# Patient Record
Sex: Male | Born: 1950 | Race: Black or African American | Hispanic: No | Smoking: Never smoker
Health system: Southern US, Community
[De-identification: ages and names within clinical notes are randomized; demographics above are authoritative.]

## PROBLEM LIST (undated history)

## (undated) DIAGNOSIS — K7689 Other specified diseases of liver: Secondary | ICD-10-CM

## (undated) DIAGNOSIS — D638 Anemia in other chronic diseases classified elsewhere: Secondary | ICD-10-CM

## (undated) DIAGNOSIS — I340 Nonrheumatic mitral (valve) insufficiency: Secondary | ICD-10-CM

## (undated) DIAGNOSIS — F1990 Other psychoactive substance use, unspecified, uncomplicated: Secondary | ICD-10-CM

## (undated) DIAGNOSIS — I5022 Chronic systolic (congestive) heart failure: Secondary | ICD-10-CM

## (undated) DIAGNOSIS — I513 Intracardiac thrombosis, not elsewhere classified: Secondary | ICD-10-CM

## (undated) DIAGNOSIS — Z91199 Patient's noncompliance with other medical treatment and regimen due to unspecified reason: Secondary | ICD-10-CM

## (undated) DIAGNOSIS — I480 Paroxysmal atrial fibrillation: Secondary | ICD-10-CM

## (undated) DIAGNOSIS — N186 End stage renal disease: Secondary | ICD-10-CM

## (undated) DIAGNOSIS — I10 Essential (primary) hypertension: Secondary | ICD-10-CM

## (undated) DIAGNOSIS — Z9119 Patient's noncompliance with other medical treatment and regimen: Secondary | ICD-10-CM

## (undated) DIAGNOSIS — E118 Type 2 diabetes mellitus with unspecified complications: Secondary | ICD-10-CM

## (undated) DIAGNOSIS — J189 Pneumonia, unspecified organism: Secondary | ICD-10-CM

## (undated) DIAGNOSIS — M1990 Unspecified osteoarthritis, unspecified site: Secondary | ICD-10-CM

## (undated) DIAGNOSIS — E785 Hyperlipidemia, unspecified: Secondary | ICD-10-CM

## (undated) DIAGNOSIS — Z992 Dependence on renal dialysis: Secondary | ICD-10-CM

## (undated) HISTORY — PX: COLONOSCOPY: SHX174

---

## 2014-06-08 ENCOUNTER — Emergency Department (HOSPITAL_COMMUNITY): Payer: Medicare HMO

## 2014-06-08 ENCOUNTER — Inpatient Hospital Stay (HOSPITAL_COMMUNITY): Payer: Medicare HMO

## 2014-06-08 ENCOUNTER — Inpatient Hospital Stay (HOSPITAL_COMMUNITY)
Admission: EM | Admit: 2014-06-08 | Discharge: 2014-06-28 | DRG: 264 | Disposition: A | Discharge status: Home / Self Care | Payer: Medicare HMO | Attending: Internal Medicine | Admitting: Internal Medicine

## 2014-06-08 ENCOUNTER — Encounter (HOSPITAL_COMMUNITY): Payer: Self-pay | Admitting: Emergency Medicine

## 2014-06-08 DIAGNOSIS — I482 Chronic atrial fibrillation: Secondary | ICD-10-CM | POA: Diagnosis present

## 2014-06-08 DIAGNOSIS — Z79899 Other long term (current) drug therapy: Secondary | ICD-10-CM | POA: Diagnosis not present

## 2014-06-08 DIAGNOSIS — I132 Hypertensive heart and chronic kidney disease with heart failure and with stage 5 chronic kidney disease, or end stage renal disease: Secondary | ICD-10-CM | POA: Diagnosis present

## 2014-06-08 DIAGNOSIS — I340 Nonrheumatic mitral (valve) insufficiency: Secondary | ICD-10-CM | POA: Diagnosis present

## 2014-06-08 DIAGNOSIS — I480 Paroxysmal atrial fibrillation: Secondary | ICD-10-CM | POA: Diagnosis present

## 2014-06-08 DIAGNOSIS — R7881 Bacteremia: Secondary | ICD-10-CM | POA: Diagnosis present

## 2014-06-08 DIAGNOSIS — N2581 Secondary hyperparathyroidism of renal origin: Secondary | ICD-10-CM | POA: Diagnosis present

## 2014-06-08 DIAGNOSIS — R570 Cardiogenic shock: Secondary | ICD-10-CM | POA: Diagnosis present

## 2014-06-08 DIAGNOSIS — N186 End stage renal disease: Secondary | ICD-10-CM | POA: Insufficient documentation

## 2014-06-08 DIAGNOSIS — N170 Acute kidney failure with tubular necrosis: Secondary | ICD-10-CM | POA: Diagnosis present

## 2014-06-08 DIAGNOSIS — E119 Type 2 diabetes mellitus without complications: Secondary | ICD-10-CM | POA: Diagnosis present

## 2014-06-08 DIAGNOSIS — J9601 Acute respiratory failure with hypoxia: Secondary | ICD-10-CM | POA: Diagnosis present

## 2014-06-08 DIAGNOSIS — F1410 Cocaine abuse, uncomplicated: Secondary | ICD-10-CM | POA: Diagnosis present

## 2014-06-08 DIAGNOSIS — R55 Syncope and collapse: Secondary | ICD-10-CM | POA: Diagnosis not present

## 2014-06-08 DIAGNOSIS — Z01818 Encounter for other preprocedural examination: Secondary | ICD-10-CM

## 2014-06-08 DIAGNOSIS — Y848 Other medical procedures as the cause of abnormal reaction of the patient, or of later complication, without mention of misadventure at the time of the procedure: Secondary | ICD-10-CM | POA: Diagnosis present

## 2014-06-08 DIAGNOSIS — D631 Anemia in chronic kidney disease: Secondary | ICD-10-CM | POA: Diagnosis present

## 2014-06-08 DIAGNOSIS — E875 Hyperkalemia: Secondary | ICD-10-CM

## 2014-06-08 DIAGNOSIS — E876 Hypokalemia: Secondary | ICD-10-CM | POA: Diagnosis present

## 2014-06-08 DIAGNOSIS — T80211A Bloodstream infection due to central venous catheter, initial encounter: Secondary | ICD-10-CM | POA: Diagnosis present

## 2014-06-08 DIAGNOSIS — M109 Gout, unspecified: Secondary | ICD-10-CM | POA: Diagnosis present

## 2014-06-08 DIAGNOSIS — I420 Dilated cardiomyopathy: Secondary | ICD-10-CM | POA: Diagnosis present

## 2014-06-08 DIAGNOSIS — I5023 Acute on chronic systolic (congestive) heart failure: Secondary | ICD-10-CM | POA: Diagnosis present

## 2014-06-08 DIAGNOSIS — I240 Acute coronary thrombosis not resulting in myocardial infarction: Secondary | ICD-10-CM | POA: Diagnosis present

## 2014-06-08 DIAGNOSIS — Z9289 Personal history of other medical treatment: Secondary | ICD-10-CM

## 2014-06-08 DIAGNOSIS — E785 Hyperlipidemia, unspecified: Secondary | ICD-10-CM | POA: Diagnosis present

## 2014-06-08 DIAGNOSIS — Z7901 Long term (current) use of anticoagulants: Secondary | ICD-10-CM | POA: Diagnosis not present

## 2014-06-08 DIAGNOSIS — Z992 Dependence on renal dialysis: Secondary | ICD-10-CM

## 2014-06-08 DIAGNOSIS — B9689 Other specified bacterial agents as the cause of diseases classified elsewhere: Secondary | ICD-10-CM | POA: Diagnosis present

## 2014-06-08 DIAGNOSIS — R001 Bradycardia, unspecified: Secondary | ICD-10-CM | POA: Diagnosis present

## 2014-06-08 DIAGNOSIS — E871 Hypo-osmolality and hyponatremia: Secondary | ICD-10-CM | POA: Diagnosis not present

## 2014-06-08 DIAGNOSIS — J811 Chronic pulmonary edema: Secondary | ICD-10-CM

## 2014-06-08 DIAGNOSIS — J9600 Acute respiratory failure, unspecified whether with hypoxia or hypercapnia: Secondary | ICD-10-CM

## 2014-06-08 DIAGNOSIS — T827XXA Infection and inflammatory reaction due to other cardiac and vascular devices, implants and grafts, initial encounter: Secondary | ICD-10-CM

## 2014-06-08 DIAGNOSIS — J9690 Respiratory failure, unspecified, unspecified whether with hypoxia or hypercapnia: Secondary | ICD-10-CM | POA: Diagnosis present

## 2014-06-08 DIAGNOSIS — R0602 Shortness of breath: Secondary | ICD-10-CM | POA: Diagnosis present

## 2014-06-08 DIAGNOSIS — N179 Acute kidney failure, unspecified: Secondary | ICD-10-CM | POA: Insufficient documentation

## 2014-06-08 DIAGNOSIS — Z113 Encounter for screening for infections with a predominantly sexual mode of transmission: Secondary | ICD-10-CM | POA: Insufficient documentation

## 2014-06-08 DIAGNOSIS — N189 Chronic kidney disease, unspecified: Secondary | ICD-10-CM

## 2014-06-08 DIAGNOSIS — IMO0001 Reserved for inherently not codable concepts without codable children: Secondary | ICD-10-CM | POA: Insufficient documentation

## 2014-06-08 HISTORY — DX: Chronic systolic (congestive) heart failure: I50.22

## 2014-06-08 HISTORY — DX: Nonrheumatic mitral (valve) insufficiency: I34.0

## 2014-06-08 HISTORY — DX: Other psychoactive substance use, unspecified, uncomplicated: F19.90

## 2014-06-08 HISTORY — DX: Hyperlipidemia, unspecified: E78.5

## 2014-06-08 HISTORY — DX: Intracardiac thrombosis, not elsewhere classified: I51.3

## 2014-06-08 HISTORY — DX: Essential (primary) hypertension: I10

## 2014-06-08 LAB — PROTIME-INR
INR: 4.58 — ABNORMAL HIGH (ref 0.00–1.49)
INR: 4.75 — ABNORMAL HIGH (ref 0.00–1.49)
INR: 2.79 — ABNORMAL HIGH (ref 0.00–1.49)
PROTHROMBIN TIME: 29.6 s — AB (ref 11.6–15.2)
Prothrombin Time: 43.7 seconds — ABNORMAL HIGH (ref 11.6–15.2)
Prothrombin Time: 44.9 seconds — ABNORMAL HIGH (ref 11.6–15.2)

## 2014-06-08 LAB — I-STAT CHEM 8, ED
BUN: 84 mg/dL — AB (ref 6–23)
CREATININE: 4.6 mg/dL — AB (ref 0.50–1.35)
Calcium, Ion: 1 mmol/L — ABNORMAL LOW (ref 1.13–1.30)
Chloride: 99 mEq/L (ref 96–112)
GLUCOSE: 143 mg/dL — AB (ref 70–99)
HCT: 46 % (ref 39.0–52.0)
Hemoglobin: 15.6 g/dL (ref 13.0–17.0)
Potassium: 6.8 mEq/L (ref 3.7–5.3)
SODIUM: 129 meq/L — AB (ref 137–147)
TCO2: 16 mmol/L (ref 0–100)

## 2014-06-08 LAB — CBC WITH DIFFERENTIAL/PLATELET
Basophils Absolute: 0 10*3/uL (ref 0.0–0.1)
Basophils Relative: 0 % (ref 0–1)
Eosinophils Absolute: 0 10*3/uL (ref 0.0–0.7)
Eosinophils Relative: 0 % (ref 0–5)
HEMATOCRIT: 38.6 % — AB (ref 39.0–52.0)
HEMOGLOBIN: 12 g/dL — AB (ref 13.0–17.0)
LYMPHS ABS: 0.5 10*3/uL — AB (ref 0.7–4.0)
LYMPHS PCT: 7 % — AB (ref 12–46)
MCH: 24.5 pg — ABNORMAL LOW (ref 26.0–34.0)
MCHC: 31.1 g/dL (ref 30.0–36.0)
MCV: 78.8 fL (ref 78.0–100.0)
MONO ABS: 0.6 10*3/uL (ref 0.1–1.0)
MONOS PCT: 8 % (ref 3–12)
NEUTROS ABS: 6.2 10*3/uL (ref 1.7–7.7)
Neutrophils Relative %: 85 % — ABNORMAL HIGH (ref 43–77)
Platelets: 178 10*3/uL (ref 150–400)
RBC: 4.9 MIL/uL (ref 4.22–5.81)
RDW: 20.8 % — AB (ref 11.5–15.5)
WBC: 7.3 10*3/uL (ref 4.0–10.5)

## 2014-06-08 LAB — RENAL FUNCTION PANEL
ALBUMIN: 3.1 g/dL — AB (ref 3.5–5.2)
Albumin: 3.2 g/dL — ABNORMAL LOW (ref 3.5–5.2)
Anion gap: 20 — ABNORMAL HIGH (ref 5–15)
Anion gap: 15 (ref 5–15)
BUN: 85 mg/dL — AB (ref 6–23)
BUN: 65 mg/dL — ABNORMAL HIGH (ref 6–23)
CHLORIDE: 92 meq/L — AB (ref 96–112)
CHLORIDE: 94 meq/L — AB (ref 96–112)
CO2: 19 mEq/L (ref 19–32)
CO2: 22 mEq/L (ref 19–32)
CREATININE: 3.51 mg/dL — AB (ref 0.50–1.35)
Calcium: 10.1 mg/dL (ref 8.4–10.5)
Calcium: 9.5 mg/dL (ref 8.4–10.5)
Creatinine, Ser: 4.44 mg/dL — ABNORMAL HIGH (ref 0.50–1.35)
GFR calc Af Amer: 20 mL/min — ABNORMAL LOW (ref >90)
GFR calc non Af Amer: 13 mL/min — ABNORMAL LOW (ref >90)
GFR, EST AFRICAN AMERICAN: 15 mL/min — AB (ref >90)
GFR, EST NON AFRICAN AMERICAN: 17 mL/min — AB (ref >90)
GLUCOSE: 182 mg/dL — AB (ref 70–99)
Glucose, Bld: 175 mg/dL — ABNORMAL HIGH (ref 70–99)
PHOSPHORUS: 3.8 mg/dL (ref 2.3–4.6)
POTASSIUM: 6.3 meq/L — AB (ref 3.7–5.3)
Phosphorus: 5.2 mg/dL — ABNORMAL HIGH (ref 2.3–4.6)
Potassium: 4.9 mEq/L (ref 3.7–5.3)
SODIUM: 131 meq/L — AB (ref 137–147)
Sodium: 131 mEq/L — ABNORMAL LOW (ref 137–147)

## 2014-06-08 LAB — URINALYSIS, ROUTINE W REFLEX MICROSCOPIC
Glucose, UA: NEGATIVE mg/dL
Ketones, ur: 15 mg/dL — AB
NITRITE: NEGATIVE
PH: 5 (ref 5.0–8.0)
Protein, ur: 30 mg/dL — AB
Specific Gravity, Urine: 1.016 (ref 1.005–1.030)
UROBILINOGEN UA: 2 mg/dL — AB (ref 0.0–1.0)

## 2014-06-08 LAB — MAGNESIUM
MAGNESIUM: 1.8 mg/dL (ref 1.5–2.5)
Magnesium: 1.8 mg/dL (ref 1.5–2.5)

## 2014-06-08 LAB — COMPREHENSIVE METABOLIC PANEL
ALK PHOS: 95 U/L (ref 39–117)
ALT: 66 U/L — ABNORMAL HIGH (ref 0–53)
ALT: 76 U/L — ABNORMAL HIGH (ref 0–53)
ANION GAP: 29 — AB (ref 5–15)
AST: 85 U/L — ABNORMAL HIGH (ref 0–37)
AST: 98 U/L — AB (ref 0–37)
Albumin: 3.4 g/dL — ABNORMAL LOW (ref 3.5–5.2)
Albumin: 3.3 g/dL — ABNORMAL LOW (ref 3.5–5.2)
Alkaline Phosphatase: 94 U/L (ref 39–117)
Anion gap: 30 — ABNORMAL HIGH (ref 5–15)
BILIRUBIN TOTAL: 3.9 mg/dL — AB (ref 0.3–1.2)
BUN: 88 mg/dL — AB (ref 6–23)
BUN: 87 mg/dL — ABNORMAL HIGH (ref 6–23)
CHLORIDE: 88 meq/L — AB (ref 96–112)
CHLORIDE: 89 meq/L — AB (ref 96–112)
CO2: 13 meq/L — AB (ref 19–32)
CO2: 11 meq/L — AB (ref 19–32)
CREATININE: 4.4 mg/dL — AB (ref 0.50–1.35)
Calcium: 9.7 mg/dL (ref 8.4–10.5)
Calcium: 10.8 mg/dL — ABNORMAL HIGH (ref 8.4–10.5)
Creatinine, Ser: 4.36 mg/dL — ABNORMAL HIGH (ref 0.50–1.35)
GFR, EST AFRICAN AMERICAN: 15 mL/min — AB (ref >90)
GFR, EST AFRICAN AMERICAN: 15 mL/min — AB (ref >90)
GFR, EST NON AFRICAN AMERICAN: 13 mL/min — AB (ref >90)
GFR, EST NON AFRICAN AMERICAN: 13 mL/min — AB (ref >90)
GLUCOSE: 147 mg/dL — AB (ref 70–99)
GLUCOSE: 199 mg/dL — AB (ref 70–99)
POTASSIUM: 7.1 meq/L — AB (ref 3.7–5.3)
Potassium: 7 mEq/L (ref 3.7–5.3)
Sodium: 130 mEq/L — ABNORMAL LOW (ref 137–147)
Sodium: 130 mEq/L — ABNORMAL LOW (ref 137–147)
Total Bilirubin: 3.8 mg/dL — ABNORMAL HIGH (ref 0.3–1.2)
Total Protein: 8.6 g/dL — ABNORMAL HIGH (ref 6.0–8.3)
Total Protein: 8.4 g/dL — ABNORMAL HIGH (ref 6.0–8.3)

## 2014-06-08 LAB — BLOOD GAS, ARTERIAL
Acid-base deficit: 9.5 mmol/L — ABNORMAL HIGH (ref 0.0–2.0)
BICARBONATE: 15.8 meq/L — AB (ref 20.0–24.0)
Drawn by: 406621
FIO2: 0.6 %
MECHVT: 620 mL
O2 Saturation: 99.1 %
PEEP/CPAP: 5 cmH2O
PH ART: 7.298 — AB (ref 7.350–7.450)
PO2 ART: 197 mmHg — AB (ref 80.0–100.0)
Patient temperature: 97.5
RATE: 16 resp/min
TCO2: 16.8 mmol/L (ref 0–100)
pCO2 arterial: 32.9 mmHg — ABNORMAL LOW (ref 35.0–45.0)

## 2014-06-08 LAB — TYPE AND SCREEN
ABO/RH(D): A POS
Antibody Screen: NEGATIVE
UNIT DIVISION: 0
Unit division: 0
Unit division: 0
Unit division: 0

## 2014-06-08 LAB — CBC
HEMATOCRIT: 36.9 % — AB (ref 39.0–52.0)
Hemoglobin: 11.6 g/dL — ABNORMAL LOW (ref 13.0–17.0)
MCH: 24.1 pg — AB (ref 26.0–34.0)
MCHC: 31.4 g/dL (ref 30.0–36.0)
MCV: 76.6 fL — ABNORMAL LOW (ref 78.0–100.0)
Platelets: 202 10*3/uL (ref 150–400)
RBC: 4.82 MIL/uL (ref 4.22–5.81)
RDW: 20.2 % — AB (ref 11.5–15.5)
WBC: 7.2 10*3/uL (ref 4.0–10.5)

## 2014-06-08 LAB — PRO B NATRIURETIC PEPTIDE: Pro B Natriuretic peptide (BNP): 11335 pg/mL — ABNORMAL HIGH (ref 0–125)

## 2014-06-08 LAB — MRSA PCR SCREENING: MRSA by PCR: NEGATIVE

## 2014-06-08 LAB — I-STAT TROPONIN, ED: Troponin i, poc: 0.03 ng/mL (ref 0.00–0.08)

## 2014-06-08 LAB — TROPONIN I: Troponin I: <0.3 ng/mL (ref <0.30)

## 2014-06-08 LAB — POCT ACTIVATED CLOTTING TIME
ACTIVATED CLOTTING TIME: 135 s
Activated Clotting Time: 146 seconds
Activated Clotting Time: 163 seconds
Activated Clotting Time: 157 seconds
Activated Clotting Time: 152 seconds
Activated Clotting Time: 163 seconds
Activated Clotting Time: 157 seconds
Activated Clotting Time: 168 seconds

## 2014-06-08 LAB — CBG MONITORING, ED
Glucose-Capillary: 154 mg/dL — ABNORMAL HIGH (ref 70–99)
Glucose-Capillary: 185 mg/dL — ABNORMAL HIGH (ref 70–99)

## 2014-06-08 LAB — LACTIC ACID, PLASMA: LACTIC ACID, VENOUS: 10.7 mmol/L — AB (ref 0.5–2.2)

## 2014-06-08 LAB — CARBOXYHEMOGLOBIN
Carboxyhemoglobin: 1.1 % (ref 0.5–1.5)
METHEMOGLOBIN: 1 % (ref 0.0–1.5)
O2 Saturation: 49.1 %
TOTAL HEMOGLOBIN: 12 g/dL — AB (ref 13.5–18.0)

## 2014-06-08 LAB — URINE MICROSCOPIC-ADD ON

## 2014-06-08 LAB — APTT: APTT: 43 s — AB (ref 24–37)

## 2014-06-08 LAB — ABO/RH: ABO/RH(D): A POS

## 2014-06-08 LAB — GLUCOSE, CAPILLARY
Glucose-Capillary: 159 mg/dL — ABNORMAL HIGH (ref 70–99)
Glucose-Capillary: 172 mg/dL — ABNORMAL HIGH (ref 70–99)

## 2014-06-08 LAB — PHOSPHORUS: Phosphorus: 5.5 mg/dL — ABNORMAL HIGH (ref 2.3–4.6)

## 2014-06-08 MED ORDER — SUCCINYLCHOLINE CHLORIDE 20 MG/ML IJ SOLN
INTRAMUSCULAR | Status: AC
  Filled 2014-06-08: qty 1

## 2014-06-08 MED ORDER — MIDAZOLAM HCL 2 MG/2ML IJ SOLN
1.0000 mg | INTRAMUSCULAR | Status: DC | PRN
  Administered 2014-06-08: 1 mg via INTRAVENOUS

## 2014-06-08 MED ORDER — CALCIUM CHLORIDE 10 % IV SOLN
INTRAVENOUS | Status: AC
  Administered 2014-06-08: 1000 mg
  Filled 2014-06-08: qty 10

## 2014-06-08 MED ORDER — ROCURONIUM BROMIDE 50 MG/5ML IV SOLN
INTRAVENOUS | Status: AC
  Filled 2014-06-08: qty 2

## 2014-06-08 MED ORDER — INSULIN ASPART 100 UNIT/ML ~~LOC~~ SOLN
10.0000 [IU] | Freq: Once | SUBCUTANEOUS | Status: AC
  Administered 2014-06-08: 10 [IU] via INTRAVENOUS
  Filled 2014-06-08: qty 1

## 2014-06-08 MED ORDER — PRISMASOL BGK 0/2.5 32-2.5 MEQ/L IV SOLN
INTRAVENOUS | Status: DC
  Administered 2014-06-08 – 2014-06-09 (×6): via INTRAVENOUS_CENTRAL
  Filled 2014-06-08 (×11): qty 5000

## 2014-06-08 MED ORDER — FUROSEMIDE 10 MG/ML IJ SOLN
160.0000 mg | Freq: Once | INTRAVENOUS | Status: AC
  Administered 2014-06-08: 160 mg via INTRAVENOUS
  Filled 2014-06-08: qty 16

## 2014-06-08 MED ORDER — LIDOCAINE HCL (CARDIAC) 20 MG/ML IV SOLN
INTRAVENOUS | Status: AC
  Filled 2014-06-08: qty 5

## 2014-06-08 MED ORDER — ETOMIDATE 2 MG/ML IV SOLN
INTRAVENOUS | Status: AC | PRN
  Administered 2014-06-08: 20 mg via INTRAVENOUS

## 2014-06-08 MED ORDER — VECURONIUM BROMIDE 10 MG IV SOLR
INTRAVENOUS | Status: AC
  Administered 2014-06-08: 5 mg
  Filled 2014-06-08: qty 10

## 2014-06-08 MED ORDER — SODIUM CHLORIDE 0.9 % IV SOLN
25.0000 ug/h | INTRAVENOUS | Status: DC
  Administered 2014-06-09: 200 ug/h via INTRAVENOUS
  Administered 2014-06-10: 100 ug/h via INTRAVENOUS
  Filled 2014-06-08 (×3): qty 50

## 2014-06-08 MED ORDER — PRISMASOL BGK 0/2.5 32-2.5 MEQ/L IV SOLN
INTRAVENOUS | Status: DC
  Administered 2014-06-08 (×2): via INTRAVENOUS_CENTRAL
  Filled 2014-06-08 (×6): qty 5000

## 2014-06-08 MED ORDER — CHLORHEXIDINE GLUCONATE 0.12 % MT SOLN
15.0000 mL | Freq: Two times a day (BID) | OROMUCOSAL | Status: DC
  Administered 2014-06-08 – 2014-06-09 (×3): 15 mL via OROMUCOSAL
  Filled 2014-06-08 (×3): qty 15

## 2014-06-08 MED ORDER — SODIUM POLYSTYRENE SULFONATE 15 GM/60ML PO SUSP
30.0000 g | Freq: Once | ORAL | Status: AC
  Administered 2014-06-08: 30 g via ORAL
  Filled 2014-06-08: qty 120

## 2014-06-08 MED ORDER — SODIUM CHLORIDE 0.9 % IV SOLN
1.0000 mg/h | INTRAVENOUS | Status: DC
  Administered 2014-06-08 (×2): 2 mg/h via INTRAVENOUS
  Filled 2014-06-08 (×2): qty 10

## 2014-06-08 MED ORDER — CALCIUM CHLORIDE 10 % IV SOLN
INTRAVENOUS | Status: AC | PRN
  Administered 2014-06-08: 1 g via INTRAVENOUS

## 2014-06-08 MED ORDER — DOBUTAMINE IN D5W 4-5 MG/ML-% IV SOLN: 2.5000 ug/kg/min | INTRAVENOUS | Status: DC

## 2014-06-08 MED ORDER — DOBUTAMINE IN D5W 4-5 MG/ML-% IV SOLN
5.0000 ug/kg/min | INTRAVENOUS | Status: DC
  Administered 2014-06-08 – 2014-06-09 (×2): 5 ug/kg/min via INTRAVENOUS
  Filled 2014-06-08 (×2): qty 250

## 2014-06-08 MED ORDER — NEPRO/CARBSTEADY PO LIQD
1000.0000 mL | ORAL | Status: DC
  Administered 2014-06-08: 1000 mL via ORAL
  Filled 2014-06-08 (×3): qty 1000

## 2014-06-08 MED ORDER — PRO-STAT SUGAR FREE PO LIQD
60.0000 mL | Freq: Four times a day (QID) | ORAL | Status: DC
  Administered 2014-06-08 – 2014-06-09 (×6): 60 mL
  Filled 2014-06-08 (×11): qty 60

## 2014-06-08 MED ORDER — SODIUM CHLORIDE 0.9 % IV SOLN
10.0000 mL/h | Freq: Once | INTRAVENOUS | Status: AC
  Administered 2014-06-08: 10 mL/h via INTRAVENOUS

## 2014-06-08 MED ORDER — INSULIN ASPART 100 UNIT/ML ~~LOC~~ SOLN
2.0000 [IU] | SUBCUTANEOUS | Status: DC
  Administered 2014-06-08 – 2014-06-09 (×3): 4 [IU] via SUBCUTANEOUS
  Administered 2014-06-09: 2 [IU] via SUBCUTANEOUS
  Administered 2014-06-09 (×2): 4 [IU] via SUBCUTANEOUS
  Administered 2014-06-09: 2 [IU] via SUBCUTANEOUS
  Administered 2014-06-09 – 2014-06-10 (×2): 4 [IU] via SUBCUTANEOUS
  Administered 2014-06-10 (×2): 2 [IU] via SUBCUTANEOUS
  Administered 2014-06-10 – 2014-06-11 (×3): 4 [IU] via SUBCUTANEOUS
  Administered 2014-06-11 (×2): 2 [IU] via SUBCUTANEOUS
  Administered 2014-06-12: 4 [IU] via SUBCUTANEOUS
  Administered 2014-06-12: 6 [IU] via SUBCUTANEOUS
  Administered 2014-06-12 – 2014-06-14 (×6): 2 [IU] via SUBCUTANEOUS
  Administered 2014-06-14 (×2): 4 [IU] via SUBCUTANEOUS
  Administered 2014-06-14: 2 [IU] via SUBCUTANEOUS
  Administered 2014-06-14: 4 [IU] via SUBCUTANEOUS
  Administered 2014-06-14: 2 [IU] via SUBCUTANEOUS
  Administered 2014-06-15 – 2014-06-16 (×5): 4 [IU] via SUBCUTANEOUS
  Administered 2014-06-17 (×2): 2 [IU] via SUBCUTANEOUS
  Administered 2014-06-17: 6 [IU] via SUBCUTANEOUS

## 2014-06-08 MED ORDER — MIDAZOLAM HCL 2 MG/2ML IJ SOLN
INTRAMUSCULAR | Status: AC
  Filled 2014-06-08: qty 4

## 2014-06-08 MED ORDER — FUROSEMIDE 10 MG/ML IJ SOLN
160.0000 mg | Freq: Four times a day (QID) | INTRAVENOUS | Status: AC
  Administered 2014-06-08 – 2014-06-10 (×6): 160 mg via INTRAVENOUS
  Filled 2014-06-08 (×7): qty 16

## 2014-06-08 MED ORDER — FENTANYL CITRATE 0.05 MG/ML IJ SOLN
50.0000 ug | Freq: Once | INTRAMUSCULAR | Status: AC
  Administered 2014-06-08: 50 ug via INTRAVENOUS
  Filled 2014-06-08: qty 2

## 2014-06-08 MED ORDER — SODIUM CHLORIDE 0.9 % IJ SOLN
250.0000 [IU]/h | INTRAMUSCULAR | Status: DC
  Administered 2014-06-08: 450 [IU]/h via INTRAVENOUS_CENTRAL
  Administered 2014-06-09 (×2): 1700 [IU]/h via INTRAVENOUS_CENTRAL
  Administered 2014-06-09: 1650 [IU]/h via INTRAVENOUS_CENTRAL
  Administered 2014-06-09: 1350 [IU]/h via INTRAVENOUS_CENTRAL
  Administered 2014-06-10: 1650 [IU]/h via INTRAVENOUS_CENTRAL
  Administered 2014-06-10: 1700 [IU]/h via INTRAVENOUS_CENTRAL
  Administered 2014-06-10: 1450 [IU]/h via INTRAVENOUS_CENTRAL
  Administered 2014-06-10: 1700 [IU]/h via INTRAVENOUS_CENTRAL
  Administered 2014-06-11 (×4): 1350 [IU]/h via INTRAVENOUS_CENTRAL
  Administered 2014-06-12 (×2): 1250 [IU]/h via INTRAVENOUS_CENTRAL
  Administered 2014-06-12: 1300 [IU]/h via INTRAVENOUS_CENTRAL
  Administered 2014-06-13: 1350 [IU]/h via INTRAVENOUS_CENTRAL
  Administered 2014-06-13: 950 [IU]/h via INTRAVENOUS_CENTRAL
  Administered 2014-06-13: 1350 [IU]/h via INTRAVENOUS_CENTRAL
  Administered 2014-06-14 – 2014-06-15 (×2): 550 [IU]/h via INTRAVENOUS_CENTRAL
  Administered 2014-06-16: 600 [IU]/h via INTRAVENOUS_CENTRAL
  Administered 2014-06-17: 650 [IU]/h via INTRAVENOUS_CENTRAL
  Administered 2014-06-17: 600 [IU]/h via INTRAVENOUS_CENTRAL
  Administered 2014-06-18: 650 [IU]/h via INTRAVENOUS_CENTRAL
  Administered 2014-06-18: 1450 [IU]/h via INTRAVENOUS_CENTRAL
  Administered 2014-06-19: 1600 [IU]/h via INTRAVENOUS_CENTRAL
  Administered 2014-06-19: 850 [IU]/h via INTRAVENOUS_CENTRAL
  Administered 2014-06-19: 1250 [IU]/h via INTRAVENOUS_CENTRAL
  Filled 2014-06-08 (×31): qty 2

## 2014-06-08 MED ORDER — ROCURONIUM BROMIDE 50 MG/5ML IV SOLN
INTRAVENOUS | Status: AC | PRN
  Administered 2014-06-08: 50 mg via INTRAVENOUS

## 2014-06-08 MED ORDER — PRISMASOL BGK 0/2.5 32-2.5 MEQ/L IV SOLN
INTRAVENOUS | Status: DC
  Administered 2014-06-08 – 2014-06-09 (×3): via INTRAVENOUS_CENTRAL
  Filled 2014-06-08 (×6): qty 5000

## 2014-06-08 MED ORDER — CETYLPYRIDINIUM CHLORIDE 0.05 % MT LIQD
7.0000 mL | Freq: Four times a day (QID) | OROMUCOSAL | Status: DC
  Administered 2014-06-09 – 2014-06-10 (×8): 7 mL via OROMUCOSAL

## 2014-06-08 MED ORDER — HEPARIN SODIUM (PORCINE) 1000 UNIT/ML DIALYSIS
1000.0000 [IU] | INTRAMUSCULAR | Status: DC | PRN
  Administered 2014-06-08: 3200 [IU] via INTRAVENOUS_CENTRAL
  Filled 2014-06-08: qty 4
  Filled 2014-06-08: qty 6

## 2014-06-08 MED ORDER — FENTANYL CITRATE 0.05 MG/ML IJ SOLN
INTRAMUSCULAR | Status: AC | PRN
  Administered 2014-06-08: 100 ug via INTRAVENOUS

## 2014-06-08 MED ORDER — MIDAZOLAM HCL 2 MG/2ML IJ SOLN: 1.0000 mg | INTRAMUSCULAR | Status: DC | PRN

## 2014-06-08 MED ORDER — FENTANYL CITRATE 0.05 MG/ML IJ SOLN
0.0000 ug/h | INTRAMUSCULAR | Status: DC
  Administered 2014-06-08: 50 ug/h via INTRAVENOUS
  Filled 2014-06-08: qty 50

## 2014-06-08 MED ORDER — PANTOPRAZOLE SODIUM 40 MG IV SOLR
40.0000 mg | INTRAVENOUS | Status: DC
  Administered 2014-06-08: 40 mg via INTRAVENOUS
  Filled 2014-06-08: qty 40

## 2014-06-08 MED ORDER — DEXTROSE 50 % IV SOLN
1.0000 | Freq: Once | INTRAVENOUS | Status: AC
  Administered 2014-06-08: 50 mL via INTRAVENOUS
  Filled 2014-06-08: qty 50

## 2014-06-08 MED ORDER — FUROSEMIDE 10 MG/ML IJ SOLN
INTRAMUSCULAR | Status: AC
  Administered 2014-06-08: 80 mg
  Filled 2014-06-08: qty 8

## 2014-06-08 MED ORDER — FENTANYL BOLUS VIA INFUSION
25.0000 ug | INTRAVENOUS | Status: DC | PRN
  Administered 2014-06-08 – 2014-06-09 (×4): 50 ug via INTRAVENOUS
  Filled 2014-06-08: qty 50

## 2014-06-08 MED ORDER — FENTANYL CITRATE 0.05 MG/ML IJ SOLN
INTRAMUSCULAR | Status: AC
  Administered 2014-06-08: 50 ug via INTRAVENOUS
  Filled 2014-06-08: qty 2

## 2014-06-08 MED ORDER — ETOMIDATE 2 MG/ML IV SOLN
INTRAVENOUS | Status: AC
  Filled 2014-06-08: qty 20

## 2014-06-08 MED ORDER — SODIUM CHLORIDE 0.9 % IV SOLN
1.0000 g | Freq: Once | INTRAVENOUS | Status: DC
  Filled 2014-06-08: qty 10

## 2014-06-08 MED ORDER — HEPARIN BOLUS VIA INFUSION (CRRT)
1000.0000 [IU] | INTRAVENOUS | Status: DC | PRN
  Filled 2014-06-08 (×2): qty 1000

## 2014-06-08 MED ORDER — SODIUM CHLORIDE 0.9 % FOR CRRT
INTRAVENOUS_CENTRAL | Status: DC | PRN
  Filled 2014-06-08: qty 1000

## 2014-06-08 MED ORDER — MIDAZOLAM HCL 5 MG/5ML IJ SOLN
INTRAMUSCULAR | Status: AC | PRN
  Administered 2014-06-08: 2 mg via INTRAVENOUS

## 2014-06-08 NOTE — ED Notes (Signed)
MD at bedside. 

## 2014-06-08 NOTE — Progress Notes (Signed)
INITIAL NUTRITION ASSESSMENT  DOCUMENTATION CODES Per approved criteria  -Obesity Unspecified   INTERVENTION: Initiate TF via OGT with Nepro at 25 ml/h and Prostat 60 ml 4 times daily on day 1; on day 2, increase to goal rate of 25 ml/h (600 ml per day) to provide 1880 kcals, 169 gm protein, 436 ml free water daily.  NUTRITION DIAGNOSIS: Inadequate oral intake related to inability to eat as evidenced by NPO.   Goal: Enteral nutrition to provide 60-70% of estimated calorie needs (22-25 kcals/kg ideal body weight) and 100% of estimated protein needs, based on ASPEN guidelines for hypocaloric, high protein feeding in critically ill obese individuals  Monitor:  TF tolerance/initiation/ advancement, respiratory status, labs, weight changes, I/O's  Reason for Assessment: VDRF, Consult for TF initiation and mangement  63 y.o. male  Admitting Dx: <principal problem not specified>  75 year old with end stage CHF with EF of 15% presenting with hyperkalemia, pulmonary edema, respiratory failure and renal failure. Unable to speak in full sentences. Minimal history. Presents with SOB, respiratory failure and severe anasarca.  ASSESSMENT: Pt admitted with VDRF due to pulmonary edema. HD catheter to be placed to start HD.  Unable to complete nutrition focused physical exam at this time. MD and RN currently at bedside. Family does not desire long term life support. Pt with hx of cocaine abuse.  Patient is currently intubated on ventilator support. OGT in place.  MV: 9.2 L/min Temp (24hrs), Avg:97.5 F (36.4 C), Min:97.4 F (36.3 C), Max:97.7 F (36.5 C)  Labs reviewed. Na: 130, K: 7.0, Cl: 89, CO2: 11, BUN/Creat: 87/4.40, Ca: 10.8, Phos: 5.5, Glucose:199, CBGs: 154-185. Mg WDL.   Height: Ht Readings from Last 1 Encounters:  06/08/14 6' (1.829 m)    Weight: Wt Readings from Last 1 Encounters:  06/08/14 280 lb (127.007 kg)    Ideal Body Weight: 178# (80.9 kg)  % Ideal Body Weight:  157%  Wt Readings from Last 10 Encounters:  06/08/14 280 lb (127.007 kg)    Usual Body Weight: unknown  % Usual Body Weight: n/a  BMI:  Body mass index is 37.97 kg/(m^2). Obesity, class II.   Estimated Nutritional Needs: Kcal: 2095.3 Kcals per MASPEN Underfeeding Protocol: 1517-6160 Protein: >162 grams Fluid: >1.8 L  Skin: Intact  Diet Order: NPO  EDUCATION NEEDS: -Education not appropriate at this time   Intake/Output Summary (Last 24 hours) at 06/08/14 1358 Last data filed at 06/08/14 1355  Gross per 24 hour  Intake    617 ml  Output      0 ml  Net    617 ml    Last BM: PTA  Labs:   Recent Labs Lab 06/08/14 1040 06/08/14 1121 06/08/14 1216  NA 130* 129* 130*  K 7.1* 6.8* 7.0*  CL 88* 99 89*  CO2 13*  --  11*  BUN 88* 84* 87*  CREATININE 4.36* 4.60* 4.40*  CALCIUM 9.7  --  10.8*  MG  --   --  1.8  PHOS  --   --  5.5*  GLUCOSE 147* 143* 199*    CBG (last 3)   Recent Labs  06/08/14 1013 06/08/14 1250  GLUCAP 154* 185*   Scheduled Meds: . sodium chloride  10 mL/hr Intravenous Once  . calcium gluconate 1 GM IV  1 g Intravenous Once  . furosemide  160 mg Intravenous Once  . furosemide  160 mg Intravenous Q6H  . insulin aspart  2-6 Units Subcutaneous 6 times per day  .  lidocaine (cardiac) 100 mg/415ml      . pantoprazole (PROTONIX) IV  40 mg Intravenous Q24H  . succinylcholine        Continuous Infusions: . DOBUTamine    . fentaNYL infusion INTRAVENOUS    . heparin 10,000 units/ 20 mL infusion syringe    . midazolam (VERSED) infusion 2 mg/hr (06/08/14 1341)  . dialysis replacement fluid (prismasate)    . dialysis replacement fluid (prismasate)    . dialysate [facility](PRISMASATE)      Past Medical History  Diagnosis Date  . Diabetes mellitus without complication   . Chronic systolic heart failure     a) ECHO [facility] 02/2014): EF <15%, multiple apical thrombi, RV mod enlarged, mod MR b. [facility]: RA 16, RV 64/9 (15), PA 64/32 (42),  PCWP: 24, CI: 1.4  . HTN (hypertension)   . HLD (hyperlipidemia)   . CKD (chronic kidney disease), stage III   . Apical mural thrombus   . Mitral regurgitation   . Drug use     cocaine  . Atrial fibrillation, chronic     History reviewed. No pertinent past surgical history.  Icie Kuznicki A. Mayford KnifeWilliams, RD, LDN Pager: [phone] After hours Pager: [phone]

## 2014-06-08 NOTE — ED Notes (Signed)
Dr. Clarise Cruz at bedside for eval

## 2014-06-08 NOTE — ED Notes (Signed)
Pt became dizzy and pulling leads and oxygen off, md wong called to bedside.

## 2014-06-08 NOTE — Consult Note (Signed)
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 Maintained on dobutamine 675mcg/kg/min.   Past medical h/o is notable for CKD with baseline Cr 3.0, cocaine abuse, DM2, AF on amio, VT. He has not been candidate for advanced therapies due to cocaine abuse (quit 4 months ago) and renal failure. Started on dobutamine. [place] to stay off cocaine.  On Oct 14 [facility]. CR was 3.3 (on 04/16/2014 Cr [facility]) and potassium was low. Kcl was increased. Over past week increased dyspnea and edema. NYHA IIIB sym;toms at baseline.  On arrival to ER was markedly dyspneic and with wide complex bradycardia. K 6.8 and Cr 4.6. Given IV calcium and IV lasix. CCM called and he ws intubated.      Review of Systems: [y] = yes, [ ]  = no   General: Weight gain [ y]; Weight loss [ ] ; Anorexia [ ] ; Fatigue[ y]; Fever [ ] ; Chills [ ] ; Weakness [ ]   Cardiac: Chest pain/pressure [ ] ; Resting SOB Cove.Etienne[y ]; Exertional SOB Cove.Etienne[y ]; Orthopnea Cove.Etienne[y ]; Pedal Edema [ y]; Palpitations [ ] ; Syncope [ ] ; Presyncope [ ] ; Paroxysmal nocturnal dyspnea[ ]   Pulmonary: Cough Cove.Etienne[y ]; Wheezing[ ] ; Hemoptysis[ ] ; Sputum [ ] ; Snoring [ ]   GI: Vomiting[ ] ; Dysphagia[ ] ; Melena[ ] ; Hematochezia [ ] ; Heartburn[ ] ; Abdominal pain Cove.Etienne[y ]; Constipation [ ] ; Diarrhea [ ] ; BRBPR [ ]   GU: Hematuria[ ] ; Dysuria [ ] ; Nocturia[ ]   Vascular: Pain in legs with walking [ ] ; Pain in feet with lying flat [ ] ; Non-healing sores [ ] ; Stroke [ ] ; TIA [ ] ; Slurred speech [ ] ;  Neuro: Headaches[ ] ; Vertigo[ ] ; Seizures[ ] ; Paresthesias[ ] ;Blurred vision [ ] ; Diplopia [ ] ; Vision changes [ ]   Ortho/Skin: Arthritis Cove.Etienne[y ]; Joint pain Cove.Etienne[y ]; Muscle pain [ ] ; Joint swelling [ ] ; Back Pain [ ] ; Rash [ ]   Psych: Depression[ ] ; Anxiety[ ]   Heme: Bleeding  problems [ ] ; Clotting disorders [ ] ; Anemia [ ]   Endocrine: Diabetes Cove.Etienne[y ]; Thyroid dysfunction[ ]   Home Medications Prior to Admission medications   Not on File    Past Medical History: Past Medical History  Diagnosis Date  . Diabetes mellitus without complication   . Chronic systolic heart failure     a) [facility]CHO [facility] 02/2014): EF <15%, multiple apical thrombi, RV mod enlarged, mod MR b. [facility]: RA 16, RV 64/9 (15), PA 64/32 (42), PCWP: 24, CI: 1.4  . HTN (hypertension)   . HLD (hyperlipidemia)   . CKD (chronic kidney disease), stage III   . Apical mural thrombus   . Mitral regurgitation   . Drug use     cocaine  . Atrial fibrillation, chronic   History reviewed. No pertinent family history. No FHx of premature CAD or HF.   Social History: History   Social History  . Marital Status: Divorced    Spouse Name: N/A    Number of Children: N/A  . Years of Education: N/A   Social History Main Topics  . Smoking status: Unknown If Ever Smoked  . Smokeless tobacco: None  . Alcohol Use: None  . Drug Use: Yes     Comment: hx cocaine use  . Sexual Activity: None  Other Topics Concern  . None   Social History Narrative   Respiratory therapist    Allergies:  No Known Allergies  Objective:    Vital Signs:   Temp:  [97.4 F (36.3 C)] 97.4 F (36.3 C) (10/30 0957) Pulse Rate:  [88-106] 95 (10/30 1130) Resp:  [17-32] 22 (10/30 1130) BP: (70-134)/(55-98) 112/98 mmHg (10/30 1130) SpO2:  [98 %-100 %] 99 % (10/30 1130) Weight:  [127.007 kg (280 lb)] 127.007 kg (280 lb) (10/30 1002)    Weight change: Filed Weights   06/08/14 1002  Weight: 127.007 kg (280 lb)    Intake/Output:  No intake or output data in the 24 hours ending 06/08/14 1156   Physical Exam: General:  Chronically ill appearing. Severe dyspnea using accessory muscles to breathe HEENT: normal Neck: supple. JVP 10 . Carotids 2+ bilat; no bruits. No lymphadenopathy or thryomegaly  appreciated. Cor: PMI laterally displaced. Regular rate & rhythm. 2/6 MR +s3 Lungs: + craclkes Abdomen: soft, nontender, + distended. No hepatosplenomegaly. No bruits or masses. Good bowel sounds. Extremities: no cyanosis, clubbing, rash, 2+ edema Neuro: alert & orientedx3, cranial nerves grossly intact. moves all 4 extremities w/o difficulty. Affect pleasant  Telemetry: ?SR with wide complex brady  Labs: Basic Metabolic Panel:  Recent Labs Lab 06/08/14 1040 06/08/14 1121  NA 130* 129*  K 7.1* 6.8*  CL 88* 99  CO2 13*  --   GLUCOSE 147* 143*  BUN 88* 84*  CREATININE 4.36* 4.60*  CALCIUM 9.7  --     Liver Function Tests:  Recent Labs Lab 06/08/14 1040  AST 85*  ALT 66*  ALKPHOS 95  BILITOT 3.8*  PROT 8.6*  ALBUMIN 3.4*   No results found for this basename: LIPASE, AMYLASE,  in the last 168 hours No results found for this basename: AMMONIA,  in the last 168 hours  CBC:  Recent Labs Lab 06/08/14 1040 06/08/14 1121  WBC 7.2  --   HGB 11.6* 15.6  HCT 36.9* 46.0  MCV 76.6*  --   PLT 202  --     Cardiac Enzymes:  Recent Labs Lab 06/08/14 1040  TROPONINI <0.30    BNP: BNP (last 3 results)  Recent Labs  06/08/14 1040  PROBNP 11335.0*    CBG:  Recent Labs Lab 06/08/14 1013  GLUCAP 154*    Coagulation Studies:  Recent Labs  06/08/14 1040  LABPROT 43.7*  INR 4.58*    Other results: EKG: ?SR with wide complex bradycardia c/w hyperkalemia  Imaging: Dg Chest Port 1 View  06/08/2014   CLINICAL DATA:  Shortness of Breath beginning last night with nausea starting this morning. Initial encounter.  EXAM: PORTABLE CHEST - 1 VIEW  COMPARISON:  None.  FINDINGS: 1023 hrs. Right PICC line tip projects at innominate vein confluence. Lungs are clear without edema or focal airspace consolidation. No pleural effusion. The cardio pericardial silhouette is enlarged. Imaged bony structures of the thorax are intact. Telemetry leads overlie the chest.   IMPRESSION: Enlargement of the cardiopericardial silhouette without edema or focal airspace consolidation.   Electronically Signed   By: Kennith Center M.D.   On: 06/08/2014 10:59     Medications:     Current Medications: . etomidate      . fentaNYL      . lidocaine (cardiac) 100 mg/47ml      . midazolam      . rocuronium      . sodium polystyrene  30 g Oral Once  . succinylcholine  Infusions: . calcium gluconate 1 GM IV    . DOBUTamine       Assessment:   1. Acute respiratory failure 2. A/c systolic HF due to NICM EF  < 74% 3. Acute on chronic renal failure stage IV 4. Hyperkalemia 5. Cardiogenic shock 6. DM2 7. Cocaine abuse in remission 3-4 months 8. H/o gout 9. Supratherapeutic INR  10. LV thrombus 11. PAF on amio 12. H/o VT   Plan/Discussion:    He is critically ill with acute renal and respiratory failure in the setting of advanced HF/cardiogenic shock. This is complicated by severe hyperkalemia. He has not been a candidate for advanced cardiac therapies due to renal failure and cocaine abuse.   I discussed his situation with him with Dr. Molli Knock at bedside as well. Options are very limited as he cannot get advanced therapies and wouldn't be candidate for outpatient HD. For now he wants to be aggressive and is ok with intubation and CVVHD short-term. Would not want long-term intubation, PEG, trach or frequent shocks.  Will treat hyperkalemia with IV calciumt, kayexalate, insulin D50. CCM and Renal have been notified.   I discussed case with his primary cardiologist, Dr. Lupita Shutter at [facility] who agrees.  The patient is critically ill with multiple organ systems failure and requires high complexity decision making for assessment and support, frequent evaluation and titration of therapies, application of advanced monitoring technologies and extensive interpretation of multiple databases.   Critical Care Time devoted to patient care services described in this note  is 60 Minutes.   Length of Stay: 0  Arvilla Meres MD 06/08/2014, 11:56 AM  Advanced Heart Failure Team Pager [phone] (M-F; 7a - 4p)  Please contact [facility] for night-coverage after hours (4p -7a ) and weekends on [email or web address]

## 2014-06-08 NOTE — Procedures (Signed)
Hemodialysis Insertion Procedure Note Soul Companion [id number] 26-Nov-1950  Procedure: Insertion of Hemodialysis Catheter Type: 3 port  Indications: Hemodialysis   Procedure Details Consent: Risks of procedure as well as the alternatives and risks of each were explained to the (patient/caregiver).  Consent for procedure obtained. Time Out: Verified patient identification, verified procedure, site/side was marked, verified correct patient position, special equipment/implants available, medications/allergies/relevent history reviewed, required imaging and test results available.  Performed  Maximum sterile technique was used including antiseptics, cap, gloves, gown, hand hygiene, mask and sheet. Skin prep: Chlorhexidine; local anesthetic administered A antimicrobial bonded/coated triple lumen catheter was placed in the right femoral vein due to patient being a dialysis patient using the Seldinger technique. Ultrasound guidance used.Yes.   Catheter placed to 20 cm. Blood aspirated via all 3 ports and then flushed x 3. Line sutured x 2 and dressing applied.  Evaluation Blood flow good Complications: No apparent complications Patient did tolerate procedure well. Chest X-ray ordered to verify placement.  CXR: pending.  Brett Canales Minor ACNP Adolph Pollack PCCM Pager [phone] till 3 pm If no answer page [phone] 06/08/2014, 2:49 PM  I was present for and supervised the entire procedure  Billy Fischer, MD ; [facility] service Mobile [phone].  After 5:30 PM or weekends, call [phone]

## 2014-06-08 NOTE — Progress Notes (Signed)
ANTICOAGULATION CONSULT NOTE - Initial Consult  Pharmacy Consult for heparin Indication: atrial fibrillation and apical thrombus  No Known Allergies  Patient Measurements: Height: 6' (182.9 cm) Weight: 280 lb (127.007 kg) IBW/kg (Calculated) : 77.6 Heparin Dosing Weight: 92kg  Vital Signs: Temp: 97.2 F (36.2 C) (10/30 1420) Temp Source: Axillary (10/30 1420) BP: 114/68 mmHg (10/30 1420) Pulse Rate: 98 (10/30 1420)  Labs:  Recent Labs  06/08/14 1040 06/08/14 1121 06/08/14 1216  HGB 11.6* 15.6 12.0*  HCT 36.9* 46.0 38.6*  PLT 202  --  178  APTT  --   --  43*  LABPROT 43.7*  --  44.9*  INR 4.58*  --  4.75*  CREATININE 4.36* 4.60* 4.40*  TROPONINI <0.30  --   --     Estimated Creatinine Clearance: 23.1 ml/min (by C-G formula based on Cr of 4.4).   Medical History: Past Medical History  Diagnosis Date  . Diabetes mellitus without complication   . Chronic systolic heart failure     a) ECHO [facility] 02/2014): EF <15%, multiple apical thrombi, RV mod enlarged, mod MR b. RHC (02/21/14) at [facility]: RA 16, RV 64/9 (15), PA 64/32 (42), PCWP: 24, CI: 1.4  . HTN (hypertension)   . HLD (hyperlipidemia)   . CKD (chronic kidney disease), stage III   . Apical mural thrombus   . Mitral regurgitation   . Drug use     cocaine  . Atrial fibrillation, chronic    Assessment: 63 year old male with chronic heart failure on home dobutamine presents to [facility] with increasing shortness of breath. ECG shows very fine VT due to hyperkalemia (7.1).   Patient has history of afib and apical thombus on coumadin pta. INR 4.58 on admit up to 4.7 a few hours later. CCM consulted pharmacy to transition to heparin gtt once INR down and HD cath placed. Will plan on rechecking INR later tonight and restart heparin when appropriate.  Goal of Therapy:  INR 2-3 Heparin level 0.3-0.7 units/ml Monitor platelets by anticoagulation protocol: Yes   Plan:  Recheck INR this evening Start heparin when  INR<2  Sheppard Coil PharmD., BCPS Clinical Pharmacist Pager [phone] 06/08/2014 2:52 PM

## 2014-06-08 NOTE — ED Notes (Signed)
Pt here with SOB and N,V fort a few days. Pt on a drip for his heart. Pt pale and hypotensive at triage. Actively dry heaving. Pt speaking in short sentences.

## 2014-06-08 NOTE — Procedures (Signed)
Intubation Procedure Note [name] [id number] 07-22-1951  Procedure: Intubation Indications: Respiratory insufficiency  Procedure Details Consent: Risks of procedure as well as the alternatives and risks of each were explained to the (patient/caregiver).  Consent for procedure obtained. Time Out: Verified patient identification, verified procedure, site/side was marked, verified correct patient position, special equipment/implants available, medications/allergies/relevent history reviewed, required imaging and test results available.  Performed  Maximum sterile technique was used including gloves, hand hygiene and mask.  MAC    Evaluation Hemodynamic Status: BP stable throughout; O2 sats: stable throughout Patient's Current Condition: stable Complications: No apparent complications Patient did tolerate procedure well. Chest X-ray ordered to verify placement.  CXR: pending.   [name] 06/08/2014

## 2014-06-08 NOTE — ED Notes (Addendum)
Pt here from triage, complains of "convulsions" (while being alert and oriented, denies seizures) and sob. Pt is on dobutamine

## 2014-06-08 NOTE — ED Notes (Signed)
1118 dr bensimohn at bedside,

## 2014-06-08 NOTE — H&P (Signed)
PULMONARY / CRITICAL CARE MEDICINE   Name: [name] MRN: [id number] DOB: 07-21-1951    ADMISSION DATE:  06/08/2014 CONSULTATION DATE:  06/08/2014  REFERRING MD :  EDP  CHIEF COMPLAINT:  Resp failure and CHF  INITIAL PRESENTATION: 63 year old with end stage CHF with EF of 15% presenting with hyperkalemia, pulmonary edema, respiratory failure and renal failure.  Unable to speak in full sentences.  Minimal history.  Presents with SOB, respiratory failure and severe anasarca.  STUDIES:    SIGNIFICANT EVENTS: 10/30 VDRF due to pulmonary edema  PAST MEDICAL HISTORY :   has a past medical history of Diabetes mellitus without complication; Chronic systolic heart failure; HTN (hypertension); HLD (hyperlipidemia); CKD (chronic kidney disease), stage III; Apical mural thrombus; Mitral regurgitation; Drug use; and Atrial fibrillation, chronic.  has no past surgical history on file. Prior to Admission medications   Not on File   No Known Allergies  FAMILY HISTORY:  has no family status information on file.  SOCIAL HISTORY:  reports that he uses illicit drugs.  REVIEW OF SYSTEMS:  Unable to obtain due to respiratory distress.  SUBJECTIVE:   VITAL SIGNS: Temp:  [97.4 F (36.3 C)] 97.4 F (36.3 C) (10/30 0957) Pulse Rate:  [88-106] 95 (10/30 1130) Resp:  [17-32] 22 (10/30 1130) BP: (70-134)/(55-98) 112/98 mmHg (10/30 1130) SpO2:  [98 %-100 %] 99 % (10/30 1130) Weight:  [127.007 kg (280 lb)] 127.007 kg (280 lb) (10/30 1002) HEMODYNAMICS:   VENTILATOR SETTINGS:   INTAKE / OUTPUT: No intake or output data in the 24 hours ending 06/08/14 1156  PHYSICAL EXAMINATION: General:  Chronically ill appearing male in severe respiratory distress. Neuro:  Awake and interactive, moves all ext to command. HEENT:  [place]/AT, PERRL, EOM-I and MMM. Cardiovascular:  Regular, tachy, Nl S1/S2, -M/R/G. Lungs:  Diffuse rales. Abdomen:  Soft, NT, ND and +BS. Musculoskeletal:  2+ edema  bilaterally. Skin:  Intact.  LABS:  CBC  Recent Labs Lab 06/08/14 1040 06/08/14 1121  WBC 7.2  --   HGB 11.6* 15.6  HCT 36.9* 46.0  PLT 202  --    Coag's  Recent Labs Lab 06/08/14 1040  INR 4.58*   BMET  Recent Labs Lab 06/08/14 1040 06/08/14 1121  NA 130* 129*  K 7.1* 6.8*  CL 88* 99  CO2 13*  --   BUN 88* 84*  CREATININE 4.36* 4.60*  GLUCOSE 147* 143*   Electrolytes  Recent Labs Lab 06/08/14 1040  CALCIUM 9.7   Sepsis Markers No results found for this basename: LATICACIDVEN, PROCALCITON, O2SATVEN,  in the last 168 hours ABG No results found for this basename: PHART, PCO2ART, PO2ART,  in the last 168 hours Liver Enzymes  Recent Labs Lab 06/08/14 1040  AST 85*  ALT 66*  ALKPHOS 95  BILITOT 3.8*  ALBUMIN 3.4*   Cardiac Enzymes  Recent Labs Lab 06/08/14 1040  TROPONINI <0.30  PROBNP 11335.0*   Glucose  Recent Labs Lab 06/08/14 1013  GLUCAP 154*    Imaging No results found.   ASSESSMENT / PLAN:  PULMONARY OETT 10/30>>> A: VDRD due to pulmonary edema, acute. P:   - Intubate - Full vent support - F/U ABG - F/U CXR - Hold weaning til volume negative.  CARDIOVASCULAR CVL R PICC line (old)>>> A: Heart failure, chronically on dobutamine, end stage. P:  - Dobutamine at 5. - HD for hyperkalemia and volume - Further recommendations per cards. - Restart heparin after HD catheter is in  RENAL A:  Acute on chronic renal failure. P:   - Place HD catheter  - Renal service called. - CVVH.  GASTROINTESTINAL A:  No active issues P:   - TF - Protonix  HEMATOLOGIC A:  INR elevated due to coumadin P:  - Emergent release FFP transfusion - Once HD catheter is placed will start heparin per pharmacy  INFECTIOUS A:  No active issues. P:   - Monitor  ENDOCRINE A:  DM   P:   - ISS - Cortisol level  NEUROLOGIC A:  Sedation post tube P:   RASS goal: 0 Fentanyl drip Versed drip   FAMILY  - Updates: Spoke with  patient, full code for now, no trach/peg.  TODAY'S SUMMARY: Sedate, intubate, full support, CRRT per renal.  I have personally obtained a history, examined the patient, evaluated laboratory and imaging results, formulated the assessment and plan and placed orders.  CRITICAL CARE: The patient is critically ill with multiple organ systems failure and requires high complexity decision making for assessment and support, frequent evaluation and titration of therapies, application of advanced monitoring technologies and extensive interpretation of multiple databases. Critical Care Time devoted to patient care services described in this note is 45 minutes.   [name] G. [name], M.D. [facility]/Critical Care Medicine. Pager: [phone]. After hours pager: [phone].  06/08/2014, 11:56 AM

## 2014-06-08 NOTE — ED Notes (Signed)
Family at bedside. 

## 2014-06-08 NOTE — ED Notes (Signed)
MD at bedside. Dr Deterding

## 2014-06-08 NOTE — ED Notes (Addendum)
Called report to [employer]

## 2014-06-08 NOTE — Consult Note (Signed)
Reason for Consult:CKD4/AKI/^ K Referring Physician: Dr. Nathanial Millman [name] is an 63 y.o. male.  HPI: 63 yr male with hx DM, HTN,^ lipids,HTN, hx Afib, MR, apical thrombus, EF 10-15% followed at [facility].  Hx CKD with most recent Cr on 10/14 of 3.4 . Most of Sept in mid 2s.  K was low wk ago so po K ^.  Hx cocaine abuse.  Retired Scientist, clinical (histocompatibility and immunogenetics).  On chronic Dobutamine.         Came to ER with severe dyspnea, SOB which has been progressive.  Cr 4.36, K 7.1 and wide complex tachcardia.  Entub here.          Not clear has ever seen Nephrology.  Review of systems not obtained due to patient factors.  Past Medical History  Diagnosis Date  . Diabetes mellitus without complication   . Chronic systolic heart failure     a) ECHO [facility] 02/2014): EF <15%, multiple apical thrombi, RV mod enlarged, mod MR b. RHC (02/21/14) at [facility]: RA 16, RV 64/9 (15), PA 64/32 (42), PCWP: 24, CI: 1.4  . HTN (hypertension)   . HLD (hyperlipidemia)   . CKD (chronic kidney disease), stage III   . Apical mural thrombus   . Mitral regurgitation   . Drug use     cocaine  . Atrial fibrillation, chronic     History reviewed. No pertinent past surgical history.  History reviewed. No pertinent family history.  Social History:  reports that he uses illicit drugs. His tobacco and alcohol histories are not on file.  Allergies: No Known Allergies  Medications: I have reviewed the patient's current medications. Prior to Admission:  (Not in a hospital admission)   Results for orders placed during the hospital encounter of 06/08/14 (from the past 48 hour(s))  CBG MONITORING, ED     Status: Abnormal   Collection Time    06/08/14 10:13 AM      Result Value Ref Range   Glucose-Capillary 154 (*) 70 - 99 mg/dL   Comment 1 Documented in Chart     Comment 2 Notify RN    TROPONIN I     Status: None   Collection Time    06/08/14 10:40 AM      Result Value Ref Range   Troponin I <0.30  <0.30 ng/mL   Comment:             Due to the release kinetics of cTnI,     a negative result within the first hours     of the onset of symptoms does not rule out     myocardial infarction with certainty.     If myocardial infarction is still suspected,     repeat the test at appropriate intervals.  CBC     Status: Abnormal   Collection Time    06/08/14 10:40 AM      Result Value Ref Range   WBC 7.2  4.0 - 10.5 K/uL   RBC 4.82  4.22 - 5.81 MIL/uL   Hemoglobin 11.6 (*) 13.0 - 17.0 g/dL   HCT 36.9 (*) 39.0 - 52.0 %   MCV 76.6 (*) 78.0 - 100.0 fL   MCH 24.1 (*) 26.0 - 34.0 pg   MCHC 31.4  30.0 - 36.0 g/dL   RDW 20.2 (*) 11.5 - 15.5 %   Platelets 202  150 - 400 K/uL  PRO B NATRIURETIC PEPTIDE     Status: Abnormal   Collection Time    06/08/14  10:40 AM      Result Value Ref Range   Pro B Natriuretic peptide (BNP) 11335.0 (*) 0 - 125 pg/mL  PROTIME-INR     Status: Abnormal   Collection Time    06/08/14 10:40 AM      Result Value Ref Range   Prothrombin Time 43.7 (*) 11.6 - 15.2 seconds   INR 4.58 (*) 0.00 - 1.49  COMPREHENSIVE METABOLIC PANEL     Status: Abnormal   Collection Time    06/08/14 10:40 AM      Result Value Ref Range   Sodium 130 (*) 137 - 147 mEq/L   Potassium 7.1 (*) 3.7 - 5.3 mEq/L   Comment: NO VISIBLE HEMOLYSIS     CRITICAL RESULT CALLED TO, READ BACK BY AND VERIFIED WITH:     M.BROWN,RN 06/08/14 1132 BY BSLADE   Chloride 88 (*) 96 - 112 mEq/L   CO2 13 (*) 19 - 32 mEq/L   Glucose, Bld 147 (*) 70 - 99 mg/dL   BUN 88 (*) 6 - 23 mg/dL   Creatinine, Ser 4.36 (*) 0.50 - 1.35 mg/dL   Calcium 9.7  8.4 - 10.5 mg/dL   Total Protein 8.6 (*) 6.0 - 8.3 g/dL   Albumin 3.4 (*) 3.5 - 5.2 g/dL   AST 85 (*) 0 - 37 U/L   ALT 66 (*) 0 - 53 U/L   Alkaline Phosphatase 95  39 - 117 U/L   Total Bilirubin 3.8 (*) 0.3 - 1.2 mg/dL   GFR calc non Af Amer 13 (*) >90 mL/min   GFR calc Af Amer 15 (*) >90 mL/min   Comment: (NOTE)     The eGFR has been calculated using the CKD EPI equation.     This calculation has  not been validated in all clinical situations.     eGFR's persistently <90 mL/min signify possible Chronic Kidney     Disease.   Anion gap 29 (*) 5 - 15  I-STAT TROPOININ, ED     Status: None   Collection Time    06/08/14 10:51 AM      Result Value Ref Range   Troponin i, poc 0.03  0.00 - 0.08 ng/mL   Comment 3            Comment: Due to the release kinetics of cTnI,     a negative result within the first hours     of the onset of symptoms does not rule out     myocardial infarction with certainty.     If myocardial infarction is still suspected,     repeat the test at appropriate intervals.  I-STAT CHEM 8, ED     Status: Abnormal   Collection Time    06/08/14 11:21 AM      Result Value Ref Range   Sodium 129 (*) 137 - 147 mEq/L   Potassium 6.8 (*) 3.7 - 5.3 mEq/L   Chloride 99  96 - 112 mEq/L   BUN 84 (*) 6 - 23 mg/dL   Creatinine, Ser 4.60 (*) 0.50 - 1.35 mg/dL   Glucose, Bld 143 (*) 70 - 99 mg/dL   Calcium, Ion 1.00 (*) 1.13 - 1.30 mmol/L   TCO2 16  0 - 100 mmol/L   Hemoglobin 15.6  13.0 - 17.0 g/dL   HCT 46.0  39.0 - 52.0 %   Comment NOTIFIED PHYSICIAN    TYPE AND SCREEN     Status: None   Collection Time  06/08/14 11:42 AM      Result Value Ref Range   ABO/RH(D) PENDING     Antibody Screen PENDING     Sample Expiration 06/11/2014     Unit Number [id number]     Blood Component Type RBC LR PHER2     Unit division 00     Status of Unit ISSUED     Unit tag comment VERBAL ORDERS PER DR GENTRY     Transfusion Status OK TO TRANSFUSE     Crossmatch Result PENDING     Unit Number [id number]     Blood Component Type RBC LR PHER1     Unit division 00     Status of Unit ISSUED     Unit tag comment VERBAL ORDERS PER DR GENTRY     Transfusion Status OK TO TRANSFUSE     Crossmatch Result PENDING     Unit Number [id number]     Blood Component Type RBC LR PHER1     Unit division 00     Status of Unit ISSUED     Unit tag comment VERBAL ORDERS PER DR GENTRY      Transfusion Status OK TO TRANSFUSE     Crossmatch Result PENDING     Unit Number [id number]     Blood Component Type RED CELLS,LR     Unit division 00     Status of Unit ISSUED     Unit tag comment VERBAL ORDERS PER DR GENTRY     Transfusion Status OK TO TRANSFUSE     Crossmatch Result PENDING    PREPARE FRESH FROZEN PLASMA     Status: None   Collection Time    06/08/14 11:42 AM      Result Value Ref Range   Unit Number [id number]     Blood Component Type LIQ PLASMA     Unit division 00     Status of Unit ISSUED     Unit tag comment VERBAL ORDERS PER DR GENTRY     Transfusion Status OK TO TRANSFUSE     Unit Number [id number]     Blood Component Type LIQ PLASMA     Unit division 00     Status of Unit ISSUED     Unit tag comment VERBAL ORDERS PER DR GENTRY     Transfusion Status OK TO TRANSFUSE    CARBOXYHEMOGLOBIN     Status: Abnormal   Collection Time    06/08/14 11:45 AM      Result Value Ref Range   Total hemoglobin 12.0 (*) 13.5 - 18.0 g/dL   O2 Saturation 49.1     Carboxyhemoglobin 1.1  0.5 - 1.5 %   Methemoglobin 1.0  0.0 - 1.5 %  PREPARE FRESH FROZEN PLASMA     Status: None   Collection Time    06/08/14 12:24 PM      Result Value Ref Range   Unit Number [id number]     Blood Component Type THW PLS APHR     Unit division C0     Status of Unit REL FROM [facility]     Unit tag comment VERBAL ORDERS PER DR GENTRY     Transfusion Status OK TO TRANSFUSE     Unit Number [id number]     Blood Component Type THAWED PLASMA     Unit division 00     Status of Unit REL FROM [facility]     Unit tag comment VERBAL ORDERS PER DR Colin Rhein  Transfusion Status OK TO TRANSFUSE     Unit Number [id number]     Blood Component Type LIQ PLASMA     Unit division 00     Status of Unit ISSUED     Unit tag comment VERBAL ORDERS PER DR GENTRY     Transfusion Status OK TO TRANSFUSE     Unit Number [id number]     Blood Component Type THW PLS APHR     Unit division B0      Status of Unit ISSUED     Unit tag comment VERBAL ORDERS PER DR GENTRY     Transfusion Status OK TO TRANSFUSE      Dg Chest Port 1 View  06/08/2014   CLINICAL DATA:  Shortness of Breath beginning last night with nausea starting this morning. Initial encounter.  EXAM: PORTABLE CHEST - 1 VIEW  COMPARISON:  None.  FINDINGS: 1023 hrs. Right PICC [name] tip projects at innominate vein confluence. Lungs are clear without edema or focal airspace consolidation. No pleural effusion. The cardio pericardial silhouette is enlarged. Imaged bony structures of the thorax are intact. Telemetry leads overlie the chest.  IMPRESSION: Enlargement of the cardiopericardial silhouette without edema or focal airspace consolidation.   Electronically Signed   By: Misty Stanley M.D.   On: 06/08/2014 10:59    ROS Blood pressure 118/105, pulse 112, temperature 97.4 F (36.3 C), resp. rate 16, height 6' (1.829 m), weight 127.007 kg (280 lb), SpO2 100.00%. Physical Exam Physical Examination: General appearance - entub not responsive, paralyzed, obese  Mental status - as above Eyes - pupils not reactive, cannot see fundi Mouth - mucous membranes moist, pharynx normal without lesions Neck - adenopathy noted PCL Lymphatics - posterior cervical nodes Chest - rales noted bilat, rhonchi noted bilat Heart - tachy at 120s, wide complex Gr2/6 M, 2+ edema Abdomen - soft, liver down 7 cm, bs pos but decreased Neurological - as above, paralyzed, pupils not reactive no gag now Skin - dry, scaling mild  Assessment/Plan: 1 CKD 4 presumably DM vs cardiorenal vs other 2 AKI  Cardiac, ? Other drugs, need hx.  Now ^ K,vol xs, will need CRRT to tx K then vol.  Tx low bicarb with iv and CRRT.  Needs some reversal of anticoag for access. 3 Hypertension: not an issue 4. Anemia mild 5. Metabolic Bone Disease: not assessed 6 CM poor outlook, cont DB 7 DM monitor P CRRT, kayex, Ca, bicarb, insulin, glu, U/S , UA  [name]  L 06/08/2014, 12:36 PM

## 2014-06-08 NOTE — ED Notes (Addendum)
Pt is on home dobutamine drip at 9 ml, hr

## 2014-06-08 NOTE — ED Provider Notes (Signed)
CSN: [id number]     Arrival date & time 06/08/14  6962 History   First MD Initiated Contact with Patient 06/08/14 1001     Chief Complaint  Patient presents with  . Shortness of Breath  . Nausea     (Consider location/radiation/quality/duration/timing/severity/associated sxs/prior Treatment) Patient is a 63 y.o. male presenting with shortness of breath. The history is provided by the patient. No language interpreter was used.  Shortness of Breath Severity:  Severe Onset quality:  Gradual Duration:  3 days Timing:  Constant Progression:  Worsening Chronicity:  Recurrent Context: not URI   Relieved by:  Rest Worsened by:  Exertion Ineffective treatments:  None tried Associated symptoms: PND   Associated symptoms: no abdominal pain, no chest pain, no cough, no fever, no headaches, no rash, no sore throat, no syncope and no vomiting   Risk factors: no hx of PE/DVT   Risk factors comment:  Severe dilated cardiomyopathy, HTN, AF RVR, polysubstance abuse   Past Medical History  Diagnosis Date  . Diabetes mellitus without complication   . CHF (congestive heart failure)   . Gout    History reviewed. No pertinent past surgical history. History reviewed. No pertinent family history. History  Substance Use Topics  . Smoking status: Never Smoker   . Smokeless tobacco: Not on file  . Alcohol Use: No    Review of Systems  Constitutional: Negative for fever.  HENT: Negative for congestion, rhinorrhea and sore throat.   Respiratory: Negative for cough and shortness of breath.   Cardiovascular: Positive for PND. Negative for chest pain and syncope.  Gastrointestinal: Negative for nausea, vomiting, abdominal pain and diarrhea.  Genitourinary: Negative for dysuria and hematuria.  Skin: Negative for rash.  Neurological: Negative for syncope, light-headedness and headaches.  All other systems reviewed and are negative.     Allergies  Review of patient's allergies indicates no  known allergies.  Home Medications   Prior to Admission medications   Not on File   BP 134/78  Pulse 106  Temp(Src) 97.4 F (36.3 C)  Resp 21  Ht 6' (1.829 m)  Wt 280 lb (127.007 kg)  BMI 37.97 kg/m2  SpO2 100% Physical Exam  Nursing note and vitals reviewed. Constitutional: He is oriented to person, place, and time. He appears well-developed and well-nourished.  HENT:  Head: Normocephalic and atraumatic.  Right Ear: External ear normal.  Left Ear: External ear normal.  Eyes: EOM are normal.  Neck: Normal range of motion. Neck supple.  Cardiovascular: Normal rate, regular rhythm and intact distal pulses.  Exam reveals no gallop and no friction rub.   No murmur heard. Pulmonary/Chest: Effort normal and breath sounds normal. No respiratory distress. He has no wheezes. He has no rales. He exhibits no tenderness.  Abdominal: Soft. Bowel sounds are normal. He exhibits no distension. There is no tenderness. There is no rebound.  Musculoskeletal: Normal range of motion. He exhibits no edema and no tenderness.  Lymphadenopathy:    He has no cervical adenopathy.  Neurological: He is alert and oriented to person, place, and time.  Skin: Skin is warm. No rash noted.  Psychiatric: He has a normal mood and affect. His behavior is normal.    ED Course  Procedures (including critical care time) Labs Review Labs Reviewed  CBC - Abnormal; Notable for the following:    Hemoglobin 11.6 (*)    HCT 36.9 (*)    MCV 76.6 (*)    MCH 24.1 (*)    RDW  20.2 (*)    All other components within normal limits  PRO B NATRIURETIC PEPTIDE - Abnormal; Notable for the following:    Pro B Natriuretic peptide (BNP) 11335.0 (*)    All other components within normal limits  PROTIME-INR - Abnormal; Notable for the following:    Prothrombin Time 43.7 (*)    INR 4.58 (*)    All other components within normal limits  COMPREHENSIVE METABOLIC PANEL - Abnormal; Notable for the following:    Sodium 130 (*)     Potassium 7.1 (*)    Chloride 88 (*)    CO2 13 (*)    Glucose, Bld 147 (*)    BUN 88 (*)    Creatinine, Ser 4.36 (*)    Total Protein 8.6 (*)    Albumin 3.4 (*)    AST 85 (*)    ALT 66 (*)    Total Bilirubin 3.8 (*)    GFR calc non Af Amer 13 (*)    GFR calc Af Amer 15 (*)    Anion gap 29 (*)    All other components within normal limits  CARBOXYHEMOGLOBIN - Abnormal; Notable for the following:    Total hemoglobin 12.0 (*)    All other components within normal limits  CBC WITH DIFFERENTIAL - Abnormal; Notable for the following:    Hemoglobin 12.0 (*)    HCT 38.6 (*)    MCH 24.5 (*)    RDW 20.8 (*)    Neutrophils Relative % 85 (*)    Lymphocytes Relative 7 (*)    Lymphs Abs 0.5 (*)    All other components within normal limits  COMPREHENSIVE METABOLIC PANEL - Abnormal; Notable for the following:    Sodium 130 (*)    Potassium 7.0 (*)    Chloride 89 (*)    CO2 11 (*)    Glucose, Bld 199 (*)    BUN 87 (*)    Creatinine, Ser 4.40 (*)    Calcium 10.8 (*)    Total Protein 8.4 (*)    Albumin 3.3 (*)    AST 98 (*)    ALT 76 (*)    Total Bilirubin 3.9 (*)    GFR calc non Af Amer 13 (*)    GFR calc Af Amer 15 (*)    Anion gap 30 (*)    All other components within normal limits  APTT - Abnormal; Notable for the following:    aPTT 43 (*)    All other components within normal limits  PROTIME-INR - Abnormal; Notable for the following:    Prothrombin Time 44.9 (*)    INR 4.75 (*)    All other components within normal limits  LACTIC ACID, PLASMA - Abnormal; Notable for the following:    Lactic Acid, Venous 10.7 (*)    All other components within normal limits  PHOSPHORUS - Abnormal; Notable for the following:    Phosphorus 5.5 (*)    All other components within normal limits  RENAL FUNCTION PANEL - Abnormal; Notable for the following:    Sodium 131 (*)    Potassium 6.3 (*)    Chloride 92 (*)    Glucose, Bld 182 (*)    BUN 85 (*)    Creatinine, Ser 4.44 (*)     Phosphorus 5.2 (*)    Albumin 3.2 (*)    GFR calc non Af Amer 13 (*)    GFR calc Af Amer 15 (*)    Anion gap 20 (*)  All other components within normal limits  PROTIME-INR - Abnormal; Notable for the following:    Prothrombin Time 29.6 (*)    INR 2.79 (*)    All other components within normal limits  BLOOD GAS, ARTERIAL - Abnormal; Notable for the following:    pH, Arterial 7.298 (*)    pCO2 arterial 32.9 (*)    pO2, Arterial 197.0 (*)    Bicarbonate 15.8 (*)    Acid-base deficit 9.5 (*)    All other components within normal limits  GLUCOSE, CAPILLARY - Abnormal; Notable for the following:    Glucose-Capillary 159 (*)    All other components within normal limits  CBG MONITORING, ED - Abnormal; Notable for the following:    Glucose-Capillary 154 (*)    All other components within normal limits  I-STAT CHEM 8, ED - Abnormal; Notable for the following:    Sodium 129 (*)    Potassium 6.8 (*)    BUN 84 (*)    Creatinine, Ser 4.60 (*)    Glucose, Bld 143 (*)    Calcium, Ion 1.00 (*)    All other components within normal limits  CBG MONITORING, ED - Abnormal; Notable for the following:    Glucose-Capillary 185 (*)    All other components within normal limits  MRSA PCR SCREENING  TROPONIN I  MAGNESIUM  MAGNESIUM  BLOOD GAS, ARTERIAL  CORTISOL  RENAL FUNCTION PANEL  URINALYSIS, ROUTINE W REFLEX MICROSCOPIC  MAGNESIUM  PHOSPHORUS  CBC  PHOSPHORUS  MAGNESIUM  APTT  COMPREHENSIVE METABOLIC PANEL  PROTIME-INR  I-STAT TROPOININ, ED  I-STAT ARTERIAL BLOOD GAS, ED  TYPE AND SCREEN  PREPARE FRESH FROZEN PLASMA  PREPARE FRESH FROZEN PLASMA  ABO/RH  PREPARE FRESH FROZEN PLASMA    Imaging Review US Renal  06/08/2014   CLINICAL DATA:  Chronic kidney disease.  EXAM: RENAL/URINARY TRACT ULTRASOUND COMPLETE  COMPARISON:  None.  FINDINGS: Right Kidney:  Length: 11.7 cm in length. Echogenicity within normal limits. No mass or hydronephrosis visualized.  Left Kidney:  Length:  11.1 cm in length. Echogenicity within normal limits. No mass or hydronephrosis visualized.  Bladder:  The bladder cannot be visualized. This likely represents that it is completely decompressed.  IMPRESSION: Within normal limits.  The bladder is most likely completely decompressed by the Foley catheter since it was not visualized.   Electronically Signed   By: Maryclare Bean M.D.   On: 06/08/2014 16:35   Dg Chest Portable 1 View  06/08/2014   CLINICAL DATA:  Intubation. Diabetes. Heart failure. Chronic kidney disease.  EXAM: PORTABLE CHEST - 1 VIEW  COMPARISON:  06/08/2014  FINDINGS: A nasogastric tube extends down to the stomach. A right-sided PICC line terminates over the SVC.  There is some tubing projecting over the lower neck. This is about 2.5 cm above the thoracic inlet. If this is an endotracheal tube, it probably needs to be advanced about 7 cm.  Moderate enlargement of the cardiopericardial silhouette. Low lung volumes.  IMPRESSION: 1. Tubing projects over the lower neck, well above the thoracic inlet. If this is an endotracheal tube, it probably needs to be advanced about 7 cm. 2. Moderate enlargement of the cardiopericardial silhouette. These results will be called to the ordering clinician or representative by the Radiologist Assistant, and communication documented in the PACS or zVision Dashboard.   Electronically Signed   By: Herbie Baltimore M.D.   On: 06/08/2014 12:45   Dg Chest Port 1 View  06/08/2014   CLINICAL DATA:  Shortness of Breath beginning last night with nausea starting this morning. Initial encounter.  EXAM: PORTABLE CHEST - 1 VIEW  COMPARISON:  None.  FINDINGS: 1023 hrs. Right PICC line tip projects at innominate vein confluence. Lungs are clear without edema or focal airspace consolidation. No pleural effusion. The cardio pericardial silhouette is enlarged. Imaged bony structures of the thorax are intact. Telemetry leads overlie the chest.  IMPRESSION: Enlargement of the  cardiopericardial silhouette without edema or focal airspace consolidation.   Electronically Signed   By: Kennith CenterEric  Mansell M.D.   On: 06/08/2014 10:59     EKG Interpretation   Date/Time:  Friday June 08 2014 10:59:31 EDT Ventricular Rate:  82 PR Interval:  61 QRS Duration: 157 QT Interval:  627 QTC Calculation: 732 R Axis:   -107 Text Interpretation:  Ectopic atrial rhythm Short PR interval since last  tracing qrs has widened Confirmed by Mirian MoGentry, Matthew [phone]([id number]) on 06/08/2014  11:02:43 AM      MDM   Final diagnoses:  Visit for intubation  History of ETT  Acute on chronic renal failure  Acute on chronic systolic congestive heart failure  Acute respiratory failure with hypoxia  Cardiogenic shock  Hyperkalemia  AKI (acute kidney injury)    10:17 AM Pt is a 63 y.o. male with pertinent PMHX of Severe, Dilated Cardiomyopathy (presumed non-ischemic, EF <15% in 01/2013), HTN, DM2 (HgbA1c 8.3%, 10/2013), AF w/ RVR on warfarin, CKD (stage III, bSCr 1.6), Spinal Stenosis, Gout, Polysubstance Abuse (cocaine actively),   who presents to the ED with worsening shorntess of breath for 2-3 days. Worsening orthopnea and PND. [facility] for severe dilated cardiomyopathy. Compliant with medications.Patient with home BP's int he 80s on IV dobutamine at home. No infectious symptoms. Post tussive emesis. Unsure if weight gain. No chest pain. No syncope.  Echocardiogram 02/15/2014: EF <15%. Multiple LV apical thrombi. Akinesis of inferior, septal, and posterior walls. RV moderately enlarged. LA moderately enlarged. Moderate MR. Moderate TR 2.1 m/s peak TR velocity 48 mmhg RV pressure.   cocnern for CHF exacerbation. deneis illicit drug abuse, history of cocaine abuse. [facility]. Plan for EKg, CXR, CBc, CMP, Troponin and BNP  EKG personally reviewed by myself showed RBBB Rate of 92, PR NAms, QRS 186ms QT/QTC 548/64077ms, left axis, without evidence of new ischemia. Comparison showed NSR,  indication: shortness of breath  US guided iv placed left arm for access. No images archived  Review of labs: istat chem 8: hyperkalemia, elevated Cr istat trop: 0.03 Troponin: <0.30 BNP:11335.0 INR 4.58  Cardiology at bedside. EKG concerning for hyperkalemia: plan for calcium IV consult. Plan for albuterol, insulin and kayexalate   Intubated at bedside with critical care. Nephrology consulted for possible emergent dialysis for fluid overload and hyperkalemia  Plan for admission to ICU for emergent dialysis for fluid overload and hyperkalemia   Labs, EKG and imaging reviewed by myself and considered in medical decision making if ordered.  Imaging interpreted by radiology. Pt was discussed with my attending, Dr. Dolores LoryGentry     Mayumi Summerson Peter Brysten Reister, MD 06/08/14 [id number]

## 2014-06-08 NOTE — Progress Notes (Signed)
06/08/14 1200  Clinical Encounter Type  Visited With Patient and family together;Health care provider  Visit Type Initial;ED  Referral From Nurse  Stress Factors  Patient Stress Factors Health changes   Chaplain was paged to the ED at 11:06  AM. Chaplain was informed that there was a patient in Trauma C that the medical team thought might code soon. When Chaplain arrived patient was alert and coherent and speaking to a physician. There was a family member in the trauma bay that I was not yet acquainted with. Chaplain was paged back to Trauma C at 11:51 AM and was notified that the patient's family was present. Patient was intubated when chaplain arrived and the only family member present was the same person who was there before. Chaplain spoke with family member who identified themselves as the patient's nephew. Chaplain's nephew said that he was ok and that he did not know when more family members would arrive. Patient is in the process of being transferred to ICU. Chaplain will continue to provide emotional and spiritual support for patient and patient's family as needed. Cranston Neighbor, Chaplain 12:53 PM

## 2014-06-08 NOTE — ED Notes (Signed)
Pt reports his blood pressure is always in the 80's, pt is a pt of [facility]

## 2014-06-08 NOTE — ED Notes (Signed)
Attempted temperature foley twice. No urine return and blood clot when taken out

## 2014-06-09 DIAGNOSIS — R570 Cardiogenic shock: Secondary | ICD-10-CM

## 2014-06-09 DIAGNOSIS — J9601 Acute respiratory failure with hypoxia: Secondary | ICD-10-CM

## 2014-06-09 DIAGNOSIS — I5023 Acute on chronic systolic (congestive) heart failure: Principal | ICD-10-CM

## 2014-06-09 LAB — CARBOXYHEMOGLOBIN
Carboxyhemoglobin: 1.1 % (ref 0.5–1.5)
Methemoglobin: 1 % (ref 0.0–1.5)
O2 SAT: 66.7 %
Total hemoglobin: 10.5 g/dL — ABNORMAL LOW (ref 13.5–18.0)

## 2014-06-09 LAB — PREPARE FRESH FROZEN PLASMA
Unit division: 0
Unit division: 0
Unit division: 0
Unit division: 0

## 2014-06-09 LAB — POCT ACTIVATED CLOTTING TIME
ACTIVATED CLOTTING TIME: 185 s
ACTIVATED CLOTTING TIME: 191 s
ACTIVATED CLOTTING TIME: 208 s
Activated Clotting Time: 168 seconds
Activated Clotting Time: 168 seconds
Activated Clotting Time: 174 seconds
Activated Clotting Time: 185 seconds
Activated Clotting Time: 197 seconds
Activated Clotting Time: 197 seconds
Activated Clotting Time: 208 seconds
Activated Clotting Time: 214 seconds

## 2014-06-09 LAB — COMPREHENSIVE METABOLIC PANEL
ALBUMIN: 3 g/dL — AB (ref 3.5–5.2)
ALT: 282 U/L — ABNORMAL HIGH (ref 0–53)
ANION GAP: 13 (ref 5–15)
AST: 492 U/L — AB (ref 0–37)
Alkaline Phosphatase: 77 U/L (ref 39–117)
BUN: 50 mg/dL — ABNORMAL HIGH (ref 6–23)
CO2: 26 meq/L (ref 19–32)
CREATININE: 2.56 mg/dL — AB (ref 0.50–1.35)
Calcium: 8.6 mg/dL (ref 8.4–10.5)
Chloride: 92 mEq/L — ABNORMAL LOW (ref 96–112)
GFR calc Af Amer: 29 mL/min — ABNORMAL LOW (ref >90)
GFR calc non Af Amer: 25 mL/min — ABNORMAL LOW (ref >90)
Glucose, Bld: 150 mg/dL — ABNORMAL HIGH (ref 70–99)
Potassium: 3.5 mEq/L — ABNORMAL LOW (ref 3.7–5.3)
Sodium: 131 mEq/L — ABNORMAL LOW (ref 137–147)
Total Bilirubin: 2.8 mg/dL — ABNORMAL HIGH (ref 0.3–1.2)
Total Protein: 7.1 g/dL (ref 6.0–8.3)

## 2014-06-09 LAB — GLUCOSE, CAPILLARY
GLUCOSE-CAPILLARY: 153 mg/dL — AB (ref 70–99)
Glucose-Capillary: 185 mg/dL — ABNORMAL HIGH (ref 70–99)
Glucose-Capillary: 129 mg/dL — ABNORMAL HIGH (ref 70–99)
Glucose-Capillary: 129 mg/dL — ABNORMAL HIGH (ref 70–99)
Glucose-Capillary: 170 mg/dL — ABNORMAL HIGH (ref 70–99)
Glucose-Capillary: 200 mg/dL — ABNORMAL HIGH (ref 70–99)

## 2014-06-09 LAB — IRON AND TIBC
Iron: 21 ug/dL — ABNORMAL LOW (ref 42–135)
SATURATION RATIOS: 6 % — AB (ref 20–55)
TIBC: 341 ug/dL (ref 215–435)
UIBC: 320 ug/dL (ref 125–400)

## 2014-06-09 LAB — CBC
HCT: 29.1 % — ABNORMAL LOW (ref 39.0–52.0)
Hemoglobin: 9.3 g/dL — ABNORMAL LOW (ref 13.0–17.0)
MCH: 24.1 pg — ABNORMAL LOW (ref 26.0–34.0)
MCHC: 32 g/dL (ref 30.0–36.0)
MCV: 75.4 fL — ABNORMAL LOW (ref 78.0–100.0)
Platelets: 161 10*3/uL (ref 150–400)
RBC: 3.86 MIL/uL — ABNORMAL LOW (ref 4.22–5.81)
RDW: 20.1 % — AB (ref 11.5–15.5)
WBC: 5.9 10*3/uL (ref 4.0–10.5)

## 2014-06-09 LAB — BASIC METABOLIC PANEL
ANION GAP: 14 (ref 5–15)
BUN: 35 mg/dL — ABNORMAL HIGH (ref 6–23)
CHLORIDE: 95 meq/L — AB (ref 96–112)
CO2: 24 mEq/L (ref 19–32)
CREATININE: 2.16 mg/dL — AB (ref 0.50–1.35)
Calcium: 8.6 mg/dL (ref 8.4–10.5)
GFR calc non Af Amer: 30 mL/min — ABNORMAL LOW (ref >90)
GFR, EST AFRICAN AMERICAN: 35 mL/min — AB (ref >90)
Glucose, Bld: 201 mg/dL — ABNORMAL HIGH (ref 70–99)
POTASSIUM: 4.5 meq/L (ref 3.7–5.3)
Sodium: 133 mEq/L — ABNORMAL LOW (ref 137–147)

## 2014-06-09 LAB — PROTIME-INR
INR: 3.25 — AB (ref 0.00–1.49)
Prothrombin Time: 33.4 seconds — ABNORMAL HIGH (ref 11.6–15.2)

## 2014-06-09 LAB — PHOSPHORUS
Phosphorus: 3.2 mg/dL (ref 2.3–4.6)
Phosphorus: 3 mg/dL (ref 2.3–4.6)
Phosphorus: 2.6 mg/dL (ref 2.3–4.6)

## 2014-06-09 LAB — MAGNESIUM
MAGNESIUM: 2 mg/dL (ref 1.5–2.5)
Magnesium: 2.1 mg/dL (ref 1.5–2.5)
Magnesium: 2.3 mg/dL (ref 1.5–2.5)

## 2014-06-09 LAB — CORTISOL: Cortisol, Plasma: 50.2 ug/dL

## 2014-06-09 LAB — APTT: APTT: 192 s — AB (ref 24–37)

## 2014-06-09 MED ORDER — PRISMASOL BGK 4/2.5 32-4-2.5 MEQ/L IV SOLN
INTRAVENOUS | Status: DC
  Administered 2014-06-09 – 2014-06-20 (×97): via INTRAVENOUS_CENTRAL
  Filled 2014-06-09 (×141): qty 5000

## 2014-06-09 MED ORDER — PANTOPRAZOLE SODIUM 40 MG PO PACK
40.0000 mg | PACK | Freq: Every day | ORAL | Status: DC
  Administered 2014-06-09 – 2014-06-10 (×2): 40 mg
  Filled 2014-06-09 (×3): qty 20

## 2014-06-09 MED ORDER — PRISMASOL BGK 4/2.5 32-4-2.5 MEQ/L IV SOLN
INTRAVENOUS | Status: DC
  Administered 2014-06-09 – 2014-06-20 (×38): via INTRAVENOUS_CENTRAL
  Filled 2014-06-09 (×47): qty 5000

## 2014-06-09 MED ORDER — PRISMASOL BGK 4/2.5 32-4-2.5 MEQ/L IV SOLN
INTRAVENOUS | Status: DC
  Administered 2014-06-09 – 2014-06-20 (×29): via INTRAVENOUS_CENTRAL
  Filled 2014-06-09 (×41): qty 5000

## 2014-06-09 MED ORDER — POTASSIUM CHLORIDE 20 MEQ/15ML (10%) PO LIQD: 20.0000 meq | Freq: Once | ORAL | Status: DC

## 2014-06-09 MED ORDER — AMIODARONE HCL 200 MG PO TABS
200.0000 mg | ORAL_TABLET | Freq: Every day | ORAL | Status: DC
  Administered 2014-06-09 – 2014-06-28 (×20): 200 mg via ORAL
  Filled 2014-06-09 (×20): qty 1

## 2014-06-09 NOTE — Progress Notes (Signed)
Subjective: Interval History: has no complaint , entub.  Objective: Vital signs in last 24 hours: Temp:  [93.8 F (34.3 C)-97.7 F (36.5 C)] 96.9 F (36.1 C) (10/31 0715) Pulse Rate:  [37-113] 86 (10/31 0715) Resp:  [15-32] 16 (10/31 0715) BP: (70-134)/(50-105) 111/73 mmHg (10/31 0715) SpO2:  [97 %-100 %] 100 % (10/31 0715) FiO2 (%):  [40 %-100 %] 40 % (10/31 0400) Weight:  [112.4 kg (247 lb 12.8 oz)-127.007 kg (280 lb)] 114.1 kg (251 lb 8.7 oz) (10/31 0402) Weight change:   Intake/Output from previous day: 10/30 0701 - 10/31 0700 In: 2219 [I.V.:702; Blood:810.9; NG/GT:334.2; IV Piggyback:132] Out: 940 [Urine:305] Intake/Output this shift:    General appearance: cooperative and awake, acknowleges me,   Neck: IJ cath Resp: diminished breath sounds bilaterally and rhonchi bilaterally Cardio: S1, S2 normal and systolic murmur: holosystolic 2/6, blowing at apex GI: has bs, soft,liver down 7 cm Extremities: edema 2+  Lab Results:  Recent Labs  06/08/14 1216 06/09/14 0358  WBC 7.3 5.9  HGB 12.0* 9.3*  HCT 38.6* 29.1*  PLT 178 161   BMET:  Recent Labs  06/08/14 2000 06/09/14 0358  NA 131* 131*  K 4.9 3.5*  CL 94* 92*  CO2 22 26  GLUCOSE 175* 150*  BUN 65* 50*  CREATININE 3.51* 2.56*  CALCIUM 9.5 8.6   No results found for this basename: PTH,  in the last 72 hours Iron Studies: No results found for this basename: IRON, TIBC, TRANSFERRIN, FERRITIN,  in the last 72 hours  Studies/Results: US Renal  06/08/2014   CLINICAL DATA:  Chronic kidney disease.  EXAM: RENAL/URINARY TRACT ULTRASOUND COMPLETE  COMPARISON:  None.  FINDINGS: Right Kidney:  Length: 11.7 cm in length. Echogenicity within normal limits. No mass or hydronephrosis visualized.  Left Kidney:  Length: 11.1 cm in length. Echogenicity within normal limits. No mass or hydronephrosis visualized.  Bladder:  The bladder cannot be visualized. This likely represents that it is completely decompressed.   IMPRESSION: Within normal limits.  The bladder is most likely completely decompressed by the Foley catheter since it was not visualized.   Electronically Signed   By: Maryclare Bean M.D.   On: 06/08/2014 16:35   Dg Chest Portable 1 View  06/08/2014   CLINICAL DATA:  Intubation. Diabetes. Heart failure. Chronic kidney disease.  EXAM: PORTABLE CHEST - 1 VIEW  COMPARISON:  06/08/2014  FINDINGS: A nasogastric tube extends down to the stomach. A right-sided PICC line terminates over the SVC.  There is some tubing projecting over the lower neck. This is about 2.5 cm above the thoracic inlet. If this is an endotracheal tube, it probably needs to be advanced about 7 cm.  Moderate enlargement of the cardiopericardial silhouette. Low lung volumes.  IMPRESSION: 1. Tubing projects over the lower neck, well above the thoracic inlet. If this is an endotracheal tube, it probably needs to be advanced about 7 cm. 2. Moderate enlargement of the cardiopericardial silhouette. These results will be called to the ordering clinician or representative by the Radiologist Assistant, and communication documented in the PACS or zVision Dashboard.   Electronically Signed   By: Herbie Baltimore M.D.   On: 06/08/2014 12:45   Dg Chest Port 1 View  06/08/2014   CLINICAL DATA:  Shortness of Breath beginning last night with nausea starting this morning. Initial encounter.  EXAM: PORTABLE CHEST - 1 VIEW  COMPARISON:  None.  FINDINGS: 1023 hrs. Right PICC line tip projects at innominate vein confluence. Lungs  are clear without edema or focal airspace consolidation. No pleural effusion. The cardio pericardial silhouette is enlarged. Imaged bony structures of the thorax are intact. Telemetry leads overlie the chest.  IMPRESSION: Enlargement of the cardiopericardial silhouette without edema or focal airspace consolidation.   Electronically Signed   By: Kennith CenterEric  Mansell M.D.   On: 06/08/2014 10:59    I have reviewed the patient's current  medications.  Assessment/Plan: 1  CKD4/AKI  Making a little urine.  Vol xs, Acid/base/k ok.  Mechanics of CRRT ok.  K ok now, put on 4 K 2 Cardiogenic schock  bp stable 3 Anemia drop Hb, check fe follow 4 HPTH check PTH 5 Cardiomyopathy.  6 VDRF per CCM . Try to lower vol P CRRT, follow lytes, check Fe, PTH    LOS: 1 day   Aalivia Mcgraw L 06/09/2014,7:26 AM

## 2014-06-09 NOTE — Progress Notes (Signed)
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 Maintained on dobutamine 38mcg/kg/min.   Past medical h/o is notable for CKD with baseline Cr 3.0, cocaine abuse, DM2, AF on amio, VT. He has not been candidate for advanced therapies due to cocaine abuse (quit 4 months ago) and renal failure. Started on dobutamine. Moved to [place] to try to stay off cocaine.   On Oct 14 seen at [facility]. CR was 3.3 (on 04/16/2014 Cr was 1.6 at [facility] on discharge) and potassium was low. Kcl was increased. Over past week increased dyspnea and edema. NYHA IIIB symptoms at baseline.   10/30 Admitted with rep failure with wide complex bradycardia. K 6.8 and Cr 4.6. Intubated and CVVHD started.  Remains intubated. Weaning from vent. Awake and alert. Wants ETT out. Not making much urine. Co-ox yesterday 49% on dobutamine 5.    Objective:   Weight Range:  Vital Signs:   Temp:  [93.8 F (34.3 C)-97.7 F (36.5 C)] 96.9 F (36.1 C) (10/31 0715) Pulse Rate:  [37-113] 86 (10/31 0810) Resp:  [15-32] 18 (10/31 0810) BP: (70-134)/(50-105) 111/73 mmHg (10/31 0715) SpO2:  [97 %-100 %] 99 % (10/31 0810) FiO2 (%):  [40 %-100 %] 40 % (10/31 0810) Weight:  [112.4 kg (247 lb 12.8 oz)-127.007 kg (280 lb)] 114.1 kg (251 lb 8.7 oz) (10/31 0402) Last BM Date:  (UTA)  Weight change: Filed Weights   06/08/14 1002 06/08/14 1900 06/09/14 0402  Weight: 127.007 kg (280 lb) 112.4 kg (247 lb 12.8 oz) 114.1 kg (251 lb 8.7 oz)    Intake/Output:   Intake/Output Summary (Last 24 hours) at 06/09/14 0936 Last data filed at 06/09/14 0800  Gross per 24 hour  Intake 2219.04 ml  Output   1003 ml  Net 1216.04 ml     Physical Exam: General: Intubated. Awke. Bair hugger in place HEENT: normal  Neck: supple. RIJ trialysis catheter Carotids 2+ bilat; no bruits. No lymphadenopathy or thryomegaly appreciated.  Cor: PMI laterally displaced.  Regular rate & rhythm. 2/6 MR +s3  Lungs: + crackles  Abdomen: soft, nontender, + distended. No hepatosplenomegaly. No bruits or masses. Good bowel sounds.  Extremities: no cyanosis, clubbing, rash, 2-3+ edema  Neuro: alert & orientedx3, cranial nerves grossly intact. moves all 4 extremities w/o difficulty. Affect pleasant   Telemetry: SR 80s  Labs: Basic Metabolic Panel:  Recent Labs Lab 06/08/14 1040 06/08/14 1121 06/08/14 1216 06/08/14 1600 06/08/14 2000 06/09/14 0236 06/09/14 0358  NA 130* 129* 130* 131* 131*  --  131*  K 7.1* 6.8* 7.0* 6.3* 4.9  --  3.5*  CL 88* 99 89* 92* 94*  --  92*  CO2 13*  --  11* 19 22  --  26  GLUCOSE 147* 143* 199* 182* 175*  --  150*  BUN 88* 84* 87* 85* 65*  --  50*  CREATININE 4.36* 4.60* 4.40* 4.44* 3.51*  --  2.56*  CALCIUM 9.7  --  10.8* 10.1 9.5  --  8.6  MG  --   --  1.8 1.8  --  2.1 2.0  PHOS  --   --  5.5* 5.2* 3.8 3.2 3.0    Liver Function Tests:  Recent Labs Lab 06/08/14 1040 06/08/14 1216 06/08/14 1600 06/08/14 2000 06/09/14 0358  AST 85* 98*  --   --  492*  ALT 66* 76*  --   --  282*  ALKPHOS 95 94  --   --  77  BILITOT 3.8* 3.9*  --   --  2.8*  PROT 8.6* 8.4*  --   --  7.1  ALBUMIN 3.4* 3.3* 3.2* 3.1* 3.0*   No results found for this basename: LIPASE, AMYLASE,  in the last 168 hours No results found for this basename: AMMONIA,  in the last 168 hours  CBC:  Recent Labs Lab 06/08/14 1040 06/08/14 1121 06/08/14 1216 06/09/14 0358  WBC 7.2  --  7.3 5.9  NEUTROABS  --   --  6.2  --   HGB 11.6* 15.6 12.0* 9.3*  HCT 36.9* 46.0 38.6* 29.1*  MCV 76.6*  --  78.8 75.4*  PLT 202  --  178 161    Cardiac Enzymes:  Recent Labs Lab 06/08/14 1040  TROPONINI <0.30    BNP: BNP (last 3 results)  Recent Labs  06/08/14 1040  PROBNP 11335.0*     Other results:    Imaging: US Renal  06/08/2014   CLINICAL DATA:  Chronic kidney disease.  EXAM: RENAL/URINARY TRACT ULTRASOUND COMPLETE  COMPARISON:  None.   FINDINGS: Right Kidney:  Length: 11.7 cm in length. Echogenicity within normal limits. No mass or hydronephrosis visualized.  Left Kidney:  Length: 11.1 cm in length. Echogenicity within normal limits. No mass or hydronephrosis visualized.  Bladder:  The bladder cannot be visualized. This likely represents that it is completely decompressed.  IMPRESSION: Within normal limits.  The bladder is most likely completely decompressed by the Foley catheter since it was not visualized.   Electronically Signed   By: Maryclare Bean M.D.   On: 06/08/2014 16:35   Dg Chest Portable 1 View  06/08/2014   CLINICAL DATA:  Intubation. Diabetes. Heart failure. Chronic kidney disease.  EXAM: PORTABLE CHEST - 1 VIEW  COMPARISON:  06/08/2014  FINDINGS: A nasogastric tube extends down to the stomach. A right-sided PICC line terminates over the SVC.  There is some tubing projecting over the lower neck. This is about 2.5 cm above the thoracic inlet. If this is an endotracheal tube, it probably needs to be advanced about 7 cm.  Moderate enlargement of the cardiopericardial silhouette. Low lung volumes.  IMPRESSION: 1. Tubing projects over the lower neck, well above the thoracic inlet. If this is an endotracheal tube, it probably needs to be advanced about 7 cm. 2. Moderate enlargement of the cardiopericardial silhouette. These results will be called to the ordering clinician or representative by the Radiologist Assistant, and communication documented in the PACS or zVision Dashboard.   Electronically Signed   By: Herbie Baltimore M.D.   On: 06/08/2014 12:45   Dg Chest Port 1 View  06/08/2014   CLINICAL DATA:  Shortness of Breath beginning last night with nausea starting this morning. Initial encounter.  EXAM: PORTABLE CHEST - 1 VIEW  COMPARISON:  None.  FINDINGS: 1023 hrs. Right PICC line tip projects at innominate vein confluence. Lungs are clear without edema or focal airspace consolidation. No pleural effusion. The cardio pericardial  silhouette is enlarged. Imaged bony structures of the thorax are intact. Telemetry leads overlie the chest.  IMPRESSION: Enlargement of the cardiopericardial silhouette without edema or focal airspace consolidation.   Electronically Signed   By: Kennith Center M.D.   On: 06/08/2014 10:59      Medications:     Scheduled Medications: . antiseptic oral rinse  7 mL Mouth Rinse QID  . calcium gluconate 1 GM IV  1  g Intravenous Once  . chlorhexidine  15 mL Mouth Rinse BID  . feeding supplement (PRO-STAT SUGAR FREE 64)  60 mL Per Tube QID  . furosemide  160 mg Intravenous Q6H  . insulin aspart  2-6 Units Subcutaneous 6 times per day  . pantoprazole (PROTONIX) IV  40 mg Intravenous Q24H     Infusions: . DOBUTamine 5 mcg/kg/min (06/08/14 1415)  . feeding supplement (NEPRO CARB STEADY) 1,000 mL (06/08/14 1738)  . fentaNYL infusion INTRAVENOUS 200 mcg/hr (06/09/14 0157)  . heparin 10,000 units/ 20 mL infusion syringe 1,700 Units/hr (06/09/14 0655)  . midazolam (VERSED) infusion Stopped (06/08/14 2045)  . dialysis replacement fluid (prismasate) 800 mL/hr at 06/09/14 0606  . dialysis replacement fluid (prismasate) 700 mL/hr at 06/09/14 0606  . dialysate (PRISMASATE) 2,000 mL/hr at 06/09/14 0848     PRN Medications:  fentaNYL, heparin, heparin, midazolam, midazolam, sodium chloride   Assessment:   1. Acute respiratory failure  2. A/c systolic HF due to NICM EF < 15%  3. Acute on chronic renal failure stage IV  4. Hyperkalemia  5. Cardiogenic shock  6. DM2  7. Cocaine abuse in remission 3-4 months  8. H/o gout  9. Supratherapeutic INR  10. LV thrombus  11. PAF on amio  12. H/o VT   Plan/Discussion:    Volume status improved on CVVHD but still with significant volume overload. CVVHD rate increased this am. Hopefully can extubate soon as volume status improves. Co-ox yesterday suggestive of persistent shock. Will repeat today. He is on fairly high dose of dobutamine as is so  little room to move but could go to 7.5 if no other options.    Options are very limited as he cannot get advanced therapies and wouldn't be candidate for outpatient HD. Thus IABP is not an option. He has clearly stated he would not want long-term intubation, PEG, trach or frequent shocks.   I discussed case with his primary cardiologist, [facility]up[facility] who agrees.  The patient is critically ill with multiple organ systems failure and requires high complexity decision making for assessment and support, frequent evaluation and titration of therapies, application of advanced monitoring technologies and extensive interpretation of multiple databases.   Critical Care Time devoted to patient care services described in this note is 35 Minutes.   Length of Stay: 1   Arvilla Meresaniel Kienna Moncada MD 06/09/2014, 9:36 AM  Advanced Heart Failure Team Pager [phone] (M-F; 7a - 4p)  Please contact [facility] for night-coverage after hours (4p -7a ) and weekends on [email or web address]

## 2014-06-09 NOTE — Progress Notes (Signed)
Chaplain responded to page from pt nurse. Nurse informed chaplain that family just experienced loss of another family member earlier today and the family came in from [place]. At bedside were daughter, son-in-law, ex-wife, and ex-mother-in-law. Ex-wife's current husband suggested the family come down to see pt and he passed away this morning. Nurse asked about potential housing overnight for family. Chaplain brought list of hotels, offered hospitality, offered prayer, and made family aware of her presence should they need it. Chaplain recommends follow up. Page chaplain if needed.   06/09/14 1900  Clinical Encounter Type  Visited With Patient and family together;Health care provider  Visit Type Follow-up;Social support;Spiritual support  Referral From Nurse  Consult/Referral To Chaplain  Spiritual Encounters  Spiritual Needs Prayer;Emotional  Stress Factors  Family Stress Factors Major life changes;Loss;Family relationships  Jiles Harold 06/09/2014 7:36 PM

## 2014-06-09 NOTE — Progress Notes (Addendum)
Pt AM lab work back. Potassium has dropped from admission from 7.0 to 3.5 this am. New orders received to change Prismasol solution bags from 0/2.5 to 4/2.5 Will continue to monitor.

## 2014-06-09 NOTE — Progress Notes (Signed)
50cc of IV Versed wasted in the sink with Allene Dillon, RN as second witness.

## 2014-06-09 NOTE — Progress Notes (Signed)
Pharmacist Heart Failure Core Measure Documentation  Assessment: [name] has an EF documented as 15%.  Rationale: Heart failure patients with left ventricular systolic dysfunction (LVSD) and an EF < 40% should be prescribed an angiotensin converting enzyme inhibitor (ACEI) or angiotensin receptor blocker (ARB) at discharge unless a contraindication is documented in the medical record.  This patient is not currently on an ACEI or ARB for HF.  This note is being placed in the record in order to provide documentation that a contraindication to the use of these agents is present for this encounter.  ACE Inhibitor or Angiotensin Receptor Blocker is contraindicated (specify all that apply)  []   ACEI allergy AND ARB allergy []   Angioedema []   Moderate or severe aortic stenosis []   Hyperkalemia []   Hypotension []   Renal artery stenosis [x]   Worsening renal function, preexisting renal disease or dysfunction   Fredrik Rigger 06/09/2014 12:58 PM

## 2014-06-09 NOTE — Progress Notes (Addendum)
PULMONARY / CRITICAL CARE MEDICINE   Name: [name] MRN: [id number] DOB: Dec 24, 1950    ADMISSION DATE:  06/08/2014 CONSULTATION DATE:  06/08/2014  REFERRING MD :  EDP  CHIEF COMPLAINT:  Resp failure and CHF  INITIAL PRESENTATION: 63 y/o M with PMH of DM, AF, Cocaine abuse & end stage CHF, EF of 15% who presented with renal failure, associated hyperkalemia, pulmonary edema, and acute respiratory failure.     STUDIES:    SIGNIFICANT EVENTS: 10/30  Admitted with VDRF due to pulmonary edema, renal fx, anasarca 10/31  Weaning on PSV, goal for volume removal with CVVHD    SUBJECTIVE: Pt denies pain.  RN reports no acute events, weaning on PSV 8/5 able to pull 1.7 L volume on command, rest volumes 350-450  VITAL SIGNS: Temp:  [93.8 F (34.3 C)-97.7 F (36.5 C)] 96.9 F (36.1 C) (10/31 0715) Pulse Rate:  [37-113] 86 (10/31 0810) Resp:  [15-32] 18 (10/31 0810) BP: (70-122)/(50-105) 111/73 mmHg (10/31 0715) SpO2:  [97 %-100 %] 99 % (10/31 0810) FiO2 (%):  [40 %-100 %] 40 % (10/31 0810) Weight:  [247 lb 12.8 oz (112.4 kg)-251 lb 8.7 oz (114.1 kg)] 251 lb 8.7 oz (114.1 kg) (10/31 0402)  HEMODYNAMICS: CVP:  [14 mmHg-32 mmHg] 19 mmHg  VENTILATOR SETTINGS: Vent Mode:  [-] CPAP;PSV FiO2 (%):  [40 %-100 %] 40 % Set Rate:  [16 bmp] 16 bmp Vt Set:  [620 mL] 620 mL PEEP:  [5 cmH20] 5 cmH20 Pressure Support:  [8 cmH20] 8 cmH20 Plateau Pressure:  [16 cmH20-21 cmH20] 17 cmH20  INTAKE / OUTPUT:  Intake/Output Summary (Last 24 hours) at 06/09/14 1019 Last data filed at 06/09/14 1000  Gross per 24 hour  Intake 2219.04 ml  Output   1064 ml  Net 1155.04 ml    PHYSICAL EXAMINATION: General:  Chronically ill appearing male in NAD Neuro:  Awake and interactive, moves all ext to command. HEENT:  [place]/AT, PERRL, EOM-I and MMM. Cardiovascular:  s1s2 rrr, no M/R/G. Lungs:  resp's even/non-labored on vent, lungs bilaterally distant, crackles lower Abdomen:  Soft, NT, ND and +BS,  pitting abd edema  Musculoskeletal:  2+ edema bilaterally. Skin:  Intact.  LABS:  CBC  Recent Labs Lab 06/08/14 1040 06/08/14 1121 06/08/14 1216 06/09/14 0358  WBC 7.2  --  7.3 5.9  HGB 11.6* 15.6 12.0* 9.3*  HCT 36.9* 46.0 38.6* 29.1*  PLT 202  --  178 161   Coag's  Recent Labs Lab 06/08/14 1216 06/08/14 1638 06/09/14 0358  APTT 43*  --  192*  INR 4.75* 2.79* 3.25*   BMET  Recent Labs Lab 06/08/14 1600 06/08/14 2000 06/09/14 0358  NA 131* 131* 131*  K 6.3* 4.9 3.5*  CL 92* 94* 92*  CO2 19 22 26   BUN 85* 65* 50*  CREATININE 4.44* 3.51* 2.56*  GLUCOSE 182* 175* 150*   Electrolytes  Recent Labs Lab 06/08/14 1600 06/08/14 2000 06/09/14 0236 06/09/14 0358  CALCIUM 10.1 9.5  --  8.6  MG 1.8  --  2.1 2.0  PHOS 5.2* 3.8 3.2 3.0   Sepsis Markers  Recent Labs Lab 06/08/14 1219  LATICACIDVEN 10.7*   ABG  Recent Labs Lab 06/08/14 1501  PHART 7.298*  PCO2ART 32.9*  PO2ART 197.0*   Liver Enzymes  Recent Labs Lab 06/08/14 1040 06/08/14 1216 06/08/14 1600 06/08/14 2000 06/09/14 0358  AST 85* 98*  --   --  492*  ALT 66* 76*  --   --  282*  ALKPHOS 95 94  --   --  77  BILITOT 3.8* 3.9*  --   --  2.8*  ALBUMIN 3.4* 3.3* 3.2* 3.1* 3.0*   Cardiac Enzymes  Recent Labs Lab 06/08/14 1040  TROPONINI <0.30  PROBNP 11335.0*   Glucose  Recent Labs Lab 06/08/14 1250 06/08/14 1508 06/08/14 1939 06/09/14 0001 06/09/14 0354 06/09/14 0711  GLUCAP 185* 159* 172* 185* 153* 129*    Imaging US Renal  06/08/2014   CLINICAL DATA:  Chronic kidney disease.  EXAM: RENAL/URINARY TRACT ULTRASOUND COMPLETE  COMPARISON:  None.  FINDINGS: Right Kidney:  Length: 11.7 cm in length. Echogenicity within normal limits. No mass or hydronephrosis visualized.  Left Kidney:  Length: 11.1 cm in length. Echogenicity within normal limits. No mass or hydronephrosis visualized.  Bladder:  The bladder cannot be visualized. This likely represents that it is completely  decompressed.  IMPRESSION: Within normal limits.  The bladder is most likely completely decompressed by the Foley catheter since it was not visualized.   Electronically Signed   By: Maryclare Bean M.D.   On: 06/08/2014 16:35   Dg Chest Portable 1 View  06/08/2014   CLINICAL DATA:  Intubation. Diabetes. Heart failure. Chronic kidney disease.  EXAM: PORTABLE CHEST - 1 VIEW  COMPARISON:  06/08/2014  FINDINGS: A nasogastric tube extends down to the stomach. A right-sided PICC line terminates over the SVC.  There is some tubing projecting over the lower neck. This is about 2.5 cm above the thoracic inlet. If this is an endotracheal tube, it probably needs to be advanced about 7 cm.  Moderate enlargement of the cardiopericardial silhouette. Low lung volumes.  IMPRESSION: 1. Tubing projects over the lower neck, well above the thoracic inlet. If this is an endotracheal tube, it probably needs to be advanced about 7 cm. 2. Moderate enlargement of the cardiopericardial silhouette. These results will be called to the ordering clinician or representative by the Radiologist Assistant, and communication documented in the PACS or zVision Dashboard.   Electronically Signed   By: Herbie Baltimore M.D.   On: 06/08/2014 12:45   Dg Chest Port 1 View  06/08/2014   CLINICAL DATA:  Shortness of Breath beginning last night with nausea starting this morning. Initial encounter.  EXAM: PORTABLE CHEST - 1 VIEW  COMPARISON:  None.  FINDINGS: 1023 hrs. Right PICC line tip projects at innominate vein confluence. Lungs are clear without edema or focal airspace consolidation. No pleural effusion. The cardio pericardial silhouette is enlarged. Imaged bony structures of the thorax are intact. Telemetry leads overlie the chest.  IMPRESSION: Enlargement of the cardiopericardial silhouette without edema or focal airspace consolidation.   Electronically Signed   By: Kennith Center M.D.   On: 06/08/2014 10:59     ASSESSMENT /  PLAN:  PULMONARY OETT 10/30>>> A:  Acute Respiratory Failure -  In setting of CHF, acute pulmonary edema Pulmonary Edema  P:   Full vent support Daily SBT / WUA Trend CXR Wean PEEP/FiO2 for sats > 92% Hold weaning til volume negative.  CARDIOVASCULAR CVL R PICC line (old)>>> A:  Acute on Chronic CHF -  Due to NICM, EF 15%, on dobutamine at home, end stage. Hx Cocaine Abuse - in remission x 3-4 months Bradycardia - wide complex with elevated K AF - on amio & coumadin Hx VT P:  Continue Dobutamine at 5. HD for hyperkalemia and volume CHF Team following, appreciate input Continue heparin gtt, hold home coumadin Repeat Co-Ox (per Cards)  RENAL  A:   Acute on chronic renal failure - cr at [facility] on 04/16/14 was 1.6. Hypokalemia P:   Nephrology following, appreciate input Continue CVVHD for volume removal, goal -50 ml/hr 10/31 4K bath started 10/31  GASTROINTESTINAL A:   No active issues P:   Continue TF SUP:  protonix NPO  HEMATOLOGIC A:   Coagulopathy - on coumadin for AFIB, supratherapeutic on admission s/p FFP P:  Continue heparin per pharmacy   INFECTIOUS A:   No active issues. P:   Monitor fever curve / leukocytosis   ENDOCRINE A:   DM   P:   SSI  NEUROLOGIC A:   ICU associated discomfort  P:   RASS goal: 0 Fentanyl drip Versed drip   FAMILY  Updates:  Code Status:  Patient indicated prior to intubation that he would want full code for now but no trach/peg.  TODAY'S SUMMARY: 63 y/o M with hx of cocaine abuse with resultant NICM admitted with acute on chronic CHF / renal failure.  Weaning on PSV, hopeful to extubate once volume removed.    Canary Brim, NP-C [facility] Pgr: [phone] or [phone] 06/09/2014, 10:19 AM  Attending Note:  I have examined the patient, reviewed the notes, labs, studies. I have discussed the patient with B Ollis.  I agree with the data, assessment and plans as amended by me in the note  above.  The patient is critically ill with multiple organ systems failure and requires high complexity decision making for assessment and support, frequent evaluation and titration of therapies, application of advanced monitoring technologies and extensive interpretation of multiple databases. My critical care time is 30 minutes independent of procedures or care given by other providers.  Levy Pupa, MD, PhD 06/09/2014, 6:12 PM [facility] [phone] or if no answer [phone]

## 2014-06-10 ENCOUNTER — Inpatient Hospital Stay (HOSPITAL_COMMUNITY): Payer: Medicare HMO

## 2014-06-10 DIAGNOSIS — N189 Chronic kidney disease, unspecified: Secondary | ICD-10-CM

## 2014-06-10 LAB — COMPREHENSIVE METABOLIC PANEL
ALT: 363 U/L — ABNORMAL HIGH (ref 0–53)
AST: 417 U/L — ABNORMAL HIGH (ref 0–37)
Albumin: 3.4 g/dL — ABNORMAL LOW (ref 3.5–5.2)
Alkaline Phosphatase: 95 U/L (ref 39–117)
Anion gap: 12 (ref 5–15)
BILIRUBIN TOTAL: 3.2 mg/dL — AB (ref 0.3–1.2)
BUN: 25 mg/dL — ABNORMAL HIGH (ref 6–23)
CHLORIDE: 98 meq/L (ref 96–112)
CO2: 25 meq/L (ref 19–32)
Calcium: 8.7 mg/dL (ref 8.4–10.5)
Creatinine, Ser: 1.71 mg/dL — ABNORMAL HIGH (ref 0.50–1.35)
GFR calc Af Amer: 47 mL/min — ABNORMAL LOW (ref >90)
GFR calc non Af Amer: 40 mL/min — ABNORMAL LOW (ref >90)
Glucose, Bld: 93 mg/dL (ref 70–99)
Potassium: 5.1 mEq/L (ref 3.7–5.3)
SODIUM: 135 meq/L — AB (ref 137–147)
Total Protein: 8.8 g/dL — ABNORMAL HIGH (ref 6.0–8.3)

## 2014-06-10 LAB — URINE CULTURE
Colony Count: NO GROWTH
Culture: NO GROWTH

## 2014-06-10 LAB — CARBOXYHEMOGLOBIN
Carboxyhemoglobin: 1 % (ref 0.5–1.5)
Methemoglobin: 1.1 % (ref 0.0–1.5)
O2 Saturation: 51.8 %
Total hemoglobin: 12 g/dL — ABNORMAL LOW (ref 13.5–18.0)

## 2014-06-10 LAB — RENAL FUNCTION PANEL
ALBUMIN: 3.1 g/dL — AB (ref 3.5–5.2)
Anion gap: 13 (ref 5–15)
BUN: 25 mg/dL — ABNORMAL HIGH (ref 6–23)
CO2: 25 mEq/L (ref 19–32)
Calcium: 8.4 mg/dL (ref 8.4–10.5)
Chloride: 96 mEq/L (ref 96–112)
Creatinine, Ser: 1.76 mg/dL — ABNORMAL HIGH (ref 0.50–1.35)
GFR calc non Af Amer: 39 mL/min — ABNORMAL LOW (ref >90)
GFR, EST AFRICAN AMERICAN: 45 mL/min — AB (ref >90)
GLUCOSE: 177 mg/dL — AB (ref 70–99)
PHOSPHORUS: 3.3 mg/dL (ref 2.3–4.6)
Potassium: 4.7 mEq/L (ref 3.7–5.3)
SODIUM: 134 meq/L — AB (ref 137–147)

## 2014-06-10 LAB — APTT: aPTT: >200 seconds (ref 24–37)

## 2014-06-10 LAB — POCT ACTIVATED CLOTTING TIME
ACTIVATED CLOTTING TIME: 185 s
ACTIVATED CLOTTING TIME: 242 s
ACTIVATED CLOTTING TIME: 248 s
ACTIVATED CLOTTING TIME: 225 s
ACTIVATED CLOTTING TIME: 225 s
ACTIVATED CLOTTING TIME: 208 s
Activated Clotting Time: 214 seconds

## 2014-06-10 LAB — CBC
HCT: 37 % — ABNORMAL LOW (ref 39.0–52.0)
HEMOGLOBIN: 11.3 g/dL — AB (ref 13.0–17.0)
MCH: 24.4 pg — AB (ref 26.0–34.0)
MCHC: 30.5 g/dL (ref 30.0–36.0)
MCV: 79.7 fL (ref 78.0–100.0)
Platelets: 195 10*3/uL (ref 150–400)
RBC: 4.64 MIL/uL (ref 4.22–5.81)
RDW: 21 % — ABNORMAL HIGH (ref 11.5–15.5)
WBC: 11.5 10*3/uL — ABNORMAL HIGH (ref 4.0–10.5)

## 2014-06-10 LAB — GLUCOSE, CAPILLARY
GLUCOSE-CAPILLARY: 120 mg/dL — AB (ref 70–99)
GLUCOSE-CAPILLARY: 144 mg/dL — AB (ref 70–99)
GLUCOSE-CAPILLARY: 155 mg/dL — AB (ref 70–99)
GLUCOSE-CAPILLARY: 171 mg/dL — AB (ref 70–99)
GLUCOSE-CAPILLARY: 98 mg/dL (ref 70–99)

## 2014-06-10 LAB — PHOSPHORUS
PHOSPHORUS: 3 mg/dL (ref 2.3–4.6)
PHOSPHORUS: 2.8 mg/dL (ref 2.3–4.6)

## 2014-06-10 LAB — MAGNESIUM
Magnesium: 2.5 mg/dL (ref 1.5–2.5)
Magnesium: 2.5 mg/dL (ref 1.5–2.5)
Magnesium: 2.5 mg/dL (ref 1.5–2.5)

## 2014-06-10 LAB — PROTIME-INR
INR: 3.71 — AB (ref 0.00–1.49)
PROTHROMBIN TIME: 37.1 s — AB (ref 11.6–15.2)

## 2014-06-10 MED ORDER — PIPERACILLIN-TAZOBACTAM 3.375 G IVPB 30 MIN
3.3750 g | Freq: Four times a day (QID) | INTRAVENOUS | Status: DC
  Administered 2014-06-10 – 2014-06-12 (×9): 3.375 g via INTRAVENOUS
  Filled 2014-06-10 (×12): qty 50

## 2014-06-10 MED ORDER — VANCOMYCIN HCL 10 G IV SOLR
1250.0000 mg | INTRAVENOUS | Status: DC
  Administered 2014-06-10 – 2014-06-13 (×4): 1250 mg via INTRAVENOUS
  Filled 2014-06-10 (×4): qty 1250

## 2014-06-10 MED ORDER — CETYLPYRIDINIUM CHLORIDE 0.05 % MT LIQD
7.0000 mL | Freq: Two times a day (BID) | OROMUCOSAL | Status: DC
  Administered 2014-06-10 – 2014-06-20 (×15): 7 mL via OROMUCOSAL

## 2014-06-10 MED ORDER — DOBUTAMINE IN D5W 4-5 MG/ML-% IV SOLN
5.0000 ug/kg/min | INTRAVENOUS | Status: DC
  Administered 2014-06-10 – 2014-06-12 (×2): 5 ug/kg/min via INTRAVENOUS
  Administered 2014-06-14: 7.5 ug/kg/min via INTRAVENOUS
  Filled 2014-06-10 (×5): qty 250

## 2014-06-10 MED ORDER — WHITE PETROLATUM GEL
Status: AC
Start: 2014-06-10 — End: 2014-06-10
  Administered 2014-06-10: 14:00:00
  Filled 2014-06-10: qty 5

## 2014-06-10 MED ORDER — FENTANYL CITRATE 0.05 MG/ML IJ SOLN: 25.0000 ug | INTRAMUSCULAR | Status: DC | PRN

## 2014-06-10 MED ORDER — FENTANYL BOLUS VIA INFUSION: 25.0000 ug | INTRAVENOUS | Status: DC | PRN

## 2014-06-10 NOTE — Progress Notes (Signed)
While during down time with EPIC pt has spike a temp. [facility] MD notified and orders for stat blood cultures done. Ice packs placed on pt. Will continue to monitor.

## 2014-06-10 NOTE — Progress Notes (Signed)
PULMONARY / CRITICAL CARE MEDICINE   Name: [name] MRN: [id number] DOB: [birth year]    ADMISSION DATE:  06/08/2014 CONSULTATION DATE:  06/08/2014  REFERRING MD :  EDP  CHIEF COMPLAINT:  Resp failure and CHF  INITIAL PRESENTATION: 63 y/o M with PMH of DM, AF, Cocaine abuse & end stage CHF, EF of 15% who presented with renal failure, associated hyperkalemia, pulmonary edema, and acute respiratory failure.    STUDIES:    SIGNIFICANT EVENTS: 10/30  Admitted with VDRF due to pulmonary edema, renal fx, anasarca 10/31  Weaning on PSV, goal for volume removal with CVVHD 11/01  Neg 1 L in last 24 hours  SUBJECTIVE: Net neg 1L in last 24 hours, tmax 102, cultured overnight  VITAL SIGNS: Temp:  [96.8 F (36 C)-102 F (38.9 C)] 100.6 F (38.1 C) (11/01 0700) Pulse Rate:  [82-120] 97 (11/01 0803) Resp:  [10-27] 11 (11/01 0803) BP: (82-158)/(56-121) 109/72 mmHg (11/01 0803) SpO2:  [98 %-100 %] 100 % (11/01 0803) FiO2 (%):  [40 %] 40 % (11/01 0803) Weight:  [250 lb 3.6 oz (113.5 kg)] 250 lb 3.6 oz (113.5 kg) (11/01 0428)  HEMODYNAMICS: CVP:  [18 mmHg-19 mmHg] 19 mmHg  VENTILATOR SETTINGS: Vent Mode:  [-] CPAP;PSV FiO2 (%):  [40 %] 40 % Set Rate:  [16 bmp] 16 bmp Vt Set:  [620 mL] 620 mL PEEP:  [5 cmH20] 5 cmH20 Pressure Support:  [5 cmH20] 5 cmH20 Plateau Pressure:  [14 cmH20-21 cmH20] 18 cmH20  INTAKE / OUTPUT:  Intake/Output Summary (Last 24 hours) at 06/10/14 0838 Last data filed at 06/10/14 0700  Gross per 24 hour  Intake 1437.5 ml  Output   2433 ml  Net -995.5 ml    PHYSICAL EXAMINATION: General:  Chronically ill appearing male in NAD Neuro:  Awake and interactive, moves all ext to command. HEENT:  [place]/AT, PERRL, EOM-I and MMM. Cardiovascular:  s1s2 rrr, no M/R/G. Lungs:  resp's even/non-labored on vent, lungs bilaterally distant, faint crackles lower (improved) Abdomen:  Soft, NT, ND and +BS, pitting abd edema  Musculoskeletal:  2+ edema  bilaterally. Skin:  Intact.  LABS:  CBC  Recent Labs Lab 06/08/14 1216 06/09/14 0358 06/10/14 0430  WBC 7.3 5.9 11.5*  HGB 12.0* 9.3* 11.3*  HCT 38.6* 29.1* 37.0*  PLT 178 161 195   Coag's  Recent Labs Lab 06/08/14 1216 06/08/14 1638 06/09/14 0358 06/10/14 0430  APTT 43*  --  192* >200*  INR 4.75* 2.79* 3.25* 3.71*   BMET  Recent Labs Lab 06/09/14 0358 06/09/14 1436 06/10/14 0430  NA 131* 133* 135*  K 3.5* 4.5 5.1  CL 92* 95* 98  CO2 26 24 25   BUN 50* 35* 25*  CREATININE 2.56* 2.16* 1.71*  GLUCOSE 150* 201* 93   Electrolytes  Recent Labs Lab 06/09/14 0358 06/09/14 1436 06/10/14 0305 06/10/14 0430  CALCIUM 8.6 8.6  --  8.7  MG 2.0 2.3 2.5 2.5  PHOS 3.0 2.6 3.0 2.8   Sepsis Markers  Recent Labs Lab 06/08/14 1219  LATICACIDVEN 10.7*   ABG  Recent Labs Lab 06/08/14 1501  PHART 7.298*  PCO2ART 32.9*  PO2ART 197.0*   Liver Enzymes  Recent Labs Lab 06/08/14 1216  06/08/14 2000 06/09/14 0358 06/10/14 0430  AST 98*  --   --  492* 417*  ALT 76*  --   --  282* 363*  ALKPHOS 94  --   --  77 95  BILITOT 3.9*  --   --  2.8*  3.2*  ALBUMIN 3.3*  < > 3.1* 3.0* 3.4*  < > = values in this interval not displayed. Cardiac Enzymes  Recent Labs Lab 06/08/14 1040  TROPONINI <0.30  PROBNP 11335.0*   Glucose  Recent Labs Lab 06/09/14 0711 06/09/14 1106 06/09/14 1517 06/09/14 1925 06/10/14 0018 06/10/14 0351  GLUCAP 129* 129* 170* 200* 144* 98    Imaging No results found.   ASSESSMENT / PLAN:  PULMONARY OETT 10/30>>>11/01 A:  Acute Respiratory Failure -  In setting of CHF, acute pulmonary edema Pulmonary Edema  P:   Extubate to [place], may need bipap support PRN if increased WOB post extubation until volume status improved Trend CXR Wean O2 for sats > 92%  CARDIOVASCULAR CVL R PICC line (old)>>> A:  Acute on Chronic CHF -  Due to NICM, EF 15%, on dobutamine at home, end stage. Hx Cocaine Abuse - in remission x 3-4  months Bradycardia - wide complex with elevated K AF - on amio & coumadin Hx VT P:  Continue Dobutamine at 5. HD for hyperkalemia and volume CHF Team following, appreciate input Continue heparin gtt, hold home coumadin  RENAL A:   Acute on chronic renal failure - cr [facility] on 04/16[facility]14 was 1.6. Hypokalemia P:   Nephrology following, appreciate input Continue CVVHD for volume removal, goal -100 ml/hr  4K bath started 10/31, monitor K closely  GASTROINTESTINAL A:   No active issues P:   SUP:  protonix NPO x 4 hours post extubation, then clears and advance as tolerated  HEMATOLOGIC A:   Coagulopathy - on coumadin for AFIB, supratherapeutic on admission s/p FFP P:  Continue heparin per pharmacy   INFECTIOUS A:   Fever - overnight 11/1 P:   Monitor fever curve / leukocytosis   BCx2 11/1 >>   Abx: Vanco, start date 11/1, day 1/x (empiric) Abx: Zosyn, start date 11/1, day 1/x (empiric)  ENDOCRINE A:   DM   P:   SSI  NEUROLOGIC A:   ICU associated discomfort  P:   RASS goal: 0   FAMILY  Updates: Patient updated, no family at bedside.  Code Status:  Patient indicated prior to intubation that he would want full code for now but no trach/peg.  TODAY'S SUMMARY: 63 y/o M with hx of cocaine abuse with resultant NICM admitted with acute on chronic CHF / renal failure.  Weaning on PSV, extubate to [place] & monitor closely.  Fever overnight, initiated empiric abx with fever of 102.  Unclear age of PICC line, may need line holiday??   Canary BrimBrandi Ollis, NP-C [facility] Pgr: [phone] or [phone] 06/10/2014, 8:38 AM   Attending Note:  I have examined the patient, reviewed the notes, labs, studies. I have discussed the patient with B Ollis. I agree with the data, assessment and plans as amended by me in the note above. The patient is critically ill with multiple organ systems failure and requires high complexity decision making for assessment and support,  frequent evaluation and titration of therapies, application of advanced monitoring technologies and extensive interpretation of multiple databases. My critical care time is 35 minutes independent of procedures or care given by other providers  Levy Pupaobert Trashawn Oquendo, MD, PhD 06/10/2014, 11:00 AM  Pulmonary and Critical Care [phone] or if no answer [phone]

## 2014-06-10 NOTE — Progress Notes (Signed)
ANTIBIOTIC CONSULT NOTE - INITIAL  Pharmacy Consult for zosyn + vancomycin Indication: Empiric, ?bacteremia  No Known Allergies  Patient Measurements: Height: 6' (182.9 cm) Weight: 250 lb 3.6 oz (113.5 kg) IBW/kg (Calculated) : 77.6 Adjusted Body Weight:   Vital Signs: Temp: 100.6 F (38.1 C) (11/01 0700) Temp Source: Core (Comment) (11/01 0000) BP: 109/72 mmHg (11/01 0803) Pulse Rate: 97 (11/01 0803) Intake/Output from previous day: 10/31 0701 - 11/01 0700 In: 1502 [I.V.:358; NG/GT:600; IV Piggyback:264] Out: 2496 [Urine:65] Intake/Output from this shift:    Labs:  Recent Labs  06/08/14 1216  06/09/14 0358 06/09/14 1436 06/10/14 0430  WBC 7.3  --  5.9  --  11.5*  HGB 12.0*  --  9.3*  --  11.3*  PLT 178  --  161  --  195  CREATININE 4.40*  < > 2.56* 2.16* 1.71*  < > = values in this interval not displayed. Estimated Creatinine Clearance: 56 mL/min (by C-G formula based on Cr of 1.71). No results for input(s): VANCOTROUGH, VANCOPEAK, VANCORANDOM, GENTTROUGH, GENTPEAK, GENTRANDOM, TOBRATROUGH, TOBRAPEAK, TOBRARND, AMIKACINPEAK, AMIKACINTROU, AMIKACIN in the last 72 hours.   Microbiology: Recent Results (from the past 720 hour(s))  MRSA PCR Screening     Status: None   Collection Time: 06/08/14  1:16 PM  Result Value Ref Range Status   MRSA by PCR NEGATIVE NEGATIVE Final    Comment:        The GeneXpert MRSA Assay (FDA approved for NASAL specimens only), is one component of a comprehensive MRSA colonization surveillance program. It is not intended to diagnose MRSA infection nor to guide or monitor treatment for MRSA infections.    Medical History: Past Medical History  Diagnosis Date  . Diabetes mellitus without complication   . Chronic systolic heart failure     a) ECHO [facility] 02/2014): EF <15%, multiple apical thrombi, RV mod enlarged, mod MR b. RHC (02/21/14) at [facility]: RA 16, RV 64/9 (15), PA 64/32 (42), PCWP: 24, CI: 1.4  . HTN (hypertension)   . HLD  (hyperlipidemia)   . CKD (chronic kidney disease), stage III   . Apical mural thrombus   . Mitral regurgitation   . Drug use     cocaine  . Atrial fibrillation, chronic     Medications:  Anti-infectives    Start     Dose/Rate Route Frequency Ordered Stop   06/10/14 1000  piperacillin-tazobactam (ZOSYN) IVPB 3.375 g     3.375 g100 mL/hr over 30 Minutes Intravenous Every 6 hours 06/10/14 0918     06/10/14 1000  vancomycin (VANCOCIN) 1,250 mg in sodium chloride 0.9 % 250 mL IVPB     1,250 mg166.7 mL/hr over 90 Minutes Intravenous Every 24 hours 06/10/14 0918       Assessment: 63 yom admitted with SOB. Now with fever and mild leukocytosis overnight to start empiric broad-spectrum antibiotics with vancomycin and zosyn. Pt continues on CVVHDF.   Vanc 11/1>> Zosyn 11/1>>  Goal of Therapy:  Vancomycin trough level 15-20 mcg/ml  Plan:  1. Vancomycin 1250mg  IV Q24H 2. Zosyn 3.375gm IV Q6H 3. F/u renal plans, C&S, clinical status and trough at [facility]  Pakou Rainbow, Drake Leach 06/10/2014,9:18 AM

## 2014-06-10 NOTE — Progress Notes (Signed)
Subjective: Interval History: has no complaint.  Objective: Vital signs in last 24 hours: Temp:  [96.8 F (36 C)-102 F (38.9 C)] 100.6 F (38.1 C) (11/01 0700) Pulse Rate:  [82-120] 111 (11/01 0700) Resp:  [10-27] 16 (11/01 0700) BP: (82-158)/(56-121) 118/78 mmHg (11/01 0700) SpO2:  [98 %-100 %] 100 % (11/01 0700) FiO2 (%):  [40 %] 40 % (11/01 0316) Weight:  [113.5 kg (250 lb 3.6 oz)] 113.5 kg (250 lb 3.6 oz) (11/01 0428) Weight change: -13.507 kg (-29 lb 12.5 oz)  Intake/Output from previous day: 10/31 0701 - 11/01 0700 In: 1502 [I.V.:358; NG/GT:600; IV Piggyback:264] Out: 2496 [Urine:65] Intake/Output this shift:    General appearance: alert, cooperative and moderately obese Resp: diminished breath sounds bilaterally and rales bibasilar Cardio: S1, S2 normal and systolic murmur: holosystolic 2/6, blowing at apex GI: soft, pos bs,liver down 7 cm, obese Extremities: edema 1+  Lab Results:  Recent Labs  06/09/14 0358 06/10/14 0430  WBC 5.9 11.5*  HGB 9.3* 11.3*  HCT 29.1* 37.0*  PLT 161 195   BMET:  Recent Labs  06/09/14 1436 06/10/14 0430  NA 133* 135*  K 4.5 5.1  CL 95* 98  CO2 24 25  GLUCOSE 201* 93  BUN 35* 25*  CREATININE 2.16* 1.71*  CALCIUM 8.6 8.7   No results for input(s): PTH in the last 72 hours. Iron Studies:  Recent Labs  06/09/14 0730  IRON 21*  TIBC 341    Studies/Results: [place] Renal  06/08/2014   CLINICAL DATA:  Chronic kidney disease.  EXAM: RENAL/URINARY TRACT ULTRASOUND COMPLETE  COMPARISON:  None.  FINDINGS: Right Kidney:  Length: 11.7 cm in length. Echogenicity within normal limits. No mass or hydronephrosis visualized.  Left Kidney:  Length: 11.1 cm in length. Echogenicity within normal limits. No mass or hydronephrosis visualized.  Bladder:  The bladder cannot be visualized. This likely represents that it is completely decompressed.  IMPRESSION: Within normal limits.  The bladder is most likely completely decompressed by the  Foley catheter since it was not visualized.   Electronically Signed   By: Maryclare BeanArt  Hoss M.D.   On: 06/08/2014 16:35   Dg Chest Portable 1 View  06/08/2014   CLINICAL DATA:  Intubation. Diabetes. Heart failure. Chronic kidney disease.  EXAM: PORTABLE CHEST - 1 VIEW  COMPARISON:  06/08/2014  FINDINGS: A nasogastric tube extends down to the stomach. A right-sided PICC line terminates over the SVC.  There is some tubing projecting over the lower neck. This is about 2.5 cm above the thoracic inlet. If this is an endotracheal tube, it probably needs to be advanced about 7 cm.  Moderate enlargement of the cardiopericardial silhouette. Low lung volumes.  IMPRESSION: 1. Tubing projects over the lower neck, well above the thoracic inlet. If this is an endotracheal tube, it probably needs to be advanced about 7 cm. 2. Moderate enlargement of the cardiopericardial silhouette. These results will be called to the ordering clinician or representative by the Radiologist Assistant, and communication documented in the PACS or zVision Dashboard.   Electronically Signed   By: Herbie BaltimoreWalt  Liebkemann M.D.   On: 06/08/2014 12:45   Dg Chest Port 1 View  06/08/2014   CLINICAL DATA:  Shortness of Breath beginning last night with nausea starting this morning. Initial encounter.  EXAM: PORTABLE CHEST - 1 VIEW  COMPARISON:  None.  FINDINGS: 1023 hrs. Right PICC line tip projects at innominate vein confluence. Lungs are clear without edema or focal airspace consolidation. No pleural effusion.  The cardio pericardial silhouette is enlarged. Imaged bony structures of the thorax are intact. Telemetry leads overlie the chest.  IMPRESSION: Enlargement of the cardiopericardial silhouette without edema or focal airspace consolidation.   Electronically Signed   By: Kennith Center M.D.   On: 06/08/2014 10:59    I have reviewed the patient's current medications.  Assessment/Plan: 1 AKI/CKD4  Oliguric, vol xs.  tol removal of vol and will ^ as still vol  xs.  Acid/base/K ok. 2 CM on DB stable 3 VDRF  Stable , per CCM 4 Arrhrythmias stable 5 Hx afib 6 Anemia better with vol contraction 7 HPTH pending 8 New fevers cultures done, ?AB P CRRT, lower vol, discussed AB    LOS: 2 days   [name] 06/10/2014,7:33 AM

## 2014-06-10 NOTE — Progress Notes (Signed)
Patient ID: [name], male   DOB: July 16, 1951, 63 y.o.   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 Maintained on dobutamine 61mcg/kg/min.   Past medical h/o is notable for CKD with baseline Cr 3.0, cocaine abuse, DM2, AF on amio, VT. He has not been candidate for advanced therapies due to cocaine abuse (quit 4 months ago) and renal failure. Started on dobutamine. Moved to [place] to try to stay off cocaine.   On Oct 14 seen at [facility]. CR was 3.3 (on 04/16/2014 Cr was 1.6 at [facility] on discharge) and potassium was low. Kcl was increased. Over past week increased dyspnea and edema. NYHA IIIB symptoms at baseline.   10/30 Admitted with rep failure with wide complex bradycardia. K 6.8 and Cr 4.6. Intubated and CVVHD started.  Remains intubated. Awake and alert. Not making much urine. Co-ox 51% on dobutamine 5.  Net -994 fluid balace with CVVH.  CVP 19.  Febrile last night, to start empiric antibiotics.    Objective:   Weight Range:  Vital Signs:   Temp:  [96.8 F (36 C)-102 F (38.9 C)] 100.6 F (38.1 C) (11/01 0700) Pulse Rate:  [82-120] 97 (11/01 0803) Resp:  [10-27] 11 (11/01 0803) BP: (82-158)/(56-121) 109/72 mmHg (11/01 0803) SpO2:  [98 %-100 %] 100 % (11/01 0803) FiO2 (%):  [40 %] 40 % (11/01 0803) Weight:  [250 lb 3.6 oz (113.5 kg)] 250 lb 3.6 oz (113.5 kg) (11/01 0428) Last BM Date:  (UTA)  Weight change: Filed Weights   06/08/14 1900 06/09/14 0402 06/10/14 0428  Weight: 247 lb 12.8 oz (112.4 kg) 251 lb 8.7 oz (114.1 kg) 250 lb 3.6 oz (113.5 kg)    Intake/Output:   Intake/Output Summary (Last 24 hours) at 06/10/14 0844 Last data filed at 06/10/14 0700  Gross per 24 hour  Intake 1437.5 ml  Output   2433 ml  Net -995.5 ml     Physical Exam: General: Intubated. Awke. Bair hugger in place HEENT: normal  Neck: supple. RIJ trialysis catheter.  JVP  14-16. Carotids 2+ bilat; no bruits. No lymphadenopathy or thryomegaly appreciated.  Cor: PMI laterally displaced. Regular rate & rhythm. 2/6 MR +s3  Lungs: + crackles  Abdomen: soft, nontender, + distended. No hepatosplenomegaly. No bruits or masses. Good bowel sounds.  Extremities: no cyanosis, clubbing, rash, 2-3+ edema  Neuro: alert & orientedx3, cranial nerves grossly intact. moves all 4 extremities w/o difficulty. Affect pleasant   Telemetry: SR 90s  Labs: Basic Metabolic Panel:  Recent Labs Lab 06/08/14 1600 06/08/14 2000 06/09/14 0236 06/09/14 0358 06/09/14 1436 06/10/14 0305 06/10/14 0430  NA 131* 131*  --  131* 133*  --  135*  K 6.3* 4.9  --  3.5* 4.5  --  5.1  CL 92* 94*  --  92* 95*  --  98  CO2 19 22  --  26 24  --  25  GLUCOSE 182* 175*  --  150* 201*  --  93  BUN 85* 65*  --  50* 35*  --  25*  CREATININE 4.44* 3.51*  --  2.56* 2.16*  --  1.71*  CALCIUM 10.1 9.5  --  8.6 8.6  --  8.7  MG 1.8  --  2.1 2.0 2.3 2.5 2.5  PHOS 5.2* 3.8 3.2 3.0 2.6 3.0 2.8    Liver Function Tests:  Recent Labs  Lab 06/08/14 1040 06/08/14 1216 06/08/14 1600 06/08/14 2000 06/09/14 0358 06/10/14 0430  AST 85* 98*  --   --  492* 417*  ALT 66* 76*  --   --  282* 363*  ALKPHOS 95 94  --   --  77 95  BILITOT 3.8* 3.9*  --   --  2.8* 3.2*  PROT 8.6* 8.4*  --   --  7.1 8.8*  ALBUMIN 3.4* 3.3* 3.2* 3.1* 3.0* 3.4*   No results for input(s): LIPASE, AMYLASE in the last 168 hours. No results for input(s): AMMONIA in the last 168 hours.  CBC:  Recent Labs Lab 06/08/14 1040 06/08/14 1121 06/08/14 1216 06/09/14 0358 06/10/14 0430  WBC 7.2  --  7.3 5.9 11.5*  NEUTROABS  --   --  6.2  --   --   HGB 11.6* 15.6 12.0* 9.3* 11.3*  HCT 36.9* 46.0 38.6* 29.1* 37.0*  MCV 76.6*  --  78.8 75.4* 79.7  PLT 202  --  178 161 195    Cardiac Enzymes:  Recent Labs Lab 06/08/14 1040  TROPONINI <0.30    BNP: BNP (last 3 results)  Recent Labs  06/08/14 1040  PROBNP 11335.0*      Other results:    Imaging: US Renal  06/08/2014   CLINICAL DATA:  Chronic kidney disease.  EXAM: RENAL/URINARY TRACT ULTRASOUND COMPLETE  COMPARISON:  None.  FINDINGS: Right Kidney:  Length: 11.7 cm in length. Echogenicity within normal limits. No mass or hydronephrosis visualized.  Left Kidney:  Length: 11.1 cm in length. Echogenicity within normal limits. No mass or hydronephrosis visualized.  Bladder:  The bladder cannot be visualized. This likely represents that it is completely decompressed.  IMPRESSION: Within normal limits.  The bladder is most likely completely decompressed by the Foley catheter since it was not visualized.   Electronically Signed   By: Maryclare Bean M.D.   On: 06/08/2014 16:35   Dg Chest Portable 1 View  06/08/2014   CLINICAL DATA:  Intubation. Diabetes. Heart failure. Chronic kidney disease.  EXAM: PORTABLE CHEST - 1 VIEW  COMPARISON:  06/08/2014  FINDINGS: A nasogastric tube extends down to the stomach. A right-sided PICC line terminates over the SVC.  There is some tubing projecting over the lower neck. This is about 2.5 cm above the thoracic inlet. If this is an endotracheal tube, it probably needs to be advanced about 7 cm.  Moderate enlargement of the cardiopericardial silhouette. Low lung volumes.  IMPRESSION: 1. Tubing projects over the lower neck, well above the thoracic inlet. If this is an endotracheal tube, it probably needs to be advanced about 7 cm. 2. Moderate enlargement of the cardiopericardial silhouette. These results will be called to the ordering clinician or representative by the Radiologist Assistant, and communication documented in the PACS or zVision Dashboard.   Electronically Signed   By: Herbie Baltimore M.D.   On: 06/08/2014 12:45   Dg Chest Port 1 View  06/08/2014   CLINICAL DATA:  Shortness of Breath beginning last night with nausea starting this morning. Initial encounter.  EXAM: PORTABLE CHEST - 1 VIEW  COMPARISON:  None.  FINDINGS: 1023  hrs. Right PICC line tip projects at innominate vein confluence. Lungs are clear without edema or focal airspace consolidation. No pleural effusion. The cardio pericardial silhouette is enlarged. Imaged bony structures of the thorax are intact. Telemetry leads overlie the chest.  IMPRESSION: Enlargement of the cardiopericardial silhouette without edema or focal airspace consolidation.   Electronically  Signed   By: Kennith CenterEric  Mansell M.D.   On: 06/08/2014 10:59     Medications:     Scheduled Medications: . amiodarone  200 mg Oral Daily  . antiseptic oral rinse  7 mL Mouth Rinse QID  . calcium gluconate 1 GM IV  1 g Intravenous Once  . chlorhexidine  15 mL Mouth Rinse BID  . feeding supplement (PRO-STAT SUGAR FREE 64)  60 mL Per Tube QID  . insulin aspart  2-6 Units Subcutaneous 6 times per day  . pantoprazole sodium  40 mg Per Tube Daily    Infusions: . DOBUTamine    . feeding supplement (NEPRO CARB STEADY) 1,000 mL (06/08/14 1738)  . fentaNYL infusion INTRAVENOUS 100 mcg/hr (06/10/14 0631)  . heparin 10,000 units/ 20 mL infusion syringe 1,650 Units/hr (06/10/14 16100633)  . midazolam (VERSED) infusion Stopped (06/08/14 2045)  . dialysis replacement fluid (prismasate) 800 mL/hr at 06/10/14 [id number]  . dialysis replacement fluid (prismasate) 700 mL/hr at 06/10/14 0335  . dialysate (PRISMASATE) 2,000 mL/hr at 06/10/14 0715    PRN Medications: fentaNYL, heparin, heparin, midazolam, midazolam, sodium chloride   Assessment:   1. Acute respiratory failure  2. A/c systolic HF due to NICM EF < 15%  3. Acute on chronic renal failure stage IV  4. Hyperkalemia  5. Cardiogenic shock  6. DM2  7. Cocaine abuse in remission 3-4 months  8. H/o gout  9. Supratherapeutic INR  10. LV thrombus  11. PAF on amio  12. H/o VT 13. Fever 11/1   Plan/Discussion:    Still quite volume overloaded by exam and CVP.  Mildly negative yesterday via CVVH.  Has BP room, would increase fluid removal today.   Co-ox  today suggestive of persistent shock. He is on fairly high dose of dobutamine as is so little room to move but will increase to 7.5 today and follow heart rate closely. Would like higher output to try to facilitate renal recovery. Options are very limited as he cannot get advanced therapies and wouldn't be candidate for outpatient HD. Thus IABP is not an option. He has clearly stated he would not want long-term intubation, PEG, trach or frequent shocks.    Febrile this morning, started on empiric antibiotics by CCM.   The patient is critically ill with multiple organ systems failure and requires high complexity decision making for assessment and support, frequent evaluation and titration of therapies, application of advanced monitoring technologies and extensive interpretation of multiple databases.    Length of Stay: 2   Marca Anconaalton Sariyah Corcino MD 06/10/2014, 8:44 AM  Advanced Heart Failure Team Pager [phone] (M-F; 7a - 4p)  Please contact [facility] for night-coverage after hours (4p -7a ) and weekends on [email or web address]

## 2014-06-11 ENCOUNTER — Encounter (HOSPITAL_COMMUNITY): Payer: Self-pay | Admitting: *Deleted

## 2014-06-11 LAB — GLUCOSE, CAPILLARY
GLUCOSE-CAPILLARY: 121 mg/dL — AB (ref 70–99)
GLUCOSE-CAPILLARY: 156 mg/dL — AB (ref 70–99)
Glucose-Capillary: 118 mg/dL — ABNORMAL HIGH (ref 70–99)
Glucose-Capillary: 114 mg/dL — ABNORMAL HIGH (ref 70–99)
Glucose-Capillary: 112 mg/dL — ABNORMAL HIGH (ref 70–99)
Glucose-Capillary: 129 mg/dL — ABNORMAL HIGH (ref 70–99)
Glucose-Capillary: 141 mg/dL — ABNORMAL HIGH (ref 70–99)

## 2014-06-11 LAB — RENAL FUNCTION PANEL
Albumin: 2.8 g/dL — ABNORMAL LOW (ref 3.5–5.2)
Anion gap: 10 (ref 5–15)
BUN: 16 mg/dL (ref 6–23)
CALCIUM: 8.3 mg/dL — AB (ref 8.4–10.5)
CO2: 26 mEq/L (ref 19–32)
CREATININE: 1.3 mg/dL (ref 0.50–1.35)
Chloride: 98 mEq/L (ref 96–112)
GFR calc non Af Amer: 56 mL/min — ABNORMAL LOW (ref >90)
GFR, EST AFRICAN AMERICAN: 65 mL/min — AB (ref >90)
Glucose, Bld: 144 mg/dL — ABNORMAL HIGH (ref 70–99)
PHOSPHORUS: 1.3 mg/dL — AB (ref 2.3–4.6)
Potassium: 4.3 mEq/L (ref 3.7–5.3)
Sodium: 134 mEq/L — ABNORMAL LOW (ref 137–147)

## 2014-06-11 LAB — CBC
HEMATOCRIT: 35.9 % — AB (ref 39.0–52.0)
HEMOGLOBIN: 10.9 g/dL — AB (ref 13.0–17.0)
MCH: 24.5 pg — AB (ref 26.0–34.0)
MCHC: 30.4 g/dL (ref 30.0–36.0)
MCV: 80.7 fL (ref 78.0–100.0)
Platelets: 153 10*3/uL (ref 150–400)
RBC: 4.45 MIL/uL (ref 4.22–5.81)
RDW: 21.3 % — ABNORMAL HIGH (ref 11.5–15.5)
WBC: 10.9 10*3/uL — ABNORMAL HIGH (ref 4.0–10.5)

## 2014-06-11 LAB — POCT ACTIVATED CLOTTING TIME
ACTIVATED CLOTTING TIME: 236 s
ACTIVATED CLOTTING TIME: 208 s
ACTIVATED CLOTTING TIME: 219 s
ACTIVATED CLOTTING TIME: 202 s
ACTIVATED CLOTTING TIME: 225 s
Activated Clotting Time: 191 seconds
Activated Clotting Time: 202 seconds
Activated Clotting Time: 202 seconds
Activated Clotting Time: 202 seconds
Activated Clotting Time: 208 seconds
Activated Clotting Time: 225 seconds
Activated Clotting Time: 231 seconds

## 2014-06-11 LAB — COMPREHENSIVE METABOLIC PANEL
ALT: 380 U/L — AB (ref 0–53)
AST: 360 U/L — AB (ref 0–37)
Albumin: 3 g/dL — ABNORMAL LOW (ref 3.5–5.2)
Alkaline Phosphatase: 87 U/L (ref 39–117)
Anion gap: 14 (ref 5–15)
BILIRUBIN TOTAL: 3 mg/dL — AB (ref 0.3–1.2)
BUN: 20 mg/dL (ref 6–23)
CALCIUM: 8.6 mg/dL (ref 8.4–10.5)
CHLORIDE: 96 meq/L (ref 96–112)
CO2: 25 meq/L (ref 19–32)
CREATININE: 1.49 mg/dL — AB (ref 0.50–1.35)
GFR, EST AFRICAN AMERICAN: 55 mL/min — AB (ref >90)
GFR, EST NON AFRICAN AMERICAN: 48 mL/min — AB (ref >90)
GLUCOSE: 129 mg/dL — AB (ref 70–99)
Potassium: 4.4 mEq/L (ref 3.7–5.3)
Sodium: 135 mEq/L — ABNORMAL LOW (ref 137–147)
Total Protein: 7.9 g/dL (ref 6.0–8.3)

## 2014-06-11 LAB — PROTIME-INR
INR: 3.43 — ABNORMAL HIGH (ref 0.00–1.49)
Prothrombin Time: 34.8 seconds — ABNORMAL HIGH (ref 11.6–15.2)

## 2014-06-11 LAB — PHOSPHORUS: PHOSPHORUS: 2.2 mg/dL — AB (ref 2.3–4.6)

## 2014-06-11 LAB — CARBOXYHEMOGLOBIN
Carboxyhemoglobin: 1.4 % (ref 0.5–1.5)
Methemoglobin: 1 % (ref 0.0–1.5)
O2 SAT: 67.2 %
TOTAL HEMOGLOBIN: 11.1 g/dL — AB (ref 13.5–18.0)

## 2014-06-11 LAB — PARATHYROID HORMONE, INTACT (NO CA): PTH: 168 pg/mL — ABNORMAL HIGH (ref 14–64)

## 2014-06-11 LAB — MAGNESIUM
MAGNESIUM: 2.4 mg/dL (ref 1.5–2.5)
Magnesium: 2.4 mg/dL (ref 1.5–2.5)

## 2014-06-11 LAB — APTT: aPTT: 130 seconds — ABNORMAL HIGH (ref 24–37)

## 2014-06-11 MED ORDER — PANTOPRAZOLE SODIUM 40 MG PO TBEC
40.0000 mg | DELAYED_RELEASE_TABLET | Freq: Every day | ORAL | Status: DC
  Administered 2014-06-11 – 2014-06-28 (×18): 40 mg via ORAL
  Filled 2014-06-11 (×18): qty 1

## 2014-06-11 NOTE — Progress Notes (Signed)
ANTICOAGULATION CONSULT NOTE - Follow Up Consult  Pharmacy Consult for Heparin Indication: atrial fibrillation and and apical thrombus  No Known Allergies  Patient Measurements: Height: 6' (182.9 cm) Weight: 245 lb 2.4 oz (111.2 kg) IBW/kg (Calculated) : 77.6 Heparin Dosing Weight: 92 kg  Vital Signs: Temp: 98.9 F (37.2 C) (11/02 0700) Temp Source: Core (Comment) (11/02 0000) BP: 97/61 mmHg (11/02 0700) Pulse Rate: 91 (11/02 0700)  Labs:  Recent Labs  06/09/14 0358  06/10/14 0430 06/10/14 1600 06/11/14 0415  HGB 9.3*  --  11.3*  --  10.9*  HCT 29.1*  --  37.0*  --  35.9*  PLT 161  --  195  --  153  APTT 192*  --  >200*  --  130*  LABPROT 33.4*  --  37.1*  --  34.8*  INR 3.25*  --  3.71*  --  3.43*  CREATININE 2.56*  < > 1.71* 1.76* 1.49*  < > = values in this interval not displayed.  Estimated Creatinine Clearance: 63.6 mL/min (by C-G formula based on Cr of 1.49).  Assessment: 63 year old male with chronic heart failure, atrial fibrillation, and apical thrombus on coumadin PTA to start IV heparin when INR <2.   INR today remains elevated at 3.43 likely due to prior coumadin and heart failure.   Goal of Therapy:  INR 2-3 Monitor platelets by anticoagulation protocol: Yes   Plan:  Continue to hold anticoagulation. Follow-up INR and restart when INR <2.   Link Snuffer, PharmD, BCPS Clinical Pharmacist [phone] 06/11/2014,10:45 AM

## 2014-06-11 NOTE — Progress Notes (Signed)
PULMONARY / CRITICAL CARE MEDICINE   Name: [name] MRN: [id number] DOB: [birth year]    ADMISSION DATE:  06/08/2014 CONSULTATION DATE:  06/08/2014  REFERRING MD :  EDP  CHIEF COMPLAINT:  Resp failure and CHF  INITIAL PRESENTATION: 63 y/o M with PMH of DM, AF, Cocaine abuse & end stage CHF, EF of 15% who presented with renal failure, associated hyperkalemia, pulmonary edema, and acute respiratory failure.       SIGNIFICANT EVENTS: 10/30  Admitted with VDRF due to pulmonary edema, renal fx, anasarca 10/31  Weaning on PSV, goal for volume removal with CVVHD 11/01  Neg 1 L in last 24 hours.  Net neg 1L in last 24 hours, tmax 102, cultured overnight. Started on abx 06/10/14: cards consult: Options are very limited as he cannot get advanced therapies and wouldn't be candidate for outpatient HD. Thus IABP is not an option. He has clearly stated he would not want long-term intubation, PEG, trach or frequent shocks.    SUBJECTIVE/OVERNIGHT/INTERVAL HX 06/11/14: 5Lnegative on CVVH since admission. CVP still 20s wih BP soft MAP 72 but sbp 97 and renal planning to continue CRRT.  Still on dobutamine.  SCVO 2 improved to 67 on dobutamine 5mcg.  Blood culture shows gram variable rods   VITAL SIGNS: Temp:  [95.2 F (35.1 C)-99 F (37.2 C)] 98.9 F (37.2 C) (11/02 0700) Pulse Rate:  [77-102] 91 (11/02 0700) Resp:  [8-32] 17 (11/02 0700) BP: (72-110)/(52-74) 97/61 mmHg (11/02 0700) SpO2:  [82 %-100 %] 98 % (11/02 0700) Weight:  [111.2 kg (245 lb 2.4 oz)] 111.2 kg (245 lb 2.4 oz) (11/02 0430)  HEMODYNAMICS: CVP:  [19 mmHg-30 mmHg] 22 mmHg  VENTILATOR SETTINGS:    INTAKE / OUTPUT:  Intake/Output Summary (Last 24 hours) at 06/11/14 [id number] Last data filed at 06/11/14 0800  Gross per 24 hour  Intake  787.8 ml  Output   2842 ml  Net -2054.2 ml    PHYSICAL EXAMINATION: General:  Chronically ill appearing male in NAD Neuro:  Awake and interactive, moves all ext to command. HEENT:   [place]/AT, PERRL, EOM-I and MMM. Cardiovascular:  s1s2 rrr, no M/R/G. Lungs:  resp's even/non-labored on vent, lungs bilaterally distant, faint crackles lower (improved) Abdomen:  Soft, NT, ND and +BS, pitting abd edema  Musculoskeletal:  2+ edema bilaterally. Skin:  Intact.  LABS:  PULMONARY  Recent Labs Lab 06/08/14 1121 06/08/14 1145 06/08/14 1501 06/09/14 1000 06/10/14 0438 06/11/14 0445  PHART  --   --  7.298*  --   --   --   PCO2ART  --   --  32.9*  --   --   --   PO2ART  --   --  197.0*  --   --   --   HCO3  --   --  15.8*  --   --   --   TCO2 16  --  16.8  --   --   --   O2SAT  --  49.1 99.1 66.7 51.8 67.2    CBC  Recent Labs Lab 06/09/14 0358 06/10/14 0430 06/11/14 0415  HGB 9.3* 11.3* 10.9*  HCT 29.1* 37.0* 35.9*  WBC 5.9 11.5* 10.9*  PLT 161 195 153    COAGULATION  Recent Labs Lab 06/08/14 1216 06/08/14 1638 06/09/14 0358 06/10/14 0430 06/11/14 0415  INR 4.75* 2.79* 3.25* 3.71* 3.43*    CARDIAC   Recent Labs Lab 06/08/14 1040  TROPONINI <0.30    Recent Labs Lab 06/08/14 1040  PROBNP 11335.0*     CHEMISTRY  Recent Labs Lab 06/09/14 0358 06/09/14 1436 06/10/14 0305 06/10/14 0430 06/10/14 1436 06/10/14 1600 06/11/14 0204 06/11/14 0415  NA 131* 133*  --  135*  --  134*  --  135*  K 3.5* 4.5  --  5.1  --  4.7  --  4.4  CL 92* 95*  --  98  --  96  --  96  CO2 26 24  --  25  --  25  --  25  GLUCOSE 150* 201*  --  93  --  177*  --  129*  BUN 50* 35*  --  25*  --  25*  --  20  CREATININE 2.56* 2.16*  --  1.71*  --  1.76*  --  1.49*  CALCIUM 8.6 8.6  --  8.7  --  8.4  --  8.6  MG 2.0 2.3 2.5 2.5 2.5  --  2.4 2.4  PHOS 3.0 2.6 3.0 2.8  --  3.3  --  2.2*   Estimated Creatinine Clearance: 63.6 mL/min (by C-G formula based on Cr of 1.49).   LIVER  Recent Labs Lab 06/08/14 1040 06/08/14 1216  06/08/14 1638 06/08/14 2000 06/09/14 0358 06/10/14 0430 06/10/14 1600 06/11/14 0415  AST 85* 98*  --   --   --  492* 417*  --   360*  ALT 66* 76*  --   --   --  282* 363*  --  380*  ALKPHOS 95 94  --   --   --  77 95  --  87  BILITOT 3.8* 3.9*  --   --   --  2.8* 3.2*  --  3.0*  PROT 8.6* 8.4*  --   --   --  7.1 8.8*  --  7.9  ALBUMIN 3.4* 3.3*  < >  --  3.1* 3.0* 3.4* 3.1* 3.0*  INR 4.58* 4.75*  --  2.79*  --  3.25* 3.71*  --  3.43*  < > = values in this interval not displayed.   INFECTIOUS  Recent Labs Lab 06/08/14 1219  LATICACIDVEN 10.7*     ENDOCRINE CBG (last 3)   Recent Labs  06/10/14 2029 06/10/14 2342 06/11/14 0336  GLUCAP 120* 121* 114*         IMAGING x48h Dg Chest Port 1 View  06/10/2014   CLINICAL DATA:  Pulmonary edema.  EXAM: PORTABLE CHEST - 1 VIEW  COMPARISON:  06/08/2014  FINDINGS: Endotracheal tube positioning remains high with the tip just at the thoracic inlet and approximately 10 cm proximal to the carina. Stable positioning of PICC line. Lungs show no edema or consolidation. Overall lung expansion is slightly improved bilaterally. Mild atelectasis present in the left lower lobe. There is stable cardiomegaly. No pleural effusions identified. No pneumothorax.  IMPRESSION: Mild left lower lobe atelectasis. The tip of the endotracheal tube remains high and just at the thoracic inlet.   Electronically Signed   By: Irish Lack M.D.   On: 06/10/2014 10:51       ASSESSMENT / PLAN:  PULMONARY OETT 10/30>>>11/01 A:  Acute Respiratory Failure -  In setting of CHF, acute pulmonary edema, Extubated 06/10/14   - doing well resp wise 06/11/14  P:   Pulmonary toilet Trend CXR prn Wean O2 for sats > 92%  CARDIOVASCULAR CVL R PICC line (old)>>> A:  Acute on Chronic CHF -  Due to NICM, EF 15%, on dobutamine at  home, end stage. Hx Cocaine Abuse - in remission x 3-4 months Bradycardia - wide complex with elevated K AF - on amio & coumadin Hx VT   - 06/11/14: SCVO2 improved to 67 on of dobutamine  P:  Continue Dobutamine at 5. HD for hyperkalemia and volume CHF  Team following, appreciate input Continue heparin gtt, hold home coumadin  RENAL A:   Acute on chronic renal failure - cr at [facility] on 04/16/14 was 1.6.    - renal planning continued CVVH due to soft bP P:   Nephrology following, appreciate input Continue CVVHD for volume removal, goal   GASTROINTESTINAL A:   No active issues P:   SUP:  protonix Start carb modified renal diet  HEMATOLOGIC A:   Coagulopathy - on coumadin for AFIB, supratherapeutic on admission s/p FFP   - still with residual coumadin related high INR 06/11/14  P:  Start  heparin per pharmacy when INR < 2  INFECTIOUS Results for orders placed or performed during the hospital encounter of 06/08/14  MRSA PCR Screening     Status: None   Collection Time: 06/08/14  1:16 PM  Result Value Ref Range Status   MRSA by PCR NEGATIVE NEGATIVE Final    Comment:        The GeneXpert MRSA Assay (FDA approved for NASAL specimens only), is one component of a comprehensive MRSA colonization surveillance program. It is not intended to diagnose MRSA infection nor to guide or monitor treatment for MRSA infections.  Urine culture     Status: None   Collection Time: 06/09/14 10:32 AM  Result Value Ref Range Status   Specimen Description URINE, CATHETERIZED  Final   Special Requests Immunocompromised  Final   Culture  Setup Time   Final    06/09/2014 20:50 Performed at [facility]    Colony Count NO GROWTH Performed at [facility]   Final   Culture NO GROWTH Performed at [facility]   Final   Report Status 06/10/2014 FINAL  Final  Culture, blood (routine x 2)     Status: None (Preliminary result)   Collection Time: 06/10/14  3:39 AM  Result Value Ref Range Status   Specimen Description BLOOD LEFT HAND  Final   Special Requests BOTTLES DRAWN AEROBIC ONLY 4CC  Final   Culture  Setup Time   Final    06/10/2014 17:58 Performed at [facility]    Culture   Final    GRAM VARIABLE  ROD Note: Gram Stain Report Called to,Read Back By and Verified With: BRYCE SHEPARD @ 3:58AM 11.2.15 BY DESIS Performed at [facility]    Report Status PENDING  Incomplete  Culture, blood (routine x 2)     Status: None (Preliminary result)   Collection Time: 06/10/14  3:52 AM  Result Value Ref Range Status   Specimen Description BLOOD LEFT HAND  Final   Special Requests BOTTLES DRAWN AEROBIC ONLY 8CC  Final   Culture  Setup Time   Final    06/10/2014 17:58 Performed at [facility]    Culture   Final    GRAM VARIABLE ROD Note: Gram Stain Report Called to,Read Back By and Verified With: BRYCE SHEPARD @ 3:58AM 11.2.15 BY DESIS Performed at [facility]    Report Status PENDING  Incomplete      A:   Fever - overnight 11/1 and growing variable rods 06/11/14  P:   Monitor fever curve / leukocytosis  Abx: Vanco, start date 11/1, day 1/x (empiric) Abx: Zosyn, start date 11/1, day 1/x (empiric)  ENDOCRINE A:   DM   P:   SSI  NEUROLOGIC A:   ICU associated discomfort    - denies pain 06/11/14 P:   RASS goal: 0   FAMILY  Updates: Patient updated, no family at bedside.  Code Status:  Patient indicated prior to intubation that he would want full code for now but no trach/peg. Will need to readdress code status  TODAY'S SUMMARY: 63 y/o M with hx of cocaine abuse with resultant NICM admitted with acute on chronic CHF / renal failure.  Weaning on PSV, extubate to [place] & monitor closely.  Fever overnight, initiated empiric abx with fever of 102.  Unclear age of PICC line, may need line holiday??    Dr. Kalman Shan, M.D., [facility].C.P Pulmonary and Critical Care Medicine Staff Physician [facility] Pager: [phone], If no answer or between  15:00h - 7:00h: call [phone]  06/11/2014 9:35 AM

## 2014-06-11 NOTE — Progress Notes (Signed)
CRITICAL VALUE ALERT  Critical value received:  Positive blood cultures both bottles for gram variable rods  Date of notification:  06/11/2014  Time of notification:  0402  Critical value read back:Yes.    Nurse who received alert:  Tory Emerald, RN  MD notified (1st page):  Dr Tyson Alias  Time of first page:  0402  MD notified (2nd page):  Time of second page:  Responding MD:  Dr Tyson Alias  Time MD responded:  [phone]

## 2014-06-11 NOTE — Progress Notes (Signed)
Patient ID: [name], male   DOB: January 25, 1951, 63 y.o.   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 Maintained on dobutamine 48mcg/kg/min.   Past medical h/o is notable for CKD with baseline Cr 3.0, cocaine abuse, DM2, AF on amio, VT. He has not been candidate for advanced therapies due to cocaine abuse (quit 4 months ago) and renal failure. Started on dobutamine. Moved to [place] to try to stay off cocaine.   On Oct 14 seen at [facility]. CR was 3.3 (on 04/16/2014 Cr was 1.6 at [facility] on discharge) and potassium was low. Kcl was increased. Over past week increased dyspnea and edema. NYHA IIIB symptoms at baseline.   10/30 Admitted with rep failure with wide complex bradycardia. K 6.8 and Cr 4.6. Intubated and CVVHD started.  Extubated yesterday.  Co-ox was low yesterday and plan was to increase dobutamine to 7.5 but he has remained on 5. Co-ox 67% on dobutamine 5 mcg today.  Net -2.1 liters CVVH.  CVP 22.  + fevers. Gram variable rods on Bcx.  On antibiotics per CCM. Nearly anuric   Objective:   Weight Range:  Vital Signs:   Temp:  [95.2 F (35.1 C)-99 F (37.2 C)] 98.4 F (36.9 C) (11/02 1100) Pulse Rate:  [77-96] 96 (11/02 1100) Resp:  [8-32] 25 (11/02 1100) BP: (72-110)/(52-71) 90/62 mmHg (11/02 1100) SpO2:  [82 %-100 %] 100 % (11/02 1100) Weight:  [245 lb 2.4 oz (111.2 kg)] 245 lb 2.4 oz (111.2 kg) (11/02 0430) Last BM Date:  (UTA)  Weight change: Filed Weights   06/09/14 0402 06/10/14 0428 06/11/14 0430  Weight: 251 lb 8.7 oz (114.1 kg) 250 lb 3.6 oz (113.5 kg) 245 lb 2.4 oz (111.2 kg)    Intake/Output:   Intake/Output Summary (Last 24 hours) at 06/11/14 1405 Last data filed at 06/11/14 1400  Gross per 24 hour  Intake  787.3 ml  Output   3327 ml  Net -2539.7 ml     Physical Exam: CVP 22 General: NAD  Awake. Bair hugger in place HEENT: normal  Neck:  supple. RIJ trialysis catheter.  JVP to ear.  Carotids 2+ bilat; no bruits. No lymphadenopathy or thryomegaly appreciated.  Cor: PMI laterally displaced. Regular rate & rhythm. 2/6 MR +s3  Lungs: + crackles  Abdomen: soft, nontender, + distended. No hepatosplenomegaly. No bruits or masses. Good bowel sounds.  Extremities: no cyanosis, clubbing, rash, 2-3+ edema R and LLE SCDs Neuro: alert & orientedx3, cranial nerves grossly intact. moves all 4 extremities w/o difficulty. Affect pleasant   Telemetry: SR 90s  Labs: Basic Metabolic Panel:  Recent Labs Lab 06/09/14 0358 06/09/14 1436 06/10/14 0305 06/10/14 0430 06/10/14 1436 06/10/14 1600 06/11/14 0204 06/11/14 0415  NA 131* 133*  --  135*  --  134*  --  135*  K 3.5* 4.5  --  5.1  --  4.7  --  4.4  CL 92* 95*  --  98  --  96  --  96  CO2 26 24  --  25  --  25  --  25  GLUCOSE 150* 201*  --  93  --  177*  --  129*  BUN 50* 35*  --  25*  --  25*  --  20  CREATININE 2.56* 2.16*  --  1.71*  --  1.76*  --  1.49*  CALCIUM 8.6 8.6  --  8.7  --  8.4  --  8.6  MG 2.0 2.3 2.5 2.5 2.5  --  2.4 2.4  PHOS 3.0 2.6 3.0 2.8  --  3.3  --  2.2*    Liver Function Tests:  Recent Labs Lab 06/08/14 1040 06/08/14 1216  06/08/14 2000 06/09/14 0358 06/10/14 0430 06/10/14 1600 06/11/14 0415  AST 85* 98*  --   --  492* 417*  --  360*  ALT 66* 76*  --   --  282* 363*  --  380*  ALKPHOS 95 94  --   --  77 95  --  87  BILITOT 3.8* 3.9*  --   --  2.8* 3.2*  --  3.0*  PROT 8.6* 8.4*  --   --  7.1 8.8*  --  7.9  ALBUMIN 3.4* 3.3*  < > 3.1* 3.0* 3.4* 3.1* 3.0*  < > = values in this interval not displayed. No results for input(s): LIPASE, AMYLASE in the last 168 hours. No results for input(s): AMMONIA in the last 168 hours.  CBC:  Recent Labs Lab 06/08/14 1040 06/08/14 1121 06/08/14 1216 06/09/14 0358 06/10/14 0430 06/11/14 0415  WBC 7.2  --  7.3 5.9 11.5* 10.9*  NEUTROABS  --   --  6.2  --   --   --   HGB 11.6* 15.6 12.0* 9.3* 11.3*  10.9*  HCT 36.9* 46.0 38.6* 29.1* 37.0* 35.9*  MCV 76.6*  --  78.8 75.4* 79.7 80.7  PLT 202  --  178 161 195 153    Cardiac Enzymes:  Recent Labs Lab 06/08/14 1040  TROPONINI <0.30    BNP: BNP (last 3 results)  Recent Labs  06/08/14 1040  PROBNP 11335.0*     Other results:    Imaging: Dg Chest Port 1 View  06/10/2014   CLINICAL DATA:  Pulmonary edema.  EXAM: PORTABLE CHEST - 1 VIEW  COMPARISON:  06/08/2014  FINDINGS: Endotracheal tube positioning remains high with the tip just at the thoracic inlet and approximately 10 cm proximal to the carina. Stable positioning of PICC line. Lungs show no edema or consolidation. Overall lung expansion is slightly improved bilaterally. Mild atelectasis present in the left lower lobe. There is stable cardiomegaly. No pleural effusions identified. No pneumothorax.  IMPRESSION: Mild left lower lobe atelectasis. The tip of the endotracheal tube remains high and just at the thoracic inlet.   Electronically Signed   By: Irish LackGlenn  Yamagata M.D.   On: 06/10/2014 10:51     Medications:     Scheduled Medications: . amiodarone  200 mg Oral Daily  . antiseptic oral rinse  7 mL Mouth Rinse BID  . calcium gluconate 1 GM IV  1 g Intravenous Once  . insulin aspart  2-6 Units Subcutaneous 6 times per day  . pantoprazole  40 mg Oral Daily  . piperacillin-tazobactam  3.375 g Intravenous Q6H  . vancomycin  1,250 mg Intravenous Q24H    Infusions: . DOBUTamine 5 mcg/kg/min (06/10/14 0921)  . heparin 10,000 units/ 20 mL infusion syringe 1,350 Units/hr (06/11/14 1400)  . dialysis replacement fluid (prismasate) 800 mL/hr at 06/11/14 1011  . dialysis replacement fluid (prismasate) 700 mL/hr at 06/11/14 1021  . dialysate (PRISMASATE) 2,000 mL/hr at 06/11/14 1300    PRN Medications: fentaNYL, heparin, heparin, sodium chloride   Assessment:   1. Acute respiratory failure  2. A/c systolic HF due to NICM EF < 15%  3. Acute on chronic  renal failure  stage IV  4. Hyperkalemia  5. Cardiogenic shock  6. DM2  7. Cocaine abuse in remission 3-4 months  8. H/o gout  9. Supratherapeutic INR  10. LV thrombus  11. PAF on amio  12. H/o VT 13. Fever 11/1   Plan/Discussion:    Still quite volume overloaded by exam and CVP.  Mildly negative yesterday via CVVH.  Has BP room, would increase fluid removal today.   Co-ox 67%. Remains on dobutamine 5 mcg.  Options are very limited as he cannot get advanced therapies and wouldn't be candidate for outpatient HD. Creatinine trending down. Thus IABP is not an option. He has clearly stated he would not want long-term intubation, PEG, trach or frequent shocks.    Remains on empiric antibiotics by CCM.    Amy Filbert Schilder, NP-C  Length of Stay: 3 Advanced Heart Failure Team Pager [phone] (M-F; 7a - 4p)  Please contact [facility] for night-coverage after hours (4p -7a ) and weekends on [email or web address]  Patient seen and examined with Tonye Becket, NP. We discussed all aspects of the encounter. I agree with the assessment and plan as stated above.   He remains markedly volume overload. CVVHD rate increased. Urine output is poor. Co-ox ok. Will continue dobutamine.   Once volume is removed will need to wait and see if he will be able to maintain his volume status off CVVHD. With severe LV dysfunction will not be candidate for outpatient HD. Prognosis very guarded.   Truman Hayward 3:37 PM

## 2014-06-11 NOTE — Progress Notes (Signed)
[facility] Rounding Note  Subjective: Says he is feeling better Breathing all right Says "I didn't know my kidneys were this bad" - was [place] a [place]ist in [place] whose name he can't remember  Objective: Vital signs in last 24 hours: Temp:  [95.2 F (35.1 C)-99.7 F (37.6 C)] 98.9 F (37.2 C) (11/02 0700) Pulse Rate:  [77-102] 91 (11/02 0700) Resp:  [8-32] 17 (11/02 0700) BP: (72-110)/(52-74) 97/61 mmHg (11/02 0700) SpO2:  [82 %-100 %] 98 % (11/02 0700) FiO2 (%):  [40 %] 40 % (11/01 0900) Weight:  [111.2 kg (245 lb 2.4 oz)] 111.2 kg (245 lb 2.4 oz) (11/02 0430) Weight change: -2.3 kg (-5 lb 1.1 oz)  Intake/Output from previous day: 11/01 0701 - 11/02 0700 In: 861.8 [P.O.:60; I.V.:180.8; NG/GT:25; IV Piggyback:366] Out: 3046 [Urine:55]  Weight Trending 10/30  112.4 kg 10/31  114.1 kg 11/1    113.5 kg 11/2    111.2 kg           Physical Examination: BP 97/61 mmHg  Pulse 91  Temp(Src) 98.9 F (37.2 C) (Core (Comment))  Resp 17  Ht 6' (1.829 m)  Wt 111.2 kg (245 lb 2.4 oz)  BMI 33.24 kg/m2  SpO2 98%  CVP 20 Awake, alert, nasal cannula VS as above Tachy 100 regular S1S2 + s3 2/6 systolic murmur Ant clear/basilar crackles 2+ edema bilat LE's PICC in right arm, trialysis catheter in right groin (10/30) Foley with muddy brown urine  Recent Labs  06/10/14 0430 06/11/14 0415  WBC 11.5* 10.9*  HGB 11.3* 10.9*  HCT 37.0* 35.9*  PLT 195 153    Recent Labs  06/10/14 1600 06/11/14 0415  NA 134* 135*  K 4.7 4.4  CL 96 96  CO2 25 25  GLUCOSE 177* 129*  BUN 25* 20  CREATININE 1.76* 1.49*  CALCIUM 8.4 8.6   Results for [name], [name] (MRN [id number]) as of 06/11/2014 07:18  Ref. Range 06/09/2014 07:30  Iron Latest Range: 42-135 ug/dL 21 (L)  UIBC Latest Range: 125-400 ug/dL 409320  TIBC Latest Range: 215-435 ug/dL 811341  Saturation Ratios Latest Range: 20-55 % 6 (L)    Studies/Results: Dg Chest Port 1 View  06/10/2014   CLINICAL  DATA:  Pulmonary edema.  EXAM: PORTABLE CHEST - 1 VIEW  COMPARISON:  06/08/2014  FINDINGS: Endotracheal tube positioning remains high with the tip just at the thoracic inlet and approximately 10 cm proximal to the carina. Stable positioning of PICC line. Lungs show no edema or consolidation. Overall lung expansion is slightly improved bilaterally. Mild atelectasis present in the left lower lobe. There is stable cardiomegaly. No pleural effusions identified. No pneumothorax.  IMPRESSION: Mild left lower lobe atelectasis. The tip of the endotracheal tube remains high and just at the thoracic inlet.   Electronically Signed   By: Irish LackGlenn  Yamagata M.D.   On: 06/10/2014 10:51   Scheduled Medications . amiodarone  200 mg Oral Daily  . antiseptic oral rinse  7 mL Mouth Rinse BID  . calcium gluconate 1 GM IV  1 g Intravenous Once  . insulin aspart  2-6 Units Subcutaneous 6 times per day  . pantoprazole sodium  40 mg Per Tube Daily  . piperacillin-tazobactam  3.375 g Intravenous Q6H  . vancomycin  1,250 mg Intravenous Q24H   Infusion/other Medications . DOBUTamine 5 mcg/kg/min (06/10/14 0921)  . heparin 10,000 units/ 20 mL infusion syringe 1,350 Units/hr (06/11/14 0700)  . dialysis replacement fluid (prismasate) 800 mL/hr at 06/11/14 0358  .  dialysis replacement fluid (prismasate) 700 mL/hr at 06/11/14 0300  . 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   He was admitted 10/30 with respiratory failure, worsening heart failure, creatinine of 4.6, hyperkalemia (6.8) and wide complex bradycardia. CRRT initiated 10/30 for volume control, renal failure. Has BC+ 11/1 for gram variable rod, on Vanc and zosyn  Assessment/Plan:  AKI on CKD4  Remains  oliguric. Continue CRRT 4K bath, neg 100/hour. Heparin. Unclear how much recovery he will get. CVP still around 20 and volume still up. Continue current rate of fluid removal.  Unclear that he would tolerate IHD.  Would be very poor candidate for outpt HD.    CM with acute on chronic systolic heart failure/NICM  EF < 15% - on dobutamine. Per cardiology.   H/O VT  PAF  Anemia stable. Fe deficient but withhold IV iron in setting of bacteremia  PTH still pending  Gram variable bacteremia (11/1). ID pending. On vanco and zosyn.    Cocaine abuse - none for 4 months     LOS: 3 days   Shaindy Reader B 06/11/2014,7:20 AM

## 2014-06-12 DIAGNOSIS — J9601 Acute respiratory failure with hypoxia: Secondary | ICD-10-CM | POA: Insufficient documentation

## 2014-06-12 DIAGNOSIS — IMO0001 Reserved for inherently not codable concepts without codable children: Secondary | ICD-10-CM | POA: Insufficient documentation

## 2014-06-12 DIAGNOSIS — J9600 Acute respiratory failure, unspecified whether with hypoxia or hypercapnia: Secondary | ICD-10-CM

## 2014-06-12 DIAGNOSIS — Z113 Encounter for screening for infections with a predominantly sexual mode of transmission: Secondary | ICD-10-CM | POA: Insufficient documentation

## 2014-06-12 DIAGNOSIS — T827XXA Infection and inflammatory reaction due to other cardiac and vascular devices, implants and grafts, initial encounter: Secondary | ICD-10-CM | POA: Insufficient documentation

## 2014-06-12 DIAGNOSIS — R509 Fever, unspecified: Secondary | ICD-10-CM

## 2014-06-12 DIAGNOSIS — N179 Acute kidney failure, unspecified: Secondary | ICD-10-CM | POA: Insufficient documentation

## 2014-06-12 DIAGNOSIS — A499 Bacterial infection, unspecified: Secondary | ICD-10-CM

## 2014-06-12 DIAGNOSIS — T80219A Unspecified infection due to central venous catheter, initial encounter: Secondary | ICD-10-CM

## 2014-06-12 DIAGNOSIS — R001 Bradycardia, unspecified: Secondary | ICD-10-CM

## 2014-06-12 DIAGNOSIS — R7881 Bacteremia: Secondary | ICD-10-CM

## 2014-06-12 LAB — PRO B NATRIURETIC PEPTIDE: Pro B Natriuretic peptide (BNP): 6716 pg/mL — ABNORMAL HIGH (ref 0–125)

## 2014-06-12 LAB — POCT ACTIVATED CLOTTING TIME
ACTIVATED CLOTTING TIME: 214 s
ACTIVATED CLOTTING TIME: 185 s
ACTIVATED CLOTTING TIME: 185 s
Activated Clotting Time: 225 seconds
Activated Clotting Time: 202 seconds
Activated Clotting Time: 208 seconds
Activated Clotting Time: 202 seconds
Activated Clotting Time: 202 seconds
Activated Clotting Time: 191 seconds

## 2014-06-12 LAB — APTT: APTT: 72 s — AB (ref 24–37)

## 2014-06-12 LAB — CULTURE, BLOOD (ROUTINE X 2)

## 2014-06-12 LAB — RENAL FUNCTION PANEL
ANION GAP: 10 (ref 5–15)
Albumin: 2.6 g/dL — ABNORMAL LOW (ref 3.5–5.2)
BUN: 10 mg/dL (ref 6–23)
CHLORIDE: 98 meq/L (ref 96–112)
CO2: 26 meq/L (ref 19–32)
Calcium: 8.1 mg/dL — ABNORMAL LOW (ref 8.4–10.5)
Creatinine, Ser: 1.08 mg/dL (ref 0.50–1.35)
GFR, EST AFRICAN AMERICAN: 81 mL/min — AB (ref >90)
GFR, EST NON AFRICAN AMERICAN: 70 mL/min — AB (ref >90)
Glucose, Bld: 121 mg/dL — ABNORMAL HIGH (ref 70–99)
POTASSIUM: 3.7 meq/L (ref 3.7–5.3)
Phosphorus: 1 mg/dL — CL (ref 2.3–4.6)
Sodium: 134 mEq/L — ABNORMAL LOW (ref 137–147)

## 2014-06-12 LAB — GLUCOSE, CAPILLARY
GLUCOSE-CAPILLARY: 153 mg/dL — AB (ref 70–99)
GLUCOSE-CAPILLARY: 162 mg/dL — AB (ref 70–99)
Glucose-Capillary: 102 mg/dL — ABNORMAL HIGH (ref 70–99)
Glucose-Capillary: 203 mg/dL — ABNORMAL HIGH (ref 70–99)
Glucose-Capillary: 111 mg/dL — ABNORMAL HIGH (ref 70–99)
Glucose-Capillary: 150 mg/dL — ABNORMAL HIGH (ref 70–99)
Glucose-Capillary: 146 mg/dL — ABNORMAL HIGH (ref 70–99)

## 2014-06-12 LAB — BASIC METABOLIC PANEL
Anion gap: 14 (ref 5–15)
BUN: 15 mg/dL (ref 6–23)
CHLORIDE: 99 meq/L (ref 96–112)
CO2: 21 mEq/L (ref 19–32)
Calcium: 8.3 mg/dL — ABNORMAL LOW (ref 8.4–10.5)
Creatinine, Ser: 1.47 mg/dL — ABNORMAL HIGH (ref 0.50–1.35)
GFR calc Af Amer: 56 mL/min — ABNORMAL LOW (ref >90)
GFR, EST NON AFRICAN AMERICAN: 48 mL/min — AB (ref >90)
Glucose, Bld: 143 mg/dL — ABNORMAL HIGH (ref 70–99)
POTASSIUM: 4.4 meq/L (ref 3.7–5.3)
Sodium: 134 mEq/L — ABNORMAL LOW (ref 137–147)

## 2014-06-12 LAB — CBC WITH DIFFERENTIAL/PLATELET
BASOS ABS: 0 10*3/uL (ref 0.0–0.1)
Basophils Relative: 0 % (ref 0–1)
Eosinophils Absolute: 0.1 10*3/uL (ref 0.0–0.7)
Eosinophils Relative: 1 % (ref 0–5)
HCT: 35 % — ABNORMAL LOW (ref 39.0–52.0)
Hemoglobin: 11 g/dL — ABNORMAL LOW (ref 13.0–17.0)
LYMPHS PCT: 13 % (ref 12–46)
Lymphs Abs: 1 10*3/uL (ref 0.7–4.0)
MCH: 24.6 pg — ABNORMAL LOW (ref 26.0–34.0)
MCHC: 31.4 g/dL (ref 30.0–36.0)
MCV: 78.1 fL (ref 78.0–100.0)
MONOS PCT: 13 % — AB (ref 3–12)
Monocytes Absolute: 1 10*3/uL (ref 0.1–1.0)
Neutro Abs: 5.3 10*3/uL (ref 1.7–7.7)
Neutrophils Relative %: 73 % (ref 43–77)
PLATELETS: 134 10*3/uL — AB (ref 150–400)
RBC: 4.48 MIL/uL (ref 4.22–5.81)
RDW: 21.2 % — AB (ref 11.5–15.5)
WBC: 7.4 10*3/uL (ref 4.0–10.5)

## 2014-06-12 LAB — PROTIME-INR
INR: 2.52 — AB (ref 0.00–1.49)
INR: 2.2 — ABNORMAL HIGH (ref 0.00–1.49)
PROTHROMBIN TIME: 24.6 s — AB (ref 11.6–15.2)
Prothrombin Time: 27.4 seconds — ABNORMAL HIGH (ref 11.6–15.2)

## 2014-06-12 LAB — LACTIC ACID, PLASMA: Lactic Acid, Venous: 1.7 mmol/L (ref 0.5–2.2)

## 2014-06-12 LAB — MAGNESIUM: MAGNESIUM: 2.6 mg/dL — AB (ref 1.5–2.5)

## 2014-06-12 LAB — PHOSPHORUS: PHOSPHORUS: 1.5 mg/dL — AB (ref 2.3–4.6)

## 2014-06-12 LAB — ALBUMIN: Albumin: 2.7 g/dL — ABNORMAL LOW (ref 3.5–5.2)

## 2014-06-12 MED ORDER — HEPARIN SODIUM (PORCINE) 5000 UNIT/ML IJ SOLN: 5000.0000 [IU] | Freq: Three times a day (TID) | INTRAMUSCULAR | Status: DC

## 2014-06-12 MED ORDER — CALCITRIOL 0.25 MCG PO CAPS
0.2500 ug | ORAL_CAPSULE | Freq: Every day | ORAL | Status: DC
  Administered 2014-06-12 – 2014-06-20 (×9): 0.25 ug via ORAL
  Administered 2014-06-21: 0.5 ug via ORAL
  Administered 2014-06-22 – 2014-06-23 (×2): 0.25 ug via ORAL
  Filled 2014-06-12 (×13): qty 1

## 2014-06-12 MED ORDER — SODIUM PHOSPHATE 3 MMOLE/ML IV SOLN
10.0000 mmol | Freq: Once | INTRAVENOUS | Status: AC
  Administered 2014-06-12: 10 mmol via INTRAVENOUS
  Filled 2014-06-12: qty 3.33

## 2014-06-12 MED ORDER — SODIUM PHOSPHATE 3 MMOLE/ML IV SOLN
30.0000 mmol | Freq: Once | INTRAVENOUS | Status: AC
  Administered 2014-06-12: 30 mmol via INTRAVENOUS
  Filled 2014-06-12: qty 10

## 2014-06-12 MED ORDER — K PHOS MONO-SOD PHOS DI & MONO 155-852-130 MG PO TABS
500.0000 mg | ORAL_TABLET | Freq: Three times a day (TID) | ORAL | Status: AC
  Administered 2014-06-12 – 2014-06-13 (×3): 500 mg via ORAL
  Filled 2014-06-12 (×3): qty 2

## 2014-06-12 NOTE — Care Management Note (Signed)
    Page 1 of 2   06/22/2014     9:56:32 AM CARE MANAGEMENT NOTE 06/22/2014  Patient:  [name], [name]   Account Number:  [id number]  Date Initiated:  06/12/2014  Documentation initiated by:  AMERSON,JULIE  Subjective/Objective Assessment:   Pt adm on 06/08/14 with respiratory failure, wide complex tachycardia.     Action/Plan:   Pt extubated on 11/1.  Will need PT/OT consults when able to tolerate to determine home needs.   Anticipated DC Date:  06/17/2014   Anticipated DC Plan:        DC Planning Services  CM consult      Choice offered to / List presented to:             Status of service:  In process, will continue to follow Medicare Important Message given?  YES (If response is "NO", the following Medicare IM given date fields will be blank) Date Medicare IM given:  06/18/2014 Medicare IM given by:  [facility] Date Additional Medicare IM given:  06/22/2014 Additional Medicare IM given by:  [facility] Henretta Quist  Discharge Disposition:    Per UR Regulation:  Reviewed for med. necessity/level of care/duration of stay  If discussed at Long Length of Stay Meetings, dates discussed:   06/21/2014    Comments:  ContactsToni Amend [phone]      [place],Tameca Niece [phone]     Jerffries,[name] Daughter [phone]  06-22-14 10am Avie Arenas, RNBSN - [phone] [facility] states has 6000 dollar deductible that right now only about 3000 has been paid of this.  Once this 6000 dollars has been paid out of pocket his services at [facility] would be covered with prior insurance approval.  Off CRRT but creat increasing - placing HD cath today.  off dobutamine now - not sure if will tolerate being off. Continuing to follow.  [facility] has been mixing dobutamine and providing at home - contact - Deven Audi at [phone].  HH was provided  by [facility] - RN once a week. Has been at [facility] in [place] prior but was only on dobutamine at that time.  CM will continue to  follow.     06-20-14 9:45am Avie Arenas, RNBSN [phone] [facility] says no.  No SNF will take on either HD or PD due to OP dialysis center not able to take on dobutamine. Waiting to her from [facility].  Talked with patient about options.  There is no one to assist with him at home except daughter who is blind.  Talked about peritoneal dialysis at home and him being able to do primarily on own with support from [facility].  States not sure yet if could do, he needs to see what that entails first.  06-18-14  8:30am  Avie Arenas, RNBSN [phone] Continues on CRRT and dobutamine.  Will check [facility] benefits.

## 2014-06-12 NOTE — Progress Notes (Signed)
[facility] Rounding Note  Subjective: Denies any complaints this morning; wants to know "whats the next step for his kidneys" - was seeing a nephrologist in [place] whose name he can't remember  Objective: Vital signs in last 24 hours: Temp:  [97.3 F (36.3 C)-98.9 F (37.2 C)] 98.7 F (37.1 C) (11/03 0715) Pulse Rate:  [53-96] 90 (11/03 0715) Resp:  [9-28] 14 (11/03 0715) BP: (75-105)/(48-88) 87/64 mmHg (11/03 0715) SpO2:  [82 %-100 %] 96 % (11/03 0715) Weight:  [239 lb 3.2 oz (108.5 kg)] 239 lb 3.2 oz (108.5 kg) (11/03 0500) Weight change: -5 lb 15.2 oz (-2.7 kg)  Intake/Output from previous day: 11/02 0701 - 11/03 0700 In: 878.7 [P.O.:30; I.V.:158.7; IV Piggyback:450] Out: 3484 [Urine:120]  Down -2.6L in last 24 hrs; Net - 4.5L since admit  Weight Trending 10/30  112.4 kg 10/31  114.1 kg 11/1    113.5 kg 11/2    111.2 kg     11/3     108.5 kg       Physical Examination: BP 87/64 mmHg  Pulse 90  Temp(Src) 98.7 F (37.1 C) (Core (Comment))  Resp 14  Ht 6' (1.829 m)  Wt 239 lb 3.2 oz (108.5 kg)  BMI 32.43 kg/m2  SpO2 96%  CVP 20 Awake; A&Ox3  VS as above RRR; S1S2 + s3 w/ 2/6 systolic murmur Lungs CTAB  2+ edema bilat notable at hips and posterior thigh PICC in right arm, trialysis catheter in right groin (10/30) Foley with minimal muddy brown urine  Recent Labs  06/11/14 0415 06/12/14 0450  WBC 10.9* 7.4  HGB 10.9* 11.0*  HCT 35.9* 35.0*  PLT 153 134*    Recent Labs  06/11/14 1629 06/12/14 0450  NA 134* 134*  K 4.3 4.4  CL 98 99  CO2 26 21  GLUCOSE 144* 143*  BUN 16 15  CREATININE 1.30 1.47*  CALCIUM 8.3* 8.3*   Results for Petersen, [name] (MRN [id number]) as of 06/11/2014 07:18  Ref. Range 06/09/2014 07:30  Iron Latest Range: 42-135 ug/dL 21 (L)  UIBC Latest Range: 125-400 ug/dL 409320  TIBC Latest Range: 215-435 ug/dL 811341  Saturation Ratios Latest Range: 20-55 % 6 (L)    Studies/Results: No results found. Scheduled  Medications . amiodarone  200 mg Oral Daily  . antiseptic oral rinse  7 mL Mouth Rinse BID  . calcium gluconate 1 GM IV  1 g Intravenous Once  . insulin aspart  2-6 Units Subcutaneous 6 times per day  . pantoprazole  40 mg Oral Daily  . piperacillin-tazobactam  3.375 g Intravenous Q6H  . vancomycin  1,250 mg Intravenous Q24H   Infusion/other Medications . DOBUTamine 5 mcg/kg/min (06/12/14 0516)  . heparin 10,000 units/ 20 mL infusion syringe 1,250 Units/hr (06/12/14 0220)  . dialysis replacement fluid (prismasate) 800 mL/hr at 06/12/14 0547  . dialysis replacement fluid (prismasate) 700 mL/hr at 06/11/14 1800  . 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   He was admitted 10/30 with respiratory failure, worsening heart failure, creatinine of 4.6, hyperkalemia (6.8) and wide complex bradycardia. CRRT initiated 10/30 for volume control, renal failure. Has BC+ 11/1 for gram variable rod, on Vanc and zosyn  Assessment/Plan:  AKI on CKD4  Remains oliguric. Continue CRRT 4K bath; inc rate today to neg 100 to 150 hour  to facilitate fluid removal. Heparin. Unclear how much recovery he will get. CVP still around 20 and volume still up. Unclear that he would tolerate IHD, but will continue CRRT until dry weight is obtained and attempt IHD to see if he will recover any renal fcn. Discussed case w/ Dr Gala Romney: Pt is not candidate for heart transplant or pump therapy. Would be very poor candidate for outpt HD given need for dobutamine for past several months. If unable to recover renal fcn will need to discuss palliative options.     CM with acute on chronic systolic heart failure/NICM  EF < 15% - on dobutamine. Per cardiology.    Hypophosphatemia: 1.5 on 11/3. Will give Sodium phosphate x 1 today  H/O VT  PAF: INR 2.52 (11/3) Pharm to start IV heparin when INR <2  Anemia stable. Fe deficient but withhold IV iron in setting of bacteremia  PTH: Elevated at 168: Start Calcitriol (0.25 mcg) PO qd  Gram variable rods bacteremia (11/1). On vanco and zosyn (11/1>>)  Cocaine abuse - none for 4 months     LOS: 4 days   Wenda Low 06/12/2014,7:41 AM  I have seen and examined this patient and agree with plan and assessment of Dr.Joyner. Pt is essentially anuric, tolerating CRRT, and we are increasing fluid removal today to try and get him as dry as possible before transitioning to IHD (which he may or may not tolerate).  The real issue here is long term - I am not all that optimistic that he will recover kidney function back to his most recent creatinine of 3 (which is a very marked reduction of GFR at that level), he may or may not tolerate IHD in the short run, but he would not be able to dialyze in an outpt HD unit on IV inotropes, so if there is no evidence of renal function improvement over the coming days then a palliative approach may be necessary. I have talked with Dr. Teressa Lower (who has spoken with the CHF/Heart transplant group at [facility], and he is not considered a candidate for transplant even if he remains drug free for a year).   Gudrun Axe B,MD 06/12/2014 12:03 PM

## 2014-06-12 NOTE — Clinical Documentation Improvement (Signed)
Supporting Information: Gram variable rods bacteremia (11/1). On vanco and zosyn (11/1>>) per 11/03 progress notes.  Labs: 10/30: lactic acid: 10.7 10/30: abnormal urine micro & urinalysis.   Possible diagnosis? . Bacteremia (positive blood cultures only) . Urosepsis-MUST specify sepsis with UTI, versus UTI only . Sepsis-specify causative organism if known . Sepsis due to: --Device --Implant --Graft --Infusion . Severe sepsis-sepsis with organ dysfunction --Specify organ dysfunction Respiratory failure Encephalopathy Acute kidney failure Other (specify) . SIRS (Systemic Inflammatory Response Syndrome --With or without organ dysfunction . Document septic shock if present . Document any associated diagnoses/conditions   Thank Gabriel Cirri Documentation Specialist [phone] [email or web address] [email or web address]

## 2014-06-12 NOTE — Progress Notes (Signed)
Patient ID: [name], male   DOB: [birth year], 63 y.o.   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 Maintained on dobutamine 103mcg/kg/min.   Past medical h/o is notable for CKD with baseline Cr 3.0, cocaine abuse, DM2, AF on amio, VT. He has not been candidate for advanced therapies due to cocaine abuse (quit 4 months ago) and renal failure. Started on dobutamine. Moved to [place] to try to stay off cocaine.   On Oct 14 seen at [facility]. CR was 3.3 (on 04/16/2014 Cr was 1.6 at [facility] on discharge) and potassium was low. Kcl was increased. Over past week increased dyspnea and edema. NYHA IIIB symptoms at baseline.   10/30 Admitted with rep failure with wide complex bradycardia. K 6.8 and Cr 4.6. Intubated and CVVHD started.  Extubated 11/1.  Continues on CVVHD. Weight down 6 pounds.  Breathing better. CVP now 18. Remains essentially anuric. On dobutamine 5.    Objective:   Weight Range:  Vital Signs:   Temp:  [97.3 F (36.3 C)-98.9 F (37.2 C)] 98.9 F (37.2 C) (11/03 0800) Pulse Rate:  [53-96] 95 (11/03 0800) Resp:  [9-28] 18 (11/03 0800) BP: (75-105)/(48-88) 95/64 mmHg (11/03 0800) SpO2:  [82 %-100 %] 100 % (11/03 0800) Weight:  [108.5 kg (239 lb 3.2 oz)] 108.5 kg (239 lb 3.2 oz) (11/03 0500) Last BM Date: 06/11/14  Weight change: Filed Weights   06/10/14 0428 06/11/14 0430 06/12/14 0500  Weight: 113.5 kg (250 lb 3.6 oz) 111.2 kg (245 lb 2.4 oz) 108.5 kg (239 lb 3.2 oz)    Intake/Output:   Intake/Output Summary (Last 24 hours) at 06/12/14 0955 Last data filed at 06/12/14 0900  Gross per 24 hour  Intake  811.8 ml  Output   3454 ml  Net -2642.2 ml     Physical Exam: CVP 22 General: NAD  Awake. Bair hugger in place HEENT: normal  Neck: supple. RIJ catheter.  JVP to ear.  Carotids 2+ bilat; no bruits. No lymphadenopathy or thryomegaly appreciated.  Cor:  PMI laterally displaced. Regular rate & rhythm. 2/6 MR +s3  Lungs: + crackles  Abdomen: soft, nontender, + distended. No hepatosplenomegaly. No bruits or masses. Good bowel sounds.  Extremities: no cyanosis, clubbing, rash, 2-3+ edema R and LLE to thighs  SCDs Neuro: alert & orientedx3, cranial nerves grossly intact. moves all 4 extremities w/o difficulty. Affect pleasant   Telemetry: SR 90s  Labs: Basic Metabolic Panel:  Recent Labs Lab 06/10/14 0430 06/10/14 1436 06/10/14 1600 06/11/14 0204 06/11/14 0415 06/11/14 1629 06/12/14 0450  NA 135*  --  134*  --  135* 134* 134*  K 5.1  --  4.7  --  4.4 4.3 4.4  CL 98  --  96  --  96 98 99  CO2 25  --  25  --  25 26 21   GLUCOSE 93  --  177*  --  129* 144* 143*  BUN 25*  --  25*  --  20 16 15   CREATININE 1.71*  --  1.76*  --  1.49* 1.30 1.47*  CALCIUM 8.7  --  8.4  --  8.6 8.3* 8.3*  MG 2.5 2.5  --  2.4 2.4  --  2.6*  PHOS 2.8  --  3.3  --  2.2* 1.3* 1.5*    Liver Function Tests:  Recent Labs Lab 06/08/14 1040  06/08/14 1216  06/09/14 0358 06/10/14 0430 06/10/14 1600 06/11/14 0415 06/11/14 1629 06/12/14 0450  AST 85* 98*  --  492* 417*  --  360*  --   --   ALT 66* 76*  --  282* 363*  --  380*  --   --   ALKPHOS 95 94  --  77 95  --  87  --   --   BILITOT 3.8* 3.9*  --  2.8* 3.2*  --  3.0*  --   --   PROT 8.6* 8.4*  --  7.1 8.8*  --  7.9  --   --   ALBUMIN 3.4* 3.3*  < > 3.0* 3.4* 3.1* 3.0* 2.8* 2.7*  < > = values in this interval not displayed. No results for input(s): LIPASE, AMYLASE in the last 168 hours. No results for input(s): AMMONIA in the last 168 hours.  CBC:  Recent Labs Lab 06/08/14 1216 06/09/14 0358 06/10/14 0430 06/11/14 0415 06/12/14 0450  WBC 7.3 5.9 11.5* 10.9* 7.4  NEUTROABS 6.2  --   --   --  5.3  HGB 12.0* 9.3* 11.3* 10.9* 11.0*  HCT 38.6* 29.1* 37.0* 35.9* 35.0*  MCV 78.8 75.4* 79.7 80.7 78.1  PLT 178 161 195 153 134*    Cardiac Enzymes:  Recent Labs Lab 06/08/14 1040  TROPONINI  <0.30    BNP: BNP (last 3 results)  Recent Labs  06/08/14 1040 06/12/14 0450  PROBNP 11335.0* 6716.0*     Other results:    Imaging: No results found.   Medications:     Scheduled Medications: . amiodarone  200 mg Oral Daily  . antiseptic oral rinse  7 mL Mouth Rinse BID  . calcitRIOL  0.25 mcg Oral Daily  . calcium gluconate 1 GM IV  1 g Intravenous Once  . insulin aspart  2-6 Units Subcutaneous 6 times per day  . pantoprazole  40 mg Oral Daily  . piperacillin-tazobactam  3.375 g Intravenous Q6H  . sodium phosphate  Dextrose 5% IVPB  10 mmol Intravenous Once  . vancomycin  1,250 mg Intravenous Q24H    Infusions: . DOBUTamine 5 mcg/kg/min (06/12/14 0516)  . heparin 10,000 units/ 20 mL infusion syringe 1,350 Units/hr (06/12/14 0800)  . dialysis replacement fluid (prismasate) 800 mL/hr at 06/12/14 0547  . dialysis replacement fluid (prismasate) 700 mL/hr at 06/12/14 0840  . dialysate (PRISMASATE) 2,000 mL/hr at 06/12/14 0953    PRN Medications: fentaNYL, heparin, heparin, sodium chloride   Assessment:   1. Acute respiratory failure  2. A/c systolic HF due to NICM EF < 15%  3. Acute on chronic renal failure stage IV  4. Hyperkalemia  5. Cardiogenic shock  6. DM2  7. Cocaine abuse in remission 3-4 months  8. H/o gout  9. Supratherapeutic INR  10. LV thrombus  11. PAF on amio  12. H/o VT 13. Fever 11/1   Plan/Discussion:    He remains markedly volume overload. CVVHD rate increased. Essentially anuric. Will continue dobutamine.   Would continue CVVHD until volume is removed and then we will have to support until we see if his ATN gets better. If kidneys do not recover we are then in a palliative situation as he is not candidate for outpatient  HD on dobutamine. And renal failure precludes VAD or transplant.  I have d/w [facility] team as well as Drs. Eliott Nine and Coy.  I am happy to take him on the HF service.   The patient is critically  ill with  multiple organ systems failure and requires high complexity decision making for assessment and support, frequent evaluation and titration of therapies, application of advanced monitoring technologies and extensive interpretation of multiple databases.   Critical Care Time devoted to patient care services described in this note is 35 Minutes.   Daniel Bensimhon,MD 9:55 AM

## 2014-06-12 NOTE — Consult Note (Signed)
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 Haroldine Laws   HPI: [name] is an 63 y.o. male with end stage HF due to NICM , EF <14%, followed at [facility]. Maintained on dobutamine 74mg/kg/min, also with CKD, Cr 3, cocaine abuse, DM, AFibrillation on amiodarone admitted to ICU with respiratory failure, heart failure, kidney failure. supra therapeutic INR. He had blood cultures taken on 06/10/14 which have grown a bacillus species in 2 out of 2 cultures. In talking to me he denies any history of IV drug use remote or currently other he has had recent cocaine use and apparently [facility]. He does state that he lives alone and that oftentimes he has to change the connections to the IV dobutamine and his central line. At times he has difficulty as it has not used sterile technique and even has been nippling some of the lines with his teeth to get certain parts open.   Past Medical History  Diagnosis Date  . Diabetes mellitus without complication   . Chronic systolic heart failure     a) ECHO ([facility]2015): EF <15%, multiple apical thrombi, RV mod enlarged, mod MR b[facility], RV 64/9 (15), PA 64/32 (42), PCWP: 24, CI: 1.4  . HTN (hypertension)   . HLD (hyperlipidemia)   . CKD (chronic kidney disease), stage III   . Apical mural thrombus   . Mitral regurgitation   . Drug use     cocaine  . Atrial fibrillation, chronic     History reviewed. No pertinent past surgical history.ergies:   No Known Allergies   Medications: I have reviewed patients current medications as documented in Epic Anti-infectives    Start     Dose/Rate Route Frequency Ordered Stop   06/10/14 1000  piperacillin-tazobactam (ZOSYN) IVPB 3.375 g  Status:   Discontinued     3.375 g100 mL/hr over 30 Minutes Intravenous Every 6 hours 06/10/14 0918 06/12/14 1318   06/10/14 1000  vancomycin (VANCOCIN) 1,250 mg in sodium chloride 0.9 % 250 mL IVPB     1,250 mg166.7 mL/hr over 90 Minutes Intravenous Every 24 hours 06/10/14 06834       Social History:  reports that he has never smoked. He has never used smokeless tobacco. He reports that he uses illicit drugs. His alcohol history is not on file.  History reviewed. No pertinent family history.  As in HPI and primary teams notes otherwise 12 point review of systems is negative  Blood pressure 95/58, pulse 95, temperature 100.2 F (37.9 C), temperature source Core (Comment), resp. rate 17, height 6' (1.829 m), weight 239 lb 3.2 oz (108.5 kg), SpO2 97 %. General: Alert and awake, oriented x3, under a cooling blanket HEENT: anicteric sclera, pupils reactive to light and accommodation, EOMI, oropharynx clear and without exudate CVS tachycardic raterate,  2/6 systolic murmur at PMI Chest: clear to auscultation bilaterally anteriorly Abdomen: soft nontender,distended, normal bowel sounds, Extremities: 2+ edema noted bilaterally Neuro: nonfocal, strength and sensation intact Skin: PICC line intact dialysis line in place   Results for orders placed or performed during the hospital encounter of 06/08/14 (from the past 48 hour(s))  Glucose, capillary     Status: Abnormal   Collection Time: 06/10/14  3:23 PM  Result Value Ref Range   Glucose-Capillary 155 (H) 70 - 99 mg/dL  POCT Activated clotting time     Status: None   Collection Time: 06/10/14  3:51 PM  Result Value Ref Range   Activated Clotting Time 242 seconds  Renal function panel (daily at 1600)     Status: Abnormal   Collection Time: 06/10/14  4:00 PM  Result Value Ref Range   Sodium 134 (L) 137 - 147 mEq/L   Potassium 4.7 3.7 - 5.3 mEq/L   Chloride 96 96 - 112 mEq/L   CO2 25 19 - 32 mEq/L   Glucose, Bld 177 (H) 70 - 99 mg/dL   BUN 25 (H)  6 - 23 mg/dL   Creatinine, Ser 1.76 (H) 0.50 - 1.35 mg/dL   Calcium 8.4 8.4 - 10.5 mg/dL   Phosphorus 3.3 2.3 - 4.6 mg/dL   Albumin 3.1 (L) 3.5 - 5.2 g/dL   GFR calc non Af Amer 39 (L) >90 mL/min   GFR calc Af Amer 45 (L) >90 mL/min    Comment: (NOTE) The eGFR has been calculated using the CKD EPI equation. This calculation has not been validated in all clinical situations. eGFR's persistently <90 mL/min signify possible Chronic Kidney Disease.    Anion gap 13 5 - 15  POCT Activated clotting time     Status: None   Collection Time: 06/10/14  5:44 PM  Result Value Ref Range   Activated Clotting Time 248 seconds  POCT Activated clotting time     Status: None   Collection Time: 06/10/14  7:22 PM  Result Value Ref Range   Activated Clotting Time 225 seconds  POCT Activated clotting time     Status: None   Collection Time: 06/10/14  8:12 PM  Result Value Ref Range   Activated Clotting Time 225 seconds  Glucose, capillary     Status: Abnormal   Collection Time: 06/10/14  8:29 PM  Result Value Ref Range   Glucose-Capillary 120 (H) 70 - 99 mg/dL   Comment 1 Notify RN   POCT Activated clotting time     Status: None   Collection Time: 06/10/14 11:22 PM  Result Value Ref Range   Activated Clotting Time 208 seconds  Glucose, capillary     Status: Abnormal   Collection Time: 06/10/14 11:42 PM  Result Value Ref Range   Glucose-Capillary 121 (H) 70 - 99 mg/dL  POCT Activated clotting time     Status: None   Collection Time: 06/11/14 12:12 AM  Result Value Ref Range   Activated Clotting Time 225 seconds  POCT Activated clotting time     Status: None   Collection Time: 06/11/14  1:23 AM  Result Value Ref Range   Activated Clotting Time 208 seconds  POCT Activated clotting time     Status: None   Collection Time: 06/11/14  2:03 AM  Result Value Ref Range   Activated Clotting Time 219 seconds  Magnesium     Status: None   Collection Time: 06/11/14  2:04 AM  Result Value Ref Range    Magnesium 2.4 1.5 - 2.5 mg/dL  Glucose, capillary     Status: Abnormal   Collection Time: 06/11/14  3:36 AM  Result Value Ref Range   Glucose-Capillary 114 (H) 70 - 99 mg/dL   Comment 1 Notify RN   Magnesium     Status: None   Collection Time: 06/11/14  4:15 AM  Result Value Ref Range   Magnesium  2.4 1.5 - 2.5 mg/dL  APTT     Status: Abnormal   Collection Time: 06/11/14  4:15 AM  Result Value Ref Range   aPTT 130 (H) 24 - 37 seconds    Comment:        IF BASELINE aPTT IS ELEVATED, SUGGEST PATIENT RISK ASSESSMENT BE USED TO DETERMINE APPROPRIATE ANTICOAGULANT THERAPY.   Protime-INR     Status: Abnormal   Collection Time: 06/11/14  4:15 AM  Result Value Ref Range   Prothrombin Time 34.8 (H) 11.6 - 15.2 seconds   INR 3.43 (H) 0.00 - 1.49  CBC     Status: Abnormal   Collection Time: 06/11/14  4:15 AM  Result Value Ref Range   WBC 10.9 (H) 4.0 - 10.5 K/uL   RBC 4.45 4.22 - 5.81 MIL/uL   Hemoglobin 10.9 (L) 13.0 - 17.0 g/dL   HCT 35.9 (L) 39.0 - 52.0 %   MCV 80.7 78.0 - 100.0 fL   MCH 24.5 (L) 26.0 - 34.0 pg   MCHC 30.4 30.0 - 36.0 g/dL   RDW 21.3 (H) 11.5 - 15.5 %   Platelets 153 150 - 400 K/uL  Phosphorus     Status: Abnormal   Collection Time: 06/11/14  4:15 AM  Result Value Ref Range   Phosphorus 2.2 (L) 2.3 - 4.6 mg/dL  Comprehensive metabolic panel     Status: Abnormal   Collection Time: 06/11/14  4:15 AM  Result Value Ref Range   Sodium 135 (L) 137 - 147 mEq/L   Potassium 4.4 3.7 - 5.3 mEq/L   Chloride 96 96 - 112 mEq/L   CO2 25 19 - 32 mEq/L   Glucose, Bld 129 (H) 70 - 99 mg/dL   BUN 20 6 - 23 mg/dL   Creatinine, Ser 1.49 (H) 0.50 - 1.35 mg/dL   Calcium 8.6 8.4 - 10.5 mg/dL   Total Protein 7.9 6.0 - 8.3 g/dL   Albumin 3.0 (L) 3.5 - 5.2 g/dL   AST 360 (H) 0 - 37 U/L   ALT 380 (H) 0 - 53 U/L   Alkaline Phosphatase 87 39 - 117 U/L   Total Bilirubin 3.0 (H) 0.3 - 1.2 mg/dL   GFR calc non Af Amer 48 (L) >90 mL/min   GFR calc Af Amer 55 (L) >90 mL/min     Comment: (NOTE) The eGFR has been calculated using the CKD EPI equation. This calculation has not been validated in all clinical situations. eGFR's persistently <90 mL/min signify possible Chronic Kidney Disease.    Anion gap 14 5 - 15  POCT Activated clotting time     Status: None   Collection Time: 06/11/14  4:24 AM  Result Value Ref Range   Activated Clotting Time 202 seconds  Carboxyhemoglobin     Status: Abnormal   Collection Time: 06/11/14  4:45 AM  Result Value Ref Range   Total hemoglobin 11.1 (L) 13.5 - 18.0 g/dL   O2 Saturation 67.2 %   Carboxyhemoglobin 1.4 0.5 - 1.5 %   Methemoglobin 1.0 0.0 - 1.5 %  Glucose, capillary     Status: Abnormal   Collection Time: 06/11/14  7:51 AM  Result Value Ref Range   Glucose-Capillary 112 (H) 70 - 99 mg/dL  POCT Activated clotting time     Status: None   Collection Time: 06/11/14  7:54 AM  Result Value Ref Range   Activated Clotting Time 191 seconds  POCT Activated clotting time     Status: None  Collection Time: 06/11/14  8:55 AM  Result Value Ref Range   Activated Clotting Time 202 seconds  Glucose, capillary     Status: Abnormal   Collection Time: 06/11/14 12:33 PM  Result Value Ref Range   Glucose-Capillary 156 (H) 70 - 99 mg/dL  POCT Activated clotting time     Status: None   Collection Time: 06/11/14  1:59 PM  Result Value Ref Range   Activated Clotting Time 202 seconds  Glucose, capillary     Status: Abnormal   Collection Time: 06/11/14  3:28 PM  Result Value Ref Range   Glucose-Capillary 129 (H) 70 - 99 mg/dL  Renal function panel (daily at 1600)     Status: Abnormal   Collection Time: 06/11/14  4:29 PM  Result Value Ref Range   Sodium 134 (L) 137 - 147 mEq/L   Potassium 4.3 3.7 - 5.3 mEq/L   Chloride 98 96 - 112 mEq/L   CO2 26 19 - 32 mEq/L   Glucose, Bld 144 (H) 70 - 99 mg/dL   BUN 16 6 - 23 mg/dL   Creatinine, Ser 1.30 0.50 - 1.35 mg/dL   Calcium 8.3 (L) 8.4 - 10.5 mg/dL   Phosphorus 1.3 (L) 2.3 - 4.6  mg/dL   Albumin 2.8 (L) 3.5 - 5.2 g/dL   GFR calc non Af Amer 56 (L) >90 mL/min   GFR calc Af Amer 65 (L) >90 mL/min    Comment: (NOTE) The eGFR has been calculated using the CKD EPI equation. This calculation has not been validated in all clinical situations. eGFR's persistently <90 mL/min signify possible Chronic Kidney Disease.    Anion gap 10 5 - 15  POCT Activated clotting time     Status: None   Collection Time: 06/11/14  6:31 PM  Result Value Ref Range   Activated Clotting Time 202 seconds  Glucose, capillary     Status: Abnormal   Collection Time: 06/11/14  7:38 PM  Result Value Ref Range   Glucose-Capillary 141 (H) 70 - 99 mg/dL  POCT Activated clotting time     Status: None   Collection Time: 06/11/14 10:11 PM  Result Value Ref Range   Activated Clotting Time 225 seconds  POCT Activated clotting time     Status: None   Collection Time: 06/11/14 11:22 PM  Result Value Ref Range   Activated Clotting Time 208 seconds  Lactic acid, plasma     Status: None   Collection Time: 06/11/14 11:33 PM  Result Value Ref Range   Lactic Acid, Venous 1.7 0.5 - 2.2 mmol/L  Glucose, capillary     Status: Abnormal   Collection Time: 06/11/14 11:36 PM  Result Value Ref Range   Glucose-Capillary 162 (H) 70 - 99 mg/dL   Comment 1 Documented in Chart    Comment 2 Notify RN   POCT Activated clotting time     Status: None   Collection Time: 06/12/14  1:11 AM  Result Value Ref Range   Activated Clotting Time 225 seconds  POCT Activated clotting time     Status: None   Collection Time: 06/12/14  2:13 AM  Result Value Ref Range   Activated Clotting Time 214 seconds  POCT Activated clotting time     Status: None   Collection Time: 06/12/14  3:07 AM  Result Value Ref Range   Activated Clotting Time 202 seconds  POCT Activated clotting time     Status: None   Collection Time: 06/12/14  3:59 AM  Result Value Ref Range   Activated Clotting Time 208 seconds  Glucose, capillary     Status:  Abnormal   Collection Time: 06/12/14  4:12 AM  Result Value Ref Range   Glucose-Capillary 153 (H) 70 - 99 mg/dL  Magnesium     Status: Abnormal   Collection Time: 06/12/14  4:50 AM  Result Value Ref Range   Magnesium 2.6 (H) 1.5 - 2.5 mg/dL  APTT     Status: Abnormal   Collection Time: 06/12/14  4:50 AM  Result Value Ref Range   aPTT 72 (H) 24 - 37 seconds    Comment:        IF BASELINE aPTT IS ELEVATED, SUGGEST PATIENT RISK ASSESSMENT BE USED TO DETERMINE APPROPRIATE ANTICOAGULANT THERAPY.   Protime-INR     Status: Abnormal   Collection Time: 06/12/14  4:50 AM  Result Value Ref Range   Prothrombin Time 27.4 (H) 11.6 - 15.2 seconds   INR 2.52 (H) 0.00 - 1.49  CBC with Differential     Status: Abnormal   Collection Time: 06/12/14  4:50 AM  Result Value Ref Range   WBC 7.4 4.0 - 10.5 K/uL   RBC 4.48 4.22 - 5.81 MIL/uL   Hemoglobin 11.0 (L) 13.0 - 17.0 g/dL   HCT 35.0 (L) 39.0 - 52.0 %   MCV 78.1 78.0 - 100.0 fL   MCH 24.6 (L) 26.0 - 34.0 pg   MCHC 31.4 30.0 - 36.0 g/dL   RDW 21.2 (H) 11.5 - 15.5 %   Platelets 134 (L) 150 - 400 K/uL   Neutrophils Relative % 73 43 - 77 %   Lymphocytes Relative 13 12 - 46 %   Monocytes Relative 13 (H) 3 - 12 %   Eosinophils Relative 1 0 - 5 %   Basophils Relative 0 0 - 1 %   Neutro Abs 5.3 1.7 - 7.7 K/uL   Lymphs Abs 1.0 0.7 - 4.0 K/uL   Monocytes Absolute 1.0 0.1 - 1.0 K/uL   Eosinophils Absolute 0.1 0.0 - 0.7 K/uL   Basophils Absolute 0.0 0.0 - 0.1 K/uL   RBC Morphology TARGET CELLS   Basic metabolic panel     Status: Abnormal   Collection Time: 06/12/14  4:50 AM  Result Value Ref Range   Sodium 134 (L) 137 - 147 mEq/L   Potassium 4.4 3.7 - 5.3 mEq/L   Chloride 99 96 - 112 mEq/L   CO2 21 19 - 32 mEq/L   Glucose, Bld 143 (H) 70 - 99 mg/dL   BUN 15 6 - 23 mg/dL   Creatinine, Ser 1.47 (H) 0.50 - 1.35 mg/dL   Calcium 8.3 (L) 8.4 - 10.5 mg/dL   GFR calc non Af Amer 48 (L) >90 mL/min   GFR calc Af Amer 56 (L) >90 mL/min    Comment:  (NOTE) The eGFR has been calculated using the CKD EPI equation. This calculation has not been validated in all clinical situations. eGFR's persistently <90 mL/min signify possible Chronic Kidney Disease.    Anion gap 14 5 - 15  Albumin     Status: Abnormal   Collection Time: 06/12/14  4:50 AM  Result Value Ref Range   Albumin 2.7 (L) 3.5 - 5.2 g/dL  Pro b natriuretic peptide (BNP)     Status: Abnormal   Collection Time: 06/12/14  4:50 AM  Result Value Ref Range   Pro B Natriuretic peptide (BNP) 6716.0 (H) 0 - 125 pg/mL  Phosphorus  Status: Abnormal   Collection Time: 06/12/14  4:50 AM  Result Value Ref Range   Phosphorus 1.5 (L) 2.3 - 4.6 mg/dL  Glucose, capillary     Status: Abnormal   Collection Time: 06/12/14  8:05 AM  Result Value Ref Range   Glucose-Capillary 102 (H) 70 - 99 mg/dL  Glucose, capillary     Status: Abnormal   Collection Time: 06/12/14 12:24 PM  Result Value Ref Range   Glucose-Capillary 203 (H) 70 - 99 mg/dL   _0 (sdes,specrequest,cult,reptstatus)   ) Recent Results (from the past 720 hour(s))  MRSA PCR Screening     Status: None   Collection Time: 06/08/14  1:16 PM  Result Value Ref Range Status   MRSA by PCR NEGATIVE NEGATIVE Final    Comment:        The GeneXpert MRSA Assay (FDA approved for NASAL specimens only), is one component of a comprehensive MRSA colonization surveillance program. It is not intended to diagnose MRSA infection nor to guide or monitor treatment for MRSA infections.  Urine culture     Status: None   Collection Time: 06/09/14 10:32 AM  Result Value Ref Range Status   Specimen Description URINE, CATHETERIZED  Final   Special Requests Immunocompromised  Final   Culture  Setup Time   Final    06/09/2014 20:50 Performed at [place] Performed at [facility]   Final   Culture NO GROWTH Performed at [facility]   Final   Report Status 06/10/2014 FINAL   Final  Culture, blood (routine x 2)     Status: None   Collection Time: 06/10/14  3:39 AM  Result Value Ref Range Status   Specimen Description BLOOD LEFT HAND  Final   Special Requests BOTTLES DRAWN AEROBIC ONLY 4CC  Final   Culture  Setup Time   Final    06/10/2014 17:58 Performed at [facility]    Culture   Final    BACILLUS SPECIES Note: Standardized susceptibility testing for this organism is not available. Note: Gram Stain Report Called to,Read Back By and Verified With: BRYCE SHEPARD @ 3:58AM 11.2.15 BY DESIS Performed at [facility]    Report Status 06/12/2014 FINAL  Final  Culture, blood (routine x 2)     Status: None   Collection Time: 06/10/14  3:52 AM  Result Value Ref Range Status   Specimen Description BLOOD LEFT HAND  Final   Special Requests BOTTLES DRAWN AEROBIC ONLY 8CC  Final   Culture  Setup Time   Final    06/10/2014 17:58 Performed at [facility]    Culture   Final    BACILLUS SPECIES Note: Standardized susceptibility testing for this organism is not available. Note: Gram Stain Report Called to,Read Back By and Verified With: BRYCE SHEPARD @ 3:58AM 11.2.15 BY DESIS Culture results may be compromised due to an excessive volume of blood received in culture bottles. Performed at [facility]    Report Status 06/12/2014 FINAL  Final     Impression/Recommendation  Active Problems:   Acute respiratory failure   Cardiogenic shock   Acute on chronic systolic congestive heart failure   Acute on chronic renal failure   Hyperkalemia   Respiratory failure   [name] is a 63 y.o. male with  PICC line at home with dobutamine infusion now with admission for CHF, renal failure, hyperkalemia, bradycardia, fevers and found to be bacteremic with Bacillus (  likely cereus)  #1 Bacillus Cereus bacteremia: likely due to his improper non sterile manipulation of his line when he was changing infusion site. 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  --Will narrowed to vancomycin, given resistance to penicillin found with this organism. Carbopenems have been used as well --await further speciation and hopefully S  --KEY ISSUE IS HOW DO WE GIVE THIS GENTLEMAN A CATHETER HOLIDAY?,  --CAN WE FIND A POINT WHERE HE CAN BE OFF HD AND OFF DOBUTAMINE AND HAVE BOTH HIS PICC AND HD LINES REMOVED SO THAT WE CAN CLEAR HIS BACTEREMIA WITH LINE OUT, OR WILL WE HAVE TO KEEP A CENTRAL LINE IN PLACE AT ALL TIMES FOR HIS DOBUTAMINE?  WE COULD CONSIDER TEE TO LOOK FOR ENDOCARDITIS  #2 Screening: check HIV and hep panel   06/12/2014, 2:41 PM   Thank you so much for this interesting consult  [place] for [place] [phone] (pager) [phone] (office) 06/12/2014, 2:41 PM  Rhina [place] 06/12/2014, 2:41 PM

## 2014-06-12 NOTE — Progress Notes (Signed)
eLink Physician-Brief Progress Note Patient Name: [name] DOB: [birth year] MRN: [id number]   Date of Service  06/12/2014  HPI/Events of Note    eICU Interventions  Low phos -replted     Intervention Category Intermediate Interventions: Electrolyte abnormality - evaluation and management  Sabri Teal V. 06/12/2014, 4:22 PM

## 2014-06-12 NOTE — Progress Notes (Signed)
ANTICOAGULATION CONSULT NOTE - Follow Up Consult  Pharmacy Consult for Heparin Indication: atrial fibrillation and and apical thrombus  No Known Allergies  Patient Measurements: Height: 6' (182.9 cm) Weight: 239 lb 3.2 oz (108.5 kg) IBW/kg (Calculated) : 77.6 Heparin Dosing Weight: 92 kg  Vital Signs: Temp: 100.2 F (37.9 C) (11/03 1100) Temp Source: Core (Comment) (11/03 1700) BP: 95/58 mmHg (11/03 1100) Pulse Rate: 95 (11/03 1100)  Labs:  Recent Labs  06/10/14 0430  06/11/14 0415 06/11/14 1629 06/12/14 0450 06/12/14 1536 06/12/14 1646  HGB 11.3*  --  10.9*  --  11.0*  --   --   HCT 37.0*  --  35.9*  --  35.0*  --   --   PLT 195  --  153  --  134*  --   --   APTT >200*  --  130*  --  72*  --   --   LABPROT 37.1*  --  34.8*  --  27.4*  --  24.6*  INR 3.71*  --  3.43*  --  2.52*  --  2.20*  CREATININE 1.71*  < > 1.49* 1.30 1.47* 1.08  --   < > = values in this interval not displayed.  Estimated Creatinine Clearance: 86.8 mL/min (by C-G formula based on Cr of 1.08).  Assessment: 63 year old male with chronic heart failure, atrial fibrillation, and apical thrombus on coumadin PTA to start IV heparin when INR <2.   INR this morning still elevated at 2.52, a repeat drawn at 1646 still up at 2.2  Goal of Therapy:  INR 2-3 Monitor platelets by anticoagulation protocol: Yes   Plan:  1. Continue to hold anticoagulation. 2. Follow-up INR with AM labs and restart when INR <2.   Blong Busk D. Tremain Rucinski, PharmD, BCPS Clinical Pharmacist Pager: [phone] 06/12/2014 6:37 PM

## 2014-06-12 NOTE — Progress Notes (Signed)
PULMONARY / CRITICAL CARE MEDICINE   Name: [name] MaceKenneth [name] MRN: [id number] DOB: [birth year]    ADMISSION DATE:  06/08/2014 CONSULTATION DATE:  06/08/2014  REFERRING MD :  EDP  CHIEF COMPLAINT:  Resp failure and CHF  INITIAL PRESENTATION: 63 y/o M with PMH of DM, AF, Cocaine abuse & end stage CHF, EF of 15% who presented with renal failure, associated hyperkalemia, pulmonary edema, and acute respiratory failure.       SIGNIFICANT EVENTS: 10/30  Admitted with VDRF due to pulmonary edema, renal fx, anasarca 10/31  Weaning on PSV, goal for volume removal with CVVHD 11/01  Neg 1 L in last 24 hours.  Net neg 1L in last 24 hours, tmax 102, cultured overnight. Started on abx 06/10/14: cards consult: Options are very limited as he cannot get advanced therapies and wouldn't be candidate for outpatient HD. Thus IABP is not an option. He has clearly stated he would not want long-term intubation, PEG, trach or frequent shocks.   06/11/14: 5Lnegative on CVVH since admission. CVP still 20s wih BP soft MAP 72 but sbp 97 and renal planning to continue CRRT.  Still on dobutamine.  SCVO 2 improved to 67 on dobutamine 5mcg.  Blood culture shows gram variable rods    SUBJECTIVE/OVERNIGHT/INTERVAL HX 06/12/14: On dobutamine and CVVH. Cards recs: not an LVAD candidate, not a transplant candiate no and opd HD candidate due to ionotrpe dependent.  Current plan is for CVVH to help reduce volume overload and if this fails palliation is plan. Patient denies complaints  VITAL SIGNS: Temp:  [97.3 F (36.3 C)-98.9 F (37.2 C)] 98.9 F (37.2 C) (11/03 0800) Pulse Rate:  [53-96] 95 (11/03 0800) Resp:  [9-28] 18 (11/03 0800) BP: (75-105)/(48-88) 95/64 mmHg (11/03 0800) SpO2:  [82 %-100 %] 100 % (11/03 0800) Weight:  [108.5 kg (239 lb 3.2 oz)] 108.5 kg (239 lb 3.2 oz) (11/03 0500)  HEMODYNAMICS: CVP:  [20 mmHg] 20 mmHg  VENTILATOR SETTINGS:    INTAKE / OUTPUT:  Intake/Output Summary (Last 24 hours) at  06/12/14 1004 Last data filed at 06/12/14 0900  Gross per 24 hour  Intake  801.8 ml  Output   3268 ml  Net -2466.2 ml    PHYSICAL EXAMINATION: General:  Chronically ill appearing male in NAD Neuro:  Awake and interactive, moves all ext to command. HEENT:  [place]/AT, PERRL, EOM-I and MMM. Cardiovascular:  s1s2 rrr, no M/R/G. Lungs:  resp's even/non-labored on vent, lungs bilaterally distant, faint crackles lower (improved) Abdomen:  Soft, NT, ND and +BS, pitting abd edema  Musculoskeletal:  2+ edema bilaterally. Skin:  Intact.  LABS:  PULMONARY  Recent Labs Lab 06/08/14 1121 06/08/14 1145 06/08/14 1501 06/09/14 1000 06/10/14 0438 06/11/14 0445  PHART  --   --  7.298*  --   --   --   PCO2ART  --   --  32.9*  --   --   --   PO2ART  --   --  197.0*  --   --   --   HCO3  --   --  15.8*  --   --   --   TCO2 16  --  16.8  --   --   --   O2SAT  --  49.1 99.1 66.7 51.8 67.2    CBC  Recent Labs Lab 06/10/14 0430 06/11/14 0415 06/12/14 0450  HGB 11.3* 10.9* 11.0*  HCT 37.0* 35.9* 35.0*  WBC 11.5* 10.9* 7.4  PLT 195 153 134*  COAGULATION  Recent Labs Lab 06/08/14 1638 06/09/14 0358 06/10/14 0430 06/11/14 0415 06/12/14 0450  INR 2.79* 3.25* 3.71* 3.43* 2.52*    CARDIAC    Recent Labs Lab 06/08/14 1040  TROPONINI <0.30    Recent Labs Lab 06/08/14 1040 06/12/14 0450  PROBNP 11335.0* 6716.0*     CHEMISTRY  Recent Labs Lab 06/10/14 0430 06/10/14 1436 06/10/14 1600 06/11/14 0204 06/11/14 0415 06/11/14 1629 06/12/14 0450  NA 135*  --  134*  --  135* 134* 134*  K 5.1  --  4.7  --  4.4 4.3 4.4  CL 98  --  96  --  96 98 99  CO2 25  --  25  --  25 26 21   GLUCOSE 93  --  177*  --  129* 144* 143*  BUN 25*  --  25*  --  20 16 15   CREATININE 1.71*  --  1.76*  --  1.49* 1.30 1.47*  CALCIUM 8.7  --  8.4  --  8.6 8.3* 8.3*  MG 2.5 2.5  --  2.4 2.4  --  2.6*  PHOS 2.8  --  3.3  --  2.2* 1.3* 1.5*   Estimated Creatinine Clearance: 63.8 mL/min (by  C-G formula based on Cr of 1.47).   LIVER  Recent Labs Lab 06/08/14 1040 06/08/14 1216  06/08/14 1638  06/09/14 0358 06/10/14 0430 06/10/14 1600 06/11/14 0415 06/11/14 1629 06/12/14 0450  AST 85* 98*  --   --   --  492* 417*  --  360*  --   --   ALT 66* 76*  --   --   --  282* 363*  --  380*  --   --   ALKPHOS 95 94  --   --   --  77 95  --  87  --   --   BILITOT 3.8* 3.9*  --   --   --  2.8* 3.2*  --  3.0*  --   --   PROT 8.6* 8.4*  --   --   --  7.1 8.8*  --  7.9  --   --   ALBUMIN 3.4* 3.3*  < >  --   < > 3.0* 3.4* 3.1* 3.0* 2.8* 2.7*  INR 4.58* 4.75*  --  2.79*  --  3.25* 3.71*  --  3.43*  --  2.52*  < > = values in this interval not displayed.   INFECTIOUS  Recent Labs Lab 06/08/14 1219 06/11/14 2333  LATICACIDVEN 10.7* 1.7     ENDOCRINE CBG (last 3)   Recent Labs  06/11/14 2336 06/12/14 0412 06/12/14 0805  GLUCAP 162* 153* 102*         IMAGING x48h No results found.     ASSESSMENT / PLAN:  PULMONARY OETT 10/30>>>11/01 A:  Acute Respiratory Failure -  In setting of CHF, acute pulmonary edema, Extubated 06/10/14   - doing well resp wise 06/11/14 and 06/12/14  P:   Pulmonary toilet Trend CXR prn Wean O2 for sats > 92%  CARDIOVASCULAR CVL R PICC line (old)>>> A:  Acute on Chronic CHF -  Due to NICM, EF 15%, on dobutamine at home, end stage. Hx Cocaine Abuse - in remission x 3-4 months Bradycardia - wide complex with elevated K AF - on amio & coumadin Hx VT   - 06/11/14: SCVO2 improved to 67 on of dobutamine - 06/12/14 - maintained on dobuteamine  P:  Continue Dobutamine  at 5. CHF Team following, appreciate input Awaiting INR drp before starting heparin for A Fib and LV thrombus  RENAL A:   Acute on chronic renal failure - cr at [facility] on 04/16/14 was 1.6.    - on cVVH  P:   Continue CVVHD for volume removal, goal If this fails, palliate per cards input  GASTROINTESTINAL A:   No active issues P:   SUP:   protonix Start carb modified renal diet  HEMATOLOGIC A:   Coagulopathy - on coumadin for AFIB, supratherapeutic on admission s/p FFP   - still with residual coumadin related high INR 06/12/14  P:  Start  heparin per pharmacy when INR < 2  INFECTIOUS Results for orders placed or performed during the hospital encounter of 06/08/14  MRSA PCR Screening     Status: None   Collection Time: 06/08/14  1:16 PM  Result Value Ref Range Status   MRSA by PCR NEGATIVE NEGATIVE Final    Comment:        The GeneXpert MRSA Assay (FDA approved for NASAL specimens only), is one component of a comprehensive MRSA colonization surveillance program. It is not intended to diagnose MRSA infection nor to guide or monitor treatment for MRSA infections.  Urine culture     Status: None   Collection Time: 06/09/14 10:32 AM  Result Value Ref Range Status   Specimen Description URINE, CATHETERIZED  Final   Special Requests Immunocompromised  Final   Culture  Setup Time   Final    06/09/2014 20:50 Performed at [facility]    Colony Count NO GROWTH Performed at [facility]   Final   Culture NO GROWTH Performed at [facility]   Final   Report Status 06/10/2014 FINAL  Final  Culture, blood (routine x 2)     Status: None (Preliminary result)   Collection Time: 06/10/14  3:39 AM  Result Value Ref Range Status   Specimen Description BLOOD LEFT HAND  Final   Special Requests BOTTLES DRAWN AEROBIC ONLY 4CC  Final   Culture  Setup Time   Final    06/10/2014 17:58 Performed at [facility]    Culture   Final    GRAM VARIABLE ROD Note: Gram Stain Report Called to,Read Back By and Verified With: BRYCE SHEPARD @ 3:58AM 11.2.15 BY DESIS Performed at [facility]    Report Status PENDING  Incomplete  Culture, blood (routine x 2)     Status: None (Preliminary result)   Collection Time: 06/10/14  3:52 AM  Result Value Ref Range Status   Specimen Description BLOOD  LEFT HAND  Final   Special Requests BOTTLES DRAWN AEROBIC ONLY 8CC  Final   Culture  Setup Time   Final    06/10/2014 17:58 Performed at [facility]    Culture   Final    GRAM VARIABLE ROD Note: Gram Stain Report Called to,Read Back By and Verified With: BRYCE SHEPARD @ 3:58AM 11.2.15 BY DESIS Performed at [facility]    Report Status PENDING  Incomplete      A:   Fever - overnight 11/1 and growing variable rods 06/11/14. No report update on blood culture 06/12/14  P:   Monitor fever curve / leukocytosis   Abx: Vanco, start date 11/1, day 2/x (empiric) Abx: Zosyn, start date 11/1, day 2/x (empiric)  ENDOCRINE A:   DM   P:   SSI  NEUROLOGIC A:   ICU associated discomfort    -  denies pain 06/11/14 and 06/12/14 P:   RASS goal: 0   FAMILY  Updates: Patient updated, no family at bedside.  Code Status:  Patient indicated prior to intubation that he would want full code for now but no trach/peg. He is not a candiate for HD, LVAD, transplant   TODAY'S SUMMARY: CARDIO-RENAL SYNDROME: On dobutamine and CVVH. Cards recs: not an LVAD candidate, not a transplant candiate no and opd HD candidate due to ionotrpe dependent.  Current plan is for CVVH to help reduce volume overload and if this fails palliation is plan. Tx patient to cards service. PCCM off. Will ask for lateral transfer to 2H       Dr. Kalman Shan, M.D., [facility].C.P Pulmonary and Critical Care Medicine Staff Physician [facility] [place] [facility] Pager: [phone], If no answer or between  15:00h - 7:00h: call [phone]  06/12/2014 10:04 AM

## 2014-06-12 NOTE — Progress Notes (Deleted)
   Call from [employer] [facility]. She d/w Dr Luisa Hart. Ok to restart IV heparin gtt but if re-bleeds will hae to restart  PLAN  - for now start dvt prph dose 5000 units tid heparin and monitior   Dr. Kalman Shan, M.D., [facility].C.P Pulmonary and Critical Care Medicine Staff Physician [place] [facility] [place] [facility] and Critical Care Pager: [phone], If no answer or between  15:00h - 7:00h: call [phone]  06/12/2014 12:51 PM

## 2014-06-12 NOTE — Progress Notes (Signed)
CRITICAL VALUE ALERT  Critical value received:  Phosphorus 1.0   Date of notification:  06/12/2014  Time of notification:  1608  Critical value read back:Yes.    Nurse who received alert:  Gerre Pebbles  MD notified (1st page):  Dr Dunham/Dr Vassie Loll  Time of first page:  1608  MD notified (2nd page):  Time of second page:  Responding MD:  Dr Calvert Cantor  Time MD responded:  [phone]

## 2014-06-13 DIAGNOSIS — J811 Chronic pulmonary edema: Secondary | ICD-10-CM | POA: Insufficient documentation

## 2014-06-13 LAB — RENAL FUNCTION PANEL
ANION GAP: 13 (ref 5–15)
Albumin: 2.6 g/dL — ABNORMAL LOW (ref 3.5–5.2)
Albumin: 2.7 g/dL — ABNORMAL LOW (ref 3.5–5.2)
Anion gap: 16 — ABNORMAL HIGH (ref 5–15)
BUN: 10 mg/dL (ref 6–23)
BUN: 10 mg/dL (ref 6–23)
CALCIUM: 8.1 mg/dL — AB (ref 8.4–10.5)
CO2: 26 mEq/L (ref 19–32)
CO2: 21 mEq/L (ref 19–32)
Calcium: 8.4 mg/dL (ref 8.4–10.5)
Chloride: 100 mEq/L (ref 96–112)
Chloride: 97 mEq/L (ref 96–112)
Creatinine, Ser: 1.53 mg/dL — ABNORMAL HIGH (ref 0.50–1.35)
Creatinine, Ser: 1.48 mg/dL — ABNORMAL HIGH (ref 0.50–1.35)
GFR calc non Af Amer: 46 mL/min — ABNORMAL LOW (ref >90)
GFR, EST AFRICAN AMERICAN: 53 mL/min — AB (ref >90)
GFR, EST AFRICAN AMERICAN: 56 mL/min — AB (ref >90)
GFR, EST NON AFRICAN AMERICAN: 48 mL/min — AB (ref >90)
Glucose, Bld: 105 mg/dL — ABNORMAL HIGH (ref 70–99)
Glucose, Bld: 135 mg/dL — ABNORMAL HIGH (ref 70–99)
PHOSPHORUS: 2.6 mg/dL (ref 2.3–4.6)
PHOSPHORUS: 2.7 mg/dL (ref 2.3–4.6)
POTASSIUM: 3.6 meq/L — AB (ref 3.7–5.3)
Potassium: 4.2 mEq/L (ref 3.7–5.3)
SODIUM: 139 meq/L (ref 137–147)
SODIUM: 134 meq/L — AB (ref 137–147)

## 2014-06-13 LAB — POCT ACTIVATED CLOTTING TIME
ACTIVATED CLOTTING TIME: 197 s
ACTIVATED CLOTTING TIME: 208 s
Activated Clotting Time: 197 seconds
Activated Clotting Time: 197 seconds
Activated Clotting Time: 191 seconds

## 2014-06-13 LAB — CBC WITH DIFFERENTIAL/PLATELET
BASOS ABS: 0 10*3/uL (ref 0.0–0.1)
Basophils Relative: 0 % (ref 0–1)
Eosinophils Absolute: 0.1 10*3/uL (ref 0.0–0.7)
Eosinophils Relative: 1 % (ref 0–5)
HCT: 32.5 % — ABNORMAL LOW (ref 39.0–52.0)
HEMOGLOBIN: 10 g/dL — AB (ref 13.0–17.0)
Lymphocytes Relative: 17 % (ref 12–46)
Lymphs Abs: 1.1 10*3/uL (ref 0.7–4.0)
MCH: 23.8 pg — ABNORMAL LOW (ref 26.0–34.0)
MCHC: 30.8 g/dL (ref 30.0–36.0)
MCV: 77.4 fL — ABNORMAL LOW (ref 78.0–100.0)
MONOS PCT: 16 % — AB (ref 3–12)
Monocytes Absolute: 1 10*3/uL (ref 0.1–1.0)
NEUTROS ABS: 4.4 10*3/uL (ref 1.7–7.7)
NEUTROS PCT: 66 % (ref 43–77)
Platelets: 136 10*3/uL — ABNORMAL LOW (ref 150–400)
RBC: 4.2 MIL/uL — ABNORMAL LOW (ref 4.22–5.81)
RDW: 21 % — AB (ref 11.5–15.5)
WBC: 6.7 10*3/uL (ref 4.0–10.5)

## 2014-06-13 LAB — HEPATITIS PANEL, ACUTE
HCV AB: NEGATIVE
HEP B S AG: NEGATIVE
Hep A IgM: NONREACTIVE
Hep B C IgM: NONREACTIVE

## 2014-06-13 LAB — PROTIME-INR
INR: 1.93 — ABNORMAL HIGH (ref 0.00–1.49)
Prothrombin Time: 22.2 seconds — ABNORMAL HIGH (ref 11.6–15.2)

## 2014-06-13 LAB — GLUCOSE, CAPILLARY
GLUCOSE-CAPILLARY: 115 mg/dL — AB (ref 70–99)
GLUCOSE-CAPILLARY: 105 mg/dL — AB (ref 70–99)
GLUCOSE-CAPILLARY: 133 mg/dL — AB (ref 70–99)
GLUCOSE-CAPILLARY: 147 mg/dL — AB (ref 70–99)
Glucose-Capillary: 137 mg/dL — ABNORMAL HIGH (ref 70–99)

## 2014-06-13 LAB — HEPARIN LEVEL (UNFRACTIONATED): Heparin Unfractionated: 0.57 IU/mL (ref 0.30–0.70)

## 2014-06-13 LAB — VANCOMYCIN, TROUGH: Vancomycin Tr: 12.7 ug/mL (ref 10.0–20.0)

## 2014-06-13 LAB — MAGNESIUM: Magnesium: 2.5 mg/dL (ref 1.5–2.5)

## 2014-06-13 LAB — APTT: APTT: 71 s — AB (ref 24–37)

## 2014-06-13 LAB — HIV ANTIBODY (ROUTINE TESTING W REFLEX): HIV 1&2 Ab, 4th Generation: NONREACTIVE

## 2014-06-13 MED ORDER — HEPARIN (PORCINE) IN NACL 100-0.45 UNIT/ML-% IJ SOLN
2000.0000 [IU]/h | INTRAMUSCULAR | Status: DC
  Administered 2014-06-13: 1250 [IU]/h via INTRAVENOUS
  Administered 2014-06-14 – 2014-06-20 (×6): 1050 [IU]/h via INTRAVENOUS
  Administered 2014-06-21: 1750 [IU]/h via INTRAVENOUS
  Administered 2014-06-21: 1050 [IU]/h via INTRAVENOUS
  Administered 2014-06-22: 1750 [IU]/h via INTRAVENOUS
  Administered 2014-06-23: 1950 [IU]/h via INTRAVENOUS
  Administered 2014-06-24 (×2): 2100 [IU]/h via INTRAVENOUS
  Administered 2014-06-25: 2000 [IU]/h via INTRAVENOUS
  Administered 2014-06-25: 2100 [IU]/h via INTRAVENOUS
  Administered 2014-06-26 – 2014-06-27 (×3): 2000 [IU]/h via INTRAVENOUS
  Filled 2014-06-13 (×33): qty 250

## 2014-06-13 MED ORDER — VANCOMYCIN HCL 10 G IV SOLR
1500.0000 mg | INTRAVENOUS | Status: DC
  Administered 2014-06-14 – 2014-06-17 (×4): 1500 mg via INTRAVENOUS
  Filled 2014-06-13 (×4): qty 1500

## 2014-06-13 MED ORDER — NEPRO/CARBSTEADY PO LIQD
237.0000 mL | Freq: Two times a day (BID) | ORAL | Status: DC
  Administered 2014-06-14: 237 mL via ORAL
  Administered 2014-06-18: 10:00:00 via ORAL
  Administered 2014-06-18 – 2014-06-28 (×7): 237 mL via ORAL
  Filled 2014-06-13 (×24): qty 237

## 2014-06-13 NOTE — Progress Notes (Signed)
NUTRITION FOLLOW UP  Intervention:    Nepro Shake po BID, each supplement provides 425 kcal and 19 grams protein  Nutrition Dx:   Inadequate oral intake related to poor appetite as evidenced by 50% meal completion.  Goal:   Intake to meet >90% of estimated nutrition needs.  Monitor:   PO intake, labs, weight trend.  Assessment:   63 year old with end stage CHF with EF of 15% who presented with hyperkalemia, pulmonary edema, respiratory failure and renal failure. Presented with SOB, respiratory failure and severe anasarca.  Patient self extubated on Sunday per RN. Diet has been advanced to renal/CHO modified with 1200 ml fluid restriction. Per discussion with RN, patient is consuming half of meals. Patient has been receiving CRRT since 10/30 for volume control. Weight trending down with volume removal. Patient with increased protein and calorie needs for CRRT. Would benefit from a PO supplement to maximize intake.  Height: Ht Readings from Last 1 Encounters:  06/08/14 6' (1.829 m)    Weight Status:   Wt Readings from Last 1 Encounters:  06/13/14 235 lb 7.2 oz (106.8 kg)   06/08/14 280 lb (127.007 kg)    Re-estimated needs:  Kcal: 2300-2500 Protein: 150 gm Fluid: 2.3 L  Skin: intact  Diet Order: Diet renal/carb modified with 1200 ml fluid restriction   Intake/Output Summary (Last 24 hours) at 06/13/14 1445 Last data filed at 06/13/14 1400  Gross per 24 hour  Intake 1243.35 ml  Output   3544 ml  Net -2300.65 ml    Last BM: 11/2   Labs:   Recent Labs Lab 06/11/14 0415  06/12/14 0450 06/12/14 1536 06/13/14 0449  NA 135*  < > 134* 134* 139  K 4.4  < > 4.4 3.7 3.6*  CL 96  < > 99 98 100  CO2 25  < > 21 26 26   BUN 20  < > 15 10 10   CREATININE 1.49*  < > 1.47* 1.08 1.53*  CALCIUM 8.6  < > 8.3* 8.1* 8.4  MG 2.4  --  2.6*  --  2.5  PHOS 2.2*  < > 1.5* 1.0* 2.6  GLUCOSE 129*  < > 143* 121* 105*  < > = values in this interval not displayed.  CBG (last 3)    Recent Labs  06/13/14 0341 06/13/14 0806 06/13/14 1234  GLUCAP 115* 105* 137*    Scheduled Meds: . amiodarone  200 mg Oral Daily  . antiseptic oral rinse  7 mL Mouth Rinse BID  . calcitRIOL  0.25 mcg Oral Daily  . insulin aspart  2-6 Units Subcutaneous 6 times per day  . pantoprazole  40 mg Oral Daily  . vancomycin  1,250 mg Intravenous Q24H    Continuous Infusions: . DOBUTamine 6 mcg/kg/min (06/13/14 1226)  . heparin 10,000 units/ 20 mL infusion syringe 1,350 Units/hr (06/13/14 0405)  . heparin 1,250 Units/hr (06/13/14 0814)  . dialysis replacement fluid (prismasate) 800 mL/hr at 06/13/14 0901  . dialysis replacement fluid (prismasate) 700 mL/hr at 06/13/14 0647  . dialysate (PRISMASATE) 2,000 mL/hr at 06/13/14 1226    Joaquin Courts, [place], LDN, CNSC Pager [phone] After Hours Pager [phone]

## 2014-06-13 NOTE — Progress Notes (Addendum)
ANTICOAGULATION CONSULT NOTE - Follow Up Consult  Pharmacy Consult for Heparin Indication: atrial fibrillation and and apical thrombus  No Known Allergies  Patient Measurements: Height: 6' (182.9 cm) Weight: 235 lb 7.2 oz (106.8 kg) IBW/kg (Calculated) : 77.6 Heparin Dosing Weight: 92 kg  Vital Signs: Temp: 99.4 F (37.4 C) (11/04 0500) BP: 91/65 mmHg (11/04 0500) Pulse Rate: 96 (11/04 0500)  Labs:  Recent Labs  06/11/14 0415  06/12/14 0450 06/12/14 1536 06/12/14 1646 06/13/14 0449  HGB 10.9*  --  11.0*  --   --  10.0*  HCT 35.9*  --  35.0*  --   --  32.5*  PLT 153  --  134*  --   --  136*  APTT 130*  --  72*  --   --  71*  LABPROT 34.8*  --  27.4*  --  24.6* 22.2*  INR 3.43*  --  2.52*  --  2.20* 1.93*  CREATININE 1.49*  < > 1.47* 1.08  --  1.53*  < > = values in this interval not displayed.  Estimated Creatinine Clearance: 60.8 mL/min (by C-G formula based on Cr of 1.53).  Assessment: 63 year old male with h/o  atrial fibrillation and apical thrombus, Coumadin on hold, for heparin   Goal of Therapy:  Heparin level 0.3-0.7 Monitor platelets by anticoagulation protocol: Yes   Plan:  Start heparin 1250 units/hr Check heparin level in 8 hours.  Geannie Risen, PharmD, BCPS  06/13/2014 6:13 AM

## 2014-06-13 NOTE — Progress Notes (Signed)
[facility] Rounding Note  Subjective: Continues to be without complaint. Says he is preparing himself for his kidneys not getting better.   Objective: Vital signs in last 24 hours: Temp:  [98.9 F (37.2 C)-100.2 F (37.9 C)] 99.8 F (37.7 C) (11/04 0700) Pulse Rate:  [85-97] 92 (11/04 0700) Resp:  [8-30] 25 (11/04 0700) BP: (79-100)/(47-67) 100/62 mmHg (11/04 0700) SpO2:  [95 %-100 %] 98 % (11/04 0700) Weight:  [235 lb 7.2 oz (106.8 kg)] 235 lb 7.2 oz (106.8 kg) (11/04 0500) Weight change: -3 lb 12 oz (-1.7 kg)  Intake/Output from previous day: 11/03 0701 - 11/04 0700 In: 1765.4 [P.O.:510; I.V.:190.4; IV Piggyback:825] Out: 4142 [Urine:147]  Down -2.3L in last 24 hrs; Net - 6.8 L since admit  Weight Trending 10/30  112.4 kg 10/31  114.1 kg 11/1    113.5 kg 11/2    111.2 kg     11/3     108.5 kg   11/4     106.8 kg     Physical Examination: BP 100/62 mmHg  Pulse 92  Temp(Src) 99.8 F (37.7 C) (Core (Comment))  Resp 25  Ht 6' (1.829 m)  Wt 235 lb 7.2 oz (106.8 kg)  BMI 31.93 kg/m2  SpO2 98%  CVP 23 Awake; A&Ox3  VS as above RRR; S1S2 + s3 w/ 2/6 systolic murmur Lungs CTAB  2+ edema bilat notable at hips and posterior thigh PICC in right arm, trialysis catheter in right groin (10/30) Foley with minimal muddy brown urine  Recent Labs  06/12/14 0450 06/13/14 0449  WBC 7.4 6.7  HGB 11.0* 10.0*  HCT 35.0* 32.5*  PLT 134* 136*    Recent Labs  06/12/14 1536 06/13/14 0449  NA 134* 139  K 3.7 3.6*  CL 98 100  CO2 26 26  GLUCOSE 121* 105*  BUN 10 10  CREATININE 1.08 1.53*  CALCIUM 8.1* 8.4  Phosphorus                         2.6 Results for Cuen, [name] (MRN [id number]) as of 06/11/2014 07:18  Ref. Range 06/09/2014 07:30  Iron Latest Range: 42-135 ug/dL 21 (L)  UIBC Latest Range: 125-400 ug/dL 409320  TIBC Latest Range: 215-435 ug/dL 811341  Saturation Ratios Latest Range: 20-55 % 6 (L)    Studies/Results: No results  found. Scheduled Medications . amiodarone  200 mg Oral Daily  . antiseptic oral rinse  7 mL Mouth Rinse BID  . calcitRIOL  0.25 mcg Oral Daily  . insulin aspart  2-6 Units Subcutaneous 6 times per day  . pantoprazole  40 mg Oral Daily  . phosphorus  500 mg Oral TID  . vancomycin  1,250 mg Intravenous Q24H   Infusion/other Medications . DOBUTamine 7.5 mcg/kg/min (06/13/14 0100)  . heparin 10,000 units/ 20 mL infusion syringe 1,350 Units/hr (06/13/14 0405)  . heparin 1,250 Units/hr (06/13/14 [id number])  . dialysis replacement fluid (prismasate) 800 mL/hr at 06/13/14 0107  . dialysis replacement fluid (prismasate) 700 mL/hr at 06/13/14 0647  . 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   He was admitted 10/30 with respiratory failure, worsening heart failure, creatinine of 4.6, hyperkalemia (6.8) and wide complex  bradycardia. CRRT initiated 10/30 for volume control, renal failure. Has BC+ 11/1 for Bacillus on Vanc (ID following). He is not considered a candidate for outpt HD given need for chronic IV inotropes.    Assessment/Plan:  AKI on CKD4  Remains oliguric. Continue CRRT 4K bath; Rate neg 100 to 150 hour  to facilitate fluid removal (Discussed with Dr Gala Romney) as his vitals are currently stable and hold off on Catheter Holiday until fluid status has improved. Plan for Catheter holiday when we attempt IHD and see if renal fcn will improve. Heparin. Unclear how much recovery he will get. CVP at 23 and volume still up. Pt is not candidate for heart transplant or pump therapy. Would be very poor candidate for outpt HD given need for dobutamine for past several months. If unable to recover renal fcn will need to discuss palliative  options.     CM with acute on chronic systolic heart failure/NICM  EF < 15% - on dobutamine. Per cardiology.   Hypophosphatemia: 1.0 on 11/3.Replaced with sodium phosphate x 1. Recheck Phos at 4pm  H/O VT  PAF: INR 1.93 (11/4) IV heparin per Pharm  Anemia stable. Fe deficient but withhold IV iron in setting of bacteremia  PTH: Elevated at 168: Calcitriol (0.25 mcg) PO qd  Bacillus bacteremia (11/1). ID following. On vanco (11/1>>); zosyn stopped (11/1>>11/3)  Needs Catheter Holiday - Will continue with CRRT for fluid removal as he is currently stable and plan to remove line when we attempt to transition to IHD  Cocaine abuse - none for 4 months     LOS: 5 days   Wenda Low 06/13/2014,7:55 AM  I have seen and examined this patient and agree with plan and assessment in the above note. Remains oligoanuric, tolerating CRRT, weight down 6 kg or so, CVP still 23. I am pessimistic about aby renal recovery and pt aware he may ultimatley come to palliation.  Has bacillus cereus bacteremia and catheter holiday has been recommended.  OK with me whenever this needs to happen - Dr. Teressa Lower would like for [place] to continue CRRT and get him as dry as possible so will continue for now. Requiring IV phos repletion. Rec'd 30 mmoles last evening - 2.6 this AM for recheck at 4PM.     Kelia Gibbon B,MD 06/13/2014 11:14 AM

## 2014-06-13 NOTE — Progress Notes (Signed)
[facility]      Subjective: No new complaints   Antibiotics:  Anti-infectives    Start     Dose/Rate Route Frequency Ordered Stop   06/14/14 0800  vancomycin (VANCOCIN) 1,500 mg in sodium chloride 0.9 % 250 mL IVPB     1,500 mg125 mL/hr over 120 Minutes Intravenous Every 24 hours 06/13/14 1528     06/10/14 1000  piperacillin-tazobactam (ZOSYN) IVPB 3.375 g  Status:  Discontinued     3.375 g100 mL/hr over 30 Minutes Intravenous Every 6 hours 06/10/14 0918 06/12/14 1318   06/10/14 1000  vancomycin (VANCOCIN) 1,250 mg in sodium chloride 0.9 % 250 mL IVPB  Status:  Discontinued     1,250 mg166.7 mL/hr over 90 Minutes Intravenous Every 24 hours 06/10/14 0918 06/13/14 1528      Medications: Scheduled Meds: . amiodarone  200 mg Oral Daily  . antiseptic oral rinse  7 mL Mouth Rinse BID  . calcitRIOL  0.25 mcg Oral Daily  . feeding supplement (NEPRO CARB STEADY)  237 mL Oral BID BM  . insulin aspart  2-6 Units Subcutaneous 6 times per day  . pantoprazole  40 mg Oral Daily  . [START ON 06/14/2014] vancomycin  1,500 mg Intravenous Q24H   Continuous Infusions: . DOBUTamine 6 mcg/kg/min (06/13/14 1226)  . heparin 10,000 units/ 20 mL infusion syringe 1,350 Units/hr (06/13/14 1536)  . heparin 1,250 Units/hr (06/13/14 [id number])  . dialysis replacement fluid (prismasate) 800 mL/hr at 06/13/14 1450  . dialysis replacement fluid (prismasate) 700 mL/hr at 06/13/14 1504  . dialysate (PRISMASATE) 2,000 mL/hr at 06/13/14 1505   PRN Meds:.heparin, heparin, sodium chloride    Objective: Weight change: -3 lb 12 oz (-1.7 kg)  Intake/Output Summary (Last 24 hours) at 06/13/14 1719 Last data filed at 06/13/14 1700  Gross per 24 hour  Intake 1249.35 ml  Output   3298 ml  Net -2048.65 ml   Blood pressure 100/63, pulse 89, temperature 98.7 F (37.1 C), temperature source Core (Comment), resp. rate 16, height 6' (1.829 m), weight 235 lb 7.2 oz (106.8 kg), SpO2 96  %. Temp:  [98.7 F (37.1 C)-99.9 F (37.7 C)] 98.7 F (37.1 C) (11/04 1700) Pulse Rate:  [85-104] 89 (11/04 1700) Resp:  [9-25] 16 (11/04 1700) BP: (79-118)/(47-72) 100/63 mmHg (11/04 1500) SpO2:  [83 %-100 %] 96 % (11/04 1700) Weight:  [235 lb 7.2 oz (106.8 kg)] 235 lb 7.2 oz (106.8 kg) (11/04 0500)  Physical Exam: General: Alert and awake, oriented x3, under a cooling blanket HEENT: anicteric sclera, pupils reactive to light and accommodation, EOMI, oropharynx clear and without exudate CVS tachycardic raterate, 2/6 systolic murmur at PMI Chest: clear to auscultation bilaterally anteriorly Abdomen: soft nontender,distended, normal bowel sounds, Extremities: 2+ edema noted bilaterally Neuro: nonfocal, strength and sensation intact Skin: PICC line intact dialysis line in place  CBC:  CBC Latest Ref Rng 06/13/2014 06/12/2014 06/11/2014  WBC 4.0 - 10.5 K/uL 6.7 7.4 10.9(H)  Hemoglobin 13.0 - 17.0 g/dL 10.0(L) 11.0(L) 10.9(L)  Hematocrit 39.0 - 52.0 % 32.5(L) 35.0(L) 35.9(L)  Platelets 150 - 400 K/uL 136(L) 134(L) 153      BMET  Recent Labs  06/13/14 0449 06/13/14 1515  NA 139 134*  K 3.6* 4.2  CL 100 97  CO2 26 21  GLUCOSE 105* 135*  BUN 10 10  CREATININE 1.53* 1.48*  CALCIUM 8.4 8.1*     Liver Panel   Recent Labs  06/11/14 0415  06/13/14 0449  06/13/14 1515  PROT 7.9  --   --   --   ALBUMIN 3.0*  < > 2.6* 2.7*  AST 360*  --   --   --   ALT 380*  --   --   --   ALKPHOS 87  --   --   --   BILITOT 3.0*  --   --   --   < > = values in this interval not displayed.     Sedimentation Rate No results for input(s): ESRSEDRATE in the last 72 hours. C-Reactive Protein No results for input(s): CRP in the last 72 hours.  Micro Results: Recent Results (from the past 720 hour(s))  MRSA PCR Screening     Status: None   Collection Time: 06/08/14  1:16 PM  Result Value Ref Range Status   MRSA by PCR NEGATIVE NEGATIVE Final    Comment:        The GeneXpert MRSA  Assay (FDA approved for NASAL specimens only), is one component of a comprehensive MRSA colonization surveillance program. It is not intended to diagnose MRSA infection nor to guide or monitor treatment for MRSA infections.  Urine culture     Status: None   Collection Time: 06/09/14 10:32 AM  Result Value Ref Range Status   Specimen Description URINE, CATHETERIZED  Final   Special Requests Immunocompromised  Final   Culture  Setup Time   Final    06/09/2014 20:50 Performed at [facility]    Colony Count NO GROWTH Performed at [facility]   Final   Culture NO GROWTH Performed at [facility]   Final   Report Status 06/10/2014 FINAL  Final  Culture, blood (routine x 2)     Status: None   Collection Time: 06/10/14  3:39 AM  Result Value Ref Range Status   Specimen Description BLOOD LEFT HAND  Final   Special Requests BOTTLES DRAWN AEROBIC ONLY 4CC  Final   Culture  Setup Time   Final    06/10/2014 17:58 Performed at [facility]    Culture   Final    BACILLUS SPECIES Note: Standardized susceptibility testing for this organism is not available. Note: Gram Stain Report Called to,Read Back By and Verified With: BRYCE SHEPARD @ 3:58AM 11.2.15 BY DESIS Performed at [facility]    Report Status 06/12/2014 FINAL  Final  Culture, blood (routine x 2)     Status: None   Collection Time: 06/10/14  3:52 AM  Result Value Ref Range Status   Specimen Description BLOOD LEFT HAND  Final   Special Requests BOTTLES DRAWN AEROBIC ONLY 8CC  Final   Culture  Setup Time   Final    06/10/2014 17:58 Performed at [facility]    Culture   Final    BACILLUS SPECIES Note: Standardized susceptibility testing for this organism is not available. Note: Gram Stain Report Called to,Read Back By and Verified With: BRYCE SHEPARD @ 3:58AM 11.2.15 BY DESIS Culture results may be compromised due to an excessive volume of blood received in culture  bottles. Performed at [facility]    Report Status 06/12/2014 FINAL  Final  Culture, blood (routine x 2)     Status: None (Preliminary result)   Collection Time: 06/12/14  1:54 PM  Result Value Ref Range Status   Specimen Description BLOOD LEFT HAND  Final   Special Requests   Final    BOTTLES DRAWN AEROBIC AND ANAEROBIC BLUE 10CC  RED 5CC   Culture  Setup Time   Final    06/12/2014 20:35 Performed at [facility]    Culture   Final           BLOOD CULTURE RECEIVED NO GROWTH TO DATE CULTURE WILL BE HELD FOR 5 DAYS BEFORE ISSUING A FINAL NEGATIVE REPORT Performed at [facility]    Report Status PENDING  Incomplete  Culture, blood (routine x 2)     Status: None (Preliminary result)   Collection Time: 06/12/14  2:05 PM  Result Value Ref Range Status   Specimen Description BLOOD LEFT ARM  Final   Special Requests BOTTLES DRAWN AEROBIC AND ANAEROBIC 10CC  Final   Culture  Setup Time   Final    06/12/2014 20:35 Performed at [facility]    Culture   Final           BLOOD CULTURE RECEIVED NO GROWTH TO DATE CULTURE WILL BE HELD FOR 5 DAYS BEFORE ISSUING A FINAL NEGATIVE REPORT Performed at [facility]    Report Status PENDING  Incomplete    Studies/Results: No results found.    Assessment/Plan:  Active Problems:   Acute respiratory failure   Cardiogenic shock   Acute on chronic systolic congestive heart failure   Acute on chronic renal failure   Hyperkalemia   Respiratory failure   Bacteremia associated with intravascular line   Blood poisoning   Acute respiratory failure with hypoxia   AKI (acute kidney injury)   Screen for STD (sexually transmitted disease)    [name] is a 63 y.o. 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 Lab does not speciate beyond saying this non anthrax Bacillus and an aerobe, no sensis available  -- narrowed to vancomycin, given resistance to penicillin found with this organism' -- Cardiology and Nephrology to try to get all lines out once sufficient volume is removed via HD --COULD CONSIDER TEE TO LOOK FOR ENDOCARDITIS  #2 Screening:  HIV and hep panel negative     LOS: 5 days   Acey Lav 06/13/2014, 5:19 PM

## 2014-06-13 NOTE — Progress Notes (Signed)
Patient ID: [name], male   DOB: Mar 03, 1951, 63 y.o.   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. Maintained on dobutamine 11mcg/kg/min.   Past medical h/o is notable for CKD with baseline Cr 3.0, cocaine abuse, DM2, AF on amio, VT. He has not been candidate for advanced therapies due to cocaine abuse (quit 4 months ago) and renal failure. Started on dobutamine. Moved to [place] to try to stay off cocaine.   On Oct 14 seen at [facility]. CR was 3.3 (on 04/16/2014 Cr was 1.6 at [facility] on discharge) and potassium was low. Kcl was increased. Over past week increased dyspnea and edema. NYHA IIIB symptoms at baseline.   10/30 Admitted with rep failure with wide complex bradycardia. K 6.8 and Cr 4.6. Intubated and CVVHD started.  Extubated 11/1.  Continues on CVVHD.   Last night dobutamine increased to 10 for MAP in 50s. Now down to 7.5. Feels ok. CVVHD at 50/hr. CVP remains 17-18. Bcx + for bacillus. ID requesting line holiday. U/o < 10cc/hr  Objective:   Weight Range:  Vital Signs:   Temp:  [98.7 F (37.1 C)-99.9 F (37.7 C)] 98.7 F (37.1 C) (11/04 1700) Pulse Rate:  [85-104] 89 (11/04 1700) Resp:  [9-25] 16 (11/04 1700) BP: (79-118)/(47-72) 100/63 mmHg (11/04 1500) SpO2:  [83 %-100 %] 96 % (11/04 1700) Weight:  [106.8 kg (235 lb 7.2 oz)] 106.8 kg (235 lb 7.2 oz) (11/04 0500) Last BM Date: 06/11/14  Weight change: Filed Weights   06/11/14 0430 06/12/14 0500 06/13/14 0500  Weight: 111.2 kg (245 lb 2.4 oz) 108.5 kg (239 lb 3.2 oz) 106.8 kg (235 lb 7.2 oz)    Intake/Output:   Intake/Output Summary (Last 24 hours) at 06/13/14 1800 Last data filed at 06/13/14 1700  Gross per 24 hour  Intake 1189.45 ml  Output   3282 ml  Net -2092.55 ml     Physical Exam: CVP 17-18 General: NAD  Awake. Bair hugger in place HEENT: normal  Neck: supple. JVP to ear.  Carotids 2+  bilat; no bruits. No lymphadenopathy or thryomegaly appreciated.  Cor: PMI laterally displaced. Regular rate & rhythm. 2/6 MR +s3  Lungs: + crackles  Abdomen: soft, nontender, + distended. No hepatosplenomegaly. No bruits or masses. Good bowel sounds.  Extremities: no cyanosis, clubbing, rash, 2-3+ edema R and LLE to thighs  SCDs femoral trialysis catheter Neuro: alert & orientedx3, cranial nerves grossly intact. moves all 4 extremities w/o difficulty. Affect pleasant   Telemetry: SR 90s  Labs: Basic Metabolic Panel:  Recent Labs Lab 06/10/14 1436  06/11/14 0204 06/11/14 0415 06/11/14 1629 06/12/14 0450 06/12/14 1536 06/13/14 0449 06/13/14 1515  NA  --   < >  --  135* 134* 134* 134* 139 134*  K  --   < >  --  4.4 4.3 4.4 3.7 3.6* 4.2  CL  --   < >  --  96 98 99 98 100 97  CO2  --   < >  --  25 26 21 26 26 21   GLUCOSE  --   < >  --  129* 144* 143* 121* 105* 135*  BUN  --   < >  --  20 16 15 10 10 10   CREATININE  --   < >  --  1.49* 1.30 1.47* 1.08 1.53* 1.48*  CALCIUM  --   < >  --  8.6 8.3* 8.3* 8.1* 8.4 8.1*  MG 2.5  --  2.4 2.4  --  2.6*  --  2.5  --   PHOS  --   < >  --  2.2* 1.3* 1.5* 1.0* 2.6 2.7  < > = values in this interval not displayed.  Liver Function Tests:  Recent Labs Lab 06/08/14 1040 06/08/14 1216  06/09/14 0358 06/10/14 0430  06/11/14 0415 06/11/14 1629 06/12/14 0450 06/12/14 1536 06/13/14 0449 06/13/14 1515  AST 85* 98*  --  492* 417*  --  360*  --   --   --   --   --   ALT 66* 76*  --  282* 363*  --  380*  --   --   --   --   --   ALKPHOS 95 94  --  77 95  --  87  --   --   --   --   --   BILITOT 3.8* 3.9*  --  2.8* 3.2*  --  3.0*  --   --   --   --   --   PROT 8.6* 8.4*  --  7.1 8.8*  --  7.9  --   --   --   --   --   ALBUMIN 3.4* 3.3*  < > 3.0* 3.4*  < > 3.0* 2.8* 2.7* 2.6* 2.6* 2.7*  < > = values in this interval not displayed. No results for input(s): LIPASE, AMYLASE in the last 168 hours. No results for input(s): AMMONIA in the last 168  hours.  CBC:  Recent Labs Lab 06/08/14 1216 06/09/14 0358 06/10/14 0430 06/11/14 0415 06/12/14 0450 06/13/14 0449  WBC 7.3 5.9 11.5* 10.9* 7.4 6.7  NEUTROABS 6.2  --   --   --  5.3 4.4  HGB 12.0* 9.3* 11.3* 10.9* 11.0* 10.0*  HCT 38.6* 29.1* 37.0* 35.9* 35.0* 32.5*  MCV 78.8 75.4* 79.7 80.7 78.1 77.4*  PLT 178 161 195 153 134* 136*    Cardiac Enzymes:  Recent Labs Lab 06/08/14 1040  TROPONINI <0.30    BNP: BNP (last 3 results)  Recent Labs  06/08/14 1040 06/12/14 0450  PROBNP 11335.0* 6716.0*     Other results:    Imaging: No results found.   Medications:     Scheduled Medications: . amiodarone  200 mg Oral Daily  . antiseptic oral rinse  7 mL Mouth Rinse BID  . calcitRIOL  0.25 mcg Oral Daily  . feeding supplement (NEPRO CARB STEADY)  237 mL Oral BID BM  . insulin aspart  2-6 Units Subcutaneous 6 times per day  . pantoprazole  40 mg Oral Daily  . [START ON 06/14/2014] vancomycin  1,500 mg Intravenous Q24H    Infusions: . DOBUTamine 6 mcg/kg/min (06/13/14 1226)  . heparin 10,000 units/ 20 mL infusion syringe 1,350 Units/hr (06/13/14 1536)  . heparin 1,250 Units/hr (06/13/14 3662)  . dialysis replacement fluid (prismasate) 800 mL/hr at 06/13/14 1450  . dialysis replacement fluid (prismasate) 700 mL/hr at 06/13/14 1504  . dialysate (PRISMASATE) 2,000 mL/hr at 06/13/14 1754    PRN Medications: heparin, heparin, sodium chloride   Assessment:   1. Acute respiratory failure  2. A/c systolic HF due to NICM EF < 15%  3. Acute on chronic renal failure stage IV  4. Hyperkalemia  5. Cardiogenic shock  6. DM2  7. Cocaine abuse in remission 3-4 months  8. H/o gout  9. Supratherapeutic INR  10. LV thrombus  11. PAF on amio  12. H/o VT 13. Fever 11/1   Plan/Discussion:    He remains markedly volume overload. CVVHD rate limited by low BP.  Essentially anuric. Will cut dobutamine back to 6 mcg/kg/min. BCX + for bacillus - ID requesting line  holiday.   At this point his only hope for survival is for his kidneys to recover enough to maintain his volume status without HD support. Thus would continue CVVHD until nearly all fluid is removed. Once fluid is removed then I think we can remove central lines and give dobutamine peripherally in order to give him a line holiday and check surveillance cultures.   If kidneys don't recover, we would need to switch to comfort care mode.   The patient is critically ill with multiple organ systems failure and requires high complexity decision making for assessment and support, frequent evaluation and titration of therapies, application of advanced monitoring technologies and extensive interpretation of multiple databases.   Critical Care Time devoted to patient care services described in this note is 35 Minutes.   Coulton Schlink,MD 6:00 PM

## 2014-06-13 NOTE — Progress Notes (Signed)
ANTICOAGULATION/ANTIBIOTIC CONSULT NOTE - Follow Up Consult  Pharmacy Consult for Heparin and Vancomycin Indication: atrial fibrillation and and apical thrombus  No Known Allergies  Patient Measurements: Height: 6' (182.9 cm) Weight: 235 lb 7.2 oz (106.8 kg) IBW/kg (Calculated) : 77.6 Heparin Dosing Weight: 92 kg  Vital Signs: Temp: 98.8 F (37.1 C) (11/04 1500) BP: 100/63 mmHg (11/04 1500) Pulse Rate: 87 (11/04 1500)  Labs:  Recent Labs  06/11/14 0415  06/12/14 0450 06/12/14 1536 06/12/14 1646 06/13/14 0449 06/13/14 1400  HGB 10.9*  --  11.0*  --   --  10.0*  --   HCT 35.9*  --  35.0*  --   --  32.5*  --   PLT 153  --  134*  --   --  136*  --   APTT 130*  --  72*  --   --  71*  --   LABPROT 34.8*  --  27.4*  --  24.6* 22.2*  --   INR 3.43*  --  2.52*  --  2.20* 1.93*  --   HEPARINUNFRC  --   --   --   --   --   --  0.57  CREATININE 1.49*  < > 1.47* 1.08  --  1.53*  --   < > = values in this interval not displayed.  Estimated Creatinine Clearance: 60.8 mL/min (by C-G formula based on Cr of 1.53).  Assessment: AC: 63 year old male with h/o  atrial fibrillation and apical thrombus. Coumadin on hold, INR down to 1.93, so heparin bridge started this a.m. Heparin level 0.57 - therapeutic. No bleeding noted.  ID: Vanc D#4 for bacillus cereus bacteremia (likely due to nonsterile manipulation of dobutamine line at home). Pt denies h/o IVDA. ID following - would like to find a way to have line holiday if possible. Continues on CVVHD. WBC wnl. LA 10.7 >>1.7. Tm down to 100.2.   Vancomycin trough 12.7 mcg/ml (slightly subtherapeutic) on 1250mg  IV q24h  11/1 Vanc>> 11/1 Zosyn>>11/3 10/31 urine>>neg 11/1 bld cx>> 2/2 bacillus species 11/3 Bld cx>>  Goal of Therapy:  Heparin level 0.3-0.7 Monitor platelets by anticoagulation protocol: Yes  Goal Vancomycin trough 15-20 mcg/ml   Plan:  Continue heparin 1250 units/hr Repeat heparin level in 8 hours (to confirm  therapeutic) Daily heparin level and CBC Increase Vancomycin to 1500mg  IV q24h F/u CRRT tolerance, micro data, pt's clinical condition Vanc levels as appropriate  Christoper Fabian, PharmD, BCPS Clinical pharmacist, pager [phone] 06/13/2014 3:16 PM

## 2014-06-14 ENCOUNTER — Telehealth (HOSPITAL_COMMUNITY): Payer: Self-pay | Admitting: Vascular Surgery

## 2014-06-14 LAB — RENAL FUNCTION PANEL
Albumin: 2.9 g/dL — ABNORMAL LOW (ref 3.5–5.2)
Albumin: 2.5 g/dL — ABNORMAL LOW (ref 3.5–5.2)
Anion gap: 15 (ref 5–15)
Anion gap: 12 (ref 5–15)
BUN: 9 mg/dL (ref 6–23)
BUN: 10 mg/dL (ref 6–23)
CALCIUM: 8.9 mg/dL (ref 8.4–10.5)
CALCIUM: 8.1 mg/dL — AB (ref 8.4–10.5)
CO2: 25 meq/L (ref 19–32)
CO2: 24 meq/L (ref 19–32)
CREATININE: 1.48 mg/dL — AB (ref 0.50–1.35)
CREATININE: 1.63 mg/dL — AB (ref 0.50–1.35)
Chloride: 99 mEq/L (ref 96–112)
Chloride: 97 mEq/L (ref 96–112)
GFR calc Af Amer: 56 mL/min — ABNORMAL LOW (ref >90)
GFR calc non Af Amer: 48 mL/min — ABNORMAL LOW (ref >90)
GFR calc non Af Amer: 43 mL/min — ABNORMAL LOW (ref >90)
GFR, EST AFRICAN AMERICAN: 49 mL/min — AB (ref >90)
Glucose, Bld: 119 mg/dL — ABNORMAL HIGH (ref 70–99)
Glucose, Bld: 150 mg/dL — ABNORMAL HIGH (ref 70–99)
Phosphorus: 2.6 mg/dL (ref 2.3–4.6)
Phosphorus: 1.7 mg/dL — ABNORMAL LOW (ref 2.3–4.6)
Potassium: 4.2 mEq/L (ref 3.7–5.3)
Potassium: 3.9 mEq/L (ref 3.7–5.3)
SODIUM: 133 meq/L — AB (ref 137–147)
Sodium: 139 mEq/L (ref 137–147)

## 2014-06-14 LAB — CBC WITH DIFFERENTIAL/PLATELET
Basophils Absolute: 0 10*3/uL (ref 0.0–0.1)
Basophils Relative: 0 % (ref 0–1)
EOS PCT: 2 % (ref 0–5)
Eosinophils Absolute: 0.1 10*3/uL (ref 0.0–0.7)
HCT: 37.1 % — ABNORMAL LOW (ref 39.0–52.0)
Hemoglobin: 11.3 g/dL — ABNORMAL LOW (ref 13.0–17.0)
Lymphocytes Relative: 19 % (ref 12–46)
Lymphs Abs: 1.2 10*3/uL (ref 0.7–4.0)
MCH: 23.6 pg — ABNORMAL LOW (ref 26.0–34.0)
MCHC: 30.5 g/dL (ref 30.0–36.0)
MCV: 77.6 fL — AB (ref 78.0–100.0)
Monocytes Absolute: 0.9 10*3/uL (ref 0.1–1.0)
Monocytes Relative: 15 % — ABNORMAL HIGH (ref 3–12)
Neutro Abs: 4 10*3/uL (ref 1.7–7.7)
Neutrophils Relative %: 64 % (ref 43–77)
Platelets: 139 10*3/uL — ABNORMAL LOW (ref 150–400)
RBC: 4.78 MIL/uL (ref 4.22–5.81)
RDW: 21.3 % — ABNORMAL HIGH (ref 11.5–15.5)
WBC: 6.2 10*3/uL (ref 4.0–10.5)

## 2014-06-14 LAB — POCT ACTIVATED CLOTTING TIME
ACTIVATED CLOTTING TIME: 208 s
ACTIVATED CLOTTING TIME: 208 s
ACTIVATED CLOTTING TIME: 236 s
ACTIVATED CLOTTING TIME: 242 s
ACTIVATED CLOTTING TIME: 242 s
ACTIVATED CLOTTING TIME: 247 s
Activated Clotting Time: 219 seconds
Activated Clotting Time: 230 seconds
Activated Clotting Time: 231 seconds
Activated Clotting Time: 236 seconds
Activated Clotting Time: 242 seconds
Activated Clotting Time: 242 seconds
Activated Clotting Time: 247 seconds
Activated Clotting Time: 202 seconds
Activated Clotting Time: 202 seconds
Activated Clotting Time: 197 seconds

## 2014-06-14 LAB — HEPARIN LEVEL (UNFRACTIONATED)
Heparin Unfractionated: 0.78 IU/mL — ABNORMAL HIGH (ref 0.30–0.70)
Heparin Unfractionated: 0.67 IU/mL (ref 0.30–0.70)
Heparin Unfractionated: 0.38 IU/mL (ref 0.30–0.70)

## 2014-06-14 LAB — GLUCOSE, CAPILLARY
GLUCOSE-CAPILLARY: 124 mg/dL — AB (ref 70–99)
GLUCOSE-CAPILLARY: 98 mg/dL (ref 70–99)
GLUCOSE-CAPILLARY: 147 mg/dL — AB (ref 70–99)
GLUCOSE-CAPILLARY: 193 mg/dL — AB (ref 70–99)
Glucose-Capillary: 147 mg/dL — ABNORMAL HIGH (ref 70–99)
Glucose-Capillary: 158 mg/dL — ABNORMAL HIGH (ref 70–99)

## 2014-06-14 LAB — PROTIME-INR
INR: 1.67 — AB (ref 0.00–1.49)
INR: 1.57 — AB (ref 0.00–1.49)
PROTHROMBIN TIME: 18.9 s — AB (ref 11.6–15.2)
Prothrombin Time: 19.8 seconds — ABNORMAL HIGH (ref 11.6–15.2)

## 2014-06-14 LAB — BASIC METABOLIC PANEL
Anion gap: 13 (ref 5–15)
BUN: 9 mg/dL (ref 6–23)
CALCIUM: 8.8 mg/dL (ref 8.4–10.5)
CO2: 26 mEq/L (ref 19–32)
CREATININE: 1.51 mg/dL — AB (ref 0.50–1.35)
Chloride: 99 mEq/L (ref 96–112)
GFR calc Af Amer: 54 mL/min — ABNORMAL LOW (ref >90)
GFR calc non Af Amer: 47 mL/min — ABNORMAL LOW (ref >90)
Glucose, Bld: 121 mg/dL — ABNORMAL HIGH (ref 70–99)
Potassium: 4.1 mEq/L (ref 3.7–5.3)
Sodium: 138 mEq/L (ref 137–147)

## 2014-06-14 LAB — MAGNESIUM: MAGNESIUM: 2.7 mg/dL — AB (ref 1.5–2.5)

## 2014-06-14 LAB — APTT: aPTT: 186 seconds — ABNORMAL HIGH (ref 24–37)

## 2014-06-14 MED ORDER — ALUM & MAG HYDROXIDE-SIMETH 200-200-20 MG/5ML PO SUSP
30.0000 mL | Freq: Once | ORAL | Status: AC
  Administered 2014-06-14: 30 mL via ORAL
  Filled 2014-06-14: qty 30

## 2014-06-14 NOTE — Progress Notes (Signed)
Patient ID: [name], male   DOB: March 27, 1951, 63 y.o.   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 Maintained on dobutamine 66mcg/kg/min.   Past medical h/o is notable for CKD with baseline Cr 3.0, cocaine abuse, DM2, AF on amio, VT. He has not been candidate for advanced therapies due to cocaine abuse (quit 4 months ago) and renal failure. Started on dobutamine. Moved to [place] to try to stay off cocaine.   On Oct 14 seen at [facility]. CR was 3.3 (on 04/16/2014 Cr was 1.6 at [facility] on discharge) and potassium was low. Kcl was increased. Over past week increased dyspnea and edema. NYHA IIIB symptoms at baseline.   10/30 Admitted with rep failure with wide complex bradycardia. K 6.8 and Cr 4.6. Intubated and CVVHD started.  Extubated 11/1.  Continues on CVVHD.  Bcx + for bacillus. ID requesting line holiday.  Feels fine. Weight down another pound. (17 pounds total)  Dobutamine increased today due to SPB in 70s.  CVP 15  U/o < 10cc/hr.  Renal wondering if he can be maintained off dobutamine.   Objective:   Weight Range:  Vital Signs:   Temp:  [97.7 F (36.5 C)-99.3 F (37.4 C)] 99 F (37.2 C) (11/05 1250) Pulse Rate:  [85-96] 93 (11/05 1250) Resp:  [8-24] 11 (11/05 1250) BP: (77-118)/(48-76) 88/58 mmHg (11/05 1250) SpO2:  [83 %-100 %] 95 % (11/05 1250) Weight:  [106.4 kg (234 lb 9.1 oz)] 106.4 kg (234 lb 9.1 oz) (11/05 0500) Last BM Date: 06/13/14  Weight change: Filed Weights   06/12/14 0500 06/13/14 0500 06/14/14 0500  Weight: 108.5 kg (239 lb 3.2 oz) 106.8 kg (235 lb 7.2 oz) 106.4 kg (234 lb 9.1 oz)    Intake/Output:   Intake/Output Summary (Last 24 hours) at 06/14/14 1306 Last data filed at 06/14/14 1200  Gross per 24 hour  Intake 933.47 ml  Output   4567 ml  Net -3633.53 ml     Physical Exam: CVP 15 General: NAD  Awake in bed. Bair hugger in  place HEENT: normal  Neck: supple. JVP to ear.  Carotids 2+ bilat; no bruits. No lymphadenopathy or thryomegaly appreciated.  Cor: PMI laterally displaced. Regular rate & rhythm. 2/6 MR +s3  Lungs: + clear Abdomen: soft, nontender, + distended. No hepatosplenomegaly. No bruits or masses. Good bowel sounds.  Extremities: no cyanosis, clubbing, rash, 1+ edema R and LLE to thighs  SCDs femoral trialysis catheter Neuro: alert & orientedx3, cranial nerves grossly intact. moves all 4 extremities w/o difficulty. Affect pleasant   Telemetry: SR 90s  Labs: Basic Metabolic Panel:  Recent Labs Lab 06/11/14 0204 06/11/14 0415  06/12/14 0450 06/12/14 1536 06/13/14 0449 06/13/14 1515 06/14/14 0551  NA  --  135*  < > 134* 134* 139 134* 139  138  K  --  4.4  < > 4.4 3.7 3.6* 4.2 4.2  4.1  CL  --  96  < > 99 98 100 97 99  99  CO2  --  25  < > 21 26 26 21 25  26   GLUCOSE  --  129*  < > 143* 121* 105* 135* 119*  121*  BUN  --  20  < > 15 10 10 10 9  9   CREATININE  --  1.49*  < > 1.47* 1.08 1.53* 1.48* 1.48*  1.51*  CALCIUM  --  8.6  < > 8.3* 8.1* 8.4 8.1* 8.9  8.8  MG 2.4 2.4  --  2.6*  --  2.5  --  2.7*  PHOS  --  2.2*  < > 1.5* 1.0* 2.6 2.7 2.6  < > = values in this interval not displayed.  Liver Function Tests:  Recent Labs Lab 06/08/14 1040 06/08/14 1216  06/09/14 0358 06/10/14 0430  06/11/14 0415  06/12/14 0450 06/12/14 1536 06/13/14 0449 06/13/14 1515 06/14/14 0551  AST 85* 98*  --  492* 417*  --  360*  --   --   --   --   --   --   ALT 66* 76*  --  282* 363*  --  380*  --   --   --   --   --   --   ALKPHOS 95 94  --  77 95  --  87  --   --   --   --   --   --   BILITOT 3.8* 3.9*  --  2.8* 3.2*  --  3.0*  --   --   --   --   --   --   PROT 8.6* 8.4*  --  7.1 8.8*  --  7.9  --   --   --   --   --   --   ALBUMIN 3.4* 3.3*  < > 3.0* 3.4*  < > 3.0*  < > 2.7* 2.6* 2.6* 2.7* 2.9*  < > = values in this interval not displayed. No results for input(s): LIPASE, AMYLASE in  the last 168 hours. No results for input(s): AMMONIA in the last 168 hours.  CBC:  Recent Labs Lab 06/08/14 1216  06/10/14 0430 06/11/14 0415 06/12/14 0450 06/13/14 0449 06/14/14 0551  WBC 7.3  < > 11.5* 10.9* 7.4 6.7 6.2  NEUTROABS 6.2  --   --   --  5.3 4.4 4.0  HGB 12.0*  < > 11.3* 10.9* 11.0* 10.0* 11.3*  HCT 38.6*  < > 37.0* 35.9* 35.0* 32.5* 37.1*  MCV 78.8  < > 79.7 80.7 78.1 77.4* 77.6*  PLT 178  < > 195 153 134* 136* 139*  < > = values in this interval not displayed.  Cardiac Enzymes:  Recent Labs Lab 06/08/14 1040  TROPONINI <0.30    BNP: BNP (last 3 results)  Recent Labs  06/08/14 1040 06/12/14 0450  PROBNP 11335.0* 6716.0*     Other results:    Imaging: No results found.   Medications:     Scheduled Medications: . amiodarone  200 mg Oral Daily  . antiseptic oral rinse  7 mL Mouth Rinse BID  . calcitRIOL  0.25 mcg Oral Daily  . feeding supplement (NEPRO CARB STEADY)  237 mL Oral BID BM  . insulin aspart  2-6 Units Subcutaneous 6 times per day  . pantoprazole  40 mg Oral Daily  . vancomycin  1,500 mg Intravenous Q24H    Infusions: . DOBUTamine 6 mcg/kg/min (06/13/14 1226)  . heparin 10,000 units/ 20 mL infusion syringe 550 Units/hr (06/14/14 1246)  . heparin 1,050 Units/hr (06/14/14 0750)  . dialysis replacement fluid (prismasate) 800 mL/hr at 06/14/14 1027  . dialysis replacement fluid (prismasate) 700 mL/hr at 06/13/14 1504  . dialysate (PRISMASATE) 2,000 mL/hr at 06/14/14 1130    PRN Medications: heparin, heparin, sodium chloride   Assessment:   1. Acute respiratory failure  2. A/c systolic HF due to  NICM EF < 15%  3. Acute on chronic renal failure stage IV  4. Hyperkalemia  5. Cardiogenic shock  6. DM2  7. Cocaine abuse in remission 3-4 months  8. H/o gout  9. Supratherapeutic INR  10. LV thrombus  11. PAF on amio  12. H/o VT 13. Fever 11/1   Plan/Discussion:    Volume status improving. CVVHD rate limited by  low BP. CVP remains 15. Essentially anuric. Continue CVVHD.  Weaning dobutamine is certainly a consideration but unfortunately I don't think he will tolerate it for long. In June of this year he had shock as well and required IABP and required dobutamine subsequently. Currently requiring very high dose dobutamine (now at 7.5 mcg/kg/min) to support CVVHD,  At this point his only hope for survival is for his kidneys to recover enough to maintain his volume status without HD support. Thus would continue CVVHD until nearly all fluid is removed. Once fluid is removed then I think we can remove central lines and give dobutamine peripherally in order to give him a line holiday and check surveillance cultures.   If kidneys don't recover, we can consider trying to wean dobutamine and try iHD but I am a pessimistic that this will be successful and Comfort Care may be only option.    The patient is critically ill with multiple organ systems failure and requires high complexity decision making for assessment and support, frequent evaluation and titration of therapies, application of advanced monitoring technologies and extensive interpretation of multiple databases.   Critical Care Time devoted to patient care services described in this note is 35 Minutes.   Kortnee Bas,MD 1:06 PM

## 2014-06-14 NOTE — Progress Notes (Signed)
[facility]      Subjective: Feels better today   Antibiotics:  Anti-infectives    Start     Dose/Rate Route Frequency Ordered Stop   06/14/14 0800  vancomycin (VANCOCIN) 1,500 mg in sodium chloride 0.9 % 250 mL IVPB     1,500 mg125 mL/hr over 120 Minutes Intravenous Every 24 hours 06/13/14 1528     06/10/14 1000  piperacillin-tazobactam (ZOSYN) IVPB 3.375 g  Status:  Discontinued     3.375 g100 mL/hr over 30 Minutes Intravenous Every 6 hours 06/10/14 0918 06/12/14 1318   06/10/14 1000  vancomycin (VANCOCIN) 1,250 mg in sodium chloride 0.9 % 250 mL IVPB  Status:  Discontinued     1,250 mg166.7 mL/hr over 90 Minutes Intravenous Every 24 hours 06/10/14 0918 06/13/14 1528      Medications: Scheduled Meds: . amiodarone  200 mg Oral Daily  . antiseptic oral rinse  7 mL Mouth Rinse BID  . calcitRIOL  0.25 mcg Oral Daily  . feeding supplement (NEPRO CARB STEADY)  237 mL Oral BID BM  . insulin aspart  2-6 Units Subcutaneous 6 times per day  . pantoprazole  40 mg Oral Daily  . vancomycin  1,500 mg Intravenous Q24H   Continuous Infusions: . DOBUTamine 6 mcg/kg/min (06/13/14 1226)  . heparin 10,000 units/ 20 mL infusion syringe 550 Units/hr (06/14/14 1246)  . heparin 1,050 Units/hr (06/14/14 0750)  . dialysis replacement fluid (prismasate) 800 mL/hr at 06/14/14 1027  . dialysis replacement fluid (prismasate) 700 mL/hr at 06/13/14 1504  . dialysate (PRISMASATE) 2,000 mL/hr at 06/14/14 1410   PRN Meds:.heparin, heparin, sodium chloride    Objective: Weight change: -14.1 oz (-0.4 kg)  Intake/Output Summary (Last 24 hours) at 06/14/14 1428 Last data filed at 06/14/14 1400  Gross per 24 hour  Intake 958.17 ml  Output   4579 ml  Net -3620.83 ml   Blood pressure 102/71, pulse 92, temperature 99.4 F (37.4 C), temperature source Core (Comment), resp. rate 20, height 6' (1.829 m), weight 234 lb 9.1 oz (106.4 kg), SpO2 100 %. Temp:  [97.7 F (36.5  C)-99.4 F (37.4 C)] 99.4 F (37.4 C) (11/05 1400) Pulse Rate:  [85-96] 92 (11/05 1400) Resp:  [8-24] 20 (11/05 1400) BP: (77-102)/(47-76) 102/71 mmHg (11/05 1400) SpO2:  [83 %-100 %] 100 % (11/05 1400) Weight:  [234 lb 9.1 oz (106.4 kg)] 234 lb 9.1 oz (106.4 kg) (11/05 0500)  Physical Exam: General: Alert and awake, oriented x3, under a cooling blanket HEENT: anicteric sclera,  EOMI, oropharynx clear and without exudate CVS tachycardic raterate, 2/6 systolic murmur at PMI Chest: clear to auscultation bilaterally anteriorly Abdomen: soft nontender,distended, normal bowel sounds, Extremities: 2+ edema noted bilaterally Neuro: nonfocal, strength and sensation intact Skin: PICC line intact dialysis line in place  CBC:  CBC Latest Ref Rng 06/14/2014 06/13/2014 06/12/2014  WBC 4.0 - 10.5 K/uL 6.2 6.7 7.4  Hemoglobin 13.0 - 17.0 g/dL 11.3(L) 10.0(L) 11.0(L)  Hematocrit 39.0 - 52.0 % 37.1(L) 32.5(L) 35.0(L)  Platelets 150 - 400 K/uL 139(L) 136(L) 134(L)      BMET  Recent Labs  06/13/14 1515 06/14/14 0551  NA 134* 139  138  K 4.2 4.2  4.1  CL 97 99  99  CO2 21 25  26   GLUCOSE 135* 119*  121*  BUN 10 9  9   CREATININE 1.48* 1.48*  1.51*  CALCIUM 8.1* 8.9  8.8     Liver Panel   Recent Labs  06/13/14 1515 06/14/14 0551  ALBUMIN 2.7* 2.9*       Sedimentation Rate No results for input(s): ESRSEDRATE in the last 72 hours. C-Reactive Protein No results for input(s): CRP in the last 72 hours.  Micro Results: Recent Results (from the past 720 hour(s))  MRSA PCR Screening     Status: None   Collection Time: 06/08/14  1:16 PM  Result Value Ref Range Status   MRSA by PCR NEGATIVE NEGATIVE Final    Comment:        The GeneXpert MRSA Assay (FDA approved for NASAL specimens only), is one component of a comprehensive MRSA colonization surveillance program. It is not intended to diagnose MRSA infection nor to guide or monitor treatment for MRSA infections.   Urine culture     Status: None   Collection Time: 06/09/14 10:32 AM  Result Value Ref Range Status   Specimen Description URINE, CATHETERIZED  Final   Special Requests Immunocompromised  Final   Culture  Setup Time   Final    06/09/2014 20:50 Performed at [facility]    Colony Count NO GROWTH Performed at [facility]   Final   Culture NO GROWTH Performed at [facility]   Final   Report Status 06/10/2014 FINAL  Final  Culture, blood (routine x 2)     Status: None   Collection Time: 06/10/14  3:39 AM  Result Value Ref Range Status   Specimen Description BLOOD LEFT HAND  Final   Special Requests BOTTLES DRAWN AEROBIC ONLY 4CC  Final   Culture  Setup Time   Final    06/10/2014 17:58 Performed at [facility]    Culture   Final    BACILLUS SPECIES Note: Standardized susceptibility testing for this organism is not available. Note: Gram Stain Report Called to,Read Back By and Verified With: BRYCE SHEPARD @ 3:58AM 11.2.15 BY DESIS Performed at [facility]    Report Status 06/12/2014 FINAL  Final  Culture, blood (routine x 2)     Status: None   Collection Time: 06/10/14  3:52 AM  Result Value Ref Range Status   Specimen Description BLOOD LEFT HAND  Final   Special Requests BOTTLES DRAWN AEROBIC ONLY 8CC  Final   Culture  Setup Time   Final    06/10/2014 17:58 Performed at [facility]    Culture   Final    BACILLUS SPECIES Note: Standardized susceptibility testing for this organism is not available. Note: Gram Stain Report Called to,Read Back By and Verified With: BRYCE SHEPARD @ 3:58AM 11.2.15 BY DESIS Culture results may be compromised due to an excessive volume of blood received in culture bottles. Performed at [facility]    Report Status 06/12/2014 FINAL  Final  Culture, blood (routine x 2)     Status: None (Preliminary result)   Collection Time: 06/12/14  1:54 PM  Result Value Ref Range Status   Specimen  Description BLOOD LEFT HAND  Final   Special Requests   Final    BOTTLES DRAWN AEROBIC AND ANAEROBIC BLUE 10CC RED 5CC   Culture  Setup Time   Final    06/12/2014 20:35 Performed at [facility]    Culture   Final           BLOOD CULTURE RECEIVED NO GROWTH TO DATE CULTURE WILL BE HELD FOR 5 DAYS BEFORE ISSUING A FINAL NEGATIVE REPORT Performed at [facility]    Report Status PENDING  Incomplete  Culture, blood (routine x 2)     Status: None (Preliminary result)   Collection Time: 06/12/14  2:05 PM  Result Value Ref Range Status   Specimen Description BLOOD LEFT ARM  Final   Special Requests BOTTLES DRAWN AEROBIC AND ANAEROBIC 10CC  Final   Culture  Setup Time   Final    06/12/2014 20:35 Performed at [facility]    Culture   Final           BLOOD CULTURE RECEIVED NO GROWTH TO DATE CULTURE WILL BE HELD FOR 5 DAYS BEFORE ISSUING A FINAL NEGATIVE REPORT Performed at [facility]    Report Status PENDING  Incomplete    Studies/Results: No results found.    Assessment/Plan:  Active Problems:   Acute respiratory failure   Cardiogenic shock   Acute on chronic systolic congestive heart failure   Acute on chronic renal failure   Hyperkalemia   Respiratory failure   Bacteremia associated with intravascular line   Blood poisoning   Acute respiratory failure with hypoxia   AKI (acute kidney injury)   Screen for STD (sexually transmitted disease)   Pulmonary edema    [name] is a 63 y.o. male with PICC line at home with dobutamine infusion now with admission for CHF, renal failure, hyperkalemia, bradycardia, fevers and found to be bacteremic with Bacillus (likely cereus)  #1 Bacillus Cereus bacteremia: likely due to his improper non sterile manipulation of his line when he was changing infusion site. 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  Lab does not speciate beyond saying this non anthrax Bacillus and an aerobe, no sensis available  -- narrowed to vancomycin, given resistance to penicillin found with this organism' -- Cardiology and Nephrology to try to get all lines out once sufficient volume is removed via HD --COULD CONSIDER TEE TO LOOK FOR ENDOCARDITIS  Dr. Luciana Axe is covering for me tomorrow and Dr. Orvan Falconer will be here this weekend. I will be back on Monday.    LOS: 6 days   Acey Lav 06/14/2014, 2:28 PM

## 2014-06-14 NOTE — Progress Notes (Signed)
ANTICOAGULATION CONSULT NOTE - Follow Up Consult  Pharmacy Consult for Heparin  Indication: atrial fibrillation and and apical thrombus  No Known Allergies  Patient Measurements: Height: 6' (182.9 cm) Weight: 234 lb 9.1 oz (106.4 kg) IBW/kg (Calculated) : 77.6 Heparin Dosing Weight: 92 kg  Vital Signs: Temp: 97.9 F (36.6 C) (11/05 0605) Temp Source: Core (Comment) (11/05 0400) BP: 93/68 mmHg (11/05 0605) Pulse Rate: 96 (11/05 0605)  Labs:  Recent Labs  06/12/14 0450  06/12/14 1646 06/13/14 0449 06/13/14 1400 06/13/14 1515 06/14/14 0551  HGB 11.0*  --   --  10.0*  --   --  11.3*  HCT 35.0*  --   --  32.5*  --   --  37.1*  PLT 134*  --   --  136*  --   --  139*  APTT 72*  --   --  71*  --   --   --   LABPROT 27.4*  --  24.6* 22.2*  --   --  19.8*  INR 2.52*  --  2.20* 1.93*  --   --  1.67*  HEPARINUNFRC  --   --   --   --  0.57  --  0.78*  CREATININE 1.47*  < >  --  1.53*  --  1.48* 1.48*  1.51*  < > = values in this interval not displayed.  Estimated Creatinine Clearance: 62.7 mL/min (by C-G formula based on Cr of 1.48).  Assessment: AC: 63 year old male with h/o  atrial fibrillation and apical thrombus. Coumadin on hold, INR down to 1.67.  Heparin level 0.78 - supra- therapeutic. No bleeding noted. Patient on CRRT receiving heparin.   Goal of Therapy:  Heparin level 0.3-0.7 Monitor platelets by anticoagulation protocol: Yes    Plan:  Decrease heparin to 1050 units/hr.  Repeat heparin level in 8 hours (to confirm therapeutic) Daily heparin level and CBC  Link Snuffer, PharmD, BCPS Clinical Pharmacist [phone]  06/14/2014 7:47 AM

## 2014-06-14 NOTE — Progress Notes (Signed)
ANTICOAGULATION CONSULT NOTE - Follow Up Consult  Pharmacy Consult for Heparin  Indication: atrial fibrillation and and apical thrombus  No Known Allergies  Patient Measurements: Height: 6' (182.9 cm) Weight: 234 lb 9.1 oz (106.4 kg) IBW/kg (Calculated) : 77.6 Heparin Dosing Weight: 92 kg  Vital Signs: Temp: 99.6 F (37.6 C) (11/05 1730) Temp Source: Core (Comment) (11/05 1630) BP: 96/64 mmHg (11/05 1715) Pulse Rate: 95 (11/05 1730)  Labs:  Recent Labs  06/12/14 0450  06/13/14 0449  06/13/14 1515 06/14/14 0551 06/14/14 0925 06/14/14 1600  HGB 11.0*  --  10.0*  --   --  11.3*  --   --   HCT 35.0*  --  32.5*  --   --  37.1*  --   --   PLT 134*  --  136*  --   --  139*  --   --   APTT 72*  --  71*  --   --   --  186*  --   LABPROT 27.4*  < > 22.2*  --   --  19.8* 18.9*  --   INR 2.52*  < > 1.93*  --   --  1.67* 1.57*  --   HEPARINUNFRC  --   --   --   < >  --  0.78* 0.67 0.38  CREATININE 1.47*  < > 1.53*  --  1.48* 1.48*  1.51*  --  1.63*  < > = values in this interval not displayed.  Estimated Creatinine Clearance: 56.9 mL/min (by C-G formula based on Cr of 1.63).  Assessment: 62 year old male with h/o  atrial fibrillation and apical thrombus. Coumadin on hold, INR down to 1.67.  Heparin level now 0.38 after dose reduction this morning. No bleeding noted. Patient on CRRT receiving heparin.   Goal of Therapy:  Heparin level 0.3-0.7 Monitor platelets by anticoagulation protocol: Yes    Plan:  Continue heparin at 1050 units/hr.  Repeat heparin level at 2300 to ensure no more accumulation occurs Daily heparin level and CBC  [name], PharmD, BCPS Clinical Pharmacist Pager: [phone] 06/14/2014 5:37 PM

## 2014-06-14 NOTE — Progress Notes (Signed)
[facility] Rounding Note  Subjective: Continues to be without complaint. Denies CP or SOB.   Objective: Vital signs in last 24 hours: Temp:  [97.7 F (36.5 C)-99.9 F (37.7 C)] 97.9 F (36.6 C) (11/05 0605) Pulse Rate:  [85-104] 96 (11/05 0605) Resp:  [8-24] 19 (11/05 0605) BP: (86-118)/(49-74) 93/68 mmHg (11/05 0605) SpO2:  [83 %-100 %] 100 % (11/05 0605) Weight:  [234 lb 9.1 oz (106.4 kg)] 234 lb 9.1 oz (106.4 kg) (11/05 0500) Weight change: -14.1 oz (-0.4 kg)  Intake/Output from previous day: 11/04 0701 - 11/05 0700 In: 970.4 [I.V.:499.4; IV Piggyback:266] Out: 4420 [Urine:75]  Down -3.4 L in last 24 hrs; Net - 10.3 L since admit  Weight Trending 10/30  112.4 kg 10/31  114.1 kg 11/1    113.5 kg 11/2    111.2 kg     11/3     108.5 kg   11/4     106.8 kg   11/5     106.4 kg   Physical Examination: BP 93/68 mmHg  Pulse 96  Temp(Src) 97.9 F (36.6 C) (Core (Comment))  Resp 19  Ht 6' (1.829 m)  Wt 234 lb 9.1 oz (106.4 kg)  BMI 31.81 kg/m2  SpO2 100%  CVP = 9  Awake; A&Ox3  VS as above RRR; S1S2 + s3 w/ 2/6 systolic murmur Lungs CTAB  2+ edema bilat notable at posterior thigh PICC in right arm, trialysis catheter in right groin (10/30) Foley with minimal muddy brown urine  Recent Labs  06/13/14 0449 06/14/14 0551  WBC 6.7 6.2  HGB 10.0* 11.3*  HCT 32.5* 37.1*  PLT 136* 139*    Recent Labs  06/13/14 1515 06/14/14 0551  NA 134* 139  138  K 4.2 4.2  4.1  CL 97 99  99  CO2 21 25  26   GLUCOSE 135* 119*  121*  BUN 10 9  9   CREATININE 1.48* 1.48*  1.51*  CALCIUM 8.1* 8.9  8.8  Phosphorus                         2.6 Mag                                      2.7  Results for [name], [name] (MRN [id number]) as of 06/11/2014 07:18  Ref. Range 06/09/2014 07:30  Iron Latest Range: 42-135 ug/dL 21 (L)  UIBC Latest Range: 125-400 ug/dL 119320  TIBC Latest Range: 215-435 ug/dL 147341  Saturation Ratios Latest Range: 20-55 % 6 (L)     Studies/Results: No results found. Scheduled Medications . amiodarone  200 mg Oral Daily  . antiseptic oral rinse  7 mL Mouth Rinse BID  . calcitRIOL  0.25 mcg Oral Daily  . feeding supplement (NEPRO CARB STEADY)  237 mL Oral BID BM  . insulin aspart  2-6 Units Subcutaneous 6 times per day  . pantoprazole  40 mg Oral Daily  . vancomycin  1,500 mg Intravenous Q24H   Infusion/other Medications . DOBUTamine 6 mcg/kg/min (06/13/14 1226)  . heparin 10,000 units/ 20 mL infusion syringe 550 Units/hr (06/14/14 0603)  . heparin 1,250 Units/hr (06/13/14 [id number])  . dialysis replacement fluid (prismasate) 800 mL/hr at 06/13/14 2222  . dialysis replacement fluid (prismasate) 700 mL/hr at 06/13/14 1504  . 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   He was admitted 10/30 with respiratory failure, worsening heart failure, creatinine of 4.6, hyperkalemia (6.8) and wide complex bradycardia. CRRT initiated 10/30 for volume control, renal failure. Has BC+ 11/1 for Bacillus on Vanc (ID following). He is not considered a candidate for outpt HD given need for chronic IV inotropes.    Assessment/Plan:  AKI on CKD4  Remains oliguric. Continue CRRT 4K bath; Rate neg 100 to 150 hour  to facilitate fluid removal (Discussed with Dr Gala Romney) as his vitals are currently stable and hold off on Catheter Holiday until fluid status has improved. Plan for Catheter holiday when we attempt IHD and see if renal fcn will improve. Heparin. Unclear how much recovery he will get. CVP at 9 and volume status improving. Pt is not candidate for heart transplant or pump therapy. Would be very poor candidate for outpt HD given need for dobutamine for past several months.  If unable to recover renal fcn will need to discuss palliative options.  Dr Kittie Plater to decide when volume status is improved sufficiently to try off CRRT. Question whether the Dobutamine could be stopped once dry weight is achieved and be able to tolerate IHD?   CM with acute on chronic systolic heart failure/NICM  EF < 15% - on dobutamine. Per cardiology.   Hypophosphatemia- Resolved. 1.0 on 11/3. 2.6 today. Continue to monitor  H/O VT  PAF: INR 1.93 (11/4) IV heparin per Pharm  Anemia stable. Fe deficient but withhold IV iron in setting of bacteremia  PTH: Elevated at 168: Calcitriol (0.25 mcg) PO qd  Bacillus bacteremia (11/1). ID following. On vanco (11/1>>); zosyn stopped (11/1>>11/3)  Needs Catheter Holiday - Will continue with CRRT for fluid removal as he is currently stable and plan to remove line when we attempt to transition to IHD  Cocaine abuse - none for 4 months     LOS: 6 days   Wenda Low 06/14/2014,8:15 AM   I have seen and examined this patient and agree with plan and assessment in the above note. Fluid continues to come down though still with pitting edema to the thighs.  CVP down to 9.  Has more volume to go.  My question for Dr. Teressa Lower: could he be managed from a heart failure standpoint without IV dobutamine if he could be kept dry with dialysis - the reason I ask is that if he could tolerate IHD (without the help of dobutamine) then "long term" dialysis might still be an option for him....  Lyah Millirons B,MD 06/14/2014 9:21 AM

## 2014-06-15 LAB — RENAL FUNCTION PANEL
Albumin: 2.7 g/dL — ABNORMAL LOW (ref 3.5–5.2)
Albumin: 2.6 g/dL — ABNORMAL LOW (ref 3.5–5.2)
Anion gap: 13 (ref 5–15)
Anion gap: 11 (ref 5–15)
BUN: 9 mg/dL (ref 6–23)
BUN: 7 mg/dL (ref 6–23)
CALCIUM: 8.7 mg/dL (ref 8.4–10.5)
CO2: 25 mEq/L (ref 19–32)
CO2: 26 meq/L (ref 19–32)
Calcium: 8.1 mg/dL — ABNORMAL LOW (ref 8.4–10.5)
Chloride: 99 mEq/L (ref 96–112)
Chloride: 97 meq/L (ref 96–112)
Creatinine, Ser: 1.69 mg/dL — ABNORMAL HIGH (ref 0.50–1.35)
Creatinine, Ser: 1.36 mg/dL — ABNORMAL HIGH (ref 0.50–1.35)
GFR calc Af Amer: 47 mL/min — ABNORMAL LOW (ref >90)
GFR calc Af Amer: 61 mL/min — ABNORMAL LOW
GFR calc non Af Amer: 41 mL/min — ABNORMAL LOW (ref >90)
GFR calc non Af Amer: 53 mL/min — ABNORMAL LOW
Glucose, Bld: 131 mg/dL — ABNORMAL HIGH (ref 70–99)
Glucose, Bld: 190 mg/dL — ABNORMAL HIGH (ref 70–99)
PHOSPHORUS: 1.8 mg/dL — AB (ref 2.3–4.6)
Phosphorus: 1.6 mg/dL — ABNORMAL LOW (ref 2.3–4.6)
Potassium: 4.3 mEq/L (ref 3.7–5.3)
Potassium: 4.1 meq/L (ref 3.7–5.3)
Sodium: 137 mEq/L (ref 137–147)
Sodium: 134 meq/L — ABNORMAL LOW (ref 137–147)

## 2014-06-15 LAB — CBC WITH DIFFERENTIAL/PLATELET
BASOS ABS: 0 10*3/uL (ref 0.0–0.1)
BASOS PCT: 0 % (ref 0–1)
EOS ABS: 0.1 10*3/uL (ref 0.0–0.7)
Eosinophils Relative: 1 % (ref 0–5)
HCT: 35 % — ABNORMAL LOW (ref 39.0–52.0)
HEMOGLOBIN: 11 g/dL — AB (ref 13.0–17.0)
LYMPHS PCT: 18 % (ref 12–46)
Lymphs Abs: 1.2 10*3/uL (ref 0.7–4.0)
MCH: 24.1 pg — AB (ref 26.0–34.0)
MCHC: 31.4 g/dL (ref 30.0–36.0)
MCV: 76.8 fL — ABNORMAL LOW (ref 78.0–100.0)
MONO ABS: 1 10*3/uL (ref 0.1–1.0)
Monocytes Relative: 14 % — ABNORMAL HIGH (ref 3–12)
NEUTROS ABS: 4.5 10*3/uL (ref 1.7–7.7)
Neutrophils Relative %: 67 % (ref 43–77)
Platelets: 134 10*3/uL — ABNORMAL LOW (ref 150–400)
RBC: 4.56 MIL/uL (ref 4.22–5.81)
RDW: 21.3 % — AB (ref 11.5–15.5)
WBC: 6.8 10*3/uL (ref 4.0–10.5)

## 2014-06-15 LAB — GLUCOSE, CAPILLARY
Glucose-Capillary: 173 mg/dL — ABNORMAL HIGH (ref 70–99)
Glucose-Capillary: 191 mg/dL — ABNORMAL HIGH (ref 70–99)
Glucose-Capillary: 92 mg/dL (ref 70–99)
Glucose-Capillary: 158 mg/dL — ABNORMAL HIGH (ref 70–99)
Glucose-Capillary: 187 mg/dL — ABNORMAL HIGH (ref 70–99)
Glucose-Capillary: 108 mg/dL — ABNORMAL HIGH (ref 70–99)

## 2014-06-15 LAB — PROTIME-INR
INR: 1.55 — ABNORMAL HIGH (ref 0.00–1.49)
Prothrombin Time: 18.7 s — ABNORMAL HIGH (ref 11.6–15.2)

## 2014-06-15 LAB — POCT ACTIVATED CLOTTING TIME
ACTIVATED CLOTTING TIME: 191 s
Activated Clotting Time: 191 s
Activated Clotting Time: 191 seconds
Activated Clotting Time: 197 s
Activated Clotting Time: 197 s
Activated Clotting Time: 191 seconds
Activated Clotting Time: 185 s
Activated Clotting Time: 191 seconds

## 2014-06-15 LAB — HEPARIN LEVEL (UNFRACTIONATED)
Heparin Unfractionated: 0.45 IU/mL (ref 0.30–0.70)
Heparin Unfractionated: 0.4 IU/mL (ref 0.30–0.70)

## 2014-06-15 LAB — APTT: aPTT: 103 seconds — ABNORMAL HIGH (ref 24–37)

## 2014-06-15 LAB — MAGNESIUM: Magnesium: 2.8 mg/dL — ABNORMAL HIGH (ref 1.5–2.5)

## 2014-06-15 MED ORDER — SODIUM PHOSPHATE 3 MMOLE/ML IV SOLN
10.0000 mmol | Freq: Two times a day (BID) | INTRAVENOUS | Status: DC
  Administered 2014-06-15 – 2014-06-19 (×10): 10 mmol via INTRAVENOUS
  Filled 2014-06-15 (×13): qty 3.33

## 2014-06-15 MED ORDER — DOBUTAMINE IN D5W 4-5 MG/ML-% IV SOLN
2.5000 ug/kg/min | INTRAVENOUS | Status: DC
  Administered 2014-06-16: 7 ug/kg/min via INTRAVENOUS
  Administered 2014-06-17 – 2014-06-20 (×3): 5 ug/kg/min via INTRAVENOUS
  Administered 2014-06-21: 1.5 ug/kg/min via INTRAVENOUS
  Administered 2014-06-21: 2.5 ug/kg/min via INTRAVENOUS
  Filled 2014-06-15 (×5): qty 250

## 2014-06-15 NOTE — Progress Notes (Signed)
Patient ID: [name], male   DOB: 04/21/1951, 63 y.o.   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 Maintained on dobutamine 44mcg/kg/min.   Past medical h/o is notable for CKD with baseline Cr 3.0, cocaine abuse, DM2, AF on amio, VT. He has not been candidate for advanced therapies due to cocaine abuse (quit 4 months ago) and renal failure. Started on dobutamine. Moved to [place] to try to stay off cocaine.   On Oct 14 seen at [facility]. CR was 3.3 (on 04/16/2014 Cr was 1.6 at [facility] on discharge) and potassium was low. Kcl was increased. Over past week increased dyspnea and edema. NYHA IIIB symptoms at baseline.   10/30 Admitted with rep failure with wide complex bradycardia. K 6.8 and Cr 4.6. Intubated and CVVHD started.  Extubated 11/1.  Continues on CVVHD.  Bcx + for bacillus. ID requesting line holiday.  Feels fine. Weight recorded as 10 pounds down (?) However CVP up to 20 - checked personally.  Dobutamine increased to 7.5  SPB in 80-90s.  U/o < 10cc/hr.  Renal wondering if he can be maintained off dobutamine.   Objective:   Weight Range:  Vital Signs:   Temp:  [98.1 F (36.7 C)-99.9 F (37.7 C)] 98.6 F (37 C) (11/06 0600) Pulse Rate:  [87-100] 96 (11/06 0600) Resp:  [9-25] 20 (11/06 0600) BP: (66-102)/(31-76) 97/73 mmHg (11/06 0600) SpO2:  [92 %-100 %] 93 % (11/06 0600) Weight:  [102.4 kg (225 lb 12 oz)] 102.4 kg (225 lb 12 oz) (11/06 0500) Last BM Date: 06/13/14  Weight change: Filed Weights   06/13/14 0500 06/14/14 0500 06/15/14 0500  Weight: 106.8 kg (235 lb 7.2 oz) 106.4 kg (234 lb 9.1 oz) 102.4 kg (225 lb 12 oz)    Intake/Output:   Intake/Output Summary (Last 24 hours) at 06/15/14 0659 Last data filed at 06/15/14 0600  Gross per 24 hour  Intake 958.31 ml  Output   3803 ml  Net -2844.69 ml     Physical Exam: CVP 20 General: NAD  Awake in  bed. Bair hugger in place HEENT: normal  Neck: supple. JVP to ear.  Carotids 2+ bilat; no bruits. No lymphadenopathy or thryomegaly appreciated.  Cor: PMI laterally displaced. Regular rate & rhythm. 2/6 MR +s3  Lungs: + clear Abdomen: soft, nontender, + distended. No hepatosplenomegaly. No bruits or masses. Good bowel sounds.  Extremities: no cyanosis, clubbing, rash, 1+ edema R and LLE to thighs  SCDs femoral trialysis catheter Neuro: alert & orientedx3, cranial nerves grossly intact. moves all 4 extremities w/o difficulty. Affect pleasant   Telemetry: SR 90s  Labs: Basic Metabolic Panel:  Recent Labs Lab 06/11/14 0204 06/11/14 0415  06/12/14 0450 06/12/14 1536 06/13/14 0449 06/13/14 1515 06/14/14 0551 06/14/14 1600  NA  --  135*  < > 134* 134* 139 134* 139  138 133*  K  --  4.4  < > 4.4 3.7 3.6* 4.2 4.2  4.1 3.9  CL  --  96  < > 99 98 100 97 99  99 97  CO2  --  25  < > 21 26 26 21 25  26 24   GLUCOSE  --  129*  < > 143* 121* 105* 135* 119*  121* 150*  BUN  --  20  < > 15 10 10 10 9  9 10   CREATININE  --  1.49*  < > 1.47* 1.08 1.53* 1.48* 1.48*  1.51* 1.63*  CALCIUM  --  8.6  < > 8.3* 8.1* 8.4 8.1* 8.9  8.8 8.1*  MG 2.4 2.4  --  2.6*  --  2.5  --  2.7*  --   PHOS  --  2.2*  < > 1.5* 1.0* 2.6 2.7 2.6 1.7*  < > = values in this interval not displayed.  Liver Function Tests:  Recent Labs Lab 06/08/14 1040 06/08/14 1216  06/09/14 0358 06/10/14 0430  06/11/14 0415  06/12/14 1536 06/13/14 0449 06/13/14 1515 06/14/14 0551 06/14/14 1600  AST 85* 98*  --  492* 417*  --  360*  --   --   --   --   --   --   ALT 66* 76*  --  282* 363*  --  380*  --   --   --   --   --   --   ALKPHOS 95 94  --  77 95  --  87  --   --   --   --   --   --   BILITOT 3.8* 3.9*  --  2.8* 3.2*  --  3.0*  --   --   --   --   --   --   PROT 8.6* 8.4*  --  7.1 8.8*  --  7.9  --   --   --   --   --   --   ALBUMIN 3.4* 3.3*  < > 3.0* 3.4*  < > 3.0*  < > 2.6* 2.6* 2.7* 2.9* 2.5*  < > = values  in this interval not displayed. No results for input(s): LIPASE, AMYLASE in the last 168 hours. No results for input(s): AMMONIA in the last 168 hours.  CBC:  Recent Labs Lab 06/08/14 1216  06/10/14 0430 06/11/14 0415 06/12/14 0450 06/13/14 0449 06/14/14 0551  WBC 7.3  < > 11.5* 10.9* 7.4 6.7 6.2  NEUTROABS 6.2  --   --   --  5.3 4.4 4.0  HGB 12.0*  < > 11.3* 10.9* 11.0* 10.0* 11.3*  HCT 38.6*  < > 37.0* 35.9* 35.0* 32.5* 37.1*  MCV 78.8  < > 79.7 80.7 78.1 77.4* 77.6*  PLT 178  < > 195 153 134* 136* 139*  < > = values in this interval not displayed.  Cardiac Enzymes:  Recent Labs Lab 06/08/14 1040  TROPONINI <0.30    BNP: BNP (last 3 results)  Recent Labs  06/08/14 1040 06/12/14 0450  PROBNP 11335.0* 6716.0*     Other results:    Imaging: No results found.   Medications:     Scheduled Medications: . amiodarone  200 mg Oral Daily  . antiseptic oral rinse  7 mL Mouth Rinse BID  . calcitRIOL  0.25 mcg Oral Daily  . feeding supplement (NEPRO CARB STEADY)  237 mL Oral BID BM  . insulin aspart  2-6 Units Subcutaneous 6 times per day  . pantoprazole  40 mg Oral Daily  . vancomycin  1,500 mg Intravenous Q24H    Infusions: . DOBUTamine 7.5 mcg/kg/min (06/14/14 2115)  . heparin 10,000 units/ 20 mL infusion syringe 550 Units/hr (06/15/14 0643)  . heparin 1,050 Units/hr (06/14/14 2115)  . dialysis replacement fluid (prismasate) 800 mL/hr at 06/15/14 0630  . dialysis replacement fluid (prismasate) 700 mL/hr at 06/14/14 1737  . dialysate (PRISMASATE) 2,000 mL/hr at 06/15/14 0630    PRN Medications: heparin, heparin,  sodium chloride   Assessment:   1. Acute respiratory failure  2. A/c systolic HF due to NICM EF < 15%  3. Acute on chronic renal failure stage IV  4. Hyperkalemia  5. Cardiogenic shock  6. DM2  7. Cocaine abuse in remission 3-4 months  8. H/o gout  9. Supratherapeutic INR  10. LV thrombus  11. PAF on amio  12. H/o VT 13. Fever  11/1   Plan/Discussion:    Volume status improving. But CVP surprisingly remains 20. Essentially anuric. Continue CVVHD.  Weaning dobutamine is certainly a consideration but unfortunately I don't think he will tolerate it for long. In June of this year he had shock as well and required IABP and required dobutamine subsequently. Currently requiring very high dose dobutamine (now at 7 mcg/kg/min) to support CVVHD,  At this point his only hope for survival is for his kidneys to recover enough to maintain his volume status without HD support. Thus would continue CVVHD until nearly all fluid is removed. Once fluid is removed then I think we can remove central lines and give dobutamine peripherally in order to give him a line holiday and check surveillance cultures.   If kidneys don't recover, we can consider trying to wean dobutamine and try iHD but I am a pessimistic that this will be successful and Comfort Care may be only option.     Daniel Bensimhon,MD 6:59 AM

## 2014-06-15 NOTE — Progress Notes (Signed)
[facility]  Date of Admission:  06/08/2014  Antibiotics: vancomycin  Subjective: No complaints  Objective: Temp:  [98.1 F (36.7 C)-99.9 F (37.7 C)] 98.6 F (37 C) (11/06 0900) Pulse Rate:  [88-100] 98 (11/06 0900) Resp:  [9-25] 20 (11/06 0900) BP: (66-102)/(31-75) 98/62 mmHg (11/06 0900) SpO2:  [92 %-100 %] 100 % (11/06 0900) Weight:  [225 lb 12 oz (102.4 kg)] 225 lb 12 oz (102.4 kg) (11/06 0500)  General: awake, alert, nad Skin: no rashes Lungs: CTA, anterior Cor: RRR Abdomen: soft, nt  Lab Results Lab Results  Component Value Date   WBC 6.8 06/15/2014   HGB 11.0* 06/15/2014   HCT 35.0* 06/15/2014   MCV 76.8* 06/15/2014   PLT 134* 06/15/2014    Lab Results  Component Value Date   CREATININE 1.69* 06/15/2014   BUN 9 06/15/2014   NA 137 06/15/2014   K 4.3 06/15/2014   CL 99 06/15/2014   CO2 25 06/15/2014    Lab Results  Component Value Date   ALT 380* 06/11/2014   AST 360* 06/11/2014   ALKPHOS 87 06/11/2014   BILITOT 3.0* 06/11/2014      Microbiology: Recent Results (from the past 240 hour(s))  MRSA PCR Screening     Status: None   Collection Time: 06/08/14  1:16 PM  Result Value Ref Range Status   MRSA by PCR NEGATIVE NEGATIVE Final    Comment:        The GeneXpert MRSA Assay (FDA approved for NASAL specimens only), is one component of a comprehensive MRSA colonization surveillance program. It is not intended to diagnose MRSA infection nor to guide or monitor treatment for MRSA infections.  Urine culture     Status: None   Collection Time: 06/09/14 10:32 AM  Result Value Ref Range Status   Specimen Description URINE, CATHETERIZED  Final   Special Requests Immunocompromised  Final   Culture  Setup Time   Final    06/09/2014 20:50 Performed at [facility]    Colony Count NO GROWTH Performed at [facility]   Final   Culture NO GROWTH Performed at [facility]   Final   Report  Status 06/10/2014 FINAL  Final  Culture, blood (routine x 2)     Status: None   Collection Time: 06/10/14  3:39 AM  Result Value Ref Range Status   Specimen Description BLOOD LEFT HAND  Final   Special Requests BOTTLES DRAWN AEROBIC ONLY 4CC  Final   Culture  Setup Time   Final    06/10/2014 17:58 Performed at [facility]    Culture   Final    BACILLUS SPECIES Note: Standardized susceptibility testing for this organism is not available. Note: Gram Stain Report Called to,Read Back By and Verified With: BRYCE SHEPARD @ 3:58AM 11.2.15 BY DESIS Performed at [facility]    Report Status 06/12/2014 FINAL  Final  Culture, blood (routine x 2)     Status: None   Collection Time: 06/10/14  3:52 AM  Result Value Ref Range Status   Specimen Description BLOOD LEFT HAND  Final   Special Requests BOTTLES DRAWN AEROBIC ONLY 8CC  Final   Culture  Setup Time   Final    06/10/2014 17:58 Performed at [facility]    Culture   Final    BACILLUS SPECIES Note: Standardized susceptibility testing for this organism is not available. Note: Gram Stain Report Called to,Read Back By and  Verified With: [facility] @ 3:58AM 11.2.15 BY DESIS Culture results may be compromised due to an excessive volume of blood received in culture bottles. Performed at [facility]    Report Status 06/12/2014 FINAL  Final  Culture, blood (routine x 2)     Status: None (Preliminary result)   Collection Time: 06/12/14  1:54 PM  Result Value Ref Range Status   Specimen Description BLOOD LEFT HAND  Final   Special Requests   Final    BOTTLES DRAWN AEROBIC AND ANAEROBIC BLUE 10CC RED 5CC   Culture  Setup Time   Final    06/12/2014 20:35 Performed at [facility]    Culture   Final           BLOOD CULTURE RECEIVED NO GROWTH TO DATE CULTURE WILL BE HELD FOR 5 DAYS BEFORE ISSUING A FINAL NEGATIVE REPORT Performed at [facility]    Report Status PENDING  Incomplete    Culture, blood (routine x 2)     Status: None (Preliminary result)   Collection Time: 06/12/14  2:05 PM  Result Value Ref Range Status   Specimen Description BLOOD LEFT ARM  Final   Special Requests BOTTLES DRAWN AEROBIC AND ANAEROBIC 10CC  Final   Culture  Setup Time   Final    06/12/2014 20:35 Performed at [facility]    Culture   Final           BLOOD CULTURE RECEIVED NO GROWTH TO DATE CULTURE WILL BE HELD FOR 5 DAYS BEFORE ISSUING A FINAL NEGATIVE REPORT Performed at [facility]    Report Status PENDING  Incomplete    Studies/Results: No results found.  Assessment/Plan: 1)  Bacillus bacteremia - on vancomycin.  Repeat blood cultures remain clear to date.  Working on [employer].    Dr. Orvan Falconer is available over the weekend if needed, otherwise Dr. Daiva Eves will follow up on Monday.  Thanks  Staci Righter, MD [facility] [email or web address] [phone] pager   [phone] cell 06/15/2014, 9:40 AM

## 2014-06-15 NOTE — Progress Notes (Signed)
[facility] Rounding Note  Subjective: Continues to be without complaint. Denies CP or SOB.   Objective: Vital signs in last 24 hours: Temp:  [98.1 F (36.7 C)-99.9 F (37.7 C)] 98.5 F (36.9 C) (11/06 0700) Pulse Rate:  [88-100] 96 (11/06 0700) Resp:  [9-25] 15 (11/06 0700) BP: (66-102)/(31-75) 85/62 mmHg (11/06 0700) SpO2:  [92 %-100 %] 100 % (11/06 0700) Weight:  [225 lb 12 oz (102.4 kg)] 225 lb 12 oz (102.4 kg) (11/06 0500) Weight change: -8 lb 13.1 oz (-4 kg)  Intake/Output from previous day: 11/05 0701 - 11/06 0700 In: 938 [I.V.:473; IV Piggyback:250] Out: 3783 [Urine:70]  Down -2.8 L in last 24 hrs; Net - 13 L since admit  Weight Trending 10/30  112.4 kg 10/31  114.1 kg 11/1    113.5 kg 11/2    111.2 kg     11/3     108.5 kg   11/4     106.8 kg   11/5     106.4 kg 11/6    102.4 kg  Total I/O In: -  Out: 183 [Other:183] Physical Examination: BP 85/62 mmHg  Pulse 96  Temp(Src) 98.5 F (36.9 C) (Core (Comment))  Resp 15  Ht 6' (1.829 m)  Wt 225 lb 12 oz (102.4 kg)  BMI 30.61 kg/m2  SpO2 100%  CVP = 20   Awake; A&Ox3  VS as above RRR; S1S2 + s3 w/ 2/6 systolic murmur Lungs CTAB  2+ edema bilat notable at posterior thigh and hips PICC in right arm, trialysis catheter in right groin (10/30) Foley with minimal muddy brown urine  Recent Labs  06/14/14 0551 06/15/14 0526  WBC 6.2 6.8  HGB 11.3* 11.0*  HCT 37.1* 35.0*  PLT 139* 134*    Recent Labs  06/14/14 1600 06/15/14 0526  NA 133* 137  K 3.9 4.3  CL 97 99  CO2 24 25  GLUCOSE 150* 131*  BUN 10 9  CREATININE 1.63* 1.69*  CALCIUM 8.1* 8.7  Phosphorus                         1.8 Mag                                      2.8  Results for Hogate, [name] (MRN [id number]) as of 06/11/2014 07:18  Ref. Range 06/09/2014 07:30  Iron Latest Range: 42-135 ug/dL 21 (L)  UIBC Latest Range: 125-400 ug/dL 409320  TIBC Latest Range: 215-435 ug/dL 811341  Saturation Ratios Latest Range:  20-55 % 6 (L)    Studies/Results: No results found. Scheduled Medications . amiodarone  200 mg Oral Daily  . antiseptic oral rinse  7 mL Mouth Rinse BID  . calcitRIOL  0.25 mcg Oral Daily  . feeding supplement (NEPRO CARB STEADY)  237 mL Oral BID BM  . insulin aspart  2-6 Units Subcutaneous 6 times per day  . pantoprazole  40 mg Oral Daily  . vancomycin  1,500 mg Intravenous Q24H   Infusion/other Medications . DOBUTamine 7 mcg/kg/min (06/15/14 0700)  . heparin 10,000 units/ 20 mL infusion syringe 550 Units/hr (06/15/14 0643)  . heparin 1,050 Units/hr (06/14/14 2115)  . dialysis replacement fluid (prismasate) 800 mL/hr at 06/15/14 0630  . dialysis replacement fluid (prismasate) 700 mL/hr at 06/14/14 1737  . 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   He was admitted 10/30 with respiratory failure, worsening heart failure, creatinine of 4.6, hyperkalemia (6.8) and wide complex bradycardia. CRRT initiated 10/30 for volume control, renal failure. Has BC+ 11/1 for Bacillus on Vanc (ID following). He is not considered a candidate for outpt HD given need for chronic IV inotropes (and Dr. Teressa Lower skeptical that he could survive without the dobutamine).    Assessment/Plan:  AKI on CKD4  Remains oliguric. Unclear how much recovery he will get. CVP at 20 and volume status improving. Pt is not candidate for heart transplant or pump therapy. Would be very poor candidate for outpt HD given need for dobutamine for past several months. If unable to recover renal fcn will need to discuss palliative options.  Dr Kittie Plater to decide when volume status is improved sufficiently to try off CRRT.  Continue CRRT 4K bath; Rate neg 100 to 150 hour   to facilitate fluid removal (Discussed with Dr Gala Romney) as his vitals are currently stable and hold off on Catheter Holiday until fluid status has improved.   Plan for Catheter holiday when we attempt IHD and see if renal fcn will improve.   Question whether the Dobutamine could be stopped once dry weight is achieved and be able to tolerate IHD?- Dr Gala Romney doubts this would be possible but could attempt.  CM with acute on chronic systolic heart failure/NICM  EF < 15% - on dobutamine. Per cardiology.   Hypophosphatemia- 1.8 on 11/6. - Replace sodium phosphate 10 mmol x 2 (since requiring repeated replacement will start Q12 hour dosing of sodium phos)  H/O VT  PAF: INR 1.93 (11/4) IV heparin per Pharm  Anemia stable. Fe deficient but withhold IV iron in setting of bacteremia  PTH: Elevated at 168: Calcitriol (0.25 mcg) PO qd  Bacillus bacteremia (11/1). ID following. On vanco (11/1>>); zosyn stopped (11/1>>11/3)  Needs Catheter Holiday - Will continue with CRRT for fluid removal as he is currently stable and plan to remove line when we attempt to transition to IHD  Cocaine abuse - none for 4 months     LOS: 7 days   Wenda Low 06/15/2014,8:49 AM   I have seen and examined this patient and agree with plan and assessment in the above note of Dr. Gayla Doss.  Continuing CRRT for the time being. Pt essentially anuric now. Dr. Teressa Lower skeptical that pt would make it without inotropes (which precludes outpt HD) and I am pessimistic as time goes on that he will have any renal function recovery. [name],MD 06/15/2014 9:58 AM

## 2014-06-15 NOTE — Progress Notes (Signed)
ANTICOAGULATION CONSULT NOTE - Follow Up Consult  Pharmacy Consult for heparin Indication: atrial fibrillation and apical thrombus  Labs:  Recent Labs  06/12/14 0450  06/13/14 0449  06/13/14 1515 06/14/14 0551 06/14/14 0925 06/14/14 1600 06/14/14 2250  HGB 11.0*  --  10.0*  --   --  11.3*  --   --   --   HCT 35.0*  --  32.5*  --   --  37.1*  --   --   --   PLT 134*  --  136*  --   --  139*  --   --   --   APTT 72*  --  71*  --   --   --  186*  --   --   LABPROT 27.4*  < > 22.2*  --   --  19.8* 18.9*  --   --   INR 2.52*  < > 1.93*  --   --  1.67* 1.57*  --   --   HEPARINUNFRC  --   --   --   < >  --  0.78* 0.67 0.38 0.45  CREATININE 1.47*  < > 1.53*  --  1.48* 1.48*  1.51*  --  1.63*  --   < > = values in this interval not displayed.   Assessment/Plan:  Heparin level remains therapeutic.  Will continue gtt and monitor daily levels.  Vernard Gambles, PharmD, BCPS  06/15/2014,12:40 AM

## 2014-06-15 NOTE — Progress Notes (Signed)
ANTICOAGULATION/ ANTIBIOTIC CONSULT NOTE - Follow Up Consult  Pharmacy Consult for Heparin; Vancomycin Indication: atrial fibrillation and and apical thrombus; bacillus cereus bacteremia  No Known Allergies  Patient Measurements: Height: 6' (182.9 cm) Weight: 225 lb 12 oz (102.4 kg) IBW/kg (Calculated) : 77.6 Heparin Dosing Weight: 92 kg  Vital Signs: Temp: 98.5 F (36.9 C) (11/06 0700) Temp Source: Core (Comment) (11/06 0400) BP: 85/62 mmHg (11/06 0700) Pulse Rate: 96 (11/06 0700)  Labs:  Recent Labs  06/13/14 0449  06/14/14 0551 06/14/14 0925 06/14/14 1600 06/14/14 2250 06/15/14 0526  HGB 10.0*  --  11.3*  --   --   --  11.0*  HCT 32.5*  --  37.1*  --   --   --  35.0*  PLT 136*  --  139*  --   --   --  134*  APTT 71*  --   --  186*  --   --  103*  LABPROT 22.2*  --  19.8* 18.9*  --   --  18.7*  INR 1.93*  --  1.67* 1.57*  --   --  1.55*  HEPARINUNFRC  --   < > 0.78* 0.67 0.38 0.45 0.40  CREATININE 1.53*  < > 1.48*  1.51*  --  1.63*  --  1.69*  < > = values in this interval not displayed.  Estimated Creatinine Clearance: 53.9 mL/min (by C-G formula based on Cr of 1.69).  Assessment: 63 year old male with h/o  atrial fibrillation and apical thrombus.   Anticoagulation:hx afib/apical thrombus on coum pta. INR 4.7 on admit - CCM wants heparin started after HD cath placed (done 10/30 at 1600) when INR<2, received FFP in ED 10/30. INR down to 1.55, HL elev 0.4 this am - continue rate. (note 0.67 level is drawn 1.5hr after dose decr?).  Infectious disease: Vanc D#6 for bacillus cereus bacteremia (likely due to nonsterile manipulation of dobutamine line at home). Pt denies h/o IVDA. ID following - would like to find a way to have line holiday if possible. Continues on CVVHD. WBC wnl. LA 10.7 >>1.7. Tm down to 100.2.   VT 12.7 mcg/ml (slightly subtherapeutic) on 1250mg  IV q24h  11/1 Vanc>> 11/1 Zosyn>>11/3  10/31 urine>>neg 11/1 bld cx>> 2/2 bacillus species 11/3 Bld  cx>>  Goal of Therapy:  Heparin level 0.3-0.7 Monitor platelets by anticoagulation protocol: Yes    Plan:  Continue heparin at 1050 units/hr.  Daily heparin level and CBC Continue Vancomycin 1500mg  IV q24h while on CRRT Follow-up CRRT tolerance, micro data, and patient's clinical condition Vanc levels as appropriate  Link Snuffer, PharmD, BCPS Clinical Pharmacist [phone]  06/15/2014 9:25 AM

## 2014-06-16 LAB — RENAL FUNCTION PANEL
Albumin: 2.7 g/dL — ABNORMAL LOW (ref 3.5–5.2)
Albumin: 2.7 g/dL — ABNORMAL LOW (ref 3.5–5.2)
Anion gap: 14 (ref 5–15)
Anion gap: 13 (ref 5–15)
BUN: 7 mg/dL (ref 6–23)
BUN: 10 mg/dL (ref 6–23)
CALCIUM: 8.6 mg/dL (ref 8.4–10.5)
CALCIUM: 8.4 mg/dL (ref 8.4–10.5)
CO2: 24 meq/L (ref 19–32)
CO2: 23 meq/L (ref 19–32)
CREATININE: 2.01 mg/dL — AB (ref 0.50–1.35)
Chloride: 94 mEq/L — ABNORMAL LOW (ref 96–112)
Chloride: 95 mEq/L — ABNORMAL LOW (ref 96–112)
Creatinine, Ser: 1.64 mg/dL — ABNORMAL HIGH (ref 0.50–1.35)
GFR calc Af Amer: 38 mL/min — ABNORMAL LOW (ref >90)
GFR calc non Af Amer: 33 mL/min — ABNORMAL LOW (ref >90)
GFR, EST AFRICAN AMERICAN: 49 mL/min — AB (ref >90)
GFR, EST NON AFRICAN AMERICAN: 42 mL/min — AB (ref >90)
GLUCOSE: 116 mg/dL — AB (ref 70–99)
GLUCOSE: 109 mg/dL — AB (ref 70–99)
Phosphorus: 2.1 mg/dL — ABNORMAL LOW (ref 2.3–4.6)
Phosphorus: 2.1 mg/dL — ABNORMAL LOW (ref 2.3–4.6)
Potassium: 4.5 mEq/L (ref 3.7–5.3)
Potassium: 4.5 mEq/L (ref 3.7–5.3)
Sodium: 132 mEq/L — ABNORMAL LOW (ref 137–147)
Sodium: 131 mEq/L — ABNORMAL LOW (ref 137–147)

## 2014-06-16 LAB — CBC
HCT: 34.1 % — ABNORMAL LOW (ref 39.0–52.0)
Hemoglobin: 10.6 g/dL — ABNORMAL LOW (ref 13.0–17.0)
MCH: 24.5 pg — AB (ref 26.0–34.0)
MCHC: 31.1 g/dL (ref 30.0–36.0)
MCV: 78.9 fL (ref 78.0–100.0)
PLATELETS: 130 10*3/uL — AB (ref 150–400)
RBC: 4.32 MIL/uL (ref 4.22–5.81)
RDW: 21.2 % — AB (ref 11.5–15.5)
WBC: 6.2 10*3/uL (ref 4.0–10.5)

## 2014-06-16 LAB — PROTIME-INR
INR: 1.43 (ref 0.00–1.49)
PROTHROMBIN TIME: 17.6 s — AB (ref 11.6–15.2)

## 2014-06-16 LAB — MAGNESIUM: MAGNESIUM: 2.6 mg/dL — AB (ref 1.5–2.5)

## 2014-06-16 LAB — POCT ACTIVATED CLOTTING TIME
ACTIVATED CLOTTING TIME: 197 s
Activated Clotting Time: 197 seconds
Activated Clotting Time: 197 seconds
Activated Clotting Time: 197 seconds
Activated Clotting Time: 202 seconds

## 2014-06-16 LAB — GLUCOSE, CAPILLARY
GLUCOSE-CAPILLARY: 111 mg/dL — AB (ref 70–99)
Glucose-Capillary: 113 mg/dL — ABNORMAL HIGH (ref 70–99)
Glucose-Capillary: 112 mg/dL — ABNORMAL HIGH (ref 70–99)
Glucose-Capillary: 158 mg/dL — ABNORMAL HIGH (ref 70–99)
Glucose-Capillary: 166 mg/dL — ABNORMAL HIGH (ref 70–99)
Glucose-Capillary: 105 mg/dL — ABNORMAL HIGH (ref 70–99)

## 2014-06-16 LAB — APTT: APTT: 118 s — AB (ref 24–37)

## 2014-06-16 LAB — HEPARIN LEVEL (UNFRACTIONATED): Heparin Unfractionated: 0.39 IU/mL (ref 0.30–0.70)

## 2014-06-16 NOTE — Progress Notes (Signed)
ANTICOAGULATION CONSULT NOTE - Initial Consult  Pharmacy Consult for Heparin Indication: atrial fibrillation and apical thrombus  No Known Allergies  Patient Measurements: Height: 6' (182.9 cm) Weight: 226 lb 13.7 oz (102.9 kg) IBW/kg (Calculated) : 77.6 Heparin Dosing Weight: 92 kg  Vital Signs: Temp: 99.8 F (37.7 C) (11/07 0900) Temp Source: Core (Comment) (11/07 0400) BP: 90/65 mmHg (11/07 0900) Pulse Rate: 102 (11/07 0900)  Labs:  Recent Labs  06/14/14 0551 06/14/14 0925  06/14/14 2250 06/15/14 0526 06/15/14 1650 06/16/14 0248 06/16/14 0249  HGB 11.3*  --   --   --  11.0*  --   --  10.6*  HCT 37.1*  --   --   --  35.0*  --   --  34.1*  PLT 139*  --   --   --  134*  --   --  130*  APTT  --  186*  --   --  103*  --  118*  --   LABPROT 19.8* 18.9*  --   --  18.7*  --  17.6*  --   INR 1.67* 1.57*  --   --  1.55*  --  1.43  --   HEPARINUNFRC 0.78* 0.67  < > 0.45 0.40  --  0.39  --   CREATININE 1.48*  1.51*  --   < >  --  1.69* 1.36* 1.64*  --   < > = values in this interval not displayed.  Estimated Creatinine Clearance: 55.7 mL/min (by C-G formula based on Cr of 1.64).   Medical History: Past Medical History  Diagnosis Date  . Diabetes mellitus without complication   . Chronic systolic heart failure     a) ECHO [facility] 02/2014): EF <15%, multiple apical thrombi, RV mod enlarged, mod MR b. RHC (02/21/14) at [facility]: RA 16, RV 64/9 (15), PA 64/32 (42), PCWP: 24, CI: 1.4  . HTN (hypertension)   . HLD (hyperlipidemia)   . CKD (chronic kidney disease), stage III   . Apical mural thrombus   . Mitral regurgitation   . Drug use     cocaine  . Atrial fibrillation, chronic     Medications:  Infusions:  . DOBUTamine 7 mcg/kg/min (06/16/14 0154)  . heparin 10,000 units/ 20 mL infusion syringe 600 Units/hr (06/16/14 0840)  . heparin 1,050 Units/hr (06/16/14 0501)  . dialysis replacement fluid (prismasate) 800 mL/hr at 06/16/14 0829  . dialysis replacement fluid  (prismasate) 700 mL/hr at 06/16/14 1055  . dialysate (PRISMASATE) 2,000 mL/hr at 06/16/14 1055    Assessment: 63 year old male on anticoagulation with Heparin for atrial fibrillation and apical thrombus.  His Coumadin is on hold.  Heparin remains therapeutic.  No bleeding noted, Hgb and PLTC stable.  Goal of Therapy:  Heparin level 0.3-0.7 units/ml Monitor platelets by anticoagulation protocol: Yes   Plan:  Continue Heparin at 1050 units/hr Daily heparin level and CBC  Estella Husk, Pharm.D., BCPS, AAHIVP Clinical Pharmacist Phone: [phone] or [phone] 06/16/2014, 11:54 AM

## 2014-06-16 NOTE — Progress Notes (Signed)
[facility] Rounding Note  Subjective: Continues to be without complaint.  Denies CP or SOB. Remains on CRRT Attempting up to -150/hour net neg Did not lose any weight past 24 hours - RN reported they had held off on pulling fluid for a few hours during the night but still should have lost some  Objective: Vital signs in last 24 hours: Temp:  [98.4 F (36.9 C)-99.5 F (37.5 C)] 99.3 F (37.4 C) (11/07 0700) Pulse Rate:  [88-102] 96 (11/07 0700) Resp:  [9-25] 19 (11/07 0700) BP: (64-108)/(47-81) 98/67 mmHg (11/07 0700) SpO2:  [96 %-100 %] 97 % (11/07 0700) Weight:  [226 lb 13.7 oz (102.9 kg)] 226 lb 13.7 oz (102.9 kg) (11/07 0500) Weight change: 1 lb 1.6 oz (0.5 kg)  Intake/Output from previous day: 11/06 0701 - 11/07 0700 In: 2324.6 [P.O.:710; I.V.:506.3; IV Piggyback:968.3] Out: 4360 [Urine:43]  Down -2.0 L in last 24 hrs; Net - 15 L since admit  Weight Trending 10/30  112.4 kg 10/31  114.1 kg 11/1    113.5 kg 11/2    111.2 kg     11/3     108.5 kg   11/4     106.8 kg   11/5     106.4 kg 11/6    102.4 kg 11/7    102.9 kg    Physical Examination: BP 98/67 mmHg  Pulse 96  Temp(Src) 99.3 F (37.4 C) (Core (Comment))  Resp 19  Ht 6' (1.829 m)  Wt 226 lb 13.7 oz (102.9 kg)  BMI 30.76 kg/m2  SpO2 97%  CVP = 13?   Awake; A&Ox3 and VS as above RRR; S1S2 + s3 w/ 2/6 systolic murmur Lungs CTAB  2+ edema bilat notable at posterior thigh and hips - somewhat less PICC in right arm, trialysis catheter in right groin (10/30) Foley with minimal muddy brown urine  Recent Labs  06/15/14 0526 06/16/14 0249  WBC 6.8 6.2  HGB 11.0* 10.6*  HCT 35.0* 34.1*  PLT 134* 130*    Recent Labs  06/15/14 1650 06/16/14 0248  NA 134* 132*  K 4.1 4.5  CL 97 94*  CO2 26 24  GLUCOSE 190* 116*  BUN 7 7  CREATININE 1.36* 1.64*  CALCIUM 8.1* 8.6  Phosphorus                         2.1 Mag                                      2.6  Results for Aslin,  [name] (MRN [id number]) as of 06/11/2014 07:18  Ref. Range 06/09/2014 07:30  Iron Latest Range: 42-135 ug/dL 21 (L)  UIBC Latest Range: 125-400 ug/dL 132320  TIBC Latest Range: 215-435 ug/dL 440341  Saturation Ratios Latest Range: 20-55 % 6 (L)    Studies/Results: No results found. Scheduled Medications . amiodarone  200 mg Oral Daily  . antiseptic oral rinse  7 mL Mouth Rinse BID  . calcitRIOL  0.25 mcg Oral Daily  . feeding supplement (NEPRO CARB STEADY)  237 mL Oral BID BM  . insulin aspart  2-6 Units Subcutaneous 6 times per day  . pantoprazole  40 mg Oral Daily  . sodium phosphate  Dextrose 5% IVPB  10 mmol Intravenous Q12H  . vancomycin  1,500 mg Intravenous Q24H   Infusion/other Medications . DOBUTamine 7 mcg/kg/min (06/16/14  0154)  . heparin 10,000 units/ 20 mL infusion syringe 600 Units/hr (06/16/14 0700)  . heparin 1,050 Units/hr (06/16/14 0501)  . dialysis replacement fluid (prismasate) 800 mL/hr at 06/16/14 0321  . dialysis replacement fluid (prismasate) 700 mL/hr at 06/16/14 0153  . 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 and maintained on outpt dobutamine 48mcg/kg/min, with baseline CKD (creatinine 1.6 04/16/14, 3.3 on 05/23/14), cocaine abuse (quit 4 months ago) DM2, AF on amio.   He was admitted 10/30 with respiratory failure, worsening heart failure, creatinine of 4.6, hyperkalemia (6.8) and wide complex bradycardia. CRRT initiated 10/30 for volume control, renal failure. Has BC+ 11/1 for Bacillus on Vanc (ID following). He is not considered a candidate for outpt HD given need for chronic IV inotropes (and Dr. Teressa Lower skeptical that he could survive without the dobutamine).    Assessment/Plan:  AKI on CKD4  Remains oliguric. Unclear how much recovery he will get. CVP at ?13 and volume status improving. Pt is not candidate for heart transplant or pump therapy. Would  not be candidate for outpt HD given need for outpt dobutamine for past several months. If unable to recover renal fcn will need to discuss palliative options.  Dr Kittie Plater to decide when volume status is improved sufficiently to try off CRRT.  Continue CRRT 4K bath; Rate net neg 100 to 150 hour  to facilitate fluid removal and hold off on Catheter Holiday until fluid status has improved. (Dr. Kandis Mannan preference)  Plan for Catheter holiday when we get to a point to attempt IHD and see if renal fcn will improve.   Question whether the Dobutamine could be stopped once dry weight is achieved and be able to tolerate IHD?- Dr Gala Romney doubts this would be possible but could attempt.  CM with acute on chronic systolic heart failure/NICM  EF < 15% - on dobutamine. Per cardiology.   Hypophosphatemia- 2.1 on 11/7. - Continue sodium phosphate 10 mmol q12 hours  H/O VT  PAF: INR 1.43 (11/4) IV heparin per Pharm  Anemia stable. Fe deficient but withhold IV iron in setting of bacteremia  PTH: Elevated at 168: Calcitriol (0.25 mcg) PO qd  Bacillus bacteremia (11/1). ID following. On vanco (11/1>>); zosyn stopped (11/1>>11/3)  Needs Catheter Holiday - Will continue with CRRT for fluid removal as he is currently stable and plan to remove line when we attempt to transition to IHD  Cocaine abuse - none for 4 months     LOS: 8 days   Jacquelin Hawking, MD PGY-2, [facility] 06/16/2014, 7:59 AM   I have seen and examined this patient and agree with plan and assessment in the above note. Stay the course with current rate of fluid removal. Dr. Teressa Lower wants to make the decision when to stop CRRT/catheter holiday and trial of IHD. Unfortunately he does not think pt will tolerate being off dobutamine and as such long term HD will not be option, palliative approach unless we can think of some other alternative.  I am very pessimistic about any renal recover. Remove foley. Continue same CRRT.  (net neg 150, all 4K, heparin, standing IV phos replacement) Nanna Ertle B,MD 06/16/2014 8:17 AM

## 2014-06-16 NOTE — Progress Notes (Signed)
Patient ID: [name], male   DOB: August 28, 1950, 63 y.o.   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 Maintained on dobutamine 43mcg/kg/min.   Past medical h/o is notable for CKD with baseline Cr 3.0, cocaine abuse, DM2, AF on amio, VT. He has not been candidate for advanced therapies due to cocaine abuse (quit 4 months ago) and renal failure. Started on dobutamine. Moved to [place] to try to stay off cocaine.   On Oct 14 seen at [facility]. CR was 3.3 (on 04/16/2014 Cr was 1.6 at [facility] on discharge) and potassium was low. Kcl was increased. Over past week increased dyspnea and edema. NYHA IIIB symptoms at baseline.   10/30 Admitted with rep failure with wide complex bradycardia. K 6.8 and Cr 4.6. Intubated and CVVHD started.  Extubated 11/1.  Continues on CVVHD.  Bcx + for bacillus. ID requesting line holiday.  Feels fine. I/os - 2 liters but weight up 1 pound. CVP now down to 14-15. Dobutamine remains 7  SPB in 80-90s.  U/o < 10cc/hr.   Objective:   Weight Range:  Vital Signs:   Temp:  [98.5 F (36.9 C)-99.8 F (37.7 C)] 99.8 F (37.7 C) (11/07 0900) Pulse Rate:  [88-102] 102 (11/07 0900) Resp:  [9-25] 17 (11/07 0900) BP: (64-108)/(47-81) 90/65 mmHg (11/07 0900) SpO2:  [96 %-100 %] 99 % (11/07 0900) Weight:  [102.9 kg (226 lb 13.7 oz)] 102.9 kg (226 lb 13.7 oz) (11/07 0500) Last BM Date: 06/13/14  Weight change: Filed Weights   06/14/14 0500 06/15/14 0500 06/16/14 0500  Weight: 106.4 kg (234 lb 9.1 oz) 102.4 kg (225 lb 12 oz) 102.9 kg (226 lb 13.7 oz)    Intake/Output:   Intake/Output Summary (Last 24 hours) at 06/16/14 1207 Last data filed at 06/16/14 1000  Gross per 24 hour  Intake 1317.13 ml  Output   3441 ml  Net -2123.87 ml     Physical Exam: CVP 20 General: NAD  Awake in bed. Bair hugger in place HEENT: normal  Neck: supple. JVP to ear.  Carotids  2+ bilat; no bruits. No lymphadenopathy or thryomegaly appreciated.  Cor: PMI laterally displaced. Regular rate & rhythm. 2/6 MR +s3  Lungs: + clear Abdomen: soft, nontender, nondistended. No hepatosplenomegaly. No bruits or masses. Good bowel sounds.  Extremities: no cyanosis, clubbing, rash, 1+ edema R and LLE to thighs  SCDs femoral trialysis catheter Neuro: alert & orientedx3, cranial nerves grossly intact. moves all 4 extremities w/o difficulty. Affect pleasant   Telemetry: SR 90s  Labs: Basic Metabolic Panel:  Recent Labs Lab 06/12/14 0450  06/13/14 0449  06/14/14 0551 06/14/14 1600 06/15/14 0526 06/15/14 1650 06/16/14 0248 06/16/14 0249  NA 134*  < > 139  < > 139  138 133* 137 134* 132*  --   K 4.4  < > 3.6*  < > 4.2  4.1 3.9 4.3 4.1 4.5  --   CL 99  < > 100  < > 99  99 97 99 97 94*  --   CO2 21  < > 26  < > 25  26 24 25 26 24   --   GLUCOSE 143*  < > 105*  < > 119*  121* 150* 131* 190* 116*  --   BUN 15  < > 10  < > 9  9 10 9 7 7   --  CREATININE 1.47*  < > 1.53*  < > 1.48*  1.51* 1.63* 1.69* 1.36* 1.64*  --   CALCIUM 8.3*  < > 8.4  < > 8.9  8.8 8.1* 8.7 8.1* 8.6  --   MG 2.6*  --  2.5  --  2.7*  --  2.8*  --   --  2.6*  PHOS 1.5*  < > 2.6  < > 2.6 1.7* 1.8* 1.6* 2.1*  --   < > = values in this interval not displayed.  Liver Function Tests:  Recent Labs Lab 06/10/14 0430  06/11/14 0415  06/14/14 0551 06/14/14 1600 06/15/14 0526 06/15/14 1650 06/16/14 0248  AST 417*  --  360*  --   --   --   --   --   --   ALT 363*  --  380*  --   --   --   --   --   --   ALKPHOS 95  --  87  --   --   --   --   --   --   BILITOT 3.2*  --  3.0*  --   --   --   --   --   --   PROT 8.8*  --  7.9  --   --   --   --   --   --   ALBUMIN 3.4*  < > 3.0*  < > 2.9* 2.5* 2.7* 2.6* 2.7*  < > = values in this interval not displayed. No results for input(s): LIPASE, AMYLASE in the last 168 hours. No results for input(s): AMMONIA in the last 168 hours.  CBC:  Recent Labs Lab  06/12/14 0450 06/13/14 0449 06/14/14 0551 06/15/14 0526 06/16/14 0249  WBC 7.4 6.7 6.2 6.8 6.2  NEUTROABS 5.3 4.4 4.0 4.5  --   HGB 11.0* 10.0* 11.3* 11.0* 10.6*  HCT 35.0* 32.5* 37.1* 35.0* 34.1*  MCV 78.1 77.4* 77.6* 76.8* 78.9  PLT 134* 136* 139* 134* 130*    Cardiac Enzymes: No results for input(s): CKTOTAL, CKMB, CKMBINDEX, TROPONINI in the last 168 hours.  BNP: BNP (last 3 results)  Recent Labs  06/08/14 1040 06/12/14 0450  PROBNP 11335.0* 6716.0*     Other results:    Imaging: No results found.   Medications:     Scheduled Medications: . amiodarone  200 mg Oral Daily  . antiseptic oral rinse  7 mL Mouth Rinse BID  . calcitRIOL  0.25 mcg Oral Daily  . feeding supplement (NEPRO CARB STEADY)  237 mL Oral BID BM  . insulin aspart  2-6 Units Subcutaneous 6 times per day  . pantoprazole  40 mg Oral Daily  . sodium phosphate  Dextrose 5% IVPB  10 mmol Intravenous Q12H  . vancomycin  1,500 mg Intravenous Q24H    Infusions: . DOBUTamine 7 mcg/kg/min (06/16/14 0154)  . heparin 10,000 units/ 20 mL infusion syringe 600 Units/hr (06/16/14 0840)  . heparin 1,050 Units/hr (06/16/14 0501)  . dialysis replacement fluid (prismasate) 800 mL/hr at 06/16/14 0829  . dialysis replacement fluid (prismasate) 700 mL/hr at 06/16/14 1055  . dialysate (PRISMASATE) 2,000 mL/hr at 06/16/14 1055    PRN Medications: heparin, heparin, sodium chloride   Assessment:   1. Acute respiratory failure  2. A/c systolic HF due to NICM EF < 15%  3. Acute on chronic renal failure stage IV  4. Hyperkalemia  5. Cardiogenic shock  6. DM2  7. Cocaine abuse in remission 3-4 months  8. H/o gout  9. Supratherapeutic INR  10. LV thrombus  11. PAF on amio  12. H/o VT 13. Fever 11/1   Plan/Discussion:    Volume status improving. CVP coming down. Continue to pull fluid with CVVHD. Would continue CVVHD until we can get CVP in 8-10 range then we can stop and continue to await  resolution of ATN though I share Dr. Elza Rafter pessimism regarding renal recovery.   Weaning dobutamine is certainly a consideration but unfortunately I don't think he will tolerate it for long. In June of this year he had shock as well and required IABP and required dobutamine subsequently. Currently requiring very high dose dobutamine (now at 7 mcg/kg/min) to support CVVHD,  If kidneys don't recover, we can consider trying to wean dobutamine and try iHD but I am a pessimistic that this will be successful and Comfort Care may be only option.    F/u bcx remain negative. Utility of TEE seems low at this point pending renal recovery.    Jaimon Bugaj,MD 12:07 PM

## 2014-06-17 LAB — VANCOMYCIN, TROUGH: Vancomycin Tr: 21.6 ug/mL — ABNORMAL HIGH (ref 10.0–20.0)

## 2014-06-17 LAB — GLUCOSE, CAPILLARY
GLUCOSE-CAPILLARY: 105 mg/dL — AB (ref 70–99)
GLUCOSE-CAPILLARY: 212 mg/dL — AB (ref 70–99)
Glucose-Capillary: 115 mg/dL — ABNORMAL HIGH (ref 70–99)
Glucose-Capillary: 117 mg/dL — ABNORMAL HIGH (ref 70–99)
Glucose-Capillary: 126 mg/dL — ABNORMAL HIGH (ref 70–99)
Glucose-Capillary: 150 mg/dL — ABNORMAL HIGH (ref 70–99)

## 2014-06-17 LAB — CBC
HCT: 31.4 % — ABNORMAL LOW (ref 39.0–52.0)
HEMOGLOBIN: 9.9 g/dL — AB (ref 13.0–17.0)
MCH: 24.3 pg — AB (ref 26.0–34.0)
MCHC: 31.5 g/dL (ref 30.0–36.0)
MCV: 77.1 fL — AB (ref 78.0–100.0)
Platelets: 130 10*3/uL — ABNORMAL LOW (ref 150–400)
RBC: 4.07 MIL/uL — ABNORMAL LOW (ref 4.22–5.81)
RDW: 21.3 % — ABNORMAL HIGH (ref 11.5–15.5)
WBC: 6.2 10*3/uL (ref 4.0–10.5)

## 2014-06-17 LAB — RENAL FUNCTION PANEL
ALBUMIN: 2.6 g/dL — AB (ref 3.5–5.2)
ANION GAP: 14 (ref 5–15)
Albumin: 2.7 g/dL — ABNORMAL LOW (ref 3.5–5.2)
Anion gap: 12 (ref 5–15)
BUN: 10 mg/dL (ref 6–23)
BUN: 12 mg/dL (ref 6–23)
CALCIUM: 8.4 mg/dL (ref 8.4–10.5)
CHLORIDE: 94 meq/L — AB (ref 96–112)
CO2: 23 mEq/L (ref 19–32)
CO2: 24 meq/L (ref 19–32)
Calcium: 8.5 mg/dL (ref 8.4–10.5)
Chloride: 97 mEq/L (ref 96–112)
Creatinine, Ser: 1.99 mg/dL — ABNORMAL HIGH (ref 0.50–1.35)
Creatinine, Ser: 1.97 mg/dL — ABNORMAL HIGH (ref 0.50–1.35)
GFR calc non Af Amer: 33 mL/min — ABNORMAL LOW (ref >90)
GFR calc non Af Amer: 34 mL/min — ABNORMAL LOW (ref >90)
GFR, EST AFRICAN AMERICAN: 39 mL/min — AB (ref >90)
GFR, EST AFRICAN AMERICAN: 39 mL/min — AB (ref >90)
Glucose, Bld: 118 mg/dL — ABNORMAL HIGH (ref 70–99)
Glucose, Bld: 121 mg/dL — ABNORMAL HIGH (ref 70–99)
PHOSPHORUS: 2.6 mg/dL (ref 2.3–4.6)
Phosphorus: 2.1 mg/dL — ABNORMAL LOW (ref 2.3–4.6)
Potassium: 4.4 mEq/L (ref 3.7–5.3)
Potassium: 4.6 mEq/L (ref 3.7–5.3)
SODIUM: 134 meq/L — AB (ref 137–147)
Sodium: 130 mEq/L — ABNORMAL LOW (ref 137–147)

## 2014-06-17 LAB — PROTIME-INR
INR: 1.44 (ref 0.00–1.49)
PROTHROMBIN TIME: 17.6 s — AB (ref 11.6–15.2)

## 2014-06-17 LAB — MAGNESIUM: Magnesium: 2.6 mg/dL — ABNORMAL HIGH (ref 1.5–2.5)

## 2014-06-17 LAB — POCT ACTIVATED CLOTTING TIME
ACTIVATED CLOTTING TIME: 185 s
ACTIVATED CLOTTING TIME: 202 s
Activated Clotting Time: 191 seconds

## 2014-06-17 LAB — HEPARIN LEVEL (UNFRACTIONATED): Heparin Unfractionated: 0.41 IU/mL (ref 0.30–0.70)

## 2014-06-17 LAB — APTT: APTT: 113 s — AB (ref 24–37)

## 2014-06-17 MED ORDER — VANCOMYCIN HCL 10 G IV SOLR
1250.0000 mg | INTRAVENOUS | Status: DC
  Administered 2014-06-18 – 2014-06-20 (×3): 1250 mg via INTRAVENOUS
  Filled 2014-06-17 (×6): qty 1250

## 2014-06-17 NOTE — Progress Notes (Signed)
Patient ID: [name], male   DOB: [birth year], 63 y.o.   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 Maintained on dobutamine 225mcg/kg/min.   Past medical h/o is notable for CKD with baseline Cr 3.0, cocaine abuse, DM2, AF on amio, VT. He has not been candidate for advanced therapies due to cocaine abuse (quit 4 months ago) and renal failure. Started on dobutamine. [place].   On Oct 14 [facility]. CR was 3.3 (on 04/16/2014 Cr was 1.6 at [facility] on discharge) and potassium was low. Kcl was increased. Over past week increased dyspnea and edema. NYHA IIIB symptoms at baseline.   10/30 Admitted with rep failure with wide complex bradycardia. K 6.8 and Cr 4.6. Intubated and CVVHD started.  Extubated 11/1.  Continues on CVVHD.  Bcx + for bacillus. ID requesting line holiday.  Feels fine. Weight down 6 pounds. CVP now 14. Dobutamine down to 5.  SPB in 80-90s.  U/o < 10cc/hr.   Objective:   Weight Range:  Vital Signs:   Temp:  [97.7 F (36.5 C)-99 F (37.2 C)] 97.9 F (36.6 C) (11/08 0744) Pulse Rate:  [88-101] 101 (11/08 1000) Resp:  [10-29] 16 (11/08 1000) BP: (70-105)/(45-73) 105/69 mmHg (11/08 1000) SpO2:  [91 %-100 %] 99 % (11/08 1000) Weight:  [100.2 kg (220 lb 14.4 oz)] 100.2 kg (220 lb 14.4 oz) (11/08 0400) Last BM Date: 06/16/14  Weight change: Filed Weights   06/15/14 0500 06/16/14 0500 06/17/14 0400  Weight: 102.4 kg (225 lb 12 oz) 102.9 kg (226 lb 13.7 oz) 100.2 kg (220 lb 14.4 oz)    Intake/Output:   Intake/Output Summary (Last 24 hours) at 06/17/14 1125 Last data filed at 06/17/14 1000  Gross per 24 hour  Intake 1339.8 ml  Output   4008 ml  Net -2668.2 ml     Physical Exam: CVP 20 General: NAD  Awake in bed. Bair hugger in place HEENT: normal  Neck: supple. JVP to ear.  Carotids 2+ bilat; no bruits. No  lymphadenopathy or thryomegaly appreciated.  Cor: PMI laterally displaced. Regular rate & rhythm. 2/6 MR +s3  Lungs: + clear Abdomen: soft, nontender, nondistended. No hepatosplenomegaly. No bruits or masses. Good bowel sounds.  Extremities: no cyanosis, clubbing, rash, 1+ edema R and LLE to thighs  SCDs femoral trialysis catheter Neuro: alert & orientedx3, cranial nerves grossly intact. moves all 4 extremities w/o difficulty. Affect pleasant   Telemetry: SR 90s  Labs: Basic Metabolic Panel:  Recent Labs Lab 06/13/14 0449  06/14/14 0551  06/15/14 0526 06/15/14 1650 06/16/14 0248 06/16/14 0249 06/16/14 1815 06/17/14 0230  NA 139  < > 139  138  < > 137 134* 132*  --  131* 134*  K 3.6*  < > 4.2  4.1  < > 4.3 4.1 4.5  --  4.5 4.4  CL 100  < > 99  99  < > 99 97 94*  --  95* 97  CO2 26  < > 25  26  < > 25 26 24   --  23 23  GLUCOSE 105*  < > 119*  121*  < > 131* 190* 116*  --  109* 118*  BUN 10  < > 9  9  < > 9 7 7   --  10 10  CREATININE 1.53*  < >  1.48*  1.51*  < > 1.69* 1.36* 1.64*  --  2.01* 1.99*  CALCIUM 8.4  < > 8.9  8.8  < > 8.7 8.1* 8.6  --  8.4 8.4  MG 2.5  --  2.7*  --  2.8*  --   --  2.6*  --  2.6*  PHOS 2.6  < > 2.6  < > 1.8* 1.6* 2.1*  --  2.1* 2.6  < > = values in this interval not displayed.  Liver Function Tests:  Recent Labs Lab 06/11/14 0415  06/15/14 0526 06/15/14 1650 06/16/14 0248 06/16/14 1815 06/17/14 0230  AST 360*  --   --   --   --   --   --   ALT 380*  --   --   --   --   --   --   ALKPHOS 87  --   --   --   --   --   --   BILITOT 3.0*  --   --   --   --   --   --   PROT 7.9  --   --   --   --   --   --   ALBUMIN 3.0*  < > 2.7* 2.6* 2.7* 2.7* 2.6*  < > = values in this interval not displayed. No results for input(s): LIPASE, AMYLASE in the last 168 hours. No results for input(s): AMMONIA in the last 168 hours.  CBC:  Recent Labs Lab 06/12/14 0450 06/13/14 0449 06/14/14 0551 06/15/14 0526 06/16/14 0249 06/17/14 0230  WBC 7.4  6.7 6.2 6.8 6.2 6.2  NEUTROABS 5.3 4.4 4.0 4.5  --   --   HGB 11.0* 10.0* 11.3* 11.0* 10.6* 9.9*  HCT 35.0* 32.5* 37.1* 35.0* 34.1* 31.4*  MCV 78.1 77.4* 77.6* 76.8* 78.9 77.1*  PLT 134* 136* 139* 134* 130* 130*    Cardiac Enzymes: No results for input(s): CKTOTAL, CKMB, CKMBINDEX, TROPONINI in the last 168 hours.  BNP: BNP (last 3 results)  Recent Labs  06/08/14 1040 06/12/14 0450  PROBNP 11335.0* 6716.0*     Other results:    Imaging: No results found.   Medications:     Scheduled Medications: . amiodarone  200 mg Oral Daily  . antiseptic oral rinse  7 mL Mouth Rinse BID  . calcitRIOL  0.25 mcg Oral Daily  . feeding supplement (NEPRO CARB STEADY)  237 mL Oral BID BM  . insulin aspart  2-6 Units Subcutaneous 6 times per day  . pantoprazole  40 mg Oral Daily  . sodium phosphate  Dextrose 5% IVPB  10 mmol Intravenous Q12H  . vancomycin  1,500 mg Intravenous Q24H    Infusions: . DOBUTamine 5 mcg/kg/min (06/17/14 0400)  . heparin 10,000 units/ 20 mL infusion syringe 600 Units/hr (06/17/14 0000)  . heparin 1,050 Units/hr (06/17/14 0400)  . dialysis replacement fluid (prismasate) 800 mL/hr at 06/17/14 1041  . dialysis replacement fluid (prismasate) 700 mL/hr at 06/17/14 0951  . dialysate (PRISMASATE) 2,000 mL/hr at 06/17/14 1118    PRN Medications: heparin, heparin, sodium chloride   Assessment:   1. Acute respiratory failure  2. A/c systolic HF due to NICM EF < 15%  3. Acute on chronic renal failure stage IV  4. Hyperkalemia  5. Cardiogenic shock  6. DM2  7. Cocaine abuse in remission 3-4 months  8. H/o gout  9. Supratherapeutic INR  10. LV thrombus  11. PAF on amio  12. H/o VT 13.  Fever 11/1   Plan/Discussion:    Volume status improving. CVP coming down. Continue to pull fluid with CVVHD. Would continue CVVHD until we can get CVP in 8-10 range then we can stop and continue to await resolution of ATN though I share Dr. Elza Rafter pessimism  regarding renal recovery.   Weaning dobutamine is certainly a consideration but unfortunately I don't think he will tolerate it for long. In June of this year he had shock as well and required IABP and required dobutamine subsequently.   If kidneys don't recover, we can consider trying to wean dobutamine and try iHD but I am a pessimistic that this will be successful and Comfort Care may be only option.    F/u bcx remain negative. Utility of TEE seems low at this point pending renal recovery.    Larsen Zettel,MD 11:25 AM

## 2014-06-17 NOTE — Progress Notes (Signed)
ANTICOAGULATION CONSULT NOTE - Follow Up Consult  Pharmacy Consult for Heparin; Vancomycin Indication: atrial fibrillation, apical thrombus; Bacillus cereus bacteremia  No Known Allergies  Patient Measurements: Height: 6' (182.9 cm) Weight: 220 lb 14.4 oz (100.2 kg) IBW/kg (Calculated) : 77.6 Heparin Dosing Weight: 98 kg  Vital Signs: Temp: 97.9 F (36.6 C) (11/08 0744) Temp Source: Oral (11/08 0744) BP: 105/69 mmHg (11/08 1000) Pulse Rate: 101 (11/08 1000)  Labs:  Recent Labs  06/15/14 0526  06/16/14 0248 06/16/14 0249 06/16/14 1815 06/17/14 0230  HGB 11.0*  --   --  10.6*  --  9.9*  HCT 35.0*  --   --  34.1*  --  31.4*  PLT 134*  --   --  130*  --  130*  APTT 103*  --  118*  --   --  113*  LABPROT 18.7*  --  17.6*  --   --  17.6*  INR 1.55*  --  1.43  --   --  1.44  HEPARINUNFRC 0.40  --  0.39  --   --  0.41  CREATININE 1.69*  < > 1.64*  --  2.01* 1.99*  < > = values in this interval not displayed.  Estimated Creatinine Clearance: 45.3 mL/min (by C-G formula based on Cr of 1.99).   Medications:  Infusions:  . DOBUTamine 5 mcg/kg/min (06/17/14 0400)  . heparin 10,000 units/ 20 mL infusion syringe 600 Units/hr (06/17/14 0000)  . heparin 1,050 Units/hr (06/17/14 0400)  . dialysis replacement fluid (prismasate) 800 mL/hr at 06/17/14 1041  . dialysis replacement fluid (prismasate) 700 mL/hr at 06/17/14 0951  . dialysate (PRISMASATE) 2,000 mL/hr at 06/17/14 1118   Vancomycin trough 21.6  Assessment: 63 year old male on anticoagulation with heparin for atrial fibrillation and apical thrombus.  Coumadin remains on hold.  His heparin remains therapeutic.  No bleeding noted, Hgb, and PLTC stable.  He is on Day #8 of Vancomycin for Bacillus cereus bacteremia.  His Vancomycin trough is slightly supratherapeutic and I expect he will continue to accumulate while on CRRT.  Goal of Therapy:  Heparin level 0.3-0.7 units/ml Monitor platelets by anticoagulation protocol:  Yes  Vancomycin trough 15-20 mcg/ml   Plan:  Continue Heparin at 1050 units/hr Daily heparin level and CBC Decrease Vancomycin to 1250mg  IV q24h Check Vancomycin trough at steady state Follow for renal replacement plans and tolerance  Estella Husk, Pharm.D., BCPS, AAHIVP Clinical Pharmacist Phone: [phone] or [phone] 06/17/2014, 11:26 AM

## 2014-06-17 NOTE — Progress Notes (Signed)
[facility] Rounding Note  Subjective: Continues to be without complaint. Denies CP or SOB. Remains on CRRT  Objective: Vital signs in last 24 hours: Temp:  [97.7 F (36.5 C)-99.8 F (37.7 C)] 98.6 F (37 C) (11/08 0339) Pulse Rate:  [88-102] 94 (11/08 0600) Resp:  [10-29] 27 (11/08 0600) BP: (70-100)/(45-69) 99/66 mmHg (11/08 0600) SpO2:  [91 %-100 %] 100 % (11/08 0600) Weight:  [220 lb 14.4 oz (100.2 kg)] 220 lb 14.4 oz (100.2 kg) (11/08 0400) Weight change: -5 lb 15.2 oz (-2.7 kg)  Intake/Output from previous day: 11/07 0701 - 11/08 0700 In: 1428.2 [P.O.:150; I.V.:444.2; IV Piggyback:754] Out: 3945 [Urine:30]  Down -2.6 L in last 24 hrs; Net - 17 L since admit  Weight Trending 10/30  112.4 kg 10/31  114.1 kg 11/1    113.5 kg 11/2    111.2 kg     11/3     108.5 kg   11/4     106.8 kg   11/5     106.4 kg 11/6    102.4 kg 11/7    102.9 kg 11/8    100.2 kg  Total I/O In: 656.8 [P.O.:150; I.V.:204.8; Other:50; IV Piggyback:252] Out: 1893 [Urine:20; Other:1873] Physical Examination: BP 99/66 mmHg  Pulse 94  Temp(Src) 98.6 F (37 C) (Oral)  Resp 27  Ht 6' (1.829 m)  Wt 220 lb 14.4 oz (100.2 kg)  BMI 29.95 kg/m2  SpO2 100%  CVP = 14   Awake; A&Ox3 RRR; S1S2 + s3 w/ 2/6 systolic murmur Lungs CTAB, no crackles heard 1+ edema bilat notable at posterior thigh and hips - somewhat less PICC in right arm, trialysis catheter in right groin (10/30)   Recent Labs  06/16/14 0249 06/17/14 0230  WBC 6.2 6.2  HGB 10.6* 9.9*  HCT 34.1* 31.4*  PLT 130* 130*    Recent Labs  06/16/14 1815 06/17/14 0230  NA 131* 134*  K 4.5 4.4  CL 95* 97  CO2 23 23  GLUCOSE 109* 118*  BUN 10 10  CREATININE 2.01* 1.99*  CALCIUM 8.4 8.4    Recent Labs  06/16/14 0249 06/16/14 1815 06/17/14 0230  PHOS  --  2.1* 2.6  MG 2.6*  --  2.6*     Results for Timberman, [name] (MRN [id number]) as of 06/11/2014 07:18  Ref. Range 06/09/2014 07:30  Iron Latest  Range: 42-135 ug/dL 21 (L)  UIBC Latest Range: 125-400 ug/dL 829320  TIBC Latest Range: 215-435 ug/dL 562341  Saturation Ratios Latest Range: 20-55 % 6 (L)    Studies/Results: No results found. Scheduled Medications . amiodarone  200 mg Oral Daily  . antiseptic oral rinse  7 mL Mouth Rinse BID  . calcitRIOL  0.25 mcg Oral Daily  . feeding supplement (NEPRO CARB STEADY)  237 mL Oral BID BM  . insulin aspart  2-6 Units Subcutaneous 6 times per day  . pantoprazole  40 mg Oral Daily  . sodium phosphate  Dextrose 5% IVPB  10 mmol Intravenous Q12H  . vancomycin  1,500 mg Intravenous Q24H   Infusion/other Medications . DOBUTamine 5 mcg/kg/min (06/17/14 0400)  . heparin 10,000 units/ 20 mL infusion syringe 600 Units/hr (06/17/14 0000)  . heparin 1,050 Units/hr (06/17/14 0400)  . dialysis replacement fluid (prismasate) 800 mL/hr at 06/16/14 2127  . dialysis replacement fluid (prismasate) 700 mL/hr at 06/17/14 0221  . 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   He was admitted 10/30 with respiratory failure, worsening heart failure, creatinine of 4.6, hyperkalemia (6.8) and wide complex bradycardia. CRRT initiated 10/30 for volume control, renal failure. Has BC+ 11/1 for Bacillus on Vanc (ID following). He is not considered a candidate for outpt HD given need for chronic IV inotropes (and Dr. Teressa Lower skeptical that he could survive without the dobutamine).    Assessment/Plan:  AKI on CKD4  Remains oliguric. Unclear how much recovery he will get. CVP at 14 (11/8) and volume status improving. Pt is not candidate for heart transplant or pump therapy. Would not be candidate for outpt HD given need for outpt dobutamine for past several  months. If unable to recover renal fcn or transition to IHD and come off inotropes will need to discuss palliative options.  Dr Kittie Plater to decide when volume status is improved sufficiently to try off CRRT with goal CVP of 8-10.  Continue CRRT 4K bath; Rate net neg 100 to 150 hour  to facilitate fluid removal and hold off on Catheter Holiday until fluid status has improved. (Dr. Kandis Mannan preference)  Plan for Catheter holiday when we get to a point to attempt IHD and see if renal fcn will improve (I doubt it will...).   Question whether the Dobutamine could be stopped once dry weight is achieved and be able to tolerate IHD?- Dr Gala Romney doubts this would be possible but could attempt.  CM with acute on chronic systolic heart failure/NICM  EF < 15% - on dobutamine. Per cardiology.   Hypophosphatemia- 2.6 on 11/8. - Continue sodium phosphate 10 mmol q12 hours and reassess dose tomorrow AM  H/O VT  PAF: INR 1.43 (11/4) IV heparin per Pharm  Anemia stable. Fe deficient but withholding IV iron in setting of bacillus bacteremia  PTH: Elevated at 168: Calcitriol (0.25 mcg) PO qd  Bacillus bacteremia (11/1). ID following. On vanco (11/1>>); zosyn stopped (11/1>>11/3)  Needs Catheter Holiday - Will continue with CRRT for fluid removal as he is currently stable and plan to remove line when we attempt to transition to IHD  Cocaine abuse - none for 4 months     LOS: 9 days   Jacquelin Hawking, MD PGY-2, [facility] 06/17/2014, 6:54 AM   I have seen and examined this patient and agree with plan and assessment in the above note by Dr. Caleb Popp with highlighted additions. Ander Wamser B,MD 06/17/2014 9:24 AM

## 2014-06-18 LAB — RENAL FUNCTION PANEL
ALBUMIN: 2.7 g/dL — AB (ref 3.5–5.2)
ANION GAP: 13 (ref 5–15)
Albumin: 2.8 g/dL — ABNORMAL LOW (ref 3.5–5.2)
Anion gap: 14 (ref 5–15)
BUN: 11 mg/dL (ref 6–23)
BUN: 9 mg/dL (ref 6–23)
CALCIUM: 8.5 mg/dL (ref 8.4–10.5)
CO2: 24 meq/L (ref 19–32)
CO2: 25 meq/L (ref 19–32)
CREATININE: 1.87 mg/dL — AB (ref 0.50–1.35)
CREATININE: 1.5 mg/dL — AB (ref 0.50–1.35)
Calcium: 8.2 mg/dL — ABNORMAL LOW (ref 8.4–10.5)
Chloride: 96 mEq/L (ref 96–112)
Chloride: 96 mEq/L (ref 96–112)
GFR calc non Af Amer: 36 mL/min — ABNORMAL LOW (ref >90)
GFR calc non Af Amer: 47 mL/min — ABNORMAL LOW (ref >90)
GFR, EST AFRICAN AMERICAN: 42 mL/min — AB (ref >90)
GFR, EST AFRICAN AMERICAN: 55 mL/min — AB (ref >90)
GLUCOSE: 109 mg/dL — AB (ref 70–99)
Glucose, Bld: 209 mg/dL — ABNORMAL HIGH (ref 70–99)
PHOSPHORUS: 2.8 mg/dL (ref 2.3–4.6)
POTASSIUM: 4.4 meq/L (ref 3.7–5.3)
Phosphorus: 1.8 mg/dL — ABNORMAL LOW (ref 2.3–4.6)
Potassium: 4.4 mEq/L (ref 3.7–5.3)
SODIUM: 134 meq/L — AB (ref 137–147)
Sodium: 134 mEq/L — ABNORMAL LOW (ref 137–147)

## 2014-06-18 LAB — POCT ACTIVATED CLOTTING TIME
ACTIVATED CLOTTING TIME: 180 s
ACTIVATED CLOTTING TIME: 174 s
ACTIVATED CLOTTING TIME: 185 s
ACTIVATED CLOTTING TIME: 180 s
ACTIVATED CLOTTING TIME: 185 s
ACTIVATED CLOTTING TIME: 174 s
Activated Clotting Time: 174 seconds
Activated Clotting Time: 174 seconds
Activated Clotting Time: 174 seconds
Activated Clotting Time: 180 seconds
Activated Clotting Time: 180 seconds
Activated Clotting Time: 185 seconds
Activated Clotting Time: 197 seconds
Activated Clotting Time: 197 seconds

## 2014-06-18 LAB — GLUCOSE, CAPILLARY
GLUCOSE-CAPILLARY: 112 mg/dL — AB (ref 70–99)
GLUCOSE-CAPILLARY: 214 mg/dL — AB (ref 70–99)
GLUCOSE-CAPILLARY: 258 mg/dL — AB (ref 70–99)
Glucose-Capillary: 113 mg/dL — ABNORMAL HIGH (ref 70–99)
Glucose-Capillary: 114 mg/dL — ABNORMAL HIGH (ref 70–99)
Glucose-Capillary: 291 mg/dL — ABNORMAL HIGH (ref 70–99)
Glucose-Capillary: 98 mg/dL (ref 70–99)

## 2014-06-18 LAB — CULTURE, BLOOD (ROUTINE X 2)
CULTURE: NO GROWTH
Culture: NO GROWTH

## 2014-06-18 LAB — CBC
HCT: 31.5 % — ABNORMAL LOW (ref 39.0–52.0)
HEMOGLOBIN: 9.9 g/dL — AB (ref 13.0–17.0)
MCH: 24.3 pg — AB (ref 26.0–34.0)
MCHC: 31.4 g/dL (ref 30.0–36.0)
MCV: 77.4 fL — AB (ref 78.0–100.0)
Platelets: 169 10*3/uL (ref 150–400)
RBC: 4.07 MIL/uL — ABNORMAL LOW (ref 4.22–5.81)
RDW: 21.6 % — ABNORMAL HIGH (ref 11.5–15.5)
WBC: 6.4 10*3/uL (ref 4.0–10.5)

## 2014-06-18 LAB — APTT: aPTT: 102 seconds — ABNORMAL HIGH (ref 24–37)

## 2014-06-18 LAB — PROTIME-INR
INR: 1.36 (ref 0.00–1.49)
PROTHROMBIN TIME: 17 s — AB (ref 11.6–15.2)

## 2014-06-18 LAB — HEPARIN LEVEL (UNFRACTIONATED): Heparin Unfractionated: 0.36 IU/mL (ref 0.30–0.70)

## 2014-06-18 LAB — MAGNESIUM: Magnesium: 2.5 mg/dL (ref 1.5–2.5)

## 2014-06-18 MED ORDER — INSULIN ASPART 100 UNIT/ML ~~LOC~~ SOLN
0.0000 [IU] | Freq: Three times a day (TID) | SUBCUTANEOUS | Status: DC
  Administered 2014-06-18: 5 [IU] via SUBCUTANEOUS
  Administered 2014-06-19: 1 [IU] via SUBCUTANEOUS
  Administered 2014-06-19: 2 [IU] via SUBCUTANEOUS
  Administered 2014-06-19: 3 [IU] via SUBCUTANEOUS
  Administered 2014-06-20 – 2014-06-21 (×4): 2 [IU] via SUBCUTANEOUS
  Administered 2014-06-21: 1 [IU] via SUBCUTANEOUS
  Administered 2014-06-21 – 2014-06-22 (×2): 2 [IU] via SUBCUTANEOUS
  Administered 2014-06-22 – 2014-06-23 (×2): 1 [IU] via SUBCUTANEOUS
  Administered 2014-06-23: 2 [IU] via SUBCUTANEOUS
  Administered 2014-06-23: 3 [IU] via SUBCUTANEOUS
  Administered 2014-06-24: 2 [IU] via SUBCUTANEOUS
  Administered 2014-06-24: 5 [IU] via SUBCUTANEOUS
  Administered 2014-06-24: 1 [IU] via SUBCUTANEOUS
  Administered 2014-06-25: 5 [IU] via SUBCUTANEOUS
  Administered 2014-06-26 – 2014-06-27 (×3): 1 [IU] via SUBCUTANEOUS
  Administered 2014-06-28: 3 [IU] via SUBCUTANEOUS

## 2014-06-18 NOTE — Telephone Encounter (Signed)
OPEN IN ERROR 

## 2014-06-18 NOTE — Progress Notes (Addendum)
ANTICOAGULATION CONSULT NOTE - Follow Up Consult  Pharmacy Consult for Heparin Indication: atrial fibrillation, apical thrombus  No Known Allergies  Patient Measurements: Height: 6' (182.9 cm) Weight: 218 lb 0.6 oz (98.9 kg) IBW/kg (Calculated) : 77.6 Heparin Dosing Weight: 98 kg  Vital Signs: Temp: 97.7 F (36.5 C) (11/09 1155) Temp Source: Oral (11/09 1155) BP: 84/61 mmHg (11/09 1100) Pulse Rate: 99 (11/09 1100)  Labs:  Recent Labs  06/16/14 0248  06/16/14 0249  06/17/14 0230 06/17/14 1930 06/18/14 0238  HGB  --   < > 10.6*  --  9.9*  --  9.9*  HCT  --   --  34.1*  --  31.4*  --  31.5*  PLT  --   --  130*  --  130*  --  169  APTT 118*  --   --   --  113*  --  102*  LABPROT 17.6*  --   --   --  17.6*  --  17.0*  INR 1.43  --   --   --  1.44  --  1.36  HEPARINUNFRC 0.39  --   --   --  0.41  --  0.36  CREATININE 1.64*  --   --   < > 1.99* 1.97* 1.87*  < > = values in this interval not displayed.  Estimated Creatinine Clearance: 48 mL/min (by C-G formula based on Cr of 1.87).   Medications:  Infusions:  . DOBUTamine 5 mcg/kg/min (06/18/14 0800)  . heparin 10,000 units/ 20 mL infusion syringe 800 Units/hr (06/18/14 1200)  . heparin 1,050 Units/hr (06/18/14 0300)  . dialysis replacement fluid (prismasate) 800 mL/hr at 06/18/14 1239  . dialysis replacement fluid (prismasate) 700 mL/hr at 06/18/14 0759  . dialysate (PRISMASATE) 2,000 mL/hr at 06/18/14 1027    Assessment: 63 year old male on anticoagulation with heparin for atrial fibrillation and apical thrombus.  Coumadin remains on hold.  His heparin remains therapeutic.  No bleeding noted, Hgb, and PLTC improved.  Goal of Therapy:  Heparin level 0.3-0.7 units/ml Monitor platelets by anticoagulation protocol: Yes    Plan:  Continue Heparin at 1050 units/hr Daily heparin level and CBC  Link Snuffer, PharmD, BCPS Clinical Pharmacist [phone]  06/18/2014, 1:03 PM

## 2014-06-18 NOTE — Progress Notes (Signed)
[facility] Rounding Note  Subjective: Continues to be without complaint. Denies CP or SOB. Remains on CRRT- looks good  Objective: Vital signs in last 24 hours: Temp:  [97.3 F (36.3 C)-98.8 F (37.1 C)] 98.8 F (37.1 C) (11/09 0743) Pulse Rate:  [88-116] 95 (11/09 0700) Resp:  [8-25] 19 (11/09 0700) BP: (72-105)/(51-73) 85/53 mmHg (11/09 0700) SpO2:  [93 %-100 %] 93 % (11/09 0700) Weight:  [218 lb 0.6 oz (98.9 kg)] 218 lb 0.6 oz (98.9 kg) (11/09 0400) Weight change: -2 lb 13.9 oz (-1.3 kg)  Intake/Output from previous day: 11/08 0701 - 11/09 0700 In: 1595.7 [P.O.:360; I.V.:425.7; IV Piggyback:695] Out: 4860   Down -2.6 L in last 24 hrs; Net - 17 L since admit  Weight Trending 10/30  112.4 kg 10/31  114.1 kg 11/1    113.5 kg 11/2    111.2 kg     11/3     108.5 kg   11/4     106.8 kg   11/5     106.4 kg 11/6    102.4 kg 11/7    102.9 kg 11/8    100.2 kg 11/9     98.9 kg    Physical Examination: BP 85/53 mmHg  Pulse 95  Temp(Src) 98.8 F (37.1 C) (Oral)  Resp 19  Ht 6' (1.829 m)  Wt 218 lb 0.6 oz (98.9 kg)  BMI 29.56 kg/m2  SpO2 93%  CVP = 8- then 14 Awake; A&Ox3 RRR; S1S2 + s3 w/ 2/6 systolic murmur Lungs CTAB, no crackles heard 1+ edema bilat notable at posterior hips and calves somewhat less PICC in right arm, trialysis catheter in right groin (10/30)   Recent Labs  06/17/14 0230 06/18/14 0238  WBC 6.2 6.4  HGB 9.9* 9.9*  HCT 31.4* 31.5*  PLT 130* 169    Recent Labs  06/17/14 1930 06/18/14 0238  NA 130* 134*  K 4.6 4.4  CL 94* 96  CO2 24 24  GLUCOSE 121* 109*  BUN 12 11  CREATININE 1.97* 1.87*  CALCIUM 8.5 8.5    Recent Labs  06/17/14 0230 06/17/14 1930 06/18/14 0238  PHOS 2.6 2.1* 2.8  MG 2.6*  --  2.5     Results for Rovito, Iantha FallenKENNETH (MRN [id number]) as of 06/11/2014 07:18  Ref. Range 06/09/2014 07:30  Iron Latest Range: 42-135 ug/dL 21 (L)  UIBC Latest Range: 125-400 ug/dL 409320  TIBC Latest Range: 215-435  ug/dL 811341  Saturation Ratios Latest Range: 20-55 % 6 (L)    Studies/Results: No results found. Scheduled Medications . amiodarone  200 mg Oral Daily  . antiseptic oral rinse  7 mL Mouth Rinse BID  . calcitRIOL  0.25 mcg Oral Daily  . feeding supplement (NEPRO CARB STEADY)  237 mL Oral BID BM  . insulin aspart  2-6 Units Subcutaneous 6 times per day  . pantoprazole  40 mg Oral Daily  . sodium phosphate  Dextrose 5% IVPB  10 mmol Intravenous Q12H  . vancomycin  1,250 mg Intravenous Q24H   Infusion/other Medications . DOBUTamine 4 mcg/kg/min (06/18/14 0400)  . heparin 10,000 units/ 20 mL infusion syringe 650 Units/hr (06/17/14 1832)  . heparin 1,050 Units/hr (06/18/14 0300)  . dialysis replacement fluid (prismasate) 800 mL/hr at 06/18/14 0639  . dialysis replacement fluid (prismasate) 700 mL/hr at 06/18/14 0015  . 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   He was admitted 10/30 with respiratory failure, worsening heart failure, creatinine of 4.6, hyperkalemia (6.8) and wide complex bradycardia. CRRT initiated 10/30 for volume control, renal failure. Has BC+ 11/1 for Bacillus on Vanc (ID following). He is not considered a candidate for outpt HD given need for chronic IV inotropes (and Dr. Teressa Lower skeptical that he could survive without the dobutamine).    Assessment/Plan:  AKI on CKD4  Remains oliguric. Unclear how much recovery he will get. CVP at 14 (11/9) and volume status improving. Pt is not candidate for heart transplant or pump therapy. Would not be candidate for outpt HD given need for outpt dobutamine for past several months- although I am not sure if this is the case only because it has never been done- PD could be  an option as well. If unable to recover renal fcn or transition to IHD and come off inotropes will need to discuss palliative options.  Dr Kittie Plater to decide when volume status is improved sufficiently to try off CRRT with goal CVP of 8-10. Last was 14  Continue CRRT 4K bath; Rate net neg 100 to 150 hour  to facilitate fluid removal and hold off on Catheter Holiday until fluid status has improved. (Dr. Kandis Mannan preference)  Plan for Catheter holiday when we get to a point to attempt IHD and see if renal fcn will improve (I doubt it will...).   Question whether the Dobutamine could be stopped once dry weight is achieved and be able to tolerate IHD?- Dr Gala Romney doubts this would be possible but could attempt.  CM with acute on chronic systolic heart failure/NICM  EF < 15% - on dobutamine. Per cardiology.   Hypophosphatemia- Resolved 2.8 on 11/9. - Continue sodium phosphate 10 mmol q12 hours and reassess dose tomorrow AM  H/O VT  PAF: INR 1.43 (11/4) IV heparin per Pharm  Anemia stable. Fe deficient but withholding IV iron in setting of bacillus bacteremia  PTH: Elevated at 168: Calcitriol (0.25 mcg) PO qd  Bacillus bacteremia (11/1). ID following. On vanco (11/1>>); zosyn stopped (11/1>>11/3)  Needs Catheter Holiday - Will continue with CRRT for fluid removal as he is currently stable and plan to remove line when we attempt to transition to IHD  Cocaine abuse - none for 4 months     LOS: 10 days   Wenda Low, MD PGY-2, [facility] 06/18/2014, 7:56 AM   Patient seen and examined, agree with above note with above modifications. Stable and looks great on CRRT.  CVP not quite at goal.  Plan still to stop CRRT when we feel able.  Then I think we have more options than what have been discussed, possible PD or might tolerate intermittent HD with dobutamine ?  Annie Sable, MD 06/18/2014

## 2014-06-18 NOTE — Progress Notes (Signed)
[facility]      Subjective: Feels better today   Antibiotics:  Anti-infectives    Start     Dose/Rate Route Frequency Ordered Stop   06/18/14 1000  vancomycin (VANCOCIN) 1,250 mg in sodium chloride 0.9 % 150 mL IVPB     1,250 mg100 mL/hr over 90 Minutes Intravenous Every 24 hours 06/17/14 1134     06/14/14 0800  vancomycin (VANCOCIN) 1,500 mg in sodium chloride 0.9 % 250 mL IVPB  Status:  Discontinued     1,500 mg125 mL/hr over 120 Minutes Intravenous Every 24 hours 06/13/14 1528 06/17/14 1134   06/10/14 1000  piperacillin-tazobactam (ZOSYN) IVPB 3.375 g  Status:  Discontinued     3.375 g100 mL/hr over 30 Minutes Intravenous Every 6 hours 06/10/14 0918 06/12/14 1318   06/10/14 1000  vancomycin (VANCOCIN) 1,250 mg in sodium chloride 0.9 % 250 mL IVPB  Status:  Discontinued     1,250 mg166.7 mL/hr over 90 Minutes Intravenous Every 24 hours 06/10/14 0918 06/13/14 1528      Medications: Scheduled Meds: . amiodarone  200 mg Oral Daily  . antiseptic oral rinse  7 mL Mouth Rinse BID  . calcitRIOL  0.25 mcg Oral Daily  . feeding supplement (NEPRO CARB STEADY)  237 mL Oral BID BM  . insulin aspart  0-9 Units Subcutaneous TID WC  . pantoprazole  40 mg Oral Daily  . sodium phosphate  Dextrose 5% IVPB  10 mmol Intravenous Q12H  . vancomycin  1,250 mg Intravenous Q24H   Continuous Infusions: . DOBUTamine 5 mcg/kg/min (06/18/14 1400)  . heparin 10,000 units/ 20 mL infusion syringe 1,100 Units/hr (06/18/14 1500)  . heparin 1,050 Units/hr (06/18/14 0300)  . dialysis replacement fluid (prismasate) 800 mL/hr at 06/18/14 1239  . dialysis replacement fluid (prismasate) 700 mL/hr at 06/18/14 1511  . dialysate (PRISMASATE) 2,000 mL/hr at 06/18/14 1304   PRN Meds:.heparin, heparin, sodium chloride    Objective: Weight change: -2 lb 13.9 oz (-1.3 kg)  Intake/Output Summary (Last 24 hours) at 06/18/14 1540 Last data filed at 06/18/14 1500  Gross per 24 hour    Intake 2122.7 ml  Output   4917 ml  Net -2794.3 ml   Blood pressure 78/48, pulse 96, temperature 97.7 F (36.5 C), temperature source Oral, resp. rate 24, height 6' (1.829 m), weight 218 lb 0.6 oz (98.9 kg), SpO2 97 %. Temp:  [97.3 F (36.3 C)-98.8 F (37.1 C)] 97.7 F (36.5 C) (11/09 1155) Pulse Rate:  [88-99] 96 (11/09 1300) Resp:  [8-25] 24 (11/09 1300) BP: (72-109)/(48-73) 78/48 mmHg (11/09 1300) SpO2:  [93 %-100 %] 97 % (11/09 1300) Weight:  [218 lb 0.6 oz (98.9 kg)] 218 lb 0.6 oz (98.9 kg) (11/09 0400)  Physical Exam: General: Alert and awake, oriented x3, under a cooling blanket HEENT: anicteric sclera,  EOMI, oropharynx clear and without exudate CVS tachycardic raterate, 2/6 systolic murmur at PMI Chest: clear to auscultation bilaterally anteriorly Abdomen: soft nontender,distended, normal bowel sounds, Extremities: 2+ edema noted bilaterally Neuro: nonfocal, strength and sensation intact Skin: PICC line intact dialysis line in place  CBC:  CBC Latest Ref Rng 06/18/2014 06/17/2014 06/16/2014  WBC 4.0 - 10.5 K/uL 6.4 6.2 6.2  Hemoglobin 13.0 - 17.0 g/dL 1.6(X9.9(L) 0.9(U9.9(L) 10.6(L)  Hematocrit 39.0 - 52.0 % 31.5(L) 31.4(L) 34.1(L)  Platelets 150 - 400 K/uL 169 130(L) 130(L)      BMET  Recent Labs  06/17/14 1930 06/18/14 0238  NA 130* 134*  K  4.6 4.4  CL 94* 96  CO2 24 24  GLUCOSE 121* 109*  BUN 12 11  CREATININE 1.97* 1.87*  CALCIUM 8.5 8.5     Liver Panel   Recent Labs  06/17/14 1930 06/18/14 0238  ALBUMIN 2.7* 2.8*       Sedimentation Rate No results for input(s): ESRSEDRATE in the last 72 hours. C-Reactive Protein No results for input(s): CRP in the last 72 hours.  Micro Results: Recent Results (from the past 720 hour(s))  MRSA PCR Screening     Status: None   Collection Time: 06/08/14  1:16 PM  Result Value Ref Range Status   MRSA by PCR NEGATIVE NEGATIVE Final    Comment:        The GeneXpert MRSA Assay (FDA approved for NASAL  specimens only), is one component of a comprehensive MRSA colonization surveillance program. It is not intended to diagnose MRSA infection nor to guide or monitor treatment for MRSA infections.  Urine culture     Status: None   Collection Time: 06/09/14 10:32 AM  Result Value Ref Range Status   Specimen Description URINE, CATHETERIZED  Final   Special Requests Immunocompromised  Final   Culture  Setup Time   Final    06/09/2014 20:50 Performed at [facility]    Colony Count NO GROWTH Performed at [facility]   Final   Culture NO GROWTH Performed at [facility]   Final   Report Status 06/10/2014 FINAL  Final  Culture, blood (routine x 2)     Status: None   Collection Time: 06/10/14  3:39 AM  Result Value Ref Range Status   Specimen Description BLOOD LEFT HAND  Final   Special Requests BOTTLES DRAWN AEROBIC ONLY 4CC  Final   Culture  Setup Time   Final    06/10/2014 17:58 Performed at [facility]    Culture   Final    BACILLUS SPECIES Note: Standardized susceptibility testing for this organism is not available. Note: Gram Stain Report Called to,Read Back By and Verified With: BRYCE SHEPARD @ 3:58AM 11.2.15 BY DESIS Performed at [facility]    Report Status 06/12/2014 FINAL  Final  Culture, blood (routine x 2)     Status: None   Collection Time: 06/10/14  3:52 AM  Result Value Ref Range Status   Specimen Description BLOOD LEFT HAND  Final   Special Requests BOTTLES DRAWN AEROBIC ONLY 8CC  Final   Culture  Setup Time   Final    06/10/2014 17:58 Performed at [facility]    Culture   Final    BACILLUS SPECIES Note: Standardized susceptibility testing for this organism is not available. Note: Gram Stain Report Called to,Read Back By and Verified With: BRYCE SHEPARD @ 3:58AM 11.2.15 BY DESIS Culture results may be compromised due to an excessive volume of blood received in culture bottles. Performed at [facility]    Report Status 06/12/2014 FINAL  Final  Culture, blood (routine x 2)     Status: None   Collection Time: 06/12/14  1:54 PM  Result Value Ref Range Status   Specimen Description BLOOD LEFT HAND  Final   Special Requests   Final    BOTTLES DRAWN AEROBIC AND ANAEROBIC BLUE 10CC RED 5CC   Culture  Setup Time   Final    06/12/2014 20:35 Performed at [facility]    Culture   Final    NO GROWTH 5 DAYS  Performed at [facility]    Report Status 06/18/2014 FINAL  Final  Culture, blood (routine x 2)     Status: None   Collection Time: 06/12/14  2:05 PM  Result Value Ref Range Status   Specimen Description BLOOD LEFT ARM  Final   Special Requests BOTTLES DRAWN AEROBIC AND ANAEROBIC 10CC  Final   Culture  Setup Time   Final    06/12/2014 20:35 Performed at [facility]    Culture   Final    NO GROWTH 5 DAYS Performed at [facility]    Report Status 06/18/2014 FINAL  Final    Studies/Results: No results found.    Assessment/Plan:  Active Problems:   Acute respiratory failure   Cardiogenic shock   Acute on chronic systolic congestive heart failure   Acute on chronic renal failure   Hyperkalemia   Respiratory failure   Bacteremia associated with intravascular line   Blood poisoning   Acute respiratory failure with hypoxia   AKI (acute kidney injury)   Screen for STD (sexually transmitted disease)   Pulmonary edema    [name] is a 63 y.o. male with PICC line at home with dobutamine infusion now with admission for CHF, renal failure, hyperkalemia, bradycardia, fevers and found to be bacteremic with Bacillus (likely cereus)  #1 Bacillus Cereus bacteremia: likely due to his improper non sterile manipulation of his line when he was changing infusion site. 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 Lab does not speciate beyond saying  this non anthrax Bacillus and an aerobe, no sensis available  -- narrowed to vancomycin, given resistance to penicillin found with this organism' -- Cardiology and Nephrology to try to get all lines out once sufficient volume is removed via HD --I do not feel strongly that patient must have TEE --AFter we have given him a "line holiday" of 48 hours would then give him vancomycin for 2 weeks with target trough of 15-20    LOS: 10 days   [name] 06/18/2014, 3:40 PM

## 2014-06-18 NOTE — Progress Notes (Signed)
Patient ID: [name], male   DOB: 08-25-1950, 63 y.o.   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 Maintained on dobutamine 37mcg/kg/min.   Past medical h/o is notable for CKD with baseline Cr 3.0, cocaine abuse, DM2, AF on amio, VT. He has not been candidate for advanced therapies due to cocaine abuse (quit 4 months ago) and renal failure. Started on dobutamine. Moved to [place] to try to stay off cocaine.   On Oct 14 seen at [facility]. CR was 3.3 (on 04/16/2014 Cr was 1.6 at [facility] on discharge) and potassium was low. Kcl was increased. Over past week increased dyspnea and edema. NYHA IIIB symptoms at baseline.   10/30 Admitted with rep failure with wide complex bradycardia. K 6.8 and Cr 4.6. Intubated and CVVHD started.  Extubated 11/1.  Continues on CVVHD.  Bcx + for bacillus. ID requesting line holiday.  Feels fine. Weight down another 2 pounds. CVP now 12. Dobutamine down to 5.  SPB in 80-100.  U/o < 10cc/hr.   Objective:   Weight Range:  Vital Signs:   Temp:  [97.3 F (36.3 C)-98.8 F (37.1 C)] 98.8 F (37.1 C) (11/09 0743) Pulse Rate:  [88-116] 99 (11/09 0900) Resp:  [8-25] 15 (11/09 0900) BP: (72-109)/(51-73) 109/73 mmHg (11/09 0900) SpO2:  [93 %-100 %] 98 % (11/09 0900) Weight:  [98.9 kg (218 lb 0.6 oz)] 98.9 kg (218 lb 0.6 oz) (11/09 0400) Last BM Date: 06/15/14  Weight change: Filed Weights   06/16/14 0500 06/17/14 0400 06/18/14 0400  Weight: 102.9 kg (226 lb 13.7 oz) 100.2 kg (220 lb 14.4 oz) 98.9 kg (218 lb 0.6 oz)    Intake/Output:   Intake/Output Summary (Last 24 hours) at 06/18/14 0949 Last data filed at 06/18/14 0900  Gross per 24 hour  Intake 1768.1 ml  Output   4853 ml  Net -3084.9 ml     Physical Exam: CVP 20 General: NAD  Awake in bed. Bair hugger in place HEENT: normal  Neck: supple. JVP to ear.  Carotids 2+ bilat; no bruits. No  lymphadenopathy or thryomegaly appreciated.  Cor: PMI laterally displaced. Regular rate & rhythm. 2/6 MR +s3  Lungs: + clear Abdomen: soft, nontender, nondistended. No hepatosplenomegaly. No bruits or masses. Good bowel sounds.  Extremities: no cyanosis, clubbing, rash, 1+ edema R and LLE to thighs  SCDs femoral trialysis catheter Neuro: alert & orientedx3, cranial nerves grossly intact. moves all 4 extremities w/o difficulty. Affect pleasant   Telemetry: SR 90s  Labs: Basic Metabolic Panel:  Recent Labs Lab 06/14/14 0551  06/15/14 0526  06/16/14 0248 06/16/14 0249 06/16/14 1815 06/17/14 0230 06/17/14 1930 06/18/14 0238  NA 139  138  < > 137  < > 132*  --  131* 134* 130* 134*  K 4.2  4.1  < > 4.3  < > 4.5  --  4.5 4.4 4.6 4.4  CL 99  99  < > 99  < > 94*  --  95* 97 94* 96  CO2 25  26  < > 25  < > 24  --  23 23 24 24   GLUCOSE 119*  121*  < > 131*  < > 116*  --  109* 118* 121* 109*  BUN 9  9  < > 9  < > 7  --  10 10 12 11   CREATININE 1.48*  1.51*  < > 1.69*  < > 1.64*  --  2.01* 1.99* 1.97* 1.87*  CALCIUM 8.9  8.8  < > 8.7  < > 8.6  --  8.4 8.4 8.5 8.5  MG 2.7*  --  2.8*  --   --  2.6*  --  2.6*  --  2.5  PHOS 2.6  < > 1.8*  < > 2.1*  --  2.1* 2.6 2.1* 2.8  < > = values in this interval not displayed.  Liver Function Tests:  Recent Labs Lab 06/16/14 0248 06/16/14 1815 06/17/14 0230 06/17/14 1930 06/18/14 0238  ALBUMIN 2.7* 2.7* 2.6* 2.7* 2.8*   No results for input(s): LIPASE, AMYLASE in the last 168 hours. No results for input(s): AMMONIA in the last 168 hours.  CBC:  Recent Labs Lab 06/12/14 0450 06/13/14 0449 06/14/14 0551 06/15/14 0526 06/16/14 0249 06/17/14 0230 06/18/14 0238  WBC 7.4 6.7 6.2 6.8 6.2 6.2 6.4  NEUTROABS 5.3 4.4 4.0 4.5  --   --   --   HGB 11.0* 10.0* 11.3* 11.0* 10.6* 9.9* 9.9*  HCT 35.0* 32.5* 37.1* 35.0* 34.1* 31.4* 31.5*  MCV 78.1 77.4* 77.6* 76.8* 78.9 77.1* 77.4*  PLT 134* 136* 139* 134* 130* 130* 169    Cardiac  Enzymes: No results for input(s): CKTOTAL, CKMB, CKMBINDEX, TROPONINI in the last 168 hours.  BNP: BNP (last 3 results)  Recent Labs  06/08/14 1040 06/12/14 0450  PROBNP 11335.0* 6716.0*     Other results:    Imaging: No results found.   Medications:     Scheduled Medications: . amiodarone  200 mg Oral Daily  . antiseptic oral rinse  7 mL Mouth Rinse BID  . calcitRIOL  0.25 mcg Oral Daily  . feeding supplement (NEPRO CARB STEADY)  237 mL Oral BID BM  . insulin aspart  2-6 Units Subcutaneous 6 times per day  . pantoprazole  40 mg Oral Daily  . sodium phosphate  Dextrose 5% IVPB  10 mmol Intravenous Q12H  . vancomycin  1,250 mg Intravenous Q24H    Infusions: . DOBUTamine 5 mcg/kg/min (06/18/14 0800)  . heparin 10,000 units/ 20 mL infusion syringe 650 Units/hr (06/18/14 0930)  . heparin 1,050 Units/hr (06/18/14 0300)  . dialysis replacement fluid (prismasate) 800 mL/hr at 06/18/14 0639  . dialysis replacement fluid (prismasate) 700 mL/hr at 06/18/14 0759  . dialysate (PRISMASATE) 2,000 mL/hr at 06/18/14 0759    PRN Medications: heparin, heparin, sodium chloride   Assessment:   1. Acute respiratory failure  2. A/c systolic HF due to NICM EF < 15%  3. Acute on chronic renal failure stage IV  4. Hyperkalemia  5. Cardiogenic shock  6. DM2  7. Cocaine abuse in remission 3-4 months  8. H/o gout  9. Supratherapeutic INR  10. LV thrombus  11. PAF on amio  12. H/o VT 13. Fever 11/1   Plan/Discussion:    Volume status improving. CVP coming down. Continue to pull fluid with CVVHD. Would continue CVVHD until we can get CVP in 8-10 range- ? One more day.   Weaning dobutamine is certainly a consideration but unfortunately I don't think he will tolerate it for long. In June of this year he had shock as well and required IABP and required dobutamine subsequently.   If kidneys don't recover will need to discuss options further. As he is dobutamine dependent can't  go to HD Center. Dr. Kathrene Bongo has raised issue of PD or home iHD on dobutamine. I think  both of these options are reasonable and I suspect he would tolerate iHD on dobutamine. .   F/u bcx remain negative. Utility of TEE seems low at this point pending renal recovery.    Daniel Bensimhon,MD 9:49 AM

## 2014-06-19 DIAGNOSIS — J811 Chronic pulmonary edema: Secondary | ICD-10-CM

## 2014-06-19 LAB — RENAL FUNCTION PANEL
Albumin: 2.6 g/dL — ABNORMAL LOW (ref 3.5–5.2)
Albumin: 2.7 g/dL — ABNORMAL LOW (ref 3.5–5.2)
Anion gap: 12 (ref 5–15)
Anion gap: 14 (ref 5–15)
BUN: 12 mg/dL (ref 6–23)
BUN: 8 mg/dL (ref 6–23)
CALCIUM: 8.2 mg/dL — AB (ref 8.4–10.5)
CHLORIDE: 97 meq/L (ref 96–112)
CO2: 22 mEq/L (ref 19–32)
CO2: 24 mEq/L (ref 19–32)
Calcium: 8.5 mg/dL (ref 8.4–10.5)
Chloride: 94 mEq/L — ABNORMAL LOW (ref 96–112)
Creatinine, Ser: 2.05 mg/dL — ABNORMAL HIGH (ref 0.50–1.35)
Creatinine, Ser: 1.44 mg/dL — ABNORMAL HIGH (ref 0.50–1.35)
GFR calc Af Amer: 57 mL/min — ABNORMAL LOW (ref >90)
GFR calc non Af Amer: 50 mL/min — ABNORMAL LOW (ref >90)
GFR, EST AFRICAN AMERICAN: 37 mL/min — AB (ref >90)
GFR, EST NON AFRICAN AMERICAN: 32 mL/min — AB (ref >90)
GLUCOSE: 180 mg/dL — AB (ref 70–99)
Glucose, Bld: 139 mg/dL — ABNORMAL HIGH (ref 70–99)
PHOSPHORUS: 2.8 mg/dL (ref 2.3–4.6)
Phosphorus: 1.8 mg/dL — ABNORMAL LOW (ref 2.3–4.6)
Potassium: 4.2 mEq/L (ref 3.7–5.3)
Potassium: 4 mEq/L (ref 3.7–5.3)
SODIUM: 128 meq/L — AB (ref 137–147)
Sodium: 135 mEq/L — ABNORMAL LOW (ref 137–147)

## 2014-06-19 LAB — POCT ACTIVATED CLOTTING TIME
ACTIVATED CLOTTING TIME: 168 s
ACTIVATED CLOTTING TIME: 197 s
ACTIVATED CLOTTING TIME: 202 s
ACTIVATED CLOTTING TIME: 208 s
ACTIVATED CLOTTING TIME: 214 s
ACTIVATED CLOTTING TIME: 225 s
ACTIVATED CLOTTING TIME: 230 s
ACTIVATED CLOTTING TIME: 231 s
ACTIVATED CLOTTING TIME: 236 s
ACTIVATED CLOTTING TIME: 242 s
ACTIVATED CLOTTING TIME: 253 s
Activated Clotting Time: 185 seconds
Activated Clotting Time: 202 seconds
Activated Clotting Time: 247 seconds
Activated Clotting Time: 225 seconds
Activated Clotting Time: 236 seconds
Activated Clotting Time: 236 seconds
Activated Clotting Time: 214 seconds
Activated Clotting Time: 208 seconds

## 2014-06-19 LAB — CBC
HCT: 30.9 % — ABNORMAL LOW (ref 39.0–52.0)
HEMOGLOBIN: 9.6 g/dL — AB (ref 13.0–17.0)
MCH: 23.9 pg — AB (ref 26.0–34.0)
MCHC: 31.1 g/dL (ref 30.0–36.0)
MCV: 77.1 fL — ABNORMAL LOW (ref 78.0–100.0)
PLATELETS: 166 10*3/uL (ref 150–400)
RBC: 4.01 MIL/uL — ABNORMAL LOW (ref 4.22–5.81)
RDW: 21.3 % — ABNORMAL HIGH (ref 11.5–15.5)
WBC: 7 10*3/uL (ref 4.0–10.5)

## 2014-06-19 LAB — APTT: aPTT: 137 seconds — ABNORMAL HIGH (ref 24–37)

## 2014-06-19 LAB — GLUCOSE, CAPILLARY
Glucose-Capillary: 121 mg/dL — ABNORMAL HIGH (ref 70–99)
Glucose-Capillary: 192 mg/dL — ABNORMAL HIGH (ref 70–99)
Glucose-Capillary: 224 mg/dL — ABNORMAL HIGH (ref 70–99)
Glucose-Capillary: 156 mg/dL — ABNORMAL HIGH (ref 70–99)

## 2014-06-19 LAB — PROTIME-INR
INR: 1.41 (ref 0.00–1.49)
Prothrombin Time: 17.4 seconds — ABNORMAL HIGH (ref 11.6–15.2)

## 2014-06-19 LAB — HEPARIN LEVEL (UNFRACTIONATED): Heparin Unfractionated: 0.53 IU/mL (ref 0.30–0.70)

## 2014-06-19 LAB — MAGNESIUM: Magnesium: 2.4 mg/dL (ref 1.5–2.5)

## 2014-06-19 MED ORDER — ACETAMINOPHEN 325 MG PO TABS
650.0000 mg | ORAL_TABLET | Freq: Four times a day (QID) | ORAL | Status: DC | PRN
  Administered 2014-06-19 – 2014-06-28 (×5): 650 mg via ORAL
  Filled 2014-06-19 (×6): qty 2

## 2014-06-19 NOTE — Progress Notes (Signed)
[facility] Rounding Note  Subjective: Continues to be without complaint. Denies CP or SOB. Remains on CRRT- looks good  Objective: Vital signs in last 24 hours: Temp:  [97.7 F (36.5 C)-98.3 F (36.8 C)] 98.3 F (36.8 C) (11/10 0749) Pulse Rate:  [90-103] 93 (11/10 0700) Resp:  [12-31] 17 (11/10 0700) BP: (70-109)/(39-73) 88/62 mmHg (11/10 0700) SpO2:  [96 %-100 %] 98 % (11/10 0700) Weight:  [213 lb 10 oz (96.9 kg)] 213 lb 10 oz (96.9 kg) (11/10 0400) Weight change: -4 lb 6.6 oz (-2 kg)  Intake/Output from previous day: 11/09 0701 - 11/10 0700 In: 2208.5 [P.O.:957; I.V.:427.5; IV Piggyback:654] Out: 5241 [Urine:50]  Down -2.6 L in last 24 hrs; Net - 17 L since admit  Weight Trending 10/30  112.4 kg 10/31  114.1 kg 11/1    113.5 kg 11/2    111.2 kg     11/3     108.5 kg   11/4     106.8 kg   11/5     106.4 kg 11/6    102.4 kg 11/7    102.9 kg 11/8    100.2 kg 11/9     98.9 kg 11/10   96.6 kg  Total I/O In: 28.2 [I.V.:18.2; Other:10] Out: 187 [Other:187] Physical Examination: BP 88/62 mmHg  Pulse 93  Temp(Src) 98.3 F (36.8 C) (Oral)  Resp 17  Ht 6' (1.829 m)  Wt 213 lb 10 oz (96.9 kg)  BMI 28.97 kg/m2  SpO2 98%  CVP = 10-15 in past 24hrs Awake; A&Ox3 RRR; S1S2 + s3 w/ 2/6 systolic murmur Lungs CTAB, no crackles heard 1+ edema bilat notable at posterior hips and calves somewhat less PICC in right arm, trialysis catheter in right groin (10/30)   Recent Labs  06/18/14 0238 06/19/14 0153  WBC 6.4 7.0  HGB 9.9* 9.6*  HCT 31.5* 30.9*  PLT 169 166    Recent Labs  06/18/14 1600 06/19/14 0153  NA 134* 128*  K 4.4 4.2  CL 96 94*  CO2 25 22  GLUCOSE 209* 139*  BUN 9 12  CREATININE 1.50* 2.05*  CALCIUM 8.2* 8.5    Recent Labs  06/18/14 0238 06/18/14 1600 06/19/14 0153  PHOS 2.8 1.8* 2.8  MG 2.5  --  2.4     Results for [name], [name] (MRN [id number]) as of 06/11/2014 07:18  Ref. Range 06/09/2014 07:30  Iron Latest  Range: 42-135 ug/dL 21 (L)  UIBC Latest Range: 125-400 ug/dL 409320  TIBC Latest Range: 215-435 ug/dL 811341  Saturation Ratios Latest Range: 20-55 % 6 (L)    Studies/Results: No results found. Scheduled Medications . amiodarone  200 mg Oral Daily  . antiseptic oral rinse  7 mL Mouth Rinse BID  . calcitRIOL  0.25 mcg Oral Daily  . feeding supplement (NEPRO CARB STEADY)  237 mL Oral BID BM  . insulin aspart  0-9 Units Subcutaneous TID WC  . pantoprazole  40 mg Oral Daily  . sodium phosphate  Dextrose 5% IVPB  10 mmol Intravenous Q12H  . vancomycin  1,250 mg Intravenous Q24H   Infusion/other Medications . DOBUTamine 5 mcg/kg/min (06/18/14 1400)  . heparin 10,000 units/ 20 mL infusion syringe 1,500 Units/hr (06/19/14 0800)  . heparin 1,050 Units/hr (06/19/14 0400)  . dialysis replacement fluid (prismasate) 800 mL/hr at 06/19/14 0752  . dialysis replacement fluid (prismasate) 700 mL/hr at 06/19/14 0602  . 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   He was admitted 10/30 with respiratory failure, worsening heart failure, creatinine of 4.6, hyperkalemia (6.8) and wide complex bradycardia. CRRT initiated 10/30 for volume control, renal failure. Has BC+ 11/1 for Bacillus on Vanc (ID following). He is not considered a candidate for outpt HD given need for chronic IV inotropes (and Dr. Teressa Lower skeptical that he could survive without the dobutamine).    Assessment/Plan:  AKI on CKD4  Remains oliguric. Unclear how much recovery he will get. CVP at 10-15 in past 24hrs and volume status improving. Pt is not candidate for heart transplant or pump therapy. Would not be candidate for outpt HD given need for outpt dobutamine for past several months-  although I am not sure if this is the case only because it has never been done- PD could be an option as well.   Dr Kittie Plater to decide when volume status is improved sufficiently to try off CRRT with goal CVP of 8-10. With sodium down today and still CVP relatively high not sure if today is the day  Continue CRRT 4K bath; Rate net neg 100 to 150 hour  to facilitate fluid removal and hold off on Catheter Holiday until fluid status has improved. (Dr. Kandis Mannan preference)  Plan for Catheter holiday when we get to a point to attempt IHD and see if renal fcn will improve (I doubt it will...).   Question whether the Dobutamine could be stopped once dry weight is achieved and be able to tolerate IHD?- Dr Gala Romney doubts this would be possible but could attempt.  Will talk to surgery about PD cath placement- possibly next week if does not recover renal function (would be able to do in patient IHD for time being)   CM with acute on chronic systolic heart failure/NICM  EF < 15% - on dobutamine. Per cardiology.   Hypophosphatemia- Resolved 2.8 on 11/10. - Continue sodium phosphate 10 mmol q12 hours and reassess dose tomorrow AM  Hyponatremia: Na 128 (11/10). Continue to monitor  H/O VT  PAF: INR 1.43 (11/4) IV heparin per Pharm  Anemia stable. Fe deficient but withholding IV iron in setting of bacillus bacteremia  PTH: Elevated at 168: Calcitriol (0.25 mcg) PO qd  Bacillus bacteremia (11/1). ID following. On vanco (11/1>>); zosyn stopped (11/1>>11/3)  Needs Catheter Holiday - Will continue with CRRT for fluid removal as he is currently stable and plan to remove line when we attempt to transition to IHD  Cocaine abuse - none for 4 months     LOS: 11 days   Wenda Low, MD PGY-2, [facility] 06/19/2014, 8:31 AM   Patient seen and examined, agree with above note with above modifications. Stable and looks great on CRRT.  CVP not quite at goal.  Plan still to stop CRRT when  we feel able.  Then I think we have more options than what have been discussed, possible PD or might tolerate intermittent HD with dobutamine ? Continue with plan to stop CRRT when can- then could do inpatient IHD while here until PD cath could be placed (maybe next week) all of this of course if renal function does not improve which we dont think it will   Annie Sable, MD 06/19/2014

## 2014-06-19 NOTE — Progress Notes (Signed)
ANTICOAGULATION CONSULT NOTE - Follow Up Consult  Pharmacy Consult for Heparin Indication: atrial fibrillation, apical thrombus  No Known Allergies  Patient Measurements: Height: 6' (182.9 cm) Weight: 213 lb 10 oz (96.9 kg) IBW/kg (Calculated) : 77.6 Heparin Dosing Weight: 98 kg  Vital Signs: Temp: 98 F (36.7 C) (11/10 0342) Temp Source: Oral (11/10 0342) BP: 88/62 mmHg (11/10 0700) Pulse Rate: 93 (11/10 0700)  Labs:  Recent Labs  06/17/14 0230  06/18/14 0238 06/18/14 1600 06/19/14 0153  HGB 9.9*  --  9.9*  --  9.6*  HCT 31.4*  --  31.5*  --  30.9*  PLT 130*  --  169  --  166  APTT 113*  --  102*  --  137*  LABPROT 17.6*  --  17.0*  --  17.4*  INR 1.44  --  1.36  --  1.41  HEPARINUNFRC 0.41  --  0.36  --  0.53  CREATININE 1.99*  < > 1.87* 1.50* 2.05*  < > = values in this interval not displayed.  Estimated Creatinine Clearance: 43.3 mL/min (by C-G formula based on Cr of 2.05).   Medications:  Infusions:  . DOBUTamine 5 mcg/kg/min (06/18/14 1400)  . heparin 10,000 units/ 20 mL infusion syringe 1,600 Units/hr (06/19/14 0500)  . heparin 1,050 Units/hr (06/19/14 0400)  . dialysis replacement fluid (prismasate) 800 mL/hr at 06/19/14 0109  . dialysis replacement fluid (prismasate) 700 mL/hr at 06/19/14 0602  . dialysate (PRISMASATE) 2,000 mL/hr at 06/19/14 0706    Assessment: 63 year old male on anticoagulation with heparin for atrial fibrillation and apical thrombus.  Coumadin remains on hold- INR 1.41.  His heparin remains therapeutic. Hgb trending down. Platelets are stable. No bleeding noted.   Goal of Therapy:  Heparin level 0.3-0.7 units/ml Monitor platelets by anticoagulation protocol: Yes    Plan:  Continue Heparin at 1050 units/hr Daily heparin level and CBC Consider restart of Coumadin therapy if appropriate  Link Snuffer, PharmD, BCPS Clinical Pharmacist [phone]  06/19/2014, 7:49 AM

## 2014-06-19 NOTE — Progress Notes (Signed)
eLink Physician-Brief Progress Note Patient Name: [name] DOB: [birth year] MRN: [id number]   Date of Service  06/19/2014  HPI/Events of Note  Pt c/o soreness from laying in bed.  eICU Interventions  Will order prn tylenol.     Intervention Category Major Interventions: Other:  Karalynn Cottone 06/19/2014, 8:07 PM

## 2014-06-19 NOTE — Progress Notes (Signed)
[facility]      Subjective: Continues to feel better   Antibiotics:  Anti-infectives    Start     Dose/Rate Route Frequency Ordered Stop   06/18/14 1000  vancomycin (VANCOCIN) 1,250 mg in sodium chloride 0.9 % 150 mL IVPB     1,250 mg100 mL/hr over 90 Minutes Intravenous Every 24 hours 06/17/14 1134     06/14/14 0800  vancomycin (VANCOCIN) 1,500 mg in sodium chloride 0.9 % 250 mL IVPB  Status:  Discontinued     1,500 mg125 mL/hr over 120 Minutes Intravenous Every 24 hours 06/13/14 1528 06/17/14 1134   06/10/14 1000  piperacillin-tazobactam (ZOSYN) IVPB 3.375 g  Status:  Discontinued     3.375 g100 mL/hr over 30 Minutes Intravenous Every 6 hours 06/10/14 0918 06/12/14 1318   06/10/14 1000  vancomycin (VANCOCIN) 1,250 mg in sodium chloride 0.9 % 250 mL IVPB  Status:  Discontinued     1,250 mg166.7 mL/hr over 90 Minutes Intravenous Every 24 hours 06/10/14 0918 06/13/14 1528      Medications: Scheduled Meds: . amiodarone  200 mg Oral Daily  . antiseptic oral rinse  7 mL Mouth Rinse BID  . calcitRIOL  0.25 mcg Oral Daily  . feeding supplement (NEPRO CARB STEADY)  237 mL Oral BID BM  . insulin aspart  0-9 Units Subcutaneous TID WC  . pantoprazole  40 mg Oral Daily  . sodium phosphate  Dextrose 5% IVPB  10 mmol Intravenous Q12H  . vancomycin  1,250 mg Intravenous Q24H   Continuous Infusions: . DOBUTamine 5 mcg/kg/min (06/18/14 1400)  . heparin 10,000 units/ 20 mL infusion syringe 1,200 Units/hr (06/19/14 1400)  . heparin 1,050 Units/hr (06/19/14 0400)  . dialysis replacement fluid (prismasate) 800 mL/hr at 06/19/14 1422  . dialysis replacement fluid (prismasate) 700 mL/hr at 06/19/14 1345  . dialysate (PRISMASATE) 2,000 mL/hr at 06/19/14 1156   PRN Meds:.heparin, heparin, sodium chloride    Objective: Weight change: -4 lb 6.6 oz (-2 kg)  Intake/Output Summary (Last 24 hours) at 06/19/14 1439 Last data filed at 06/19/14 1400  Gross per 24  hour  Intake 2115.2 ml  Output   5618 ml  Net -3502.8 ml   Blood pressure 78/61, pulse 98, temperature 97.5 F (36.4 C), temperature source Oral, resp. rate 20, height 6' (1.829 m), weight 213 lb 10 oz (96.9 kg), SpO2 100 %. Temp:  [97.5 F (36.4 C)-98.3 F (36.8 C)] 97.5 F (36.4 C) (11/10 1229) Pulse Rate:  [90-103] 98 (11/10 1100) Resp:  [12-31] 20 (11/10 0800) BP: (65-95)/(52-70) 78/61 mmHg (11/10 1100) SpO2:  [96 %-100 %] 100 % (11/10 1100) Weight:  [213 lb 10 oz (96.9 kg)] 213 lb 10 oz (96.9 kg) (11/10 0400)  Physical Exam: General: Alert and awake, oriented x3, under a cooling blanket HEENT: anicteric sclera,  EOMI, oropharynx clear and without exudate CVS tachycardic raterate, 2/6 systolic murmur at PMI Chest: clear to auscultation bilaterally anteriorly Abdomen: soft nontender,distended, normal bowel sounds, Extremities: 2+ edema noted bilaterally Neuro: nonfocal, strength and sensation intact Skin: PICC line intact dialysis line in place  CBC:  CBC Latest Ref Rng 06/19/2014 06/18/2014 06/17/2014  WBC 4.0 - 10.5 K/uL 7.0 6.4 6.2  Hemoglobin 13.0 - 17.0 g/dL 1.8(A) 6.7(R) 3.7(V)  Hematocrit 39.0 - 52.0 % 30.9(L) 31.5(L) 31.4(L)  Platelets 150 - 400 K/uL 166 169 130(L)      BMET  Recent Labs  06/18/14 1600 06/19/14 0153  NA 134* 128*  K  4.4 4.2  CL 96 94*  CO2 25 22  GLUCOSE 209* 139*  BUN 9 12  CREATININE 1.50* 2.05*  CALCIUM 8.2* 8.5     Liver Panel   Recent Labs  06/18/14 1600 06/19/14 0153  ALBUMIN 2.7* 2.6*       Sedimentation Rate No results for input(s): ESRSEDRATE in the last 72 hours. C-Reactive Protein No results for input(s): CRP in the last 72 hours.  Micro Results: Recent Results (from the past 720 hour(s))  MRSA PCR Screening     Status: None   Collection Time: 06/08/14  1:16 PM  Result Value Ref Range Status   MRSA by PCR NEGATIVE NEGATIVE Final    Comment:        The GeneXpert MRSA Assay (FDA approved for NASAL  specimens only), is one component of a comprehensive MRSA colonization surveillance program. It is not intended to diagnose MRSA infection nor to guide or monitor treatment for MRSA infections.  Urine culture     Status: None   Collection Time: 06/09/14 10:32 AM  Result Value Ref Range Status   Specimen Description URINE, CATHETERIZED  Final   Special Requests Immunocompromised  Final   Culture  Setup Time   Final    06/09/2014 20:50 Performed at [facility]    Colony Count NO GROWTH Performed at [facility]   Final   Culture NO GROWTH Performed at [facility]   Final   Report Status 06/10/2014 FINAL  Final  Culture, blood (routine x 2)     Status: None   Collection Time: 06/10/14  3:39 AM  Result Value Ref Range Status   Specimen Description BLOOD LEFT HAND  Final   Special Requests BOTTLES DRAWN AEROBIC ONLY 4CC  Final   Culture  Setup Time   Final    06/10/2014 17:58 Performed at [facility]    Culture   Final    BACILLUS SPECIES Note: Standardized susceptibility testing for this organism is not available. Note: Gram Stain Report Called to,Read Back By and Verified With: BRYCE SHEPARD @ 3:58AM 11.2.15 BY DESIS Performed at [facility]    Report Status 06/12/2014 FINAL  Final  Culture, blood (routine x 2)     Status: None   Collection Time: 06/10/14  3:52 AM  Result Value Ref Range Status   Specimen Description BLOOD LEFT HAND  Final   Special Requests BOTTLES DRAWN AEROBIC ONLY 8CC  Final   Culture  Setup Time   Final    06/10/2014 17:58 Performed at [facility]    Culture   Final    BACILLUS SPECIES Note: Standardized susceptibility testing for this organism is not available. Note: Gram Stain Report Called to,Read Back By and Verified With: BRYCE SHEPARD @ 3:58AM 11.2.15 BY DESIS Culture results may be compromised due to an excessive volume of blood received in culture bottles. Performed at [facility]    Report Status 06/12/2014 FINAL  Final  Culture, blood (routine x 2)     Status: None   Collection Time: 06/12/14  1:54 PM  Result Value Ref Range Status   Specimen Description BLOOD LEFT HAND  Final   Special Requests   Final    BOTTLES DRAWN AEROBIC AND ANAEROBIC BLUE 10CC RED 5CC   Culture  Setup Time   Final    06/12/2014 20:35 Performed at [facility]    Culture   Final    NO GROWTH 5 DAYS  [facility] at [facility]    Report Status 06/18/2014 FINAL  Final  Culture, blood (routine x 2)     Status: None   Collection Time: 06/12/14  2:05 PM  Result Value Ref Range Status   Specimen Description BLOOD LEFT ARM  Final   Special Requests BOTTLES DRAWN AEROBIC AND ANAEROBIC 10CC  Final   Culture  Setup Time   Final    06/12/2014 20:35 Performed at [facility]    Culture   Final    NO GROWTH 5 DAYS Performed at [facility]    Report Status 06/18/2014 FINAL  Final    Studies/Results: No results found.    Assessment/Plan:  Active Problems:   Acute respiratory failure   Cardiogenic shock   Acute on chronic systolic congestive heart failure   Acute on chronic renal failure   Hyperkalemia   Respiratory failure   Bacteremia associated with intravascular line   Blood poisoning   Acute respiratory failure with hypoxia   AKI (acute kidney injury)   Screen for STD (sexually transmitted disease)   Pulmonary edema    [name] MaceKenneth Cheyney is a 63 y.o. male with PICC line at home with dobutamine infusion now with admission for CHF, renal failure, hyperkalemia, bradycardia, fevers and found to be bacteremic with Bacillus (likely cereus)  #1 Bacillus Cereus bacteremia: likely due to his improper non sterile manipulation of his line when he was changing infusion site. 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 Lab does not speciate beyond saying  this non anthrax Bacillus and an aerobe, no sensis available  -- narrowed to vancomycin, given resistance to penicillin found with this organism' -- Cardiology and Nephrology to try to get all lines out once sufficient volume is removed via HD --AFter we have given him a "line holiday" of 48 hours would then give him vancomycin for 2 weeks with target trough of 15-20    LOS: 11 days   Acey LavCornelius Van Dam 06/19/2014, 2:39 PM

## 2014-06-19 NOTE — Progress Notes (Addendum)
Patient ID: [name] MaceKenneth Gonsoulin, male   DOB: May 08, 1951, 63 y.o.   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 Maintained on dobutamine 435mcg/kg/min.   Past medical h/o is notable for CKD with baseline Cr 3.0, cocaine abuse, DM2, AF on amio, VT. He has not been candidate for advanced therapies due to cocaine abuse (quit 4 months ago) and renal failure. Started on dobutamine. Moved to [place] to try to stay off cocaine.   On Oct 14 [facility]. CR was 3.3 (on 04/16/2014 Cr [facility]6 [facility]) and potassium was low. Kcl was increased. Over past week increased dyspnea and edema. NYHA IIIB symptoms at baseline.   10/30 Admitted with rep failure with wide complex bradycardia. K 6.8 and Cr 4.6. Intubated and CVVHD started.  Extubated 11/1.  Continues on CVVHD.  Bcx + for bacillus. ID requesting line holiday.  Feels fine. Weight down another 5 pounds. But CVP staying 14-15. Dobutamine down to 5.  SPB in 80-100.  U/o < 10cc/hr.   Objective:   Weight Range:  Vital Signs:   Temp:  [97.5 F (36.4 C)-98.3 F (36.8 C)] 97.5 F (36.4 C) (11/10 1229) Pulse Rate:  [90-103] 98 (11/10 1100) Resp:  [12-31] 20 (11/10 0800) BP: (65-95)/(39-70) 78/61 mmHg (11/10 1100) SpO2:  [96 %-100 %] 100 % (11/10 1100) Weight:  [96.9 kg (213 lb 10 oz)] 96.9 kg (213 lb 10 oz) (11/10 0400) Last BM Date: 06/18/14  Weight change: Filed Weights   06/17/14 0400 06/18/14 0400 06/19/14 0400  Weight: 100.2 kg (220 lb 14.4 oz) 98.9 kg (218 lb 0.6 oz) 96.9 kg (213 lb 10 oz)    Intake/Output:   Intake/Output Summary (Last 24 hours) at 06/19/14 1336 Last data filed at 06/19/14 1300  Gross per 24 hour  Intake 1867.7 ml  Output   5668 ml  Net -3800.3 ml     Physical Exam: CVP 14-15 General: NAD  Awake in bed. Bair hugger in place HEENT: normal  Neck: supple. JVP to ear.  Carotids 2+ bilat; no  bruits. No lymphadenopathy or thryomegaly appreciated.  Cor: PMI laterally displaced. Regular rate & rhythm. 2/6 MR +s3  Lungs: clear Abdomen: soft, nontender, nondistended. No hepatosplenomegaly. No bruits or masses. Good bowel sounds.  Extremities: no cyanosis, clubbing, rash, 1+ edema R and LLE to thighs  SCDs femoral trialysis catheter Neuro: alert & orientedx3, cranial nerves grossly intact. moves all 4 extremities w/o difficulty. Affect pleasant   Telemetry: SR 90s  Labs: Basic Metabolic Panel:  Recent Labs Lab 06/15/14 0526  06/16/14 0249  06/17/14 0230 06/17/14 1930 06/18/14 0238 06/18/14 1600 06/19/14 0153  NA 137  < >  --   < > 134* 130* 134* 134* 128*  K 4.3  < >  --   < > 4.4 4.6 4.4 4.4 4.2  CL 99  < >  --   < > 97 94* 96 96 94*  CO2 25  < >  --   < > 23 24 24 25 22   GLUCOSE 131*  < >  --   < > 118* 121* 109* 209* 139*  BUN 9  < >  --   < > 10 12 11 9 12   CREATININE 1.69*  < >  --   < > 1.99* 1.97* 1.87* 1.50* 2.05*  CALCIUM 8.7  < >  --   < >  8.4 8.5 8.5 8.2* 8.5  MG 2.8*  --  2.6*  --  2.6*  --  2.5  --  2.4  PHOS 1.8*  < >  --   < > 2.6 2.1* 2.8 1.8* 2.8  < > = values in this interval not displayed.  Liver Function Tests:  Recent Labs Lab 06/17/14 0230 06/17/14 1930 06/18/14 0238 06/18/14 1600 06/19/14 0153  ALBUMIN 2.6* 2.7* 2.8* 2.7* 2.6*   No results for input(s): LIPASE, AMYLASE in the last 168 hours. No results for input(s): AMMONIA in the last 168 hours.  CBC:  Recent Labs Lab 06/13/14 0449 06/14/14 0551 06/15/14 0526 06/16/14 0249 06/17/14 0230 06/18/14 0238 06/19/14 0153  WBC 6.7 6.2 6.8 6.2 6.2 6.4 7.0  NEUTROABS 4.4 4.0 4.5  --   --   --   --   HGB 10.0* 11.3* 11.0* 10.6* 9.9* 9.9* 9.6*  HCT 32.5* 37.1* 35.0* 34.1* 31.4* 31.5* 30.9*  MCV 77.4* 77.6* 76.8* 78.9 77.1* 77.4* 77.1*  PLT 136* 139* 134* 130* 130* 169 166    Cardiac Enzymes: No results for input(s): CKTOTAL, CKMB, CKMBINDEX, TROPONINI in the last 168  hours.  BNP: BNP (last 3 results)  Recent Labs  06/08/14 1040 06/12/14 0450  PROBNP 11335.0* 6716.0*     Other results:    Imaging: No results found.   Medications:     Scheduled Medications: . amiodarone  200 mg Oral Daily  . antiseptic oral rinse  7 mL Mouth Rinse BID  . calcitRIOL  0.25 mcg Oral Daily  . feeding supplement (NEPRO CARB STEADY)  237 mL Oral BID BM  . insulin aspart  0-9 Units Subcutaneous TID WC  . pantoprazole  40 mg Oral Daily  . sodium phosphate  Dextrose 5% IVPB  10 mmol Intravenous Q12H  . vancomycin  1,250 mg Intravenous Q24H    Infusions: . DOBUTamine 5 mcg/kg/min (06/18/14 1400)  . heparin 10,000 units/ 20 mL infusion syringe 1,200 Units/hr (06/19/14 1300)  . heparin 1,050 Units/hr (06/19/14 0400)  . dialysis replacement fluid (prismasate) 800 mL/hr at 06/19/14 0752  . dialysis replacement fluid (prismasate) 700 mL/hr at 06/19/14 0602  . dialysate (PRISMASATE) 2,000 mL/hr at 06/19/14 1156    PRN Medications: heparin, heparin, sodium chloride   Assessment:   1. Acute respiratory failure  2. A/c systolic HF due to NICM EF < 15%  3. Acute on chronic renal failure stage IV  4. Hyperkalemia  5. Cardiogenic shock  6. DM2  7. Cocaine abuse in remission 3-4 months  8. H/o gout  9. Supratherapeutic INR  10. LV thrombus  11. PAF on amio  12. H/o VT 13. Fever 11/1   Plan/Discussion:    Volume status improving. Now down almost 40 pounds. CVP hovering around 15 though. Continue to pull fluid with CVVHD. Would continue CVVHD until we can get CVP in 8-10 range. Will place on fluid restriction.  Will have PT see to provide bed exercises as able.   Weaning dobutamine is certainly a consideration but unfortunately I don't think he will tolerate it for long. In June of this year he had shock as well and required IABP and required dobutamine subsequently.   If kidneys don't recover will need to discuss options further. As he is dobutamine  dependent can't go to HD Center. Dr. Kathrene Bongo has raised issue of PD or home iHD on dobutamine. I think both of these options are reasonable and I suspect he would tolerate iHD on dobutamine. Marland Kitchen  F/u bcx remain negative. Utility of TEE seems low at this point pending renal recovery.    Daniel Bensimhon,MD 1:36 PM

## 2014-06-20 LAB — PROTIME-INR
INR: 1.41 (ref 0.00–1.49)
Prothrombin Time: 17.4 seconds — ABNORMAL HIGH (ref 11.6–15.2)

## 2014-06-20 LAB — RENAL FUNCTION PANEL
Albumin: 2.7 g/dL — ABNORMAL LOW (ref 3.5–5.2)
Anion gap: 13 (ref 5–15)
BUN: 12 mg/dL (ref 6–23)
CALCIUM: 8.6 mg/dL (ref 8.4–10.5)
CO2: 24 mEq/L (ref 19–32)
Chloride: 96 mEq/L (ref 96–112)
Creatinine, Ser: 2.21 mg/dL — ABNORMAL HIGH (ref 0.50–1.35)
GFR calc non Af Amer: 30 mL/min — ABNORMAL LOW (ref >90)
GFR, EST AFRICAN AMERICAN: 34 mL/min — AB (ref >90)
GLUCOSE: 213 mg/dL — AB (ref 70–99)
Phosphorus: 2.9 mg/dL (ref 2.3–4.6)
Potassium: 4.4 mEq/L (ref 3.7–5.3)
Sodium: 133 mEq/L — ABNORMAL LOW (ref 137–147)

## 2014-06-20 LAB — GLUCOSE, CAPILLARY
GLUCOSE-CAPILLARY: 160 mg/dL — AB (ref 70–99)
Glucose-Capillary: 154 mg/dL — ABNORMAL HIGH (ref 70–99)
Glucose-Capillary: 170 mg/dL — ABNORMAL HIGH (ref 70–99)
Glucose-Capillary: 185 mg/dL — ABNORMAL HIGH (ref 70–99)

## 2014-06-20 LAB — HEPARIN LEVEL (UNFRACTIONATED): HEPARIN UNFRACTIONATED: 0.56 [IU]/mL (ref 0.30–0.70)

## 2014-06-20 LAB — CBC
HCT: 32.4 % — ABNORMAL LOW (ref 39.0–52.0)
HEMOGLOBIN: 10.3 g/dL — AB (ref 13.0–17.0)
MCH: 24.5 pg — ABNORMAL LOW (ref 26.0–34.0)
MCHC: 31.8 g/dL (ref 30.0–36.0)
MCV: 77 fL — ABNORMAL LOW (ref 78.0–100.0)
Platelets: 184 10*3/uL (ref 150–400)
RBC: 4.21 MIL/uL — AB (ref 4.22–5.81)
RDW: 21.4 % — ABNORMAL HIGH (ref 11.5–15.5)
WBC: 6.8 10*3/uL (ref 4.0–10.5)

## 2014-06-20 LAB — POCT ACTIVATED CLOTTING TIME
ACTIVATED CLOTTING TIME: 202 s
ACTIVATED CLOTTING TIME: 202 s

## 2014-06-20 LAB — APTT: aPTT: 152 seconds — ABNORMAL HIGH (ref 24–37)

## 2014-06-20 LAB — MAGNESIUM: Magnesium: 2.5 mg/dL (ref 1.5–2.5)

## 2014-06-20 MED ORDER — SODIUM CHLORIDE 0.9 % IV SOLN
INTRAVENOUS | Status: DC | PRN
  Administered 2014-06-20: via INTRAVENOUS

## 2014-06-20 NOTE — Progress Notes (Signed)
[name] [name] [name] Rounding Note  Subjective: Continues to be without complaint. Denies CP or SOB. Remains on CRRT- looks good.  CRRT to stop today  Objective: Vital signs in last 24 hours: Temp:  [97.5 F (36.4 C)-98.1 F (36.7 C)] 98 F (36.7 C) (11/11 0759) Pulse Rate:  [88-98] 92 (11/11 0615) Resp:  [8-29] 17 (11/11 0615) BP: (65-95)/(51-66) 83/55 mmHg (11/11 0600) SpO2:  [94 %-100 %] 99 % (11/11 0615) Weight:  [216 lb 7.9 oz (98.2 kg)] 216 lb 7.9 oz (98.2 kg) (11/11 0415) Weight change: 2 lb 13.9 oz (1.3 kg)  Intake/Output from previous day: 11/10 0701 - 11/11 0700 In: 2332.3 [P.O.:977; I.V.:467.3; IV Piggyback:738] Out: 4406   Down -4.4 L in last 24 hrs; Net - 26 L since admit  Weight Trending 10/30  112.4 kg 10/31  114.1 kg 11/1    113.5 kg 11/2    111.2 kg     11/3     108.5 kg   11/4     106.8 kg   11/5     106.4 kg 11/6    102.4 kg 11/7    102.9 kg 11/8    100.2 kg 11/9     98.9 kg 11/10   96.6 kg 11/11    98.2 kg  Total I/O In: -  Out: 142 [Other:142] Physical Examination: BP 83/55 mmHg  Pulse 92  Temp(Src) 98 F (36.7 C) (Oral)  Resp 17  Ht 6' (1.829 m)  Wt 216 lb 7.9 oz (98.2 kg)  BMI 29.36 kg/m2  SpO2 99%  CVP = 9-13 in past 24hrs Awake; A&Ox3 RRR; S1S2 + s3 w/ 2/6 systolic murmur Lungs CTAB, no crackles heard 1+ edema bilat notable at calves somewhat less PICC in right arm, trialysis catheter in right groin (10/30)   Recent Labs  06/19/14 0153 06/20/14 0235  WBC 7.0 6.8  HGB 9.6* 10.3*  HCT 30.9* 32.4*  PLT 166 184    Recent Labs  06/19/14 1600 06/20/14 0235  NA 135* 133*  K 4.0 4.4  CL 97 96  CO2 24 24  GLUCOSE 180* 213*  BUN 8 12  CREATININE 1.44* 2.21*  CALCIUM 8.2* 8.6    Recent Labs  06/19/14 0153 06/19/14 1600 06/20/14 0235  PHOS 2.8 1.8* 2.9  MG 2.4  --  2.5     Results for [name] [name], [name] (MRN [id number]) as of 06/11/2014 07:18  Ref. Range 06/09/2014 07:30  Iron Latest Range: 42-135 ug/dL 21  (L)  UIBC Latest Range: 125-400 ug/dL 546  TIBC Latest Range: 215-435 ug/dL 270  Saturation Ratios Latest Range: 20-55 % 6 (L)    Studies/Results: No results found. Scheduled Medications . amiodarone  200 mg Oral Daily  . antiseptic oral rinse  7 mL Mouth Rinse BID  . calcitRIOL  0.25 mcg Oral Daily  . feeding supplement (NEPRO CARB STEADY)  237 mL Oral BID BM  . insulin aspart  0-9 Units Subcutaneous TID WC  . pantoprazole  40 mg Oral Daily  . sodium phosphate  Dextrose 5% IVPB  10 mmol Intravenous Q12H  . vancomycin  1,250 mg Intravenous Q24H   Infusion/other Medications . sodium chloride 10 mL/hr at 06/20/14 0020  . DOBUTamine 7.5 mcg/kg/min (06/20/14 0414)  . heparin 10,000 units/ 20 mL infusion syringe 850 Units/hr (06/19/14 2304)  . heparin 1,050 Units/hr (06/20/14 0612)  . dialysis replacement fluid (prismasate) 800 mL/hr at 06/20/14 0347  . dialysis replacement fluid (prismasate) 700 mL/hr at 06/20/14 0445  .  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   He was admitted 10/30 with respiratory failure, worsening heart failure, creatinine of 4.6, hyperkalemia (6.8) and wide complex bradycardia. CRRT initiated 10/30 for volume control, renal failure. Has BC+ 11/1 for Bacillus on Vanc (ID following). He is not considered a candidate for outpt HD given need for chronic IV inotropes (and [name]. Teressa [name] [name] skeptical that he could survive without the dobutamine).    Assessment/Plan:  AKI on CKD4  Remains oliguric. Unclear how much recovery he will get. CVP better and volume status improving. Pt is not candidate for heart transplant or pump therapy. Would not be candidate for outpt HD given need for outpt dobutamine.  Per Bensimhon:  volume status good; pull lines and stop CVVHD  Start Catheter holiday and see if renal fcn will improve (I doubt it will...).   Question whether the Dobutamine could be stopped and he be able to tolerate IHD?- [name] [name] [name] doubts this would be possible but could attempt vs try PD- the issue with PD is that his home environment may not be ideal and as is the case with PD a back up plan is needed if gets peritonitis/mechanical issues- would favor maybe trying to wean dobutamine ?maybe could be possible now since volume is so much better ? slowly over the next week or so and we could establish on IHD- could run 4 times a week so as to limit amount of fluid needing to be removed at any given time    CM with acute on chronic systolic heart failure/NICM  EF < 15% - on dobutamine. Per cardiology.   Hypophosphatemia- Resolved 2.8 on 11/10. - Stop sodium phosphate -reassess dose tomorrow AM  Hyponatremia: Improving Na 133 (11/10). Continue to monitor  H/O VT  PAF: INR 1.43 (11/4) IV heparin per Pharm  Anemia stable. Fe deficient but withholding IV iron in setting of bacillus bacteremia  PTH: Elevated at 168: Calcitriol (0.25 mcg) PO qd  Bacillus bacteremia (11/1). ID following. On vanco (11/1>>); zosyn stopped (11/1>>11/3)  Needs Catheter Holiday - Will continue with CRRT for fluid removal as he is currently stable and plan to remove line when we attempt to transition to IHD  Cocaine abuse - none for 4 months     LOS: 12 days   [name] LowJames Joyner, [name] PGY-2, [name] [name] [name] [name] [name] [name] 06/20/2014, 9:00 AM   Patient seen and examined, agree with above note with above modifications. Stable and looks great on CRRT.  CVP is at goal, to stop CRRT today.    Then line holiday.  If renal function does not improve I am afraid that PD may not be feasable given above concerns, can do inpatient IHD while here- I want to talk to [name]. Gala [name] [name] about weaning dobutamine ?  Then see if could be transitioned to IHD  maybe 4 times a week at an OP unit ?  [name] [name] [name] 06/20/2014

## 2014-06-20 NOTE — Progress Notes (Addendum)
NUTRITION FOLLOW UP  Intervention:    Continue Nepro Shake po BID, each supplement provides 425 kcal and 19 grams protein  Nutritional services ambassador to assist patient with meal options  Nutrition Dx:   Inadequate oral intake related to poor appetite as evidenced by 50% meal completion. Ongoing.  Goal:   Intake to meet >90% of estimated nutrition needs.  Monitor:   PO intake, labs, weight trend.  Assessment:   63 year old with end stage CHF with EF of 15% who presented with hyperkalemia, pulmonary edema, respiratory failure and renal failure. Presented with SOB, respiratory failure and severe anasarca.  Patient continues on a Renal/CHO modified diet with 1200 ml fluid restriction. Per discussion with RN and patient, patient does not like some of the foods he is being served. He asked if there were additional options available for him. Provided a menu and discussed some additional meal options with him. Nutritional services ambassador to assist with meal options.  Patient has been receiving CRRT since 10/30 for volume control. Weight trending down with volume removal. Patient with increased protein and calorie needs for CRRT. He does not like the Nepro supplement, but is willing to drink it twice daily to help maximize oral intake.  Per discussion in ICU rounds, patient's HD catheter and all other lines have been removed. May need HD or PD if renal function does not improve.   Height: Ht Readings from Last 1 Encounters:  06/19/14 6' (1.829 m)    Weight Status:   Wt Readings from Last 1 Encounters:  06/20/14 216 lb 7.9 oz (98.2 kg)   06/13/14 235 lb 7.2 oz (106.8 kg)   06/08/14 280 lb (127.007 kg)    Re-estimated needs:  Kcal: 2300-2500 Protein: 150 gm Fluid: 2.3 L  Skin: intact  Diet Order: Diet renal W/1228mL fluid restriction   Intake/Output Summary (Last 24 hours) at 06/20/14 1015 Last data filed at 06/20/14 0900  Gross per 24 hour  Intake 2081.71 ml   Output   4079 ml  Net -1997.29 ml    Last BM: 11/9   Labs:   Recent Labs Lab 06/18/14 0238  06/19/14 0153 06/19/14 1600 06/20/14 0235  NA 134*  < > 128* 135* 133*  K 4.4  < > 4.2 4.0 4.4  CL 96  < > 94* 97 96  CO2 24  < > 22 24 24   BUN 11  < > 12 8 12   CREATININE 1.87*  < > 2.05* 1.44* 2.21*  CALCIUM 8.5  < > 8.5 8.2* 8.6  MG 2.5  --  2.4  --  2.5  PHOS 2.8  < > 2.8 1.8* 2.9  GLUCOSE 109*  < > 139* 180* 213*  < > = values in this interval not displayed.  CBG (last 3)   Recent Labs  06/19/14 1600 06/19/14 2142 06/20/14 0747  GLUCAP 224* 156* 154*    Scheduled Meds: . amiodarone  200 mg Oral Daily  . antiseptic oral rinse  7 mL Mouth Rinse BID  . calcitRIOL  0.25 mcg Oral Daily  . feeding supplement (NEPRO CARB STEADY)  237 mL Oral BID BM  . insulin aspart  0-9 Units Subcutaneous TID WC  . pantoprazole  40 mg Oral Daily  . vancomycin  1,250 mg Intravenous Q24H    Continuous Infusions: . sodium chloride 10 mL/hr at 06/20/14 0020  . DOBUTamine 7.5 mcg/kg/min (06/20/14 0414)  . heparin 1,050 Units/hr (06/20/14 0612)    Joaquin Courts, RD, LDN,  CNSC Pager [phone] After Hours Pager [phone]

## 2014-06-20 NOTE — Progress Notes (Addendum)
Patient ID: [name] MaceKenneth [name], male   DOB: May 30, 1951, 63 y.o.   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 Maintained on dobutamine 65mcg/kg/min.   Past medical h/o is notable for CKD with baseline Cr 3.0, cocaine abuse, DM2, AF on amio, VT. He has not been candidate for advanced therapies due to cocaine abuse (quit 4 months ago) and renal failure. Started on dobutamine. [place]oved to [place] to try to stay off cocaine.   On Oct 14 [facility]. CR was 3.3 (on 04/16/2014 [facility]) and potassium was low. Kcl was increased. Over past week increased dyspnea and edema. NYHA IIIB symptoms at baseline.   10/30 Admitted with rep failure with wide complex bradycardia. K 6.8 and Cr 4.6. Intubated and CVVHD started.  Extubated 11/1.  Continues on CVVHD.  Bcx + for bacillus. ID requesting line holiday.  Feels fine. Weight down 35 pounds total. CVP now 9. Dobutamine at 5.  SPB in 80-100.  U/o < 10cc/hr.   Objective:   Weight Range:  Vital Signs:   Temp:  [97.5 F (36.4 C)-98.1 F (36.7 C)] 98 F (36.7 C) (11/11 0759) Pulse Rate:  [88-100] 92 (11/11 0615) Resp:  [8-29] 17 (11/11 0615) BP: (65-95)/(51-66) 83/55 mmHg (11/11 0600) SpO2:  [94 %-100 %] 99 % (11/11 0615) Weight:  [98.2 kg (216 lb 7.9 oz)] 98.2 kg (216 lb 7.9 oz) (11/11 0415) Last BM Date: 06/18/14  Weight change: Filed Weights   06/18/14 0400 06/19/14 0400 06/20/14 0415  Weight: 98.9 kg (218 lb 0.6 oz) 96.9 kg (213 lb 10 oz) 98.2 kg (216 lb 7.9 oz)    Intake/Output:   Intake/Output Summary (Last 24 hours) at 06/20/14 0828 Last data filed at 06/20/14 0800  Gross per 24 hour  Intake 2304.11 ml  Output   4361 ml  Net -2056.89 ml     Physical Exam: CVP 9 General: NAD  Awake in bed. Bair hugger in place HEENT: normal  Neck: supple. JVP 9 Carotids 2+ bilat; no bruits. No lymphadenopathy or  thryomegaly appreciated.  Cor: PMI laterally displaced. Regular rate & rhythm. 2/6 MR +s3  Lungs: clear Abdomen: soft, nontender, nondistended. No hepatosplenomegaly. No bruits or masses. Good bowel sounds.  Extremities: no cyanosis, clubbing, rash, tr edemaSCDs femoral trialysis catheter Neuro: alert & orientedx3, cranial nerves grossly intact. moves all 4 extremities w/o difficulty. Affect pleasant   Telemetry: SR 90s  Labs: Basic Metabolic Panel:  Recent Labs Lab 06/16/14 0249  06/17/14 0230  06/18/14 0238 06/18/14 1600 06/19/14 0153 06/19/14 1600 06/20/14 0235  NA  --   < > 134*  < > 134* 134* 128* 135* 133*  K  --   < > 4.4  < > 4.4 4.4 4.2 4.0 4.4  CL  --   < > 97  < > 96 96 94* 97 96  CO2  --   < > 23  < > 24 25 22 24 24   GLUCOSE  --   < > 118*  < > 109* 209* 139* 180* 213*  BUN  --   < > 10  < > 11 9 12 8 12   CREATININE  --   < > 1.99*  < > 1.87* 1.50* 2.05* 1.44* 2.21*  CALCIUM  --   < > 8.4  < > 8.5 8.2* 8.5 8.2* 8.6  MG 2.6*  --  2.6*  --  2.5  --  2.4  --  2.5  PHOS  --   < > 2.6  < > 2.8 1.8* 2.8 1.8* 2.9  < > = values in this interval not displayed.  Liver Function Tests:  Recent Labs Lab 06/18/14 0238 06/18/14 1600 06/19/14 0153 06/19/14 1600 06/20/14 0235  ALBUMIN 2.8* 2.7* 2.6* 2.7* 2.7*   No results for input(s): LIPASE, AMYLASE in the last 168 hours. No results for input(s): AMMONIA in the last 168 hours.  CBC:  Recent Labs Lab 06/14/14 0551 06/15/14 0526 06/16/14 0249 06/17/14 0230 06/18/14 0238 06/19/14 0153 06/20/14 0235  WBC 6.2 6.8 6.2 6.2 6.4 7.0 6.8  NEUTROABS 4.0 4.5  --   --   --   --   --   HGB 11.3* 11.0* 10.6* 9.9* 9.9* 9.6* 10.3*  HCT 37.1* 35.0* 34.1* 31.4* 31.5* 30.9* 32.4*  MCV 77.6* 76.8* 78.9 77.1* 77.4* 77.1* 77.0*  PLT 139* 134* 130* 130* 169 166 184    Cardiac Enzymes: No results for input(s): CKTOTAL, CKMB, CKMBINDEX, TROPONINI in the last 168 hours.  BNP: BNP (last 3 results)  Recent Labs   06/08/14 1040 06/12/14 0450  PROBNP 11335.0* 6716.0*     Other results:    Imaging: No results found.   Medications:     Scheduled Medications: . amiodarone  200 mg Oral Daily  . antiseptic oral rinse  7 mL Mouth Rinse BID  . calcitRIOL  0.25 mcg Oral Daily  . feeding supplement (NEPRO CARB STEADY)  237 mL Oral BID BM  . insulin aspart  0-9 Units Subcutaneous TID WC  . pantoprazole  40 mg Oral Daily  . sodium phosphate  Dextrose 5% IVPB  10 mmol Intravenous Q12H  . vancomycin  1,250 mg Intravenous Q24H    Infusions: . sodium chloride 10 mL/hr at 06/20/14 0020  . DOBUTamine 7.5 mcg/kg/min (06/20/14 0414)  . heparin 10,000 units/ 20 mL infusion syringe 850 Units/hr (06/19/14 2304)  . heparin 1,050 Units/hr (06/20/14 0612)  . dialysis replacement fluid (prismasate) 800 mL/hr at 06/20/14 0347  . dialysis replacement fluid (prismasate) 700 mL/hr at 06/20/14 0445  . dialysate (PRISMASATE) 2,000 mL/hr at 06/20/14 0700    PRN Medications: sodium chloride, acetaminophen, heparin, heparin, sodium chloride   Assessment:   1. Acute respiratory failure  2. A/c systolic HF due to NICM EF < 15%  3. Acute on chronic renal failure stage IV  4. Hyperkalemia  5. Cardiogenic shock  6. DM2  7. Cocaine abuse in remission 3-4 months  8. H/o gout  9. Supratherapeutic INR  10. LV thrombus  11. PAF on amio  12. H/o VT 13. Fever 11/1   Plan/Discussion:    Volume status improving. CVP down to 9. I think we can pull CVVHD catheter and PICC. Give dobutamine through peripheral IV for IV holiday.   If kidneys don't recover will need to figure out a method to dialyze him while on dobutamine. Dr. Kathrene Bongo has raised issue of PD or home iHD on dobutamine. I think both of these options are reasonable and I suspect he would tolerate iHD on dobutamine. Will await their thoughts.   Daniel Bensimhon,MD 8:28 AM

## 2014-06-20 NOTE — Progress Notes (Signed)
[facility]      Subjective: Lines are out   Antibiotics:  Anti-infectives    Start     Dose/Rate Route Frequency Ordered Stop   06/18/14 1000  vancomycin (VANCOCIN) 1,250 mg in sodium chloride 0.9 % 150 mL IVPB     1,250 mg100 mL/hr over 90 Minutes Intravenous Every 24 hours 06/17/14 1134     06/14/14 0800  vancomycin (VANCOCIN) 1,500 mg in sodium chloride 0.9 % 250 mL IVPB  Status:  Discontinued     1,500 mg125 mL/hr over 120 Minutes Intravenous Every 24 hours 06/13/14 1528 06/17/14 1134   06/10/14 1000  piperacillin-tazobactam (ZOSYN) IVPB 3.375 g  Status:  Discontinued     3.375 g100 mL/hr over 30 Minutes Intravenous Every 6 hours 06/10/14 0918 06/12/14 1318   06/10/14 1000  vancomycin (VANCOCIN) 1,250 mg in sodium chloride 0.9 % 250 mL IVPB  Status:  Discontinued     1,250 mg166.7 mL/hr over 90 Minutes Intravenous Every 24 hours 06/10/14 0918 06/13/14 1528      Medications: Scheduled Meds: . amiodarone  200 mg Oral Daily  . antiseptic oral rinse  7 mL Mouth Rinse BID  . calcitRIOL  0.25 mcg Oral Daily  . feeding supplement (NEPRO CARB STEADY)  237 mL Oral BID BM  . insulin aspart  0-9 Units Subcutaneous TID WC  . pantoprazole  40 mg Oral Daily  . vancomycin  1,250 mg Intravenous Q24H   Continuous Infusions: . sodium chloride 10 mL/hr at 06/20/14 0020  . DOBUTamine 7.5 mcg/kg/min (06/20/14 0414)  . heparin 1,050 Units/hr (06/20/14 0612)   PRN Meds:.sodium chloride, acetaminophen    Objective: Weight change: 2 lb 13.9 oz (1.3 kg)  Intake/Output Summary (Last 24 hours) at 06/20/14 1809 Last data filed at 06/20/14 1400  Gross per 24 hour  Intake 1393.61 ml  Output   2211 ml  Net -817.39 ml   Blood pressure 89/59, pulse 104, temperature 98.5 F (36.9 C), temperature source Oral, resp. rate 23, height 6' (1.829 m), weight 216 lb 7.9 oz (98.2 kg), SpO2 99 %. Temp:  [97.8 F (36.6 C)-98.5 F (36.9 C)] 98.5 F (36.9 C) (11/11  1559) Pulse Rate:  [88-114] 104 (11/11 1400) Resp:  [8-29] 23 (11/11 1400) BP: (80-96)/(51-68) 89/59 mmHg (11/11 1400) SpO2:  [94 %-100 %] 99 % (11/11 1400) Weight:  [216 lb 7.9 oz (98.2 kg)] 216 lb 7.9 oz (98.2 kg) (11/11 0415)  Physical Exam: General: Alert and awake, oriented x3, under a cooling blanket HEENT: anicteric sclera,  EOMI, oropharynx clear and without exudate CVS tachycardic raterate, 2/6 systolic murmur at PMI Chest: clear to auscultation bilaterally anteriorly Abdomen: soft nontender,distended, normal bowel sounds, Extremities: 2+ edema noted bilaterally Neuro: nonfocal, strength and sensation intact   CBC:  CBC Latest Ref Rng 06/20/2014 06/19/2014 06/18/2014  WBC 4.0 - 10.5 K/uL 6.8 7.0 6.4  Hemoglobin 13.0 - 17.0 g/dL 10.3(L) 9.6(L) 9.9(L)  Hematocrit 39.0 - 52.0 % 32.4(L) 30.9(L) 31.5(L)  Platelets 150 - 400 K/uL 184 166 169      BMET  Recent Labs  06/19/14 1600 06/20/14 0235  NA 135* 133*  K 4.0 4.4  CL 97 96  CO2 24 24  GLUCOSE 180* 213*  BUN 8 12  CREATININE 1.44* 2.21*  CALCIUM 8.2* 8.6     Liver Panel   Recent Labs  06/19/14 1600 06/20/14 0235  ALBUMIN 2.7* 2.7*       Sedimentation Rate No results for input(s):  ESRSEDRATE in the last 72 hours. C-Reactive Protein No results for input(s): CRP in the last 72 hours.  Micro Results: Recent Results (from the past 720 hour(s))  MRSA PCR Screening     Status: None   Collection Time: 06/08/14  1:16 PM  Result Value Ref Range Status   MRSA by PCR NEGATIVE NEGATIVE Final    Comment:        The GeneXpert MRSA Assay (FDA approved for NASAL specimens only), is one component of a comprehensive MRSA colonization surveillance program. It is not intended to diagnose MRSA infection nor to guide or monitor treatment for MRSA infections.  Urine culture     Status: None   Collection Time: 06/09/14 10:32 AM  Result Value Ref Range Status   Specimen Description URINE, CATHETERIZED   Final   Special Requests Immunocompromised  Final   Culture  Setup Time   Final    06/09/2014 20:50 Performed at [facility]    Colony Count NO GROWTH Performed at [facility]   Final   Culture NO GROWTH Performed at [facility]   Final   Report Status 06/10/2014 FINAL  Final  Culture, blood (routine x 2)     Status: None   Collection Time: 06/10/14  3:39 AM  Result Value Ref Range Status   Specimen Description BLOOD LEFT HAND  Final   Special Requests BOTTLES DRAWN AEROBIC ONLY 4CC  Final   Culture  Setup Time   Final    06/10/2014 17:58 Performed at [facility]    Culture   Final    BACILLUS SPECIES Note: Standardized susceptibility testing for this organism is not available. Note: Gram Stain Report Called to,Read Back By and Verified With: BRYCE SHEPARD @ 3:58AM 11.2.15 BY DESIS Performed at [facility]    Report Status 06/12/2014 FINAL  Final  Culture, blood (routine x 2)     Status: None   Collection Time: 06/10/14  3:52 AM  Result Value Ref Range Status   Specimen Description BLOOD LEFT HAND  Final   Special Requests BOTTLES DRAWN AEROBIC ONLY 8CC  Final   Culture  Setup Time   Final    06/10/2014 17:58 Performed at [facility]    Culture   Final    BACILLUS SPECIES Note: Standardized susceptibility testing for this organism is not available. Note: Gram Stain Report Called to,Read Back By and Verified With: BRYCE SHEPARD @ 3:58AM 11.2.15 BY DESIS Culture results may be compromised due to an excessive volume of blood received in culture bottles. Performed at [facility]    Report Status 06/12/2014 FINAL  Final  Culture, blood (routine x 2)     Status: None   Collection Time: 06/12/14  1:54 PM  Result Value Ref Range Status   Specimen Description BLOOD LEFT HAND  Final   Special Requests   Final    BOTTLES DRAWN AEROBIC AND ANAEROBIC BLUE 10CC RED 5CC   Culture  Setup Time   Final    06/12/2014  20:35 Performed at [facility]    Culture   Final    NO GROWTH 5 DAYS Performed at [facility]    Report Status 06/18/2014 FINAL  Final  Culture, blood (routine x 2)     Status: None   Collection Time: 06/12/14  2:05 PM  Result Value Ref Range Status   Specimen Description BLOOD LEFT ARM  Final   Special Requests BOTTLES DRAWN AEROBIC AND  ANAEROBIC 10CC  Final   Culture  Setup Time   Final    06/12/2014 20:35 Performed at [facility]    Culture   Final    NO GROWTH 5 DAYS Performed at [facility]    Report Status 06/18/2014 FINAL  Final    Studies/Results: No results found.    Assessment/Plan:  Active Problems:   Acute respiratory failure   Cardiogenic shock   Acute on chronic systolic congestive heart failure   Acute on chronic renal failure   Hyperkalemia   Respiratory failure   Bacteremia associated with intravascular line   Blood poisoning   Acute respiratory failure with hypoxia   AKI (acute kidney injury)   Screen for STD (sexually transmitted disease)   Pulmonary edema    [name] is a 63 y.o. male with PICC line at home with dobutamine infusion now with admission for CHF, renal failure, hyperkalemia, bradycardia, fevers and found to be bacteremic with Bacillus (likely cereus)  #1 Bacillus Cereus bacteremia: likely due to his improper non sterile manipulation of his line when he was changing infusion site. 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 Lab does not speciate beyond saying this non anthrax Bacillus and an aerobe, no sensis available  -- narrowed to vancomycin, given resistance to penicillin found with this organism' -- LINES ARE OUT --check BLOOD CULTURES from 2 sites tomorrow am --OK TO PLACE CENTRAL LINE FOR HIS DOBUTAMINE AND FOR HIS VANCOMYCIN AFTER HIS 48 HOUR "line holiday" --would treat with IV  vancomycin for 2 weeks with target trough of 15-20, and today being day # 1      LOS: 12 days   Acey Lav 06/20/2014, 6:09 PM

## 2014-06-20 NOTE — Evaluation (Signed)
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  Over past week increased dyspnea and edema.     Clinical Impression  Pt admitted with generalized weakness from hospital stay and acute events. Pt currently with functional limitations due to the deficits listed below (see PT Problem List). Pt ambulated 66' with RW and min A, very fatigued after ambualtion. Pt will benefit from skilled PT to increase their independence and safety with mobility to allow discharge to the venue listed below. PT will continue to follow.       Follow Up Recommendations Home health PT;Supervision - Intermittent    Equipment Recommendations  Rolling walker with 5" wheels    Recommendations for Other Services OT consult     Precautions / Restrictions Precautions Precautions: Fall Restrictions Weight Bearing Restrictions: No      Mobility  Bed Mobility Overal bed mobility: Modified Independent                Transfers Overall transfer level: Needs assistance Equipment used: None Transfers: Sit to/from Stand Sit to Stand: Min assist         General transfer comment: min A to steady as pt's knees felt buckly in standing  Ambulation/Gait Ambulation/Gait assistance: Min assist Ambulation Distance (Feet): 75 Feet Assistive device: Rolling walker (2 wheeled) Gait Pattern/deviations: Step-through pattern;Decreased stride length Gait velocity: decreased   General Gait Details: pt's pace decreased with distance, fatigued quickly and cued to return to room. Decreased step height and fatigue. RW used to prevent knee buckling  Stairs            Wheelchair Mobility    Modified Rankin  (Stroke Patients Only)       Balance Overall balance assessment: Needs assistance Sitting-balance support: No upper extremity supported;Feet supported Sitting balance-Leahy Scale: Good     Standing balance support: No upper extremity supported;During functional activity Standing balance-Leahy Scale: Fair Standing balance comment: unsteady without UW support                             Pertinent Vitals/Pain Pain Assessment: Faces Faces Pain Scale: Hurts a little bit Pain Location: right hand and wrist Pain Intervention(s): Monitored during session  O2 sats 100%, HR 100 bpm    Home Living Family/patient expects to be discharged to:: Private residence Living Arrangements: Other relatives Available Help at Discharge: Family;Available PRN/intermittently Type of Home: House Home Access: Stairs to enter   [employer] of Steps: 2 Home Layout: One level Home Equipment: None Additional Comments: pt lives with niece who is blind. 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     Hand Dominance        Extremity/Trunk Assessment   Upper Extremity Assessment: Defer to OT evaluation           Lower Extremity Assessment: Generalized weakness      Cervical / Trunk Assessment: Normal  Communication   Communication: No difficulties  Cognition Arousal/Alertness: Awake/alert Behavior During Therapy: WFL for tasks assessed/performed Overall Cognitive Status: Within Functional Limits  for tasks assessed                      General Comments      Exercises General Exercises - Lower Extremity Ankle Circles/Pumps: AROM;Both;10 reps;Seated      Assessment/Plan    PT Assessment Patient needs continued PT services  PT Diagnosis Difficulty walking;Generalized weakness   PT Problem List Decreased strength;Decreased activity tolerance;Decreased  balance;Decreased mobility;Decreased knowledge of use of DME;Decreased knowledge of precautions  PT Treatment Interventions DME instruction;Gait training;Stair training;Functional mobility training;Therapeutic activities;Therapeutic exercise;Balance training;Patient/family education   PT Goals (Current goals can be found in the Care Plan section) Acute Rehab PT Goals Patient Stated Goal: return home once stronger PT Goal Formulation: With patient Time For Goal Achievement: 07/04/14 Potential to Achieve Goals: Good    Frequency Min 3X/week   Barriers to discharge Decreased caregiver support      Co-evaluation               End of Session Equipment Utilized During Treatment: Gait belt Activity Tolerance: Patient tolerated treatment well;Patient limited by fatigue Patient left: in chair;with call bell/phone within reach Nurse Communication: Mobility status         Time: [id number] PT Time Calculation (min) (ACUTE ONLY): 21 min   Charges:   PT Evaluation $Initial PT Evaluation Tier I: 1 Procedure PT Treatments $Gait Training: 8-22 mins   PT G Codes:         Lyanne CoVictoria Gavyn Zoss, PT  Acute Rehab Services  [phone]  [place], [place] 06/20/2014, 12:12 PM

## 2014-06-21 DIAGNOSIS — N186 End stage renal disease: Secondary | ICD-10-CM | POA: Insufficient documentation

## 2014-06-21 DIAGNOSIS — Z992 Dependence on renal dialysis: Secondary | ICD-10-CM

## 2014-06-21 LAB — RENAL FUNCTION PANEL
ANION GAP: 14 (ref 5–15)
Albumin: 2.6 g/dL — ABNORMAL LOW (ref 3.5–5.2)
BUN: 23 mg/dL (ref 6–23)
CALCIUM: 8.7 mg/dL (ref 8.4–10.5)
CO2: 22 meq/L (ref 19–32)
Chloride: 93 mEq/L — ABNORMAL LOW (ref 96–112)
Creatinine, Ser: 3.76 mg/dL — ABNORMAL HIGH (ref 0.50–1.35)
GFR, EST AFRICAN AMERICAN: 18 mL/min — AB (ref >90)
GFR, EST NON AFRICAN AMERICAN: 16 mL/min — AB (ref >90)
Glucose, Bld: 133 mg/dL — ABNORMAL HIGH (ref 70–99)
PHOSPHORUS: 3.6 mg/dL (ref 2.3–4.6)
POTASSIUM: 4.2 meq/L (ref 3.7–5.3)
SODIUM: 129 meq/L — AB (ref 137–147)

## 2014-06-21 LAB — HEPARIN LEVEL (UNFRACTIONATED)
Heparin Unfractionated: <0.1 IU/mL — ABNORMAL LOW (ref 0.30–0.70)
Heparin Unfractionated: <0.1 IU/mL — ABNORMAL LOW (ref 0.30–0.70)
Heparin Unfractionated: 0.17 IU/mL — ABNORMAL LOW (ref 0.30–0.70)

## 2014-06-21 LAB — GLUCOSE, CAPILLARY
GLUCOSE-CAPILLARY: 195 mg/dL — AB (ref 70–99)
Glucose-Capillary: 132 mg/dL — ABNORMAL HIGH (ref 70–99)
Glucose-Capillary: 184 mg/dL — ABNORMAL HIGH (ref 70–99)
Glucose-Capillary: 138 mg/dL — ABNORMAL HIGH (ref 70–99)

## 2014-06-21 LAB — PROTIME-INR
INR: 1.33 (ref 0.00–1.49)
PROTHROMBIN TIME: 16.6 s — AB (ref 11.6–15.2)

## 2014-06-21 LAB — CBC
HCT: 28.6 % — ABNORMAL LOW (ref 39.0–52.0)
Hemoglobin: 9.1 g/dL — ABNORMAL LOW (ref 13.0–17.0)
MCH: 24.2 pg — AB (ref 26.0–34.0)
MCHC: 31.8 g/dL (ref 30.0–36.0)
MCV: 76.1 fL — ABNORMAL LOW (ref 78.0–100.0)
PLATELETS: 214 10*3/uL (ref 150–400)
RBC: 3.76 MIL/uL — ABNORMAL LOW (ref 4.22–5.81)
RDW: 20.9 % — AB (ref 11.5–15.5)
WBC: 6.8 10*3/uL (ref 4.0–10.5)

## 2014-06-21 MED ORDER — VANCOMYCIN HCL 10 G IV SOLR
1250.0000 mg | INTRAVENOUS | Status: DC
  Administered 2014-06-21 – 2014-06-22 (×2): 1250 mg via INTRAVENOUS
  Filled 2014-06-21 (×4): qty 1250

## 2014-06-21 MED ORDER — DARBEPOETIN ALFA 60 MCG/0.3ML IJ SOSY
60.0000 ug | PREFILLED_SYRINGE | INTRAMUSCULAR | Status: DC
  Administered 2014-06-22: 60 ug via INTRAVENOUS
  Filled 2014-06-21: qty 0.3

## 2014-06-21 NOTE — Progress Notes (Signed)
ANTICOAGULATION CONSULT NOTE - Follow Up Consult  Pharmacy Consult for heparin Indication: atrial fibrillation and apical thrombus  Labs:  Recent Labs  06/19/14 0153 06/19/14 1600 06/20/14 0235 06/21/14 0236 06/21/14 0514  HGB 9.6*  --  10.3* 9.1*  --   HCT 30.9*  --  32.4* 28.6*  --   PLT 166  --  184 214  --   APTT 137*  --  152*  --   --   LABPROT 17.4*  --  17.4* 16.6*  --   INR 1.41  --  1.41 1.33  --   HEPARINUNFRC 0.53  --  0.56 <0.10* <0.10*  CREATININE 2.05* 1.44* 2.21* 3.76*  --     Assessment: 63yo male undetectable on heparin after very stable levels at current rate, CRRT now d/c'd which may have affected levels.  Goal of Therapy:  Heparin level 0.3-0.7 units/ml   Plan:  Will increase heparin gtt by 4 units/kg/hr to 1400 unitshr and check level in 8hr.  Vernard Gambles, PharmD, BCPS  06/21/2014,6:06 AM

## 2014-06-21 NOTE — Progress Notes (Signed)
Patient ID: [name], male   DOB: [birth year], 63 y.o.   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 Maintained on dobutamine 25mcg/kg/min.   Past medical h/o is notable for CKD with baseline Cr 3.0, cocaine abuse, DM2, AF on amio, VT. He has not been candidate for advanced therapies due to cocaine abuse (quit 4 months ago) and renal failure. Started on dobutamine. Moved to [place] to try to stay off cocaine.   On Oct 14 seen at [facility]. CR was 3.3 (on 04/16/2014 Cr was 1.6 at [facility] on discharge) and potassium was low. Kcl was increased. Over past week increased dyspnea and edema. NYHA IIIB symptoms at baseline.   10/30 Admitted with rep failure with wide complex bradycardia. K 6.8 and Cr 4.6. Intubated and CVVHD started.  Extubated 11/1.  Continues on CVVHD.  Bcx + for bacillus. ID requesting line holiday.  CVVHD stopped on 11/11 after 35 pound removal. All central catheters out for line holiday. Making just a little urine. Dobutamine weaned from 5 to 2.5 yesterday. SBP mostly in 80s. CR up to 3.76. Feels ok.   Objective:   Weight Range:  Vital Signs:   Temp:  [97.7 F (36.5 C)-98.5 F (36.9 C)] 97.7 F (36.5 C) (11/12 1146) Pulse Rate:  [87-104] 97 (11/12 1000) Resp:  [11-29] 24 (11/12 1000) BP: (67-102)/(50-71) 72/55 mmHg (11/12 1000) SpO2:  [96 %-100 %] 100 % (11/12 1000) Weight:  [95.6 kg (210 lb 12.2 oz)] 95.6 kg (210 lb 12.2 oz) (11/12 0249) Last BM Date: 06/20/14  Weight change: Filed Weights   06/19/14 0400 06/20/14 0415 06/21/14 0249  Weight: 96.9 kg (213 lb 10 oz) 98.2 kg (216 lb 7.9 oz) 95.6 kg (210 lb 12.2 oz)    Intake/Output:   Intake/Output Summary (Last 24 hours) at 06/21/14 1303 Last data filed at 06/21/14 1000  Gross per 24 hour  Intake 987.32 ml  Output     10 ml  Net 977.32 ml     Physical Exam: CVP 8 General: NAD  Awake in bed.  Bair hugger in place HEENT: normal  Neck: supple. JVP 8 Carotids 2+ bilat; no bruits. No lymphadenopathy or thryomegaly appreciated.  Cor: PMI laterally displaced. Regular rate & rhythm. 2/6 MR +s3  Lungs: clear Abdomen: soft, nontender, nondistended. No hepatosplenomegaly. No bruits or masses. Good bowel sounds.  Extremities: no cyanosis, clubbing, rash, tr edema Neuro: alert & orientedx3, cranial nerves grossly intact. moves all 4 extremities w/o difficulty. Affect pleasant   Telemetry: SR 90s  Labs: Basic Metabolic Panel:  Recent Labs Lab 06/16/14 0249  06/17/14 0230  06/18/14 0238 06/18/14 1600 06/19/14 0153 06/19/14 1600 06/20/14 0235 06/21/14 0236  NA  --   < > 134*  < > 134* 134* 128* 135* 133* 129*  K  --   < > 4.4  < > 4.4 4.4 4.2 4.0 4.4 4.2  CL  --   < > 97  < > 96 96 94* 97 96 93*  CO2  --   < > 23  < > 24 25 22 24 24 22   GLUCOSE  --   < > 118*  < > 109* 209* 139* 180* 213* 133*  BUN  --   < > 10  < > 11 9 12 8 12 23   CREATININE  --   < > 1.99*  < >  1.87* 1.50* 2.05* 1.44* 2.21* 3.76*  CALCIUM  --   < > 8.4  < > 8.5 8.2* 8.5 8.2* 8.6 8.7  MG 2.6*  --  2.6*  --  2.5  --  2.4  --  2.5  --   PHOS  --   < > 2.6  < > 2.8 1.8* 2.8 1.8* 2.9 3.6  < > = values in this interval not displayed.  Liver Function Tests:  Recent Labs Lab 06/18/14 1600 06/19/14 0153 06/19/14 1600 06/20/14 0235 06/21/14 0236  ALBUMIN 2.7* 2.6* 2.7* 2.7* 2.6*   No results for input(s): LIPASE, AMYLASE in the last 168 hours. No results for input(s): AMMONIA in the last 168 hours.  CBC:  Recent Labs Lab 06/15/14 0526  06/17/14 0230 06/18/14 0238 06/19/14 0153 06/20/14 0235 06/21/14 0236  WBC 6.8  < > 6.2 6.4 7.0 6.8 6.8  NEUTROABS 4.5  --   --   --   --   --   --   HGB 11.0*  < > 9.9* 9.9* 9.6* 10.3* 9.1*  HCT 35.0*  < > 31.4* 31.5* 30.9* 32.4* 28.6*  MCV 76.8*  < > 77.1* 77.4* 77.1* 77.0* 76.1*  PLT 134*  < > 130* 169 166 184 214  < > = values in this interval not  displayed.  Cardiac Enzymes: No results for input(s): CKTOTAL, CKMB, CKMBINDEX, TROPONINI in the last 168 hours.  BNP: BNP (last 3 results)  Recent Labs  06/08/14 1040 06/12/14 0450  PROBNP 11335.0* 6716.0*     Other results:    Imaging: No results found.   Medications:     Scheduled Medications: . amiodarone  200 mg Oral Daily  . calcitRIOL  0.25 mcg Oral Daily  . [START ON 06/22/2014] darbepoetin (ARANESP) injection - DIALYSIS  60 mcg Intravenous Q Fri-HD  . feeding supplement (NEPRO CARB STEADY)  237 mL Oral BID BM  . insulin aspart  0-9 Units Subcutaneous TID WC  . pantoprazole  40 mg Oral Daily  . vancomycin  1,250 mg Intravenous Q24H    Infusions: . sodium chloride 10 mL/hr at 06/20/14 0020  . DOBUTamine 2.5 mcg/kg/min (06/21/14 0541)  . heparin 1,400 Units/hr (06/21/14 0605)    PRN Medications: sodium chloride, acetaminophen   Assessment:   1. Acute respiratory failure  2. A/c systolic HF due to NICM EF < 15%  3. Acute on chronic renal failure stage IV  4. Hyperkalemia  5. Cardiogenic shock  6. DM2  7. Cocaine abuse in remission 3-4 months  8. H/o gout  9. Supratherapeutic INR  10. LV thrombus  11. PAF on amio  12. H/o VT 13. Fever 11/1   Plan/Discussion:    Now off CVVHD. Making only a little urine. Creatinine up significantly. Tolerating dobutamine wean for now. Will try to wean dobutamine. Need to keep SBP >=80.  Dialysis catheter to be replaced tomorrow (after line holiday).  If cant wean dobutamine completely or fails iHD will need to consider home iHD on dobutamine (versus PD)  If bcx are repeat + will need TEE.   Daniel Bensimhon,MD 1:03 PM

## 2014-06-21 NOTE — Progress Notes (Signed)
ANTICOAGULATION CONSULT NOTE - Follow Up Consult  Pharmacy Consult for Heparin Indication: atrial fibrillation, apical thrombus  No Known Allergies  Patient Measurements: Height: 6' (182.9 cm) Weight: 210 lb 12.2 oz (95.6 kg) IBW/kg (Calculated) : 77.6 Heparin Dosing Weight: 98 kg  Vital Signs: Temp: 97.7 F (36.5 C) (11/12 1146) Temp Source: Oral (11/12 1146) BP: 91/66 mmHg (11/12 1300) Pulse Rate: 94 (11/12 1300)  Labs:  Recent Labs  06/19/14 0153 06/19/14 1600 06/20/14 0235 06/21/14 0236 06/21/14 0514 06/21/14 1455  HGB 9.6*  --  10.3* 9.1*  --   --   HCT 30.9*  --  32.4* 28.6*  --   --   PLT 166  --  184 214  --   --   APTT 137*  --  152*  --   --   --   LABPROT 17.4*  --  17.4* 16.6*  --   --   INR 1.41  --  1.41 1.33  --   --   HEPARINUNFRC 0.53  --  0.56 <0.10* <0.10* 0.17*  CREATININE 2.05* 1.44* 2.21* 3.76*  --   --     Estimated Creatinine Clearance: 23.5 mL/min (by C-G formula based on Cr of 3.76).   Medications:  Infusions:  . sodium chloride 10 mL/hr at 06/20/14 0020  . DOBUTamine 1.5 mcg/kg/min (06/21/14 1450)  . heparin 1,400 Units/hr (06/21/14 7680)    Assessment: 63 year old male on anticoagulation with heparin for atrial fibrillation and apical thrombus.  Coumadin remains on hold- INR 1.33. Heparin level remains sub-therapeutic but has trended up to 0.17. Hgb 9.1. Platelets are stable. No bleeding noted.   Goal of Therapy:  Heparin level 0.3-0.7 units/ml Monitor platelets by anticoagulation protocol: Yes    Plan:  Increase Heparin to 1750 units/hr F/u 8 hour HL Daily heparin level and CBC Consider restart of Coumadin therapy if appropriate  Vinnie Level, PharmD.  Clinical Pharmacist Pager [phone]

## 2014-06-21 NOTE — Progress Notes (Signed)
[facility] Rounding Note  Subjective: Continues to be without complaint. Denies CP or SOB. CRRT stopped 11/11. He reports urinating a little bit this morning which was bloody, but it wasn't measured- he said less than 50 ml. He denies feeling any different since stopping CRRT.   Objective: Vital signs in last 24 hours: Temp:  [97.7 F (36.5 C)-98.5 F (36.9 C)] 97.8 F (36.6 C) (11/12 0751) Pulse Rate:  [87-114] 90 (11/12 0700) Resp:  [11-29] 11 (11/12 0700) BP: (67-102)/(50-71) 102/67 mmHg (11/12 0700) SpO2:  [96 %-100 %] 100 % (11/12 0700) Weight:  [210 lb 12.2 oz (95.6 kg)] 210 lb 12.2 oz (95.6 kg) (11/12 0249) Weight change: -5 lb 11.7 oz (-2.6 kg)  Intake/Output from previous day: 11/11 0701 - 11/12 0700 In: 1811.6 [P.O.:1031; I.V.:630.6; IV Piggyback:150] Out: 690 [Urine:15]  Net - 25 L since admit  Weight Trending 10/30  112.4 kg 10/31  114.1 kg 11/1    113.5 kg 11/2    111.2 kg     11/3     108.5 kg   11/4     106.8 kg   11/5     106.4 kg 11/6    102.4 kg 11/7    102.9 kg 11/8    100.2 kg 11/9     98.9 kg 11/10   96.6 kg 11/11    98.2 kg 11/12    95.6 kg    Physical Examination: BP 102/67 mmHg  Pulse 90  Temp(Src) 97.8 F (36.6 C) (Oral)  Resp 11  Ht 6' (1.829 m)  Wt 210 lb 12.2 oz (95.6 kg)  BMI 28.58 kg/m2  SpO2 100%  Awake; A&Ox3 RRR; S1S2 + s3 w/ 2/6 systolic murmur Lungs CTAB, no crackles heard Trace edema at calves   Recent Labs  06/20/14 0235 06/21/14 0236  WBC 6.8 6.8  HGB 10.3* 9.1*  HCT 32.4* 28.6*  PLT 184 214    Recent Labs  06/20/14 0235 06/21/14 0236  NA 133* 129*  K 4.4 4.2  CL 96 93*  CO2 24 22  GLUCOSE 213* 133*  BUN 12 23  CREATININE 2.21* 3.76*  CALCIUM 8.6 8.7    Recent Labs  06/19/14 0153  06/20/14 0235 06/21/14 0236  PHOS 2.8  < > 2.9 3.6  MG 2.4  --  2.5  --   < > = values in this interval not displayed.   Results for Trevor MaceJEFFRIES, Melo (MRN [id number]) as of 06/11/2014 07:18  Ref.  Range 06/09/2014 07:30  Iron Latest Range: 42-135 ug/dL 21 (L)  UIBC Latest Range: 125-400 ug/dL [id number]  TIBC Latest Range: 215-435 ug/dL [id number]  Saturation Ratios Latest Range: 20-55 % 6 (L)    Studies/Results: No results found. Scheduled Medications . amiodarone  200 mg Oral Daily  . calcitRIOL  0.25 mcg Oral Daily  . feeding supplement (NEPRO CARB STEADY)  237 mL Oral BID BM  . insulin aspart  0-9 Units Subcutaneous TID WC  . pantoprazole  40 mg Oral Daily  . vancomycin  1,250 mg Intravenous Q24H   Infusion/other Medications . sodium chloride 10 mL/hr at 06/20/14 0020  . DOBUTamine 2.5 mcg/kg/min (06/21/14 0541)  . 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   He was admitted  10/30 with respiratory failure, worsening heart failure, creatinine of 4.6, hyperkalemia (6.8) and wide complex bradycardia. CRRT initiated 10/30 for volume control, renal failure. Has BC+ 11/1 for Bacillus on Vanc (ID following). CRRT stopped 06/20/14 and 48 hr Line holiday started.   Assessment/Plan:  AKI on CKD4  Remains oliguric. Unclear how much recovery he will get. CVP better and volume status improving after CRRT. Pt is not candidate for heart transplant or pump therapy. Would not be candidate for outpt HD given need for outpt dobutamine.  Currently trying to wean dobutamine so we could get him into an OP unit  Catheter holiday started 11/11; IR to place Cath tomorrow afternooon after 48hrs holiday- give or take.  Suspect he will need HD either tomorrow PM or Saturday, do not want to get him into too much problem volume wise   Cr inc 3.76 11/12 from 2.21 11/11; UOP minimal (15cc in last 24); but he did urinate for the first time which was not measured  Restrict fluids  to 1200cc  Question whether the Dobutamine could be stopped and he be able to tolerate IHD?- Dr Gala Romney doubts this would be possible but could attempt vs try PD- the issue with PD is that his home environment may not be ideal and as is the case with PD a back up plan is needed if gets peritonitis/mechanical issues- would favor maybe trying to wean dobutamine ?maybe could be possible now since volume is so much better ? slowly over the next week or so and we could establish on IHD- could run 4 times a week so as to limit amount of fluid needing to be removed at any given time.  Would also like to get a permanent access- will ask [facility] to see tomorrow but I suspect this bacteremia might be an issue- maybe next week could have access placed ??   CM with acute on chronic systolic heart failure/NICM  EF < 15% - on dobutamine. Per cardiology.   Hypophosphatemia- Resolved 3.6 on 11/12  Hyponatremia: Na 129 (11/12). Continue to monitor- worsened since off CRRT  H/O VT  PAF: INR 1.43 (11/4) IV heparin per Pharm  Anemia stable. Fe deficient but withholding IV iron in setting of bacillus bacteremia- hgb now in 9's , will add aranesp  PTH: Elevated at 168: Calcitriol (0.25 mcg) PO qd  Bacillus bacteremia (11/1). ID following. On vanco (11/1>>); zosyn stopped (11/1>>11/3)  Needs Catheter Holiday - Will continue with CRRT for fluid removal as he is currently stable and plan to remove line when we attempt to transition to IHD  Cocaine abuse - none for 4 months     LOS: 13 days   Wenda Low, MD PGY-2, [facility] 06/21/2014, 8:26 AM   Patient seen and examined, agree with above note with above modifications. Tolerating off CRRT for 24 hours, took in 1800 and sodium went down.  Will try not to let him get into too much trouble.  Will ask IR to place tunneled PC tomorrow, will likley need HD tomorrow night.  Will also ask [facility] to see tomorrow, maybe he could get perm access next week.   Also work on weaning dobutamine so can establish at OP center.  Pt still has questions about home HD/PD which I think we could revisit as an OP but probably easiest course of action at this point would be to get him established for OP HD  Annie Sable, MD 06/21/2014

## 2014-06-21 NOTE — Progress Notes (Signed)
[facility]      Subjective: Lines are out   Antibiotics:  Anti-infectives    Start     Dose/Rate Route Frequency Ordered Stop   06/18/14 1000  vancomycin (VANCOCIN) 1,250 mg in sodium chloride 0.9 % 150 mL IVPB     1,250 mg100 mL/hr over 90 Minutes Intravenous Every 24 hours 06/17/14 1134     06/14/14 0800  vancomycin (VANCOCIN) 1,500 mg in sodium chloride 0.9 % 250 mL IVPB  Status:  Discontinued     1,500 mg125 mL/hr over 120 Minutes Intravenous Every 24 hours 06/13/14 1528 06/17/14 1134   06/10/14 1000  piperacillin-tazobactam (ZOSYN) IVPB 3.375 g  Status:  Discontinued     3.375 g100 mL/hr over 30 Minutes Intravenous Every 6 hours 06/10/14 0918 06/12/14 1318   06/10/14 1000  vancomycin (VANCOCIN) 1,250 mg in sodium chloride 0.9 % 250 mL IVPB  Status:  Discontinued     1,250 mg166.7 mL/hr over 90 Minutes Intravenous Every 24 hours 06/10/14 0918 06/13/14 1528      Medications: Scheduled Meds: . amiodarone  200 mg Oral Daily  . calcitRIOL  0.25 mcg Oral Daily  . feeding supplement (NEPRO CARB STEADY)  237 mL Oral BID BM  . insulin aspart  0-9 Units Subcutaneous TID WC  . pantoprazole  40 mg Oral Daily  . vancomycin  1,250 mg Intravenous Q24H   Continuous Infusions: . sodium chloride 10 mL/hr at 06/20/14 0020  . DOBUTamine 2.5 mcg/kg/min (06/21/14 0541)  . heparin 1,400 Units/hr (06/21/14 0605)   PRN Meds:.sodium chloride, acetaminophen    Objective: Weight change: -5 lb 11.7 oz (-2.6 kg)  Intake/Output Summary (Last 24 hours) at 06/21/14 1039 Last data filed at 06/21/14 1000  Gross per 24 hour  Intake 1627.92 ml  Output    184 ml  Net 1443.92 ml   Blood pressure 72/55, pulse 97, temperature 97.8 F (36.6 C), temperature source Oral, resp. rate 24, height 6' (1.829 m), weight 210 lb 12.2 oz (95.6 kg), SpO2 100 %. Temp:  [97.7 F (36.5 C)-98.5 F (36.9 C)] 97.8 F (36.6 C) (11/12 0751) Pulse Rate:  [87-114] 97 (11/12 1000) Resp:   [11-29] 24 (11/12 1000) BP: (67-102)/(50-71) 72/55 mmHg (11/12 1000) SpO2:  [96 %-100 %] 100 % (11/12 1000) Weight:  [210 lb 12.2 oz (95.6 kg)] 210 lb 12.2 oz (95.6 kg) (11/12 0249)  Physical Exam: General: Alert and awake, oriented x3, under a cooling blanket HEENT: anicteric sclera,  EOMI, oropharynx clear and without exudate CVS tachycardic raterate, 2/6 systolic murmur at PMI Chest: clear to auscultation bilaterally anteriorly Abdomen: soft nontender,distended, normal bowel sounds, Extremities: 2+ edema noted bilaterally Neuro: nonfocal, strength and sensation intact   CBC:  CBC Latest Ref Rng 06/21/2014 06/20/2014 06/19/2014  WBC 4.0 - 10.5 K/uL 6.8 6.8 7.0  Hemoglobin 13.0 - 17.0 g/dL 1.6(X) 10.3(L) 9.6(L)  Hematocrit 39.0 - 52.0 % 28.6(L) 32.4(L) 30.9(L)  Platelets 150 - 400 K/uL 214 184 166      BMET  Recent Labs  06/20/14 0235 06/21/14 0236  NA 133* 129*  K 4.4 4.2  CL 96 93*  CO2 24 22  GLUCOSE 213* 133*  BUN 12 23  CREATININE 2.21* 3.76*  CALCIUM 8.6 8.7     Liver Panel   Recent Labs  06/20/14 0235 06/21/14 0236  ALBUMIN 2.7* 2.6*       Sedimentation Rate No results for input(s): ESRSEDRATE in the last 72 hours. C-Reactive Protein No results  for input(s): CRP in the last 72 hours.  Micro Results: Recent Results (from the past 720 hour(s))  MRSA PCR Screening     Status: None   Collection Time: 06/08/14  1:16 PM  Result Value Ref Range Status   MRSA by PCR NEGATIVE NEGATIVE Final    Comment:        The GeneXpert MRSA Assay (FDA approved for NASAL specimens only), is one component of a comprehensive MRSA colonization surveillance program. It is not intended to diagnose MRSA infection nor to guide or monitor treatment for MRSA infections.  Urine culture     Status: None   Collection Time: 06/09/14 10:32 AM  Result Value Ref Range Status   Specimen Description URINE, CATHETERIZED  Final   Special Requests Immunocompromised   Final   Culture  Setup Time   Final    06/09/2014 20:50 Performed at [facility]    Colony Count NO GROWTH Performed at [facility]   Final   Culture NO GROWTH Performed at [facility]   Final   Report Status 06/10/2014 FINAL  Final  Culture, blood (routine x 2)     Status: None   Collection Time: 06/10/14  3:39 AM  Result Value Ref Range Status   Specimen Description BLOOD LEFT HAND  Final   Special Requests BOTTLES DRAWN AEROBIC ONLY 4CC  Final   Culture  Setup Time   Final    06/10/2014 17:58 Performed at [facility]    Culture   Final    BACILLUS SPECIES Note: Standardized susceptibility testing for this organism is not available. Note: Gram Stain Report Called to,Read Back By and Verified With: BRYCE SHEPARD @ 3:58AM 11.2.15 BY DESIS Performed at [facility]    Report Status 06/12/2014 FINAL  Final  Culture, blood (routine x 2)     Status: None   Collection Time: 06/10/14  3:52 AM  Result Value Ref Range Status   Specimen Description BLOOD LEFT HAND  Final   Special Requests BOTTLES DRAWN AEROBIC ONLY 8CC  Final   Culture  Setup Time   Final    06/10/2014 17:58 Performed at [facility]    Culture   Final    BACILLUS SPECIES Note: Standardized susceptibility testing for this organism is not available. Note: Gram Stain Report Called to,Read Back By and Verified With: BRYCE SHEPARD @ 3:58AM 11.2.15 BY DESIS Culture results may be compromised due to an excessive volume of blood received in culture bottles. Performed at [facility]    Report Status 06/12/2014 FINAL  Final  Culture, blood (routine x 2)     Status: None   Collection Time: 06/12/14  1:54 PM  Result Value Ref Range Status   Specimen Description BLOOD LEFT HAND  Final   Special Requests   Final    BOTTLES DRAWN AEROBIC AND ANAEROBIC BLUE 10CC RED 5CC   Culture  Setup Time   Final    06/12/2014 20:35 Performed at [facility]     Culture   Final    NO GROWTH 5 DAYS Performed at [facility]    Report Status 06/18/2014 FINAL  Final  Culture, blood (routine x 2)     Status: None   Collection Time: 06/12/14  2:05 PM  Result Value Ref Range Status   Specimen Description BLOOD LEFT ARM  Final   Special Requests BOTTLES DRAWN AEROBIC AND ANAEROBIC 10CC  Final   Culture  Setup Time  Final    06/12/2014 20:35 Performed at [facility]    Culture   Final    NO GROWTH 5 DAYS Performed at [facility]    Report Status 06/18/2014 FINAL  Final    Studies/Results: No results found.    Assessment/Plan:  Active Problems:   Acute respiratory failure   Cardiogenic shock   Acute on chronic systolic congestive heart failure   Acute on chronic renal failure   Hyperkalemia   Respiratory failure   Bacteremia associated with intravascular line   Blood poisoning   Acute respiratory failure with hypoxia   AKI (acute kidney injury)   Screen for STD (sexually transmitted disease)   Pulmonary edema    Damir Feezor is a 63 y.o. male with PICC line at home with dobutamine infusion now with admission for CHF, renal failure, hyperkalemia, bradycardia, fevers and found to be bacteremic with Bacillus (likely cereus)  #1 Bacillus Cereus bacteremia: likely due to his improper non sterile manipulation of his line when he was changing infusion site. 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 Lab does not speciate beyond saying this non anthrax Bacillus and an aerobe, no sensis available  -- narrowed to vancomycin, given resistance to penicillin found with this organism' -- LINES ARE OUT as of yesterday --check BLOOD CULTURES from 2 sites sent from today --OK TO PLACE CENTRAL LINE FOR HIS DOBUTAMINE AND FOR HIS VANCOMYCIN AFTER HIS 48 HOUR "line holiday" --would treat with IV vancomycin for 2 weeks with  target trough of 15-20, and today being day # 2 --please fax weekly bmp, cbc c diff and vancomycin levels to Dr. Daiva Eves @ [phone]  I will sign off for now but followup his blood cultures  Please call with further questions.       LOS: 13 days   Acey Lav 06/21/2014, 10:39 AM

## 2014-06-21 NOTE — Progress Notes (Signed)
Heparin level this am undetectable after very stable at current rate since 11/5.  Several variables may affect level (stopped CRRT, RN tonight changed lines).  Will redraw to verify prior to making any changes.  Vernard Gambles, PharmD, BCPS 06/21/2014 3:57 AM

## 2014-06-22 ENCOUNTER — Inpatient Hospital Stay (HOSPITAL_COMMUNITY): Payer: Medicare HMO

## 2014-06-22 ENCOUNTER — Encounter (HOSPITAL_COMMUNITY): Payer: Self-pay | Admitting: Radiology

## 2014-06-22 DIAGNOSIS — E119 Type 2 diabetes mellitus without complications: Secondary | ICD-10-CM | POA: Insufficient documentation

## 2014-06-22 DIAGNOSIS — M109 Gout, unspecified: Secondary | ICD-10-CM | POA: Insufficient documentation

## 2014-06-22 DIAGNOSIS — N184 Chronic kidney disease, stage 4 (severe): Secondary | ICD-10-CM

## 2014-06-22 LAB — RENAL FUNCTION PANEL
Albumin: 2.6 g/dL — ABNORMAL LOW (ref 3.5–5.2)
Anion gap: 18 — ABNORMAL HIGH (ref 5–15)
BUN: 35 mg/dL — ABNORMAL HIGH (ref 6–23)
CO2: 17 meq/L — AB (ref 19–32)
Calcium: 9 mg/dL (ref 8.4–10.5)
Chloride: 90 mEq/L — ABNORMAL LOW (ref 96–112)
Creatinine, Ser: 5.63 mg/dL — ABNORMAL HIGH (ref 0.50–1.35)
GFR, EST AFRICAN AMERICAN: 11 mL/min — AB (ref >90)
GFR, EST NON AFRICAN AMERICAN: 10 mL/min — AB (ref >90)
Glucose, Bld: 147 mg/dL — ABNORMAL HIGH (ref 70–99)
POTASSIUM: 5.3 meq/L (ref 3.7–5.3)
Phosphorus: 4.5 mg/dL (ref 2.3–4.6)
SODIUM: 125 meq/L — AB (ref 137–147)

## 2014-06-22 LAB — CBC
HCT: 29.2 % — ABNORMAL LOW (ref 39.0–52.0)
Hemoglobin: 9.4 g/dL — ABNORMAL LOW (ref 13.0–17.0)
MCH: 24.5 pg — ABNORMAL LOW (ref 26.0–34.0)
MCHC: 32.2 g/dL (ref 30.0–36.0)
MCV: 76 fL — ABNORMAL LOW (ref 78.0–100.0)
PLATELETS: 222 10*3/uL (ref 150–400)
RBC: 3.84 MIL/uL — AB (ref 4.22–5.81)
RDW: 21 % — AB (ref 11.5–15.5)
WBC: 7.1 10*3/uL (ref 4.0–10.5)

## 2014-06-22 LAB — GLUCOSE, CAPILLARY
GLUCOSE-CAPILLARY: 151 mg/dL — AB (ref 70–99)
Glucose-Capillary: 146 mg/dL — ABNORMAL HIGH (ref 70–99)
Glucose-Capillary: 141 mg/dL — ABNORMAL HIGH (ref 70–99)

## 2014-06-22 LAB — HEPARIN LEVEL (UNFRACTIONATED)
Heparin Unfractionated: 0.17 IU/mL — ABNORMAL LOW (ref 0.30–0.70)
Heparin Unfractionated: 0.41 IU/mL (ref 0.30–0.70)

## 2014-06-22 LAB — PROTIME-INR
INR: 1.4 (ref 0.00–1.49)
PROTHROMBIN TIME: 17.3 s — AB (ref 11.6–15.2)

## 2014-06-22 MED ORDER — OXYCODONE HCL 5 MG PO TABS
5.0000 mg | ORAL_TABLET | Freq: Four times a day (QID) | ORAL | Status: DC | PRN
  Administered 2014-06-22 – 2014-06-28 (×7): 5 mg via ORAL
  Filled 2014-06-22 (×7): qty 1

## 2014-06-22 MED ORDER — FENTANYL CITRATE 0.05 MG/ML IJ SOLN
INTRAMUSCULAR | Status: AC | PRN
  Administered 2014-06-22 (×2): 50 ug via INTRAVENOUS

## 2014-06-22 MED ORDER — SODIUM CHLORIDE 0.9 % IV SOLN: 100.0000 mL | INTRAVENOUS | Status: DC | PRN

## 2014-06-22 MED ORDER — ALBUMIN HUMAN 25 % IV SOLN
INTRAVENOUS | Status: AC
  Filled 2014-06-22: qty 100

## 2014-06-22 MED ORDER — GELATIN ABSORBABLE 12-7 MM EX MISC
CUTANEOUS | Status: AC
  Filled 2014-06-22: qty 1

## 2014-06-22 MED ORDER — HEPARIN SODIUM (PORCINE) 1000 UNIT/ML DIALYSIS: 20.0000 [IU]/kg | INTRAMUSCULAR | Status: DC | PRN

## 2014-06-22 MED ORDER — MIDAZOLAM HCL 2 MG/2ML IJ SOLN
INTRAMUSCULAR | Status: AC
  Filled 2014-06-22: qty 4

## 2014-06-22 MED ORDER — NEPRO/CARBSTEADY PO LIQD
237.0000 mL | ORAL | Status: DC | PRN
  Filled 2014-06-22: qty 237

## 2014-06-22 MED ORDER — FENTANYL CITRATE 0.05 MG/ML IJ SOLN
INTRAMUSCULAR | Status: AC
  Filled 2014-06-22: qty 4

## 2014-06-22 MED ORDER — VANCOMYCIN HCL 10 G IV SOLR: 1250.0000 mg | Freq: Once | INTRAVENOUS | Status: DC

## 2014-06-22 MED ORDER — LIDOCAINE-PRILOCAINE 2.5-2.5 % EX CREA
1.0000 "application " | TOPICAL_CREAM | CUTANEOUS | Status: DC | PRN
  Filled 2014-06-22: qty 5

## 2014-06-22 MED ORDER — LIDOCAINE HCL 1 % IJ SOLN
INTRAMUSCULAR | Status: AC
  Filled 2014-06-22: qty 20

## 2014-06-22 MED ORDER — LIDOCAINE HCL (PF) 1 % IJ SOLN: 5.0000 mL | INTRAMUSCULAR | Status: DC | PRN

## 2014-06-22 MED ORDER — ALTEPLASE 2 MG IJ SOLR
2.0000 mg | Freq: Once | INTRAMUSCULAR | Status: DC | PRN
  Filled 2014-06-22: qty 2

## 2014-06-22 MED ORDER — ALBUMIN HUMAN 25 % IV SOLN
25.0000 g | Freq: Once | INTRAVENOUS | Status: AC
  Administered 2014-06-22: 25 g via INTRAVENOUS

## 2014-06-22 MED ORDER — HEPARIN SODIUM (PORCINE) 1000 UNIT/ML DIALYSIS: 1000.0000 [IU] | INTRAMUSCULAR | Status: DC | PRN

## 2014-06-22 MED ORDER — PENTAFLUOROPROP-TETRAFLUOROETH EX AERO: 1.0000 "application " | INHALATION_SPRAY | CUTANEOUS | Status: DC | PRN

## 2014-06-22 MED ORDER — MIDAZOLAM HCL 2 MG/2ML IJ SOLN
INTRAMUSCULAR | Status: AC | PRN
  Administered 2014-06-22 (×2): 1 mg via INTRAVENOUS

## 2014-06-22 MED ORDER — HEPARIN SODIUM (PORCINE) 1000 UNIT/ML IJ SOLN
INTRAMUSCULAR | Status: AC
  Filled 2014-06-22: qty 1

## 2014-06-22 MED ORDER — DARBEPOETIN ALFA 60 MCG/0.3ML IJ SOSY
PREFILLED_SYRINGE | INTRAMUSCULAR | Status: AC
  Filled 2014-06-22: qty 0.3

## 2014-06-22 NOTE — Progress Notes (Signed)
ANTICOAGULATION CONSULT NOTE - Follow Up Consult  Pharmacy Consult for Heparin  Indication: atrial fibrillation and apical thrombus  No Known Allergies  Patient Measurements: Height: 6' (182.9 cm) Weight: 210 lb 12.2 oz (95.6 kg) IBW/kg (Calculated) : 77.6  Vital Signs: Temp: 98.4 F (36.9 C) (11/12 2329) Temp Source: Oral (11/12 2329) BP: 94/66 mmHg (11/13 0100) Pulse Rate: 89 (11/13 0100)  Labs:  Recent Labs  06/19/14 0153  06/20/14 0235 06/21/14 0236 06/21/14 0514 06/21/14 1455 06/22/14 0010  HGB 9.6*  --  10.3* 9.1*  --   --  9.4*  HCT 30.9*  --  32.4* 28.6*  --   --  29.2*  PLT 166  --  184 214  --   --  222  APTT 137*  --  152*  --   --   --   --   LABPROT 17.4*  --  17.4* 16.6*  --   --  17.3*  INR 1.41  --  1.41 1.33  --   --  1.40  HEPARINUNFRC 0.53  --  0.56 <0.10* <0.10* 0.17* 0.41  CREATININE 2.05*  < > 2.21* 3.76*  --   --  5.63*  < > = values in this interval not displayed.  Estimated Creatinine Clearance: 15.7 mL/min (by C-G formula based on Cr of 5.63).    Assessment: Therapeutic heparin level after rate increase.   Goal of Therapy:  Heparin level 0.3-0.7 units/ml Monitor platelets by anticoagulation protocol: Yes   Plan:  -Continue heparin at 1750 units/hr -0800 HL to confirm -Daily CBC/HL -Monitor for bleeding  Abran [facility] 06/22/2014,1:38 AM

## 2014-06-22 NOTE — Sedation Documentation (Signed)
Pt awake saying he does not feel sedation meds affecting him yet.

## 2014-06-22 NOTE — Sedation Documentation (Signed)
MD at bedside. 

## 2014-06-22 NOTE — Progress Notes (Signed)
[facility] Rounding Note  Subjective: Complains of feeling fluid around his stomach and some SOB today. CRRT stopped 11/11. Cr trending up and volume status.  He is feeling the fluid and is now off dobutamine  Objective: Vital signs in last 24 hours: Temp:  [97.6 F (36.4 C)-98.4 F (36.9 C)] 97.6 F (36.4 C) (11/13 0822) Pulse Rate:  [82-188] 82 (11/13 0700) Resp:  [10-32] 19 (11/13 0700) BP: (72-94)/(46-70) 83/62 mmHg (11/13 0700) SpO2:  [97 %-100 %] 99 % (11/13 0700) Weight change:   Intake/Output from previous day: 11/12 0701 - 11/13 0700 In: 912.4 [I.V.:662.4; IV Piggyback:250] Out: -   Net - 25 L since admit  Weight Trending 10/30  112.4 kg 10/31  114.1 kg 11/1    113.5 kg 11/2    111.2 kg     11/3     108.5 kg   11/4     106.8 kg   11/5     106.4 kg 11/6    102.4 kg 11/7    102.9 kg 11/8    100.2 kg 11/9     98.9 kg 11/10   96.6 kg 11/11    98.2 kg 11/12    95.6 kg    Physical Examination: BP 83/62 mmHg  Pulse 82  Temp(Src) 97.6 F (36.4 C) (Oral)  Resp 19  Ht 6' (1.829 m)  Wt 210 lb 12.2 oz (95.6 kg)  BMI 28.58 kg/m2  SpO2 99%  Awake; A&Ox3 RRR; S1S2 + s3 w/ 2/6 systolic murmur Lungs CTAB, no crackles heard Trace edema at calves   Recent Labs  06/21/14 0236 06/22/14 0010  WBC 6.8 7.1  HGB 9.1* 9.4*  HCT 28.6* 29.2*  PLT 214 222    Recent Labs  06/21/14 0236 06/22/14 0010  NA 129* 125*  K 4.2 5.3  CL 93* 90*  CO2 22 17*  GLUCOSE 133* 147*  BUN 23 35*  CREATININE 3.76* 5.63*  CALCIUM 8.7 9.0    Recent Labs  06/20/14 0235 06/21/14 0236 06/22/14 0010  PHOS 2.9 3.6 4.5  MG 2.5  --   --      Results for [name], [name] (MRN [id number]) as of 06/11/2014 07:18  Ref. Range 06/09/2014 07:30  Iron Latest Range: 42-135 ug/dL 21 (L)  UIBC Latest Range: 125-400 ug/dL 322  TIBC Latest Range: 215-435 ug/dL 025  Saturation Ratios Latest Range: 20-55 % 6 (L)    Studies/Results: No results found. Scheduled  Medications . amiodarone  200 mg Oral Daily  . calcitRIOL  0.25 mcg Oral Daily  . darbepoetin (ARANESP) injection - DIALYSIS  60 mcg Intravenous Q Fri-HD  . feeding supplement (NEPRO CARB STEADY)  237 mL Oral BID BM  . insulin aspart  0-9 Units Subcutaneous TID WC  . pantoprazole  40 mg Oral Daily  . vancomycin  1,250 mg Intravenous Q24H   Infusion/other Medications . sodium chloride 10 mL/hr at 06/20/14 0020  . DOBUTamine Stopped (06/22/14 0510)  . 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   He was admitted 10/30 with respiratory failure, worsening heart failure, creatinine of 4.6, hyperkalemia (6.8) and wide complex bradycardia. CRRT initiated 10/30 for volume control, renal failure. Has BC+ 11/1 for Bacillus on Vanc (ID following). CRRT  stopped 06/20/14 and 48 hr Line holiday started.   Assessment/Plan:  AKI on CKD4  Remains anuric. Doubt he will recover any renal fcn. Pt is not candidate for heart transplant or pump therapy. Dobutamine off.   Catheter holiday started 11/11; IR to place Cath this afternooon after 48hrs holiday as he will need HD today and Saturday as well- do not want to get him into too much problem volume wise   Cr inc 5.63 on 11/13 from 2.21 11/11; UOP minimal (15cc in last 24)  Restrict fluids to 1200cc  Dobutamine stopped - we will see if he can tolerate IHD?-  Could attempt vs try PD- the issue with PD is that his home environment may not be ideal and as is the case with PD a back up plan is needed if gets peritonitis/mechanical issues- could run IHD 4 times a week so as to limit amount of fluid needing to be removed at any given time.    Would also like to get a permanent access- will ask [facility] to see but I suspect this  bacteremia might be an issue- maybe next week could have access placed. - Will call today  CM with acute on chronic systolic heart failure/NICM  EF < 15% - Off dobutamine on 11/13 Per cardiology.   Hypophosphatemia- Resolved 4.5 on 11/13  Hyponatremia: Na 125 (11/13). Continue to monitor- worsening since off CRRT.   Starting HD today as above  H/O VT  PAF: INR 1.43 (11/4) IV heparin per Pharm  Anemia stable. Fe deficient but withholding IV iron in setting of bacillus bacteremia- hgb now in 9's , have added aranesp  PTH: Elevated at 168: Calcitriol (0.25 mcg) PO qd  Bacillus bacteremia (11/1). ID following. On vanco (11/1>>); zosyn stopped (11/1>>11/3)  Catheter Holiday ends today  F/u BCx - will need TEE if positive  IV vancomycin for 2 weeks with target trough of 15-20, and today being day #3  Please fax weekly bmp, cbc c diff and vancomycin levels to Dr. Daiva EvesVan Dam @ [phone]  Cocaine abuse - none for 4 months     LOS: 14 days   Wenda LowJames Joyner, MD PGY-2, [facility] 06/22/2014, 8:51 AM   Patient seen and examined, agree with above note with above modifications.  He can feel the fluid now being off CRRT for 48 hours.  [facility] today and tomorrow, then Monday.  Will continue to evaluate as to whether he would need 4 time a week HD.  Dobutamine is off which makes HD at an OP center possible.  We can always revisit PD as OP or home HD.  Regarding access, he has been on treatment for this bacteremia since 11/1 , blood cultures checked yest- NGTD. Due for 12 more days of vanc.  We would like to get a perm access prior to him leaving the hospital if possible, [facility] has been called  Annie SableKellie Shon Indelicato, MD 06/22/2014

## 2014-06-22 NOTE — Procedures (Signed)
Patient was seen on dialysis and the procedure was supervised.  BFR 300  Via PC BP is  91/62.   Patient appears to be tolerating treatment well- so far so good   [name] A 06/22/2014

## 2014-06-22 NOTE — Sedation Documentation (Signed)
Patient is resting comfortably. 

## 2014-06-22 NOTE — Progress Notes (Signed)
Patient ID: Trevor MaceKenneth Rutt, male   DOB: Dec 06, 1950, 63 y.o.   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 Maintained on dobutamine 215mcg/kg/min.   Past medical h/o is notable for CKD with baseline Cr 3.0, cocaine abuse, DM2, AF on amio, VT. He has not been candidate for advanced therapies due to cocaine abuse (quit 4 months ago) and renal failure. Started on dobutamine. Moved to [place] to try to stay off cocaine.   On Oct 14 seen at [facility]. CR was 3.3 (on 04/16/2014 Cr was 1.6 at [facility] on discharge) and potassium was low. Kcl was increased. Over past week increased dyspnea and edema. NYHA IIIB symptoms at baseline.   10/30 Admitted with rep failure with wide complex bradycardia. K 6.8 and Cr 4.6. Intubated and CVVHD started.  Extubated 11/1.  Continues on CVVHD.  Bcx + for bacillus. ID requesting line holiday.  CVVHD stopped on 11/11 after 35 pound removal. All central catheters out for line holiday. Not making urine. Creatinine rising quickly. Dobutamine off this am. SBP 80s. Feels ok.   Objective:   Weight Range:  Vital Signs:   Temp:  [97.7 F (36.5 C)-98.4 F (36.9 C)] 98 F (36.7 C) (11/13 0334) Pulse Rate:  [82-188] 82 (11/13 0700) Resp:  [10-32] 19 (11/13 0700) BP: (72-98)/(46-70) 83/62 mmHg (11/13 0700) SpO2:  [97 %-100 %] 99 % (11/13 0700) Last BM Date: 06/21/14  Weight change: Filed Weights   06/19/14 0400 06/20/14 0415 06/21/14 0249  Weight: 96.9 kg (213 lb 10 oz) 98.2 kg (216 lb 7.9 oz) 95.6 kg (210 lb 12.2 oz)    Intake/Output:   Intake/Output Summary (Last 24 hours) at 06/22/14 [id number] Last data filed at 06/22/14 0500  Gross per 24 hour  Intake 884.94 ml  Output      0 ml  Net 884.94 ml     Physical Exam:  General: NAD  Sitting in chair HEENT: normal  Neck: supple. JVP jaw Carotids 2+ bilat; no bruits. No lymphadenopathy or thryomegaly  appreciated.  Cor: PMI laterally displaced. Regular rate & rhythm. 2/6 MR +s3  Lungs: clear Abdomen: soft, nontender, nondistended. No hepatosplenomegaly. No bruits or masses. Good bowel sounds.  Extremities: no cyanosis, clubbing, rash, 1+ edema Neuro: alert & orientedx3, cranial nerves grossly intact. moves all 4 extremities w/o difficulty. Affect pleasant   Telemetry: SR 90s  Labs: Basic Metabolic Panel:  Recent Labs Lab 06/16/14 0249  06/17/14 0230  06/18/14 0238  06/19/14 0153 06/19/14 1600 06/20/14 0235 06/21/14 0236 06/22/14 0010  NA  --   < > 134*  < > 134*  < > 128* 135* 133* 129* 125*  K  --   < > 4.4  < > 4.4  < > 4.2 4.0 4.4 4.2 5.3  CL  --   < > 97  < > 96  < > 94* 97 96 93* 90*  CO2  --   < > 23  < > 24  < > 22 24 24 22  17*  GLUCOSE  --   < > 118*  < > 109*  < > 139* 180* 213* 133* 147*  BUN  --   < > 10  < > 11  < > 12 8 12 23  35*  CREATININE  --   < > 1.99*  < > 1.87*  < > 2.05* 1.44* 2.21* 3.76* 5.63*  CALCIUM  --   < > 8.4  < > 8.5  < > 8.5 8.2* 8.6 8.7 9.0  MG 2.6*  --  2.6*  --  2.5  --  2.4  --  2.5  --   --   PHOS  --   < > 2.6  < > 2.8  < > 2.8 1.8* 2.9 3.6 4.5  < > = values in this interval not displayed.  Liver Function Tests:  Recent Labs Lab 06/19/14 0153 06/19/14 1600 06/20/14 0235 06/21/14 0236 06/22/14 0010  ALBUMIN 2.6* 2.7* 2.7* 2.6* 2.6*   No results for input(s): LIPASE, AMYLASE in the last 168 hours. No results for input(s): AMMONIA in the last 168 hours.  CBC:  Recent Labs Lab 06/18/14 0238 06/19/14 0153 06/20/14 0235 06/21/14 0236 06/22/14 0010  WBC 6.4 7.0 6.8 6.8 7.1  HGB 9.9* 9.6* 10.3* 9.1* 9.4*  HCT 31.5* 30.9* 32.4* 28.6* 29.2*  MCV 77.4* 77.1* 77.0* 76.1* 76.0*  PLT 169 166 184 214 222    Cardiac Enzymes: No results for input(s): CKTOTAL, CKMB, CKMBINDEX, TROPONINI in the last 168 hours.  BNP: BNP (last 3 results)  Recent Labs  06/08/14 1040 06/12/14 0450  PROBNP 11335.0* 6716.0*     Other  results:    Imaging: No results found.   Medications:     Scheduled Medications: . amiodarone  200 mg Oral Daily  . calcitRIOL  0.25 mcg Oral Daily  . darbepoetin (ARANESP) injection - DIALYSIS  60 mcg Intravenous Q Fri-HD  . feeding supplement (NEPRO CARB STEADY)  237 mL Oral BID BM  . insulin aspart  0-9 Units Subcutaneous TID WC  . pantoprazole  40 mg Oral Daily  . vancomycin  1,250 mg Intravenous Q24H    Infusions: . sodium chloride 10 mL/hr at 06/20/14 0020  . DOBUTamine Stopped (06/22/14 0510)  . heparin 1,750 Units/hr (06/21/14 2226)    PRN Medications: sodium chloride, acetaminophen   Assessment:   1. Acute respiratory failure  2. A/c systolic HF due to NICM EF < 15%  3. Acute on chronic renal failure stage IV  4. Hyperkalemia  5. Cardiogenic shock  6. DM2  7. Cocaine abuse in remission 3-4 months  8. H/o gout  9. Supratherapeutic INR  10. LV thrombus  11. PAF on amio  12. H/o VT 13. Fever 11/1   Plan/Discussion:    Now off CVVHD. Creatinine up significantly. Volume up Essentially anuric. Will need to resume HD. Perm Cath to be placed later today.   He is off dobutamine now. Will see how he tolerates iHD. I am worried he may not be able to tolerate. If fails, will need to consider home iHD on dobutamine (versus PD)  If bcx are repeat + will need TEE.   Leinaala Catanese,MD 7:12 AM

## 2014-06-22 NOTE — Progress Notes (Signed)
UR Completed.  Leighann Amadon Jane [phone] 06/22/2014  

## 2014-06-22 NOTE — Consult Note (Addendum)
[name] NOTE   MRN : [id number]  Reason for Consult: ESRD stage IV Referring Physician: Cecille Aver, MD  History of Present Illness: 63 yr male with hx DM, HTN,^ lipids,HTN, hx Afib, MR, apical thrombus, EF 10-15% followed at [facility]. Hx CKD with most recent Cr on 10/14 of 3.4 . He was admitted secondary to severe dyspnea, SOB which has been progressive.He is improving.  IR is putting in a temp catheter today for dialysis and we have been asked to provide permanent access for future dialysis needs.  He's home meds include taking Lipitor for hyperlipidemia and insulin for DM.  He is currently on dobutamine 75mcg/kg/min.   Current Facility-Administered Medications  Medication Dose Route Frequency Provider Last Rate Last Dose  . 0.9 %  sodium chloride infusion   Intravenous Continuous PRN Nelda Bucks, MD 10 mL/hr at 06/20/14 0020    . acetaminophen (TYLENOL) tablet 650 mg  650 mg Oral Q6H PRN Coralyn Helling, MD   650 mg at 06/21/14 2225  . amiodarone (PACERONE) tablet 200 mg  200 mg Oral Daily Dolores Patty, MD   200 mg at 06/22/14 1032  . calcitRIOL (ROCALTROL) capsule 0.25 mcg  0.25 mcg Oral Daily Jacquelin Hawking, MD   0.25 mcg at 06/22/14 1031  . Darbepoetin Alfa (ARANESP) injection 60 mcg  60 mcg Intravenous Q Fri-HD Cecille Aver, MD      . DOBUTamine (DOBUTREX) infusion 4000 mcg/mL  2.5 mcg/kg/min Intravenous Titrated Dolores Patty, MD   Stopped at 06/22/14 0510  . feeding supplement (NEPRO CARB STEADY) liquid 237 mL  237 mL Oral BID BM Hettie Holstein, RD   Stopped at 06/22/14 1000  . heparin ADULT infusion 100 units/mL (25000 units/250 mL)  1,750 Units/hr Intravenous Continuous Sampson Si, RPH   Stopped at 06/22/14 0830  . insulin aspart (novoLOG) injection 0-9 Units  0-9 Units Subcutaneous TID WC Dolores Patty, MD   1 Units at 06/22/14 [phone]  . pantoprazole (PROTONIX) EC tablet 40 mg  40 mg Oral  Daily Gala Lewandowsky Westbrook, RPH   40 mg at 06/22/14 1032  . vancomycin (VANCOCIN) 1,250 mg in sodium chloride 0.9 % 250 mL IVPB  1,250 mg Intravenous Q24H Dolores Patty, MD   1,250 mg at 06/21/14 1808    Pt meds include: Statin :Yes Betablocker: No ASA: No Other anticoagulants/antiplatelets: prior to this admission he was on coumadin.  Past Medical History  Diagnosis Date  . Diabetes mellitus without complication   . Chronic systolic heart failure     a) ECHO [facility] 02/2014): EF <15%, multiple apical thrombi, RV mod enlarged, mod MR b. RHC (02/21/14) at [facility]: RA 16, RV 64/9 (15), PA 64/32 (42), PCWP: 24, CI: 1.4  . HTN (hypertension)   . HLD (hyperlipidemia)   . CKD (chronic kidney disease), stage III   . Apical mural thrombus   . Mitral regurgitation   . Drug use     cocaine  . Atrial fibrillation, chronic     History reviewed. No pertinent past surgical history.  Social History History  Substance Use Topics  . Smoking status: Never Smoker   . Smokeless tobacco: Never Used  . Alcohol Use: Not on file    Family History History reviewed. No pertinent family history.  He has no family members on dialysis  No Known Allergies   REVIEW OF SYSTEMS  General: [ ]  Weight loss, [ ]  Fever, [ ]  chills  Neurologic: [ ]  Dizziness, [ ]  Blackouts, [ ]  Seizure [ ]  Stroke, [ ]  "Mini stroke", [ ]  Slurred speech, [ ]  Temporary blindness; [ ]  weakness in arms or legs, [ ]  Hoarseness [ ]  Dysphagia Cardiac: [ ]  Chest pain/pressure, [ ]  Shortness of breath at rest [x ] Shortness of breath with exertion, [ ]  Atrial fibrillation or irregular heartbeat  Vascular: [ ]  Pain in legs with walking, [ ]  Pain in legs at rest, [ ]  Pain in legs at night,  [ ]  Non-healing ulcer, [ ]  Blood clot in vein/DVT,   Pulmonary: [ ]  Home oxygen, [ ]  Productive cough, [ ]  Coughing up blood, [ ]  Asthma,  [ ]  Wheezing [ ]  COPD Musculoskeletal:  [ ]  Arthritis, [ ]  Low back pain, [ ]  Joint pain Hematologic: [ ]   Easy Bruising, [ ]  Anemia; [ ]  Hepatitis Gastrointestinal: [ ]  Blood in stool, [ ]  Gastroesophageal Reflux/heartburn, Urinary: [x ] chronic Kidney disease, [ ]  on HD - [ ]  MWF or [ ]  TTHS, [ ]  Burning with urination, [ ]  Difficulty urinating Skin: [ ]  Rashes, [ ]  Wounds Psychological: [ ]  Anxiety, [ ]  Depression  Physical Examination Filed Vitals:   06/22/14 0700 06/22/14 0800 06/22/14 0822 06/22/14 0900  BP: 83/62 80/59  83/63  Pulse: 82 83  83  Temp:   97.6 F (36.4 C)   TempSrc:   Oral   Resp: 19 15  21   Height:      Weight:      SpO2: 99% 100%  100%   Body mass index is 28.58 kg/(m^2).  General:  WDWN in NAD HENT: WNL Eyes: Pupils equal Pulmonary: normal non-labored breathing , positive rhonchi,  wheezing Cardiac: RRR, without  Murmurs, rubs or gallops; No carotid bruits Abdomen: soft, NT, no masses Skin: no rashes, ulcers noted;  no Gangrene , no cellulitis; no open wounds;   Vascular Exam/Pulses:palpable radial and brachial pulses 2+ equal bil.   Musculoskeletal: no muscle wasting or atrophy; positive edema  Neurologic: A&O X 3; Appropriate Affect ;  SENSATION: normal; MOTOR FUNCTION: 5/5 Symmetric Speech is fluent/normal   Significant Diagnostic Studies: CBC Lab Results  Component Value Date   WBC 7.1 06/22/2014   HGB 9.4* 06/22/2014   HCT 29.2* 06/22/2014   MCV 76.0* 06/22/2014   PLT 222 06/22/2014    BMET    Component Value Date/Time   NA 125* 06/22/2014 0010   K 5.3 06/22/2014 0010   CL 90* 06/22/2014 0010   CO2 17* 06/22/2014 0010   GLUCOSE 147* 06/22/2014 0010   BUN 35* 06/22/2014 0010   CREATININE 5.63* 06/22/2014 0010   CALCIUM 9.0 06/22/2014 0010   GFRNONAA 10* 06/22/2014 0010   GFRAA 11* 06/22/2014 0010   Estimated Creatinine Clearance: 15.7 mL/min (by C-G formula based on Cr of 5.63).  COAG Lab Results  Component Value Date   INR 1.40 06/22/2014   INR 1.33 06/21/2014   INR 1.41 06/20/2014     Non-Invasive Vascular Imaging:  pending vein mapping  ASSESSMENT/PLAN:  CKD stage IV Pending vein mapping for fistula verses graft.  He is right hand dominant.  Catheter holiday started 11/11; IR to place Cath tomorrow afternooon after 48hrs holiday- give or take. Per Dr. Kathrene Bongo.   Clinton Gallant [facility] 06/22/2014 10:50 AM   History and exam details as above.  Pt needs long term HD access.  He would prefer left hand since right hand dominant.  Will review vein mapping when complete.  Tentatively place access early next week depending on medical progress.  Fabienne Brunsharles Fields, MD [facility] [place] Office: [phone] Pager: [phone]  ADDENDUM PT SCHEDULED FOR LEFT ARM AV FISTULA VS GRAFT Wednesday NOV 18.  PLEASE AVOID HD ON THIS DAY.  WILL FOLLOW UP ON HIS MEDICAL PROGRESS ON Tuesday November 18  Fabienne Brunsharles Fields, MD [facility] [place] Office: [phone] Pager: [phone]

## 2014-06-22 NOTE — Procedures (Signed)
Successful RT IJ HD CATH TIP SVC/RA NO COMP STABLE READY FOR USE  

## 2014-06-22 NOTE — H&P (Signed)
Reason For Consult: CKD Stage IV requiring access for HD Chief Complaint: Chief Complaint  Patient presents with  . Shortness of Breath  . Nausea    Referring Physician(s): Dr. Kathrene BongoGoldsborough  History of Present Illness: [name] is a 63 y.o. male with CKD stage IV requiring HD. Patient with recent bacteremia 11/1 ID on board and right femoral trialysis catheter and right arm PICC removed 11/11. IR received request for new HD access after 48 hours of line removal. The patient also with end stage HF EF < 15%, cardiology on board. He denies any known complications to sedation. He denies any fever or chills.  Past Medical History  Diagnosis Date  . Diabetes mellitus without complication   . Chronic systolic heart failure     a) ECHO [facility] 02/2014): EF <15%, multiple apical thrombi, RV mod enlarged, mod MR b. [facility]HC (02/21/14[facility]: RA 16, RV 64/9 (15), PA 64/32 (42), PCWP: 24, CI: 1.4  . HTN (hypertension)   . HLD (hyperlipidemia)   . CKD (chronic kidney disease), stage III   . Apical mural thrombus   . Mitral regurgitation   . Drug use     cocaine  . Atrial fibrillation, chronic     History reviewed. No pertinent past surgical history.  Allergies: Review of patient's allergies indicates no known allergies.  Medications: Prior to Admission medications   Medication Sig Start Date End Date Taking? Authorizing Provider  allopurinol (ZYLOPRIM) 100 MG tablet Take 100 mg by mouth daily.   Yes Historical Provider, MD  amiodarone (PACERONE) 200 MG tablet Take 200 mg by mouth daily.   Yes Historical Provider, MD  atorvastatin (LIPITOR) 20 MG tablet Take 20 mg by mouth at bedtime.   Yes Historical Provider, MD  metolazone (ZAROXOLYN) 5 MG tablet Take 5 mg by mouth daily. Take 1 tablet (5mg ) as needed. Only take after 5 pounds weight gain, but before taking this contact cardiologist.   Yes Historical Provider, MD  potassium chloride (K-DUR) 10 MEQ tablet Take 10 mEq by mouth 2  (two) times daily.   Yes Historical Provider, MD  torsemide (DEMADEX) 100 MG tablet Take 100 mg by mouth daily. If weight decreased by 5 pounds cut dose in 1/2 tablet   Yes Historical Provider, MD  warfarin (COUMADIN) 4 MG tablet Take 4 mg by mouth daily.   Yes Historical Provider, MD    History reviewed. No pertinent family history.  History   Social History  . Marital Status: Divorced    Spouse Name: N/A    Number of Children: N/A  . Years of Education: N/A   Social History Main Topics  . Smoking status: Never Smoker   . Smokeless tobacco: Never Used  . Alcohol Use: None  . Drug Use: Yes     Comment: hx cocaine use  . Sexual Activity: None   Other Topics Concern  . None   Social History Narrative   Respiratory therapist   Review of Systems: A 12 point ROS discussed and pertinent positives are indicated in the HPI above.  All other systems are negative.  Review of Systems  Vital Signs: BP 83/63 mmHg  Pulse 83  Temp(Src) 97.6 F (36.4 C) (Oral)  Resp 21  Ht 6' (1.829 m)  Wt 210 lb 12.2 oz (95.6 kg)  BMI 28.58 kg/m2  SpO2 100%  Physical Exam General: A&Ox3, NAD Heart: RRR with systolic murmur heard Lungs: CTA B/L  Imaging: [facility]  06/08/2014   CLINICAL DATA:  Chronic kidney disease.  EXAM: RENAL/URINARY TRACT ULTRASOUND COMPLETE  COMPARISON:  None.  FINDINGS: Right Kidney:  Length: 11.7 cm in length. Echogenicity within normal limits. No mass or hydronephrosis visualized.  Left Kidney:  Length: 11.1 cm in length. Echogenicity within normal limits. No mass or hydronephrosis visualized.  Bladder:  The bladder cannot be visualized. This likely represents that it is completely decompressed.  IMPRESSION: Within normal limits.  The bladder is most likely completely decompressed by the Foley catheter since it was not visualized.   Electronically Signed   By: Maryclare Bean M.D.   On: 06/08/2014 16:35   Dg Chest Port 1 View  06/10/2014   CLINICAL DATA:  Pulmonary edema.   EXAM: PORTABLE CHEST - 1 VIEW  COMPARISON:  06/08/2014  FINDINGS: Endotracheal tube positioning remains high with the tip just at the thoracic inlet and approximately 10 cm proximal to the carina. Stable positioning of PICC line. Lungs show no edema or consolidation. Overall lung expansion is slightly improved bilaterally. Mild atelectasis present in the left lower lobe. There is stable cardiomegaly. No pleural effusions identified. No pneumothorax.  IMPRESSION: Mild left lower lobe atelectasis. The tip of the endotracheal tube remains high and just at the thoracic inlet.   Electronically Signed   By: Irish Lack M.D.   On: 06/10/2014 10:51   Dg Chest Portable 1 View  06/08/2014   CLINICAL DATA:  Intubation. Diabetes. Heart failure. Chronic kidney disease.  EXAM: PORTABLE CHEST - 1 VIEW  COMPARISON:  06/08/2014  FINDINGS: A nasogastric tube extends down to the stomach. A right-sided PICC line terminates over the SVC.  There is some tubing projecting over the lower neck. This is about 2.5 cm above the thoracic inlet. If this is an endotracheal tube, it probably needs to be advanced about 7 cm.  Moderate enlargement of the cardiopericardial silhouette. Low lung volumes.  IMPRESSION: 1. Tubing projects over the lower neck, well above the thoracic inlet. If this is an endotracheal tube, it probably needs to be advanced about 7 cm. 2. Moderate enlargement of the cardiopericardial silhouette. These results will be called to the ordering clinician or representative by the Radiologist Assistant, and communication documented in the PACS or zVision Dashboard.   Electronically Signed   By: Herbie Baltimore M.D.   On: 06/08/2014 12:45   Dg Chest Port 1 View  06/08/2014   CLINICAL DATA:  Shortness of Breath beginning last night with nausea starting this morning. Initial encounter.  EXAM: PORTABLE CHEST - 1 VIEW  COMPARISON:  None.  FINDINGS: 1023 hrs. Right PICC line tip projects at innominate vein confluence. Lungs  are clear without edema or focal airspace consolidation. No pleural effusion. The cardio pericardial silhouette is enlarged. Imaged bony structures of the thorax are intact. Telemetry leads overlie the chest.  IMPRESSION: Enlargement of the cardiopericardial silhouette without edema or focal airspace consolidation.   Electronically Signed   By: Kennith Center M.D.   On: 06/08/2014 10:59    Labs:  CBC:  Recent Labs  06/19/14 0153 06/20/14 0235 06/21/14 0236 06/22/14 0010  WBC 7.0 6.8 6.8 7.1  HGB 9.6* 10.3* 9.1* 9.4*  HCT 30.9* 32.4* 28.6* 29.2*  PLT 166 184 214 222    COAGS:  Recent Labs  06/17/14 0230 06/18/14 0238 06/19/14 0153 06/20/14 0235 06/21/14 0236 06/22/14 0010  INR 1.44 1.36 1.41 1.41 1.33 1.40  APTT 113* 102* 137* 152*  --   --     BMP:  Recent Labs  06/19/14 1600 06/20/14 0235 06/21/14 0236 06/22/14 0010  NA 135* 133* 129* 125*  K 4.0 4.4 4.2 5.3  CL 97 96 93* 90*  CO2 24 24 22  17*  GLUCOSE 180* 213* 133* 147*  BUN 8 12 23  35*  CALCIUM 8.2* 8.6 8.7 9.0  CREATININE 1.44* 2.21* 3.76* 5.63*  GFRNONAA 50* 30* 16* 10*  GFRAA 57* 34* 18* 11*    LIVER FUNCTION TESTS:  Recent Labs  06/08/14 1216  06/09/14 0358 06/10/14 0430  06/11/14 0415  06/19/14 1600 06/20/14 0235 06/21/14 0236 06/22/14 0010  BILITOT 3.9*  --  2.8* 3.2*  --  3.0*  --   --   --   --   --   AST 98*  --  492* 417*  --  360*  --   --   --   --   --   ALT 76*  --  282* 363*  --  380*  --   --   --   --   --   ALKPHOS 94  --  77 95  --  87  --   --   --   --   --   PROT 8.4*  --  7.1 8.8*  --  7.9  --   --   --   --   --   ALBUMIN 3.3*  < > 3.0* 3.4*  < > 3.0*  < > 2.7* 2.7* 2.6* 2.6*  < > = values in this interval not displayed.  Assessment and Plan: Chronic kidney disease stage IV Previous right femoral trialysis cath and right arm PICC line removed 11/11 secondary to bacteremia, afebrile on antibiotics, ID following. Request for new HD catheter with moderate  sedation Apical thrombus on IV heparin-stopped 2 hours prior to HD catheter placement End stage HF EF <15%, cardiology following. Patient has been NPO, IV heparin stopped prior to procedure  Risks and Benefits discussed with the patient. All of the patient's questions were answered, patient is agreeable to proceed. Consent signed and in chart.   Thank you for this interesting consult.  I greatly enjoyed meeting Orlo Hopp and look forward to participating in their care.   I spent a total of 40 minutes face to face in clinical consultation, greater than 50% of which was counseling/coordinating care   Signed: Berneta Levins 06/22/2014, 12:26 PM

## 2014-06-22 NOTE — Progress Notes (Signed)
PT Cancellation Note  Patient Details Name: [name] MRN: [id number] DOB: [birth year]   Cancelled Treatment:    Reason Eval/Treat Not Completed: Patient at procedure or test/unavailable (pt exiting unit on arrival for HD access placement. Will attempt at later date)   [name] 06/22/2014, 12:10 PM [name], PT [phone]

## 2014-06-23 DIAGNOSIS — N186 End stage renal disease: Secondary | ICD-10-CM

## 2014-06-23 LAB — CBC
HEMATOCRIT: 29.2 % — AB (ref 39.0–52.0)
Hemoglobin: 9.4 g/dL — ABNORMAL LOW (ref 13.0–17.0)
MCH: 24.2 pg — ABNORMAL LOW (ref 26.0–34.0)
MCHC: 32.2 g/dL (ref 30.0–36.0)
MCV: 75.3 fL — AB (ref 78.0–100.0)
Platelets: 273 10*3/uL (ref 150–400)
RBC: 3.88 MIL/uL — ABNORMAL LOW (ref 4.22–5.81)
RDW: 20.8 % — AB (ref 11.5–15.5)
WBC: 5.4 10*3/uL (ref 4.0–10.5)

## 2014-06-23 LAB — GLUCOSE, CAPILLARY
GLUCOSE-CAPILLARY: 136 mg/dL — AB (ref 70–99)
GLUCOSE-CAPILLARY: 203 mg/dL — AB (ref 70–99)
Glucose-Capillary: 130 mg/dL — ABNORMAL HIGH (ref 70–99)
Glucose-Capillary: 212 mg/dL — ABNORMAL HIGH (ref 70–99)
Glucose-Capillary: 156 mg/dL — ABNORMAL HIGH (ref 70–99)

## 2014-06-23 LAB — RENAL FUNCTION PANEL
ALBUMIN: 2.6 g/dL — AB (ref 3.5–5.2)
ANION GAP: 18 — AB (ref 5–15)
Albumin: 2.8 g/dL — ABNORMAL LOW (ref 3.5–5.2)
Anion gap: 17 — ABNORMAL HIGH (ref 5–15)
BUN: 31 mg/dL — AB (ref 6–23)
BUN: 35 mg/dL — AB (ref 6–23)
CHLORIDE: 92 meq/L — AB (ref 96–112)
CO2: 19 mEq/L (ref 19–32)
CO2: 21 mEq/L (ref 19–32)
Calcium: 8.9 mg/dL (ref 8.4–10.5)
Calcium: 8.8 mg/dL (ref 8.4–10.5)
Chloride: 88 mEq/L — ABNORMAL LOW (ref 96–112)
Creatinine, Ser: 5.17 mg/dL — ABNORMAL HIGH (ref 0.50–1.35)
Creatinine, Ser: 5.85 mg/dL — ABNORMAL HIGH (ref 0.50–1.35)
GFR calc Af Amer: 12 mL/min — ABNORMAL LOW (ref >90)
GFR calc Af Amer: 11 mL/min — ABNORMAL LOW (ref >90)
GFR calc non Af Amer: 9 mL/min — ABNORMAL LOW (ref >90)
GFR, EST NON AFRICAN AMERICAN: 11 mL/min — AB (ref >90)
GLUCOSE: 233 mg/dL — AB (ref 70–99)
Glucose, Bld: 200 mg/dL — ABNORMAL HIGH (ref 70–99)
PHOSPHORUS: 4.2 mg/dL (ref 2.3–4.6)
POTASSIUM: 4.4 meq/L (ref 3.7–5.3)
Phosphorus: 4.3 mg/dL (ref 2.3–4.6)
Potassium: 4.5 mEq/L (ref 3.7–5.3)
Sodium: 129 mEq/L — ABNORMAL LOW (ref 137–147)
Sodium: 126 mEq/L — ABNORMAL LOW (ref 137–147)

## 2014-06-23 LAB — HEPARIN LEVEL (UNFRACTIONATED)
HEPARIN UNFRACTIONATED: 0.15 [IU]/mL — AB (ref 0.30–0.70)
HEPARIN UNFRACTIONATED: 0.3 [IU]/mL (ref 0.30–0.70)
Heparin Unfractionated: 0.19 IU/mL — ABNORMAL LOW (ref 0.30–0.70)

## 2014-06-23 LAB — PROTIME-INR
INR: 1.23 (ref 0.00–1.49)
PROTHROMBIN TIME: 15.7 s — AB (ref 11.6–15.2)

## 2014-06-23 LAB — VANCOMYCIN, RANDOM: VANCOMYCIN RM: 40.8 ug/mL

## 2014-06-23 MED ORDER — ALBUMIN HUMAN 25 % IV SOLN
25.0000 g | Freq: Once | INTRAVENOUS | Status: AC
  Administered 2014-06-23: 25 g via INTRAVENOUS
  Filled 2014-06-23: qty 100

## 2014-06-23 MED ORDER — ALBUMIN HUMAN 25 % IV SOLN
INTRAVENOUS | Status: AC
  Administered 2014-06-23: 25 g via INTRAVENOUS
  Filled 2014-06-23: qty 200

## 2014-06-23 NOTE — Progress Notes (Signed)
[facility] Rounding Note  Subjective: Had regular HD yesterday. Tolerated quite well , took 3 liters off - BP this AM in the 90's. Will get another HD today    Objective: Vital signs in last 24 hours: Temp:  [97.5 F (36.4 C)-98.1 F (36.7 C)] 98 F (36.7 C) (11/14 0740) Pulse Rate:  [80-98] 86 (11/14 1000) Resp:  [9-26] 11 (11/14 1000) BP: (80-95)/(45-67) 95/60 mmHg (11/14 0933) SpO2:  [95 %-100 %] 100 % (11/14 1000) Weight:  [95.8 kg (211 lb 3.2 oz)-98.884 kg (218 lb)] 98.884 kg (218 lb) (11/14 0641) Weight change:   Intake/Output from previous day: 11/13 0701 - 11/14 0700 In: 406.3 [P.O.:360; I.V.:46.3] Out: 3000   Net - 25 L since admit  Weight Trending 10/30  112.4 kg 10/31  114.1 kg 11/1    113.5 kg 11/2    111.2 kg     11/3     108.5 kg   11/4     106.8 kg   11/5     106.4 kg 11/6    102.4 kg 11/7    102.9 kg 11/8    100.2 kg 11/9     98.9 kg 11/10   96.6 kg 11/11    98.2 kg 11/12    95.6 kg  Total I/O In: 58.5 [I.V.:58.5] Out: -  Physical Examination: BP 95/60 mmHg  Pulse 86  Temp(Src) 98 F (36.7 C) (Oral)  Resp 11  Ht 6' (1.829 m)  Wt 98.884 kg (218 lb)  BMI 29.56 kg/m2  SpO2 100%  Awake; A&Ox3 RRR; S1S2 + s3 w/ 2/6 systolic murmur Lungs CTAB, no crackles heard Trace edema at calves   Recent Labs  06/21/14 0236 06/22/14 0010  WBC 6.8 7.1  HGB 9.1* 9.4*  HCT 28.6* 29.2*  PLT 214 222    Recent Labs  06/22/14 0010 06/23/14 0310  NA 125* 129*  K 5.3 4.4  CL 90* 92*  CO2 17* 19  GLUCOSE 147* 200*  BUN 35* 31*  CREATININE 5.63* 5.17*  CALCIUM 9.0 8.9    Recent Labs  06/22/14 0010 06/23/14 0310  PHOS 4.5 4.3     Results for Lieb, BRALIN (MRN [id number]) as of 06/11/2014 07:18  Ref. Range 06/09/2014 07:30  Iron Latest Range: 42-135 ug/dL 21 (L)  UIBC Latest Range: 125-400 ug/dL 341  TIBC Latest Range: 215-435 ug/dL 962  Saturation Ratios Latest Range: 20-55 % 6 (L)    Studies/Results: Ir Fluoro  Guide Cv Line Right  06/22/2014   CLINICAL DATA:  End-stage renal disease needing dialysis  EXAM: ULTRASOUND GUIDANCE FOR VASCULAR ACCESS  RIGHT INTERNAL JUGULAR PERMANENT HEMODIALYSIS CATHETER  Date:  11/13/201511/13/2015 1:40 pm  Radiologist:  M. Ruel Favors, MD  Guidance:  Ultrasound and fluoroscopic  FLUOROSCOPY TIME:  30 seconds  MEDICATIONS AND MEDICAL HISTORY: 2 mg Versed, 100 mcg fentanyl  ANESTHESIA/SEDATION: 18 min  CONTRAST:  None.  COMPLICATIONS: None immediate  PROCEDURE: Informed consent was obtained from the patient following explanation of the procedure, risks, benefits and alternatives. The patient understands, agrees and consents for the procedure. All questions were addressed. A time out was performed.  Maximal barrier sterile technique utilized including caps, mask, sterile gowns, sterile gloves, large sterile drape, hand hygiene, and 2% chlorhexidine scrub.  Under sterile conditions and local anesthesia, right internal jugular micropuncture venous access was performed with ultrasound. Images were obtained for documentation. A guide wire was inserted followed by a transitional dilator. Next, a 0.035 guidewire was advanced into  the IVC with a 5-French catheter. Measurements were obtained from the right venotomy site to the proximal right atrium. In the right infraclavicular chest, a subcutaneous tunnel was created under sterile conditions and local anesthesia. 1% lidocaine with epinephrine was utilized for this. The 23 cm tip to cuff catheter was tunneled subcutaneously to the venotomy site and inserted into the SVC/RA junction through a valved peel-away sheath. Position was confirmed with fluoroscopy. Images were obtained for documentation. Blood was aspirated from the catheter followed by saline and heparin flushes. The appropriate volume and strength of heparin was instilled in each lumen. Caps were applied. The catheter was secured at the tunnel site with Gelfoam and a pursestring suture.  The venotomy site was closed with subcuticular Vicryl suture. Dermabond was applied to the small right neck incision. A dry sterile dressing was applied. The catheter is ready for use. No immediate complications.  IMPRESSION: Ultrasound and fluoroscopically guided right internal jugular tunneled hemodialysis catheter (23 cm tip to cuff Palindrome catheter).   Electronically Signed   By: Ruel Favors M.D.   On: 06/22/2014 13:58   Ir US Guide Vasc Access Right  06/22/2014   CLINICAL DATA:  End-stage renal disease needing dialysis  EXAM: ULTRASOUND GUIDANCE FOR VASCULAR ACCESS  RIGHT INTERNAL JUGULAR PERMANENT HEMODIALYSIS CATHETER  Date:  11/13/201511/13/2015 1:40 pm  Radiologist:  M. Ruel Favors, MD  Guidance:  Ultrasound and fluoroscopic  FLUOROSCOPY TIME:  30 seconds  MEDICATIONS AND MEDICAL HISTORY: 2 mg Versed, 100 mcg fentanyl  ANESTHESIA/SEDATION: 18 min  CONTRAST:  None.  COMPLICATIONS: None immediate  PROCEDURE: Informed consent was obtained from the patient following explanation of the procedure, risks, benefits and alternatives. The patient understands, agrees and consents for the procedure. All questions were addressed. A time out was performed.  Maximal barrier sterile technique utilized including caps, mask, sterile gowns, sterile gloves, large sterile drape, hand hygiene, and 2% chlorhexidine scrub.  Under sterile conditions and local anesthesia, right internal jugular micropuncture venous access was performed with ultrasound. Images were obtained for documentation. A guide wire was inserted followed by a transitional dilator. Next, a 0.035 guidewire was advanced into the IVC with a 5-French catheter. Measurements were obtained from the right venotomy site to the proximal right atrium. In the right infraclavicular chest, a subcutaneous tunnel was created under sterile conditions and local anesthesia. 1% lidocaine with epinephrine was utilized for this. The 23 cm tip to cuff catheter was tunneled  subcutaneously to the venotomy site and inserted into the SVC/RA junction through a valved peel-away sheath. Position was confirmed with fluoroscopy. Images were obtained for documentation. Blood was aspirated from the catheter followed by saline and heparin flushes. The appropriate volume and strength of heparin was instilled in each lumen. Caps were applied. The catheter was secured at the tunnel site with Gelfoam and a pursestring suture. The venotomy site was closed with subcuticular Vicryl suture. Dermabond was applied to the small right neck incision. A dry sterile dressing was applied. The catheter is ready for use. No immediate complications.  IMPRESSION: Ultrasound and fluoroscopically guided right internal jugular tunneled hemodialysis catheter (23 cm tip to cuff Palindrome catheter).   Electronically Signed   By: Ruel Favors M.D.   On: 06/22/2014 13:58   Scheduled Medications . amiodarone  200 mg Oral Daily  . calcitRIOL  0.25 mcg Oral Daily  . darbepoetin (ARANESP) injection - DIALYSIS  60 mcg Intravenous Q Fri-HD  . feeding supplement (NEPRO CARB STEADY)  237 mL Oral BID BM  .  insulin aspart  0-9 Units Subcutaneous TID WC  . pantoprazole  40 mg Oral Daily   Infusion/other Medications . sodium chloride 10 mL/hr at 06/20/14 0020  . DOBUTamine Stopped (06/22/14 0510)  . 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   He was admitted 10/30 with respiratory failure, worsening heart failure, creatinine of 4.6, hyperkalemia (6.8) and wide complex bradycardia. CRRT initiated 10/30 for volume control, renal failure. Has BC+ 11/1 for Bacillus on Vanc (ID following). CRRT stopped 06/20/14 and 48 hr Line holiday started.   Assessment/Plan:  AKI  on CKD4  Remains anuric. Doubt he will recover any renal fcn. Pt is not candidate for heart transplant or pump therapy. Dobutamine off.   Catheter holiday started 11/11- 11/13; HD Friday and today as well- do not want to get him into too much problem volume wise   Cr inc 5.63 on 11/13 from 2.21 11/11; UOP minimal (15cc in last 24)  Restrict fluids to 1200cc  Dobutamine stopped - so far has tolerated IHD -  I think the plan at this time will be to continue IHD- it remains to be seen if he will need 4 time a week HD or will be able to get by with the regular 3 times a week.  CLIP underway  Would also like to get a permanent access- plan is for AVF on Wednesday  CM with acute on chronic systolic heart failure/NICM  EF < 15% - Off dobutamine on 11/13 Per cardiology.   Hypophosphatemia- Resolved 4.3 on 11/14- no binder  Hyponatremia: will fluctuate with volume status  H/O VT  PAF: INR 1.43 (11/4) IV heparin per Pharm  Anemia stable. Fe deficient have been  withholding IV iron in setting of bacillus bacteremia- hgb now in 9's , have added aranesp  PTH: Elevated at 168: Calcitriol (0.25 mcg) PO qd  Bacillus bacteremia (11/1). ID following. On vanco (11/1>>); zosyn stopped (11/1>>11/3)  Catheter Holiday ends today  F/u BCx - will need TEE if positive  IV vancomycin for 2 weeks with target trough of 15-20, and today being day #4  Please fax weekly bmp, cbc c diff and vancomycin levels to Dr. Daiva EvesVan Dam @ [phone]  Cocaine abuse - none for 4 months     LOS: 15 days    Annie SableKellie Taima Rada, MD 06/23/2014

## 2014-06-23 NOTE — Plan of Care (Signed)
Problem: Phase I Progression Outcomes Goal: Dyspnea controlled at rest Outcome: Not Applicable Date Met:  00/12/39 Goal: Uremic symptoms managed Outcome: Completed/Met Date Met:  06/23/14 Goal: Pain controlled with appropriate interventions Outcome: Completed/Met Date Met:  06/23/14 Goal: OOB as tolerated unless otherwise ordered Outcome: Completed/Met Date Met:  06/23/14 Goal: Initial discharge plan identified Outcome: Completed/Met Date Met:  06/23/14 Goal: Pt./family verbalizes plan of care Outcome: Completed/Met Date Met:  06/23/14

## 2014-06-23 NOTE — Procedures (Signed)
Patient was seen on dialysis and the procedure was supervised.  BFR 400  Via PC BP is  88/50.   Patient appears to be tolerating treatment well so far  Zsazsa Bahena A 06/23/2014

## 2014-06-23 NOTE — Progress Notes (Signed)
ANTICOAGULATION CONSULT NOTE - Follow Up Consult  Pharmacy Consult for Heparin  Indication: atrial fibrillation and apical thrombus  No Known Allergies  Patient Measurements: Height: 6' (182.9 cm) Weight: 212 lb 15.4 oz (96.6 kg) IBW/kg (Calculated) : 77.6  Vital Signs: Temp: 98.1 F (36.7 C) (11/14 1753) Temp Source: Oral (11/14 1753) BP: 95/49 mmHg (11/14 1753) Pulse Rate: 87 (11/14 1753)  Labs:  Recent Labs  06/21/14 0236  06/22/14 0010  06/22/14 2323 06/23/14 0310 06/23/14 0903 06/23/14 1355 06/23/14 1700  HGB 9.1*  --  9.4*  --   --   --   --  9.4*  --   HCT 28.6*  --  29.2*  --   --   --   --  29.2*  --   PLT 214  --  222  --   --   --   --  273  --   LABPROT 16.6*  --  17.3*  --   --  15.7*  --   --   --   INR 1.33  --  1.40  --   --  1.23  --   --   --   HEPARINUNFRC <0.10*  < > 0.41  < > 0.15*  --  0.30  --  0.19*  CREATININE 3.76*  --  5.63*  --   --  5.17*  --  5.85*  --   < > = values in this interval not displayed.  Estimated Creatinine Clearance: 15.2 mL/min (by C-G formula based on Cr of 5.85).    Assessment: Pt with hx afib/apical thrombus on coum pta. INR 4.7 on admit. Coumadin on hold with heparin bridge in place for HD graft placement -planned for Wed. INR down to 1.23. Heparn level 0.3 this morning and has now drifted down to 0.19 on 1950 units/hr. Noted plan to restart coumadin after HD graft when ok with VVS.  No bleeding  Goal of Therapy:  Heparin level 0.3-0.7 units/ml Monitor platelets by anticoagulation protocol: Yes   Plan:  Increase IV Heparin to 2100 units/hr. Will f/u 8 hr HL  Daily HL and CBC  Sheppard Coil PharmD., BCPS Clinical Pharmacist Pager [phone] 06/23/2014 6:59 PM

## 2014-06-23 NOTE — Progress Notes (Signed)
Awaiting vein mapping results Tentatively access for left arm next Wednesday  Fabienne Bruns, MD [facility] Office: [phone] Pager: [phone]

## 2014-06-23 NOTE — Progress Notes (Signed)
ANTICOAGULATION CONSULT NOTE - Follow Up Consult  Pharmacy Consult for Heparin  Indication: atrial fibrillation and apical thrombus  No Known Allergies  Patient Measurements: Height: 6' (182.9 cm) Weight: 218 lb (98.884 kg) IBW/kg (Calculated) : 77.6  Vital Signs: Temp: 98 F (36.7 C) (11/14 0740) Temp Source: Oral (11/14 0740) BP: 95/60 mmHg (11/14 0933) Pulse Rate: 92 (11/14 1100)  Labs:  Recent Labs  06/21/14 0236  06/22/14 0010 06/22/14 0925 06/22/14 2323 06/23/14 0310 06/23/14 0903  HGB 9.1*  --  9.4*  --   --   --   --   HCT 28.6*  --  29.2*  --   --   --   --   PLT 214  --  222  --   --   --   --   LABPROT 16.6*  --  17.3*  --   --  15.7*  --   INR 1.33  --  1.40  --   --  1.23  --   HEPARINUNFRC <0.10*  < > 0.41 0.17* 0.15*  --  0.30  CREATININE 3.76*  --  5.63*  --   --  5.17*  --   < > = values in this interval not displayed.  Estimated Creatinine Clearance: 17.3 mL/min (by C-G formula based on Cr of 5.17).    Assessment: Pt with hx afib/apical thrombus on coum pta. INR 4.7 on admit. Coumadin on hold with heparin bridge in place for HD graft placement -planned for Wed. INR down to 1.23. Heparn level 0.3 (low end of therapeutic) on 1950 units/hr. Noted plan to restart coumadin after HD graft when ok with VVS.  Goal of Therapy:  Heparin level 0.3-0.7 units/ml Monitor platelets by anticoagulation protocol: Yes   Plan:  Continue Heparin at 1950 units/hr. Will f/u 8 hr HL to confirm remains therapeutic Daily HL and CBC  Christoper Fabian, PharmD, BCPS Clinical pharmacist, pager [phone] 06/23/2014,11:46 AM

## 2014-06-23 NOTE — Progress Notes (Signed)
Patient arrived to unit from dialysis. Alert and oriented, no distress noted. Patient ambulated to the bed. Placed on telemetry box #21. Patient eating supper.  Leanna Battles, RN.

## 2014-06-23 NOTE — Progress Notes (Signed)
Patient ID: [name], male   DOB: [birth year], 63 y.o.   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 Maintained on dobutamine 52mcg/kg/min.   Past medical h/o is notable for CKD with baseline Cr 3.0, cocaine abuse, DM2, AF on amio, VT. He has not been candidate for advanced therapies due to cocaine abuse (quit 4 months ago) and renal failure. Started on dobutamine. Moved to [place] to try to stay off cocaine.   On Oct 14 seen at [facility]. CR was 3.3 (on 04/16/2014 Cr was 1.6 at [facility] on discharge) and potassium was low. Kcl was increased. Over past week increased dyspnea and edema. NYHA IIIB symptoms at baseline.   10/30 Admitted with rep failure with wide complex bradycardia. K 6.8 and Cr 4.6. Intubated and CVVHD started. Extubated 11/1.   Bcx + for bacillus.  CVVHD stopped on 11/11 after 35 pound removal.  11/13 Dobutamine weaned off intermittent HD started  Dobutamine weaned off. Underwent intermittent HD yesterday with 3L off. Tolerated well. Plan for repeat iHD today. F/u  bcx are negative to date.   Objective:   Weight Range:  Vital Signs:   Temp:  [97.5 F (36.4 C)-98.1 F (36.7 C)] 98 F (36.7 C) (11/14 0740) Pulse Rate:  [80-98] 92 (11/14 1100) Resp:  [9-26] 23 (11/14 1100) BP: (80-95)/(45-67) 95/60 mmHg (11/14 0933) SpO2:  [95 %-100 %] 100 % (11/14 1100) Weight:  [95.8 kg (211 lb 3.2 oz)-98.884 kg (218 lb)] 98.884 kg (218 lb) (11/14 0641) Last BM Date: 06/21/14  Weight change: Filed Weights   06/22/14 1435 06/22/14 1735 06/23/14 0641  Weight: 98.1 kg (216 lb 4.3 oz) 95.8 kg (211 lb 3.2 oz) 98.884 kg (218 lb)    Intake/Output:   Intake/Output Summary (Last 24 hours) at 06/23/14 1136 Last data filed at 06/23/14 1100  Gross per 24 hour  Intake    438 ml  Output   3000 ml  Net  -2562 ml     Physical Exam:  General: NAD  Sitting in bed HEENT:  normal  Neck: supple. JVP 9 Carotids 2+ bilat; no bruits. No lymphadenopathy or thryomegaly appreciated.  Cor: PMI laterally displaced. Regular rate & rhythm. 2/6 MR +s3  Lungs: clear Abdomen: soft, nontender, nondistended. No hepatosplenomegaly. No bruits or masses. Good bowel sounds.  Extremities: no cyanosis, clubbing, rash, 1+ edema Neuro: alert & orientedx3, cranial nerves grossly intact. moves all 4 extremities w/o difficulty. Affect pleasant   Telemetry: SR 90s  Labs: Basic Metabolic Panel:  Recent Labs Lab 06/17/14 0230  06/18/14 0238  06/19/14 0153 06/19/14 1600 06/20/14 0235 06/21/14 0236 06/22/14 0010 06/23/14 0310  NA 134*  < > 134*  < > 128* 135* 133* 129* 125* 129*  K 4.4  < > 4.4  < > 4.2 4.0 4.4 4.2 5.3 4.4  CL 97  < > 96  < > 94* 97 96 93* 90* 92*  CO2 23  < > 24  < > 22 24 24 22  17* 19  GLUCOSE 118*  < > 109*  < > 139* 180* 213* 133* 147* 200*  BUN 10  < > 11  < > 12 8 12 23  35* 31*  CREATININE 1.99*  < > 1.87*  < > 2.05* 1.44* 2.21* 3.76* 5.63* 5.17*  CALCIUM 8.4  < > 8.5  < > 8.5 8.2* 8.6 8.7 9.0 8.9  MG 2.6*  --  2.5  --  2.4  --  2.5  --   --   --   PHOS 2.6  < > 2.8  < > 2.8 1.8* 2.9 3.6 4.5 4.3  < > = values in this interval not displayed.  Liver Function Tests:  Recent Labs Lab 06/19/14 1600 06/20/14 0235 06/21/14 0236 06/22/14 0010 06/23/14 0310  ALBUMIN 2.7* 2.7* 2.6* 2.6* 2.6*   No results for input(s): LIPASE, AMYLASE in the last 168 hours. No results for input(s): AMMONIA in the last 168 hours.  CBC:  Recent Labs Lab 06/18/14 0238 06/19/14 0153 06/20/14 0235 06/21/14 0236 06/22/14 0010  WBC 6.4 7.0 6.8 6.8 7.1  HGB 9.9* 9.6* 10.3* 9.1* 9.4*  HCT 31.5* 30.9* 32.4* 28.6* 29.2*  MCV 77.4* 77.1* 77.0* 76.1* 76.0*  PLT 169 166 184 214 222    Cardiac Enzymes: No results for input(s): CKTOTAL, CKMB, CKMBINDEX, TROPONINI in the last 168 hours.  BNP: BNP (last 3 results)  Recent Labs  06/08/14 1040 06/12/14 0450  PROBNP  11335.0* 6716.0*     Other results:    Imaging: Ir Fluoro Guide Cv Line Right  06/22/2014   CLINICAL DATA:  End-stage renal disease needing dialysis  EXAM: ULTRASOUND GUIDANCE FOR VASCULAR ACCESS  RIGHT INTERNAL JUGULAR PERMANENT HEMODIALYSIS CATHETER  Date:  11/13/201511/13/2015 1:40 pm  Radiologist:  M. Ruel Favors, MD  Guidance:  Ultrasound and fluoroscopic  FLUOROSCOPY TIME:  30 seconds  MEDICATIONS AND MEDICAL HISTORY: 2 mg Versed, 100 mcg fentanyl  ANESTHESIA/SEDATION: 18 min  CONTRAST:  None.  COMPLICATIONS: None immediate  PROCEDURE: Informed consent was obtained from the patient following explanation of the procedure, risks, benefits and alternatives. The patient understands, agrees and consents for the procedure. All questions were addressed. A time out was performed.  Maximal barrier sterile technique utilized including caps, mask, sterile gowns, sterile gloves, large sterile drape, hand hygiene, and 2% chlorhexidine scrub.  Under sterile conditions and local anesthesia, right internal jugular micropuncture venous access was performed with ultrasound. Images were obtained for documentation. A guide wire was inserted followed by a transitional dilator. Next, a 0.035 guidewire was advanced into the IVC with a 5-French catheter. Measurements were obtained from the right venotomy site to the proximal right atrium. In the right infraclavicular chest, a subcutaneous tunnel was created under sterile conditions and local anesthesia. 1% lidocaine with epinephrine was utilized for this. The 23 cm tip to cuff catheter was tunneled subcutaneously to the venotomy site and inserted into the SVC/RA junction through a valved peel-away sheath. Position was confirmed with fluoroscopy. Images were obtained for documentation. Blood was aspirated from the catheter followed by saline and heparin flushes. The appropriate volume and strength of heparin was instilled in each lumen. Caps were applied. The catheter was  secured at the tunnel site with Gelfoam and a pursestring suture. The venotomy site was closed with subcuticular Vicryl suture. Dermabond was applied to the small right neck incision. A dry sterile dressing was applied. The catheter is ready for use. No immediate complications.  IMPRESSION: Ultrasound and fluoroscopically guided right internal jugular tunneled hemodialysis catheter (23 cm tip to cuff Palindrome catheter).   Electronically Signed   By: Ruel Favors M.D.   On: 06/22/2014 13:58   Ir US Guide Vasc Access Right  06/22/2014   CLINICAL DATA:  End-stage renal disease needing dialysis  EXAM: ULTRASOUND GUIDANCE FOR VASCULAR ACCESS  RIGHT INTERNAL JUGULAR PERMANENT HEMODIALYSIS CATHETER  Date:  11/13/201511/13/2015 1:40 pm  Radiologist:  Judie PetitM. Ruel Favorsrevor Shick, MD  Guidance:  Ultrasound and fluoroscopic  FLUOROSCOPY TIME:  30 seconds  MEDICATIONS AND MEDICAL HISTORY: 2 mg Versed, 100 mcg fentanyl  ANESTHESIA/SEDATION: 18 min  CONTRAST:  None.  COMPLICATIONS: None immediate  PROCEDURE: Informed consent was obtained from the patient following explanation of the procedure, risks, benefits and alternatives. The patient understands, agrees and consents for the procedure. All questions were addressed. A time out was performed.  Maximal barrier sterile technique utilized including caps, mask, sterile gowns, sterile gloves, large sterile drape, hand hygiene, and 2% chlorhexidine scrub.  Under sterile conditions and local anesthesia, right internal jugular micropuncture venous access was performed with ultrasound. Images were obtained for documentation. A guide wire was inserted followed by a transitional dilator. Next, a 0.035 guidewire was advanced into the IVC with a 5-French catheter. Measurements were obtained from the right venotomy site to the proximal right atrium. In the right infraclavicular chest, a subcutaneous tunnel was created under sterile conditions and local anesthesia. 1% lidocaine with epinephrine  was utilized for this. The 23 cm tip to cuff catheter was tunneled subcutaneously to the venotomy site and inserted into the SVC/RA junction through a valved peel-away sheath. Position was confirmed with fluoroscopy. Images were obtained for documentation. Blood was aspirated from the catheter followed by saline and heparin flushes. The appropriate volume and strength of heparin was instilled in each lumen. Caps were applied. The catheter was secured at the tunnel site with Gelfoam and a pursestring suture. The venotomy site was closed with subcuticular Vicryl suture. Dermabond was applied to the small right neck incision. A dry sterile dressing was applied. The catheter is ready for use. No immediate complications.  IMPRESSION: Ultrasound and fluoroscopically guided right internal jugular tunneled hemodialysis catheter (23 cm tip to cuff Palindrome catheter).   Electronically Signed   By: Ruel Favorsrevor  Shick M.D.   On: 06/22/2014 13:58     Medications:     Scheduled Medications: . amiodarone  200 mg Oral Daily  . calcitRIOL  0.25 mcg Oral Daily  . darbepoetin (ARANESP) injection - DIALYSIS  60 mcg Intravenous Q Fri-HD  . feeding supplement (NEPRO CARB STEADY)  237 mL Oral BID BM  . insulin aspart  0-9 Units Subcutaneous TID WC  . pantoprazole  40 mg Oral Daily    Infusions: . sodium chloride 10 mL/hr at 06/20/14 0020  . DOBUTamine Stopped (06/22/14 0510)  . heparin 1,950 Units/hr (06/23/14 1038)    PRN Medications: sodium chloride, acetaminophen, oxyCODONE   Assessment:   1. Acute respiratory failure  2. A/c systolic HF due to NICM EF < 15%  3. Acute on chronic renal failure stage IV  4. Hyperkalemia  5. Cardiogenic shock  6. DM2  7. Cocaine abuse in remission 3-4 months  8. H/o gout  9. Supratherapeutic INR  10. LV thrombus  11. PAF on amio  12. H/o VT 13. Fever 11/1   Plan/Discussion:     He is off dobutamine now. Tolerated first session of intermittent HD well. Will go  again today. BP soft but stable with SBP 80-90s. He can move to tele. I have update [facility] on his situation.   Will need to restart coumadin when OK with VVS (graft placement pending). Continue heparin for now.   If bcx are repeat + will need TEE.   Yulisa Chirico,MD 11:36 AM

## 2014-06-23 NOTE — Progress Notes (Signed)
Hemodialysis- Pt went unresponsive x approx 20 seconds on HD, HOB down and rinsed back with an additional 250cc bolus (750 cc total). Pt then alert and oriented but shaken. O2 applied for comfort at 2L, sats 99-100%. Unable to obtain bp at initial time of incident d/t immediately running saline. BP 81/42 after saline started. Dr. Kathrene Bongo notified. Restarted HD and reduced goal. Given albumin for bp support, bp now 101/60 HR 87. Continue to monitor patient.

## 2014-06-23 NOTE — Progress Notes (Signed)
VASCULAR LAB PRELIMINARY  PRELIMINARY  PRELIMINARY  PRELIMINARY  Right  Upper Extremity Vein Map    Cephalic  Segment Diameter Depth Comment  1. Axilla 3.7 mm mm   2. Upper upper arm 3.1 mm mm   3. Mid upper arm 4.5 mm mm   4. Above AC 4.8 mm mm Branch   5. In AC 3.6 mm mm   6. Below AC 3.5 mm mm Tortuous  7. Mid forearm 3.5 mm mm   8. Above wrist mm mm IV   mm mm    mm mm    Basilic  Segment Diameter Depth Comment  1. Axilla 3.4 mm 11.6 mm   2. Upper upper arm 4.3 mm 14.4 mm   3. Mid upper arm 4.2 mm 17.4 mm   4. Above AC 3.8 mm 12.1 mm Branch   5. In AC 3.7 mm 8.8 mm   6. Below AC 3.7 mm 4.9 mm                         Left Upper Extremity Vein Map    Cephalic  Segment Diameter Depth Comment  1. Axilla mm mm Too small to measure  2. Mid upper arm 1.7 mm mm   3. Above AC 1.1 mm mm   4. In AC 3.1 mm mm Branch   5. Below AC 2.2 mm mm   6. Mid forearm 2.7 mm mm Branch   7. Wrist 2.0 mm mm    mm mm    mm mm    mm mm    Basilic  Segment Diameter Depth Comment  1. Axilla 7.5 mm 20.0 mm   2. Mid upper arm 5.4 mm 20.0 mm   3. Above AC 4.7 mm 14.6 mm   4. In AC 3.8 mm 7.0 mm   5. Below AC 4.3 mm 5.0 mm                              Jameelah Watts, RVT 06/23/2014, 8:46 AM

## 2014-06-23 NOTE — Progress Notes (Signed)
Hemodialysis- Pt tolerated rest of treatment well. Only able to UF 1.5L d/t hypotensive episode. Report called to [facility] on 6E, pt transferring to 6E21. BP 95/49, HR 86, sats 100% and pt has no complaints.

## 2014-06-23 NOTE — Progress Notes (Signed)
ANTIBIOTIC CONSULT NOTE - FOLLOW UP  Pharmacy Consult for Vancomycin  Indication: Bacteremia  No Known Allergies   Labs:  Recent Labs  06/21/14 0236 06/22/14 0010  WBC 6.8 7.1  HGB 9.1* 9.4*  PLT 214 222  CREATININE 3.76* 5.63*   Recent Labs  06/23/14 0310  VANCORANDOM 40.8     Assessment: Vancomycin level is elevated at 40.8, even with pt receiving an HD treatment 11/13 (no supplemental dose was given). Pt is transitioning to iHD and it looks like plan is for another iHD session today.   Goal of Therapy:  Pre-HD vancomycin level 15-25 mg/L  Plan:  -DC current vancomycin orders -Re-check random vancomycin level 11/15 with AM labs (pt will have received another HD treatment by then) -Re-dose as appropriate   Abran Duke 06/23/2014,4:19 AM

## 2014-06-23 NOTE — Progress Notes (Signed)
ANTICOAGULATION CONSULT NOTE - Follow Up Consult  Pharmacy Consult for Heparin  Indication: atrial fibrillation and apical thrombus  No Known Allergies  Patient Measurements: Height: 6' (182.9 cm) Weight: 211 lb 3.2 oz (95.8 kg) IBW/kg (Calculated) : 77.6  Vital Signs: Temp: 98.1 F (36.7 C) (11/14 0003) Temp Source: Oral (11/14 0003) BP: 91/57 mmHg (11/13 2345) Pulse Rate: 81 (11/13 2345)  Labs:  Recent Labs  06/20/14 0235 06/21/14 0236  06/22/14 0010 06/22/14 0925 06/22/14 2323  HGB 10.3* 9.1*  --  9.4*  --   --   HCT 32.4* 28.6*  --  29.2*  --   --   PLT 184 214  --  222  --   --   APTT 152*  --   --   --   --   --   LABPROT 17.4* 16.6*  --  17.3*  --   --   INR 1.41 1.33  --  1.40  --   --   HEPARINUNFRC 0.56 <0.10*  < > 0.41 0.17* 0.15*  CREATININE 2.21* 3.76*  --  5.63*  --   --   < > = values in this interval not displayed.  Estimated Creatinine Clearance: 15.7 mL/min (by C-G formula based on Cr of 5.63).    Assessment: Sub-therapeutic heparin level after re-start s/p HD cath placement. No issues per RN.   Goal of Therapy:  Heparin level 0.3-0.7 units/ml Monitor platelets by anticoagulation protocol: Yes   Plan:  -Increase heparin to 1950 units/hr -0900 HL to confirm -Daily CBC/HL -Monitor for bleeding  Abran Duke 06/23/2014,12:49 AM

## 2014-06-24 LAB — RENAL FUNCTION PANEL
Albumin: 2.9 g/dL — ABNORMAL LOW (ref 3.5–5.2)
Anion gap: 15 (ref 5–15)
BUN: 24 mg/dL — ABNORMAL HIGH (ref 6–23)
CO2: 26 meq/L (ref 19–32)
CREATININE: 4.54 mg/dL — AB (ref 0.50–1.35)
Calcium: 8.8 mg/dL (ref 8.4–10.5)
Chloride: 93 mEq/L — ABNORMAL LOW (ref 96–112)
GFR, EST AFRICAN AMERICAN: 14 mL/min — AB (ref >90)
GFR, EST NON AFRICAN AMERICAN: 12 mL/min — AB (ref >90)
Glucose, Bld: 130 mg/dL — ABNORMAL HIGH (ref 70–99)
Phosphorus: 3.2 mg/dL (ref 2.3–4.6)
Potassium: 4 mEq/L (ref 3.7–5.3)
SODIUM: 134 meq/L — AB (ref 137–147)

## 2014-06-24 LAB — GLUCOSE, CAPILLARY
Glucose-Capillary: 126 mg/dL — ABNORMAL HIGH (ref 70–99)
Glucose-Capillary: 160 mg/dL — ABNORMAL HIGH (ref 70–99)
Glucose-Capillary: 258 mg/dL — ABNORMAL HIGH (ref 70–99)
Glucose-Capillary: 189 mg/dL — ABNORMAL HIGH (ref 70–99)

## 2014-06-24 LAB — VANCOMYCIN, RANDOM: VANCOMYCIN RM: 31.4 ug/mL

## 2014-06-24 LAB — HEPARIN LEVEL (UNFRACTIONATED): HEPARIN UNFRACTIONATED: 0.33 [IU]/mL (ref 0.30–0.70)

## 2014-06-24 MED ORDER — DOXERCALCIFEROL 4 MCG/2ML IV SOLN
2.0000 ug | INTRAVENOUS | Status: DC
  Administered 2014-06-25 – 2014-06-27 (×2): 2 ug via INTRAVENOUS
  Filled 2014-06-24 (×2): qty 2

## 2014-06-24 MED ORDER — KIDNEY FAILURE BOOK
Freq: Once | Status: AC
  Administered 2014-06-24: 1
  Filled 2014-06-24: qty 1

## 2014-06-24 NOTE — Plan of Care (Signed)
Problem: Phase I Progression Outcomes Goal: Hemodynamically stable Outcome: Completed/Met Date Met:  06/24/14 VSS.

## 2014-06-24 NOTE — Progress Notes (Signed)
[facility] Rounding Note  Subjective: Had regular HD again yesterday.  Had a syncopal episode-  took 1.7 liters off - BP this AM in the 90's.   Objective: Vital signs in last 24 hours: Temp:  [97.9 F (36.6 C)-98.4 F (36.9 C)] 98.4 F (36.9 C) (11/15 0942) Pulse Rate:  [63-98] 81 (11/15 0942) Resp:  [18-25] 18 (11/15 0942) BP: (81-101)/(46-77) 91/77 mmHg (11/15 0942) SpO2:  [96 %-100 %] 98 % (11/15 0942) Weight:  [212 lb 15.4 oz (96.6 kg)-220 lb (99.791 kg)] 220 lb (99.791 kg) (11/14 1958) Weight change: 0 lb (0 kg)  Intake/Output from previous day: 11/14 0701 - 11/15 0700 In: 1724.8 [P.O.:880; I.V.:844.8] Out: 1714   Net - 25 L since admit  Weight Trending 10/30  112.4 kg 10/31  114.1 kg 11/1    113.5 kg 11/2    111.2 kg     11/3     108.5 kg   11/4     106.8 kg   11/5     106.4 kg 11/6    102.4 kg 11/7    102.9 kg 11/8    100.2 kg 11/9     98.9 kg 11/10   96.6 kg 11/11    98.2 kg 11/12    95.6 kg 11/14   99.7 kg  Total I/O In: 240 [P.O.:240] Out: -  Physical Examination: BP 91/77 mmHg  Pulse 81  Temp(Src) 98.4 F (36.9 C) (Oral)  Resp 18  Ht 6' (1.829 m)  Wt 220 lb (99.791 kg)  BMI 29.83 kg/m2  SpO2 98%  Awake; A&Ox3 RRR; S1S2 + s3 w/ 2/6 systolic murmur Lungs CTAB, no crackles heard Trace edema at calves   Recent Labs  06/22/14 0010 06/23/14 1355  WBC 7.1 5.4  HGB 9.4* 9.4*  HCT 29.2* 29.2*  PLT 222 273    Recent Labs  06/23/14 1355 06/24/14 0435  NA 126* 134*  K 4.5 4.0  CL 88* 93*  CO2 21 26  GLUCOSE 233* 130*  BUN 35* 24*  CREATININE 5.85* 4.54*  CALCIUM 8.8 8.8    Recent Labs  06/23/14 1355 06/24/14 0435  PHOS 4.2 3.2     Results for Gleaves, Iantha FallenKENNETH (MRN [id number]) as of 06/11/2014 07:18  Ref. Range 06/09/2014 07:30  Iron Latest Range: 42-135 ug/dL 21 (L)  UIBC Latest Range: 125-400 ug/dL 841320  TIBC Latest Range: 215-435 ug/dL 660341  Saturation Ratios Latest Range: 20-55 % 6 (L)     Studies/Results: Ir Fluoro Guide Cv Line Right  06/22/2014   CLINICAL DATA:  End-stage renal disease needing dialysis  EXAM: ULTRASOUND GUIDANCE FOR VASCULAR ACCESS  RIGHT INTERNAL JUGULAR PERMANENT HEMODIALYSIS CATHETER  Date:  11/13/201511/13/2015 1:40 pm  Radiologist:  M. Ruel Favorsrevor Shick, MD  Guidance:  Ultrasound and fluoroscopic  FLUOROSCOPY TIME:  30 seconds  MEDICATIONS AND MEDICAL HISTORY: 2 mg Versed, 100 mcg fentanyl  ANESTHESIA/SEDATION: 18 min  CONTRAST:  None.  COMPLICATIONS: None immediate  PROCEDURE: Informed consent was obtained from the patient following explanation of the procedure, risks, benefits and alternatives. The patient understands, agrees and consents for the procedure. All questions were addressed. A time out was performed.  Maximal barrier sterile technique utilized including caps, mask, sterile gowns, sterile gloves, large sterile drape, hand hygiene, and 2% chlorhexidine scrub.  Under sterile conditions and local anesthesia, right internal jugular micropuncture venous access was performed with ultrasound. Images were obtained for documentation. A guide wire was inserted followed by a transitional dilator. Next, a  0.035 guidewire was advanced into the IVC with a 5-French catheter. Measurements were obtained from the right venotomy site to the proximal right atrium. In the right infraclavicular chest, a subcutaneous tunnel was created under sterile conditions and local anesthesia. 1% lidocaine with epinephrine was utilized for this. The 23 cm tip to cuff catheter was tunneled subcutaneously to the venotomy site and inserted into the SVC/RA junction through a valved peel-away sheath. Position was confirmed with fluoroscopy. Images were obtained for documentation. Blood was aspirated from the catheter followed by saline and heparin flushes. The appropriate volume and strength of heparin was instilled in each lumen. Caps were applied. The catheter was secured at the tunnel site with  Gelfoam and a pursestring suture. The venotomy site was closed with subcuticular Vicryl suture. Dermabond was applied to the small right neck incision. A dry sterile dressing was applied. The catheter is ready for use. No immediate complications.  IMPRESSION: Ultrasound and fluoroscopically guided right internal jugular tunneled hemodialysis catheter (23 cm tip to cuff Palindrome catheter).   Electronically Signed   By: Ruel Favors M.D.   On: 06/22/2014 13:58   Ir US Guide Vasc Access Right  06/22/2014   CLINICAL DATA:  End-stage renal disease needing dialysis  EXAM: ULTRASOUND GUIDANCE FOR VASCULAR ACCESS  RIGHT INTERNAL JUGULAR PERMANENT HEMODIALYSIS CATHETER  Date:  11/13/201511/13/2015 1:40 pm  Radiologist:  M. Ruel Favors, MD  Guidance:  Ultrasound and fluoroscopic  FLUOROSCOPY TIME:  30 seconds  MEDICATIONS AND MEDICAL HISTORY: 2 mg Versed, 100 mcg fentanyl  ANESTHESIA/SEDATION: 18 min  CONTRAST:  None.  COMPLICATIONS: None immediate  PROCEDURE: Informed consent was obtained from the patient following explanation of the procedure, risks, benefits and alternatives. The patient understands, agrees and consents for the procedure. All questions were addressed. A time out was performed.  Maximal barrier sterile technique utilized including caps, mask, sterile gowns, sterile gloves, large sterile drape, hand hygiene, and 2% chlorhexidine scrub.  Under sterile conditions and local anesthesia, right internal jugular micropuncture venous access was performed with ultrasound. Images were obtained for documentation. A guide wire was inserted followed by a transitional dilator. Next, a 0.035 guidewire was advanced into the IVC with a 5-French catheter. Measurements were obtained from the right venotomy site to the proximal right atrium. In the right infraclavicular chest, a subcutaneous tunnel was created under sterile conditions and local anesthesia. 1% lidocaine with epinephrine was utilized for this. The 23 cm  tip to cuff catheter was tunneled subcutaneously to the venotomy site and inserted into the SVC/RA junction through a valved peel-away sheath. Position was confirmed with fluoroscopy. Images were obtained for documentation. Blood was aspirated from the catheter followed by saline and heparin flushes. The appropriate volume and strength of heparin was instilled in each lumen. Caps were applied. The catheter was secured at the tunnel site with Gelfoam and a pursestring suture. The venotomy site was closed with subcuticular Vicryl suture. Dermabond was applied to the small right neck incision. A dry sterile dressing was applied. The catheter is ready for use. No immediate complications.  IMPRESSION: Ultrasound and fluoroscopically guided right internal jugular tunneled hemodialysis catheter (23 cm tip to cuff Palindrome catheter).   Electronically Signed   By: Ruel Favors M.D.   On: 06/22/2014 13:58   Scheduled Medications . amiodarone  200 mg Oral Daily  . calcitRIOL  0.25 mcg Oral Daily  . darbepoetin (ARANESP) injection - DIALYSIS  60 mcg Intravenous Q Fri-HD  . feeding supplement (NEPRO CARB STEADY)  237 mL Oral BID BM  . insulin aspart  0-9 Units Subcutaneous TID WC  . kidney failure book   Does not apply Once  . pantoprazole  40 mg Oral Daily   Infusion/other Medications . sodium chloride 10 mL/hr at 06/20/14 0020  . 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   He was admitted 10/30 with respiratory failure, worsening heart failure, creatinine of 4.6, hyperkalemia (6.8) and wide complex bradycardia. CRRT initiated 10/30 for volume control, renal failure. Has BC+ 11/1 for Bacillus on Vanc (ID following). CRRT stopped 06/20/14 and 48 hr Line  holiday started.   Assessment/Plan:  1) AKI on CKD4  Remains anuric. Doubt he will recover any renal fcn. Pt is not candidate for heart transplant or pump therapy. Dobutamine off. CVVH (10/30 >11/11) Dobutamine stopped - so far has tolerated IHD -  I think the plan at this time will be to continue IHD- it remains to be seen if he will need 4 time a week HD or will be able to get by with the regular 3 times a week.  CLIP underway  HD 11/13 & 11/14.  Plan next for tomorrow.  Do not want to get him into too much problem volume wise   Restrict fluids to 1200cc  Would also like to get a permanent access- plan is for AVF on Wednesday  Hypophosphatemia- Resolved 4.3 on 11/14- no binder yet  Hyponatremia: will fluctuate with volume status 2) CM with acute on chronic systolic heart failure/NICM  EF < 15% - Off dobutamine on 11/13 Per cardiology.  3) H/O VT 4) PAF: INR 1.43 (11/4) IV heparin per Pharm 5) Anemia stable.  hgb now in 9's   Fe deficient have been withholding IV iron in setting of bacillus bacteremia  aranesp given 11/13 6) PTH: Elevated at 168: Calcitriol (0.25 mcg) PO qd- will change to hectorol  7) Bacillus bacteremia (11/1). ID following. On vanco (11/1>>); zosyn stopped (11/1>>11/3)  Catheter Holiday 11/11- 11/13;  F/u BCx: NGTD- will need TEE if positive  IV vancomycin for 2 weeks (11/11>>) with target trough of 15-20; End Date = 11/25  Vanc not given 11/14 for high level  Please fax weekly bmp, cbc c diff and vancomycin levels to Dr. Daiva Eves @ [phone] 8) Cocaine abuse - none for 4 months     LOS: 16 days  Jamal Collin, MD 06/24/2014, 10:15 AM   Patient seen and examined, agree with above note with above modifications. Looks good.  Had "episode " of essentially passing out with HD yest- but resolved quickly and were able to restart HD- still feeling well.  Plan for HD on Monday- then to get perm access on Wednesday- CLIP in process- best case scenario home after  AVF unless INR needs to be therapeutic at discharge  Annie Sable, MD 06/24/2014

## 2014-06-24 NOTE — Progress Notes (Signed)
Patient ID: [name], male   DOB: [birth year], 63 y.o.   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 Maintained on dobutamine 64mcg/kg/min.   Past medical h/o is notable for CKD with baseline Cr 3.0, cocaine abuse, DM2, AF on amio, VT. He has not been candidate for advanced therapies due to cocaine abuse (quit 4 months ago) and renal failure. Started on dobutamine. Moved to [place] to try to stay off cocaine.   On Oct 14 seen at [facility]. CR was 3.3 (on 04/16/2014 Cr was 1.6 at [facility] on discharge) and potassium was low. Kcl was increased. Over past week increased dyspnea and edema. NYHA IIIB symptoms at baseline.   10/30 Admitted with rep failure with wide complex bradycardia. K 6.8 and Cr 4.6. Intubated and CVVHD started. Extubated 11/1.   Bcx + for bacillus.  CVVHD stopped on 11/11 after 35 pound removal.  11/13 Dobutamine weaned off intermittent HD started  Dobutamine weaned off. Underwent intermittent HD Friday and Saturday. Toelrated well. But weight up 8 pounds (?) Denies dyspnea. SBP 80-100s.   F/u  bcx are negative to date.   Objective:   Weight Range:  Vital Signs:   Temp:  [97.9 F (36.6 C)-98.4 F (36.9 C)] 98.4 F (36.9 C) (11/15 0942) Pulse Rate:  [63-98] 81 (11/15 0942) Resp:  [18-25] 18 (11/15 0942) BP: (81-101)/(46-77) 91/77 mmHg (11/15 0942) SpO2:  [96 %-100 %] 98 % (11/15 0942) Weight:  [96.6 kg (212 lb 15.4 oz)-99.791 kg (220 lb)] 99.791 kg (220 lb) (11/14 1958) Last BM Date: 06/23/14  Weight change: Filed Weights   06/23/14 1341 06/23/14 1753 06/23/14 1958  Weight: 98.1 kg (216 lb 4.3 oz) 96.6 kg (212 lb 15.4 oz) 99.791 kg (220 lb)    Intake/Output:   Intake/Output Summary (Last 24 hours) at 06/24/14 1106 Last data filed at 06/24/14 0900  Gross per 24 hour  Intake 1886.8 ml  Output   1714 ml  Net  172.8 ml     Physical Exam:   General: NAD  Lying in bed HEENT: normal  Neck: supple. JVP jaw Carotids 2+ bilat; no bruits. No lymphadenopathy or thryomegaly appreciated.  Cor: PMI laterally displaced. Regular rate & rhythm. 2/6 MR +s3  Lungs: clear Abdomen: soft, nontender, nondistended. No hepatosplenomegaly. No bruits or masses. Good bowel sounds.  Extremities: no cyanosis, clubbing, rash, 1+ edema Neuro: alert & orientedx3, cranial nerves grossly intact. moves all 4 extremities w/o difficulty. Affect pleasant   Telemetry: SR 90s  Labs: Basic Metabolic Panel:  Recent Labs Lab 06/18/14 0238  06/19/14 0153  06/20/14 0235 06/21/14 0236 06/22/14 0010 06/23/14 0310 06/23/14 1355 06/24/14 0435  NA 134*  < > 128*  < > 133* 129* 125* 129* 126* 134*  K 4.4  < > 4.2  < > 4.4 4.2 5.3 4.4 4.5 4.0  CL 96  < > 94*  < > 96 93* 90* 92* 88* 93*  CO2 24  < > 22  < > 24 22 17* 19 21 26   GLUCOSE 109*  < > 139*  < > 213* 133* 147* 200* 233* 130*  BUN 11  < > 12  < > 12 23 35* 31* 35* 24*  CREATININE 1.87*  < > 2.05*  < > 2.21* 3.76* 5.63* 5.17* 5.85* 4.54*  CALCIUM 8.5  < > 8.5  < > 8.6 8.7 9.0  8.9 8.8 8.8  MG 2.5  --  2.4  --  2.5  --   --   --   --   --   PHOS 2.8  < > 2.8  < > 2.9 3.6 4.5 4.3 4.2 3.2  < > = values in this interval not displayed.  Liver Function Tests:  Recent Labs Lab 06/21/14 0236 06/22/14 0010 06/23/14 0310 06/23/14 1355 06/24/14 0435  ALBUMIN 2.6* 2.6* 2.6* 2.8* 2.9*   No results for input(s): LIPASE, AMYLASE in the last 168 hours. No results for input(s): AMMONIA in the last 168 hours.  CBC:  Recent Labs Lab 06/19/14 0153 06/20/14 0235 06/21/14 0236 06/22/14 0010 06/23/14 1355  WBC 7.0 6.8 6.8 7.1 5.4  HGB 9.6* 10.3* 9.1* 9.4* 9.4*  HCT 30.9* 32.4* 28.6* 29.2* 29.2*  MCV 77.1* 77.0* 76.1* 76.0* 75.3*  PLT 166 184 214 222 273    Cardiac Enzymes: No results for input(s): CKTOTAL, CKMB, CKMBINDEX, TROPONINI in the last 168 hours.  BNP: BNP (last 3 results)  Recent  Labs  06/08/14 1040 06/12/14 0450  PROBNP 11335.0* 6716.0*     Other results:    Imaging: Ir Fluoro Guide Cv Line Right  06/22/2014   CLINICAL DATA:  End-stage renal disease needing dialysis  EXAM: ULTRASOUND GUIDANCE FOR VASCULAR ACCESS  RIGHT INTERNAL JUGULAR PERMANENT HEMODIALYSIS CATHETER  Date:  11/13/201511/13/2015 1:40 pm  Radiologist:  M. Ruel Favorsrevor Shick, MD  Guidance:  Ultrasound and fluoroscopic  FLUOROSCOPY TIME:  30 seconds  MEDICATIONS AND MEDICAL HISTORY: 2 mg Versed, 100 mcg fentanyl  ANESTHESIA/SEDATION: 18 min  CONTRAST:  None.  COMPLICATIONS: None immediate  PROCEDURE: Informed consent was obtained from the patient following explanation of the procedure, risks, benefits and alternatives. The patient understands, agrees and consents for the procedure. All questions were addressed. A time out was performed.  Maximal barrier sterile technique utilized including caps, mask, sterile gowns, sterile gloves, large sterile drape, hand hygiene, and 2% chlorhexidine scrub.  Under sterile conditions and local anesthesia, right internal jugular micropuncture venous access was performed with ultrasound. Images were obtained for documentation. A guide wire was inserted followed by a transitional dilator. Next, a 0.035 guidewire was advanced into the IVC with a 5-French catheter. Measurements were obtained from the right venotomy site to the proximal right atrium. In the right infraclavicular chest, a subcutaneous tunnel was created under sterile conditions and local anesthesia. 1% lidocaine with epinephrine was utilized for this. The 23 cm tip to cuff catheter was tunneled subcutaneously to the venotomy site and inserted into the SVC/RA junction through a valved peel-away sheath. Position was confirmed with fluoroscopy. Images were obtained for documentation. Blood was aspirated from the catheter followed by saline and heparin flushes. The appropriate volume and strength of heparin was instilled in  each lumen. Caps were applied. The catheter was secured at the tunnel site with Gelfoam and a pursestring suture. The venotomy site was closed with subcuticular Vicryl suture. Dermabond was applied to the small right neck incision. A dry sterile dressing was applied. The catheter is ready for use. No immediate complications.  IMPRESSION: Ultrasound and fluoroscopically guided right internal jugular tunneled hemodialysis catheter (23 cm tip to cuff Palindrome catheter).   Electronically Signed   By: Ruel Favorsrevor  Shick M.D.   On: 06/22/2014 13:58   Ir [place] Guide Vasc Access Right  06/22/2014   CLINICAL DATA:  End-stage renal disease needing dialysis  EXAM: ULTRASOUND GUIDANCE FOR VASCULAR ACCESS  RIGHT INTERNAL JUGULAR PERMANENT HEMODIALYSIS CATHETER  Date:  11/13/201511/13/2015 1:40 pm  Radiologist:  M. Ruel Favors, MD  Guidance:  Ultrasound and fluoroscopic  FLUOROSCOPY TIME:  30 seconds  MEDICATIONS AND MEDICAL HISTORY: 2 mg Versed, 100 mcg fentanyl  ANESTHESIA/SEDATION: 18 min  CONTRAST:  None.  COMPLICATIONS: None immediate  PROCEDURE: Informed consent was obtained from the patient following explanation of the procedure, risks, benefits and alternatives. The patient understands, agrees and consents for the procedure. All questions were addressed. A time out was performed.  Maximal barrier sterile technique utilized including caps, mask, sterile gowns, sterile gloves, large sterile drape, hand hygiene, and 2% chlorhexidine scrub.  Under sterile conditions and local anesthesia, right internal jugular micropuncture venous access was performed with ultrasound. Images were obtained for documentation. A guide wire was inserted followed by a transitional dilator. Next, a 0.035 guidewire was advanced into the IVC with a 5-French catheter. Measurements were obtained from the right venotomy site to the proximal right atrium. In the right infraclavicular chest, a subcutaneous tunnel was created under sterile conditions and  local anesthesia. 1% lidocaine with epinephrine was utilized for this. The 23 cm tip to cuff catheter was tunneled subcutaneously to the venotomy site and inserted into the SVC/RA junction through a valved peel-away sheath. Position was confirmed with fluoroscopy. Images were obtained for documentation. Blood was aspirated from the catheter followed by saline and heparin flushes. The appropriate volume and strength of heparin was instilled in each lumen. Caps were applied. The catheter was secured at the tunnel site with Gelfoam and a pursestring suture. The venotomy site was closed with subcuticular Vicryl suture. Dermabond was applied to the small right neck incision. A dry sterile dressing was applied. The catheter is ready for use. No immediate complications.  IMPRESSION: Ultrasound and fluoroscopically guided right internal jugular tunneled hemodialysis catheter (23 cm tip to cuff Palindrome catheter).   Electronically Signed   By: Ruel Favors M.D.   On: 06/22/2014 13:58     Medications:     Scheduled Medications: . amiodarone  200 mg Oral Daily  . calcitRIOL  0.25 mcg Oral Daily  . darbepoetin (ARANESP) injection - DIALYSIS  60 mcg Intravenous Q Fri-HD  . feeding supplement (NEPRO CARB STEADY)  237 mL Oral BID BM  . insulin aspart  0-9 Units Subcutaneous TID WC  . kidney failure book   Does not apply Once  . pantoprazole  40 mg Oral Daily    Infusions: . sodium chloride 10 mL/hr at 06/20/14 0020  . heparin 2,100 Units/hr (06/24/14 0127)    PRN Medications: sodium chloride, acetaminophen, oxyCODONE   Assessment:   1. Acute respiratory failure  2. A/c systolic HF due to NICM EF < 15%  3. Acute on chronic renal failure stage IV  4. Hyperkalemia  5. Cardiogenic shock  6. DM2  7. Cocaine abuse in remission 3-4 months  8. H/o gout  9. Supratherapeutic INR  10. LV thrombus  11. PAF on amio  12. H/o VT 13. Fever 11/1   Plan/Discussion:     He is off dobutamine now.  Tolerated first two sessions of intermittent HD well. BP soft but stable with SBP 80-100s.   Volume up. Will get HD again tomorrow. Encouraged him to limit fluid intake.   Will need to restart coumadin when OK with VVS (graft placement pending). Continue heparin for now.   If bcx are repeat + will need TEE.   Hansel Devan,MD 11:06 AM

## 2014-06-24 NOTE — Plan of Care (Signed)
Problem: Phase II Progression Outcomes Goal: Dyspnea controlled at rest Outcome: Completed/Met Date Met:  06/24/14 Goal: Urinary output increased Outcome: Not Progressing Goal: Uremic symptoms decreased Outcome: Completed/Met Date Met:  06/24/14     

## 2014-06-24 NOTE — Plan of Care (Signed)
Problem: Consults Goal: ARF/New Onset CRF Patient Education See Patient Education Module for education specifics.  Outcome: Progressing Educating pt to use pt education channel for hemodialysis information.

## 2014-06-24 NOTE — Progress Notes (Signed)
ANTIBIOTIC CONSULT NOTE - FOLLOW UP  Pharmacy Consult for Vancomycin  Indication: Bacteremia  No Known Allergies   Labs:  Recent Labs  06/22/14 0010 06/23/14 0310 06/23/14 1355 06/24/14 0435  WBC 7.1  --  5.4  --   HGB 9.4*  --  9.4*  --   PLT 222  --  273  --   CREATININE 5.63* 5.17* 5.85* 4.54*    Recent Labs  06/23/14 0310 06/24/14 0435  VANCORANDOM 40.8 31.4     Assessment: Vancomycin currently on hold due to supratherapeutic level on 06/23/14. Today the random vanc level is 31.4, remains elevated. Pt received  HD treatment 11/13 (no supplemental dose was given) and 11/14, no vancomycin given. Vanc level remains above goal.  Plan is for another iHD session on tomorrow 11/16.   Goal of Therapy:  Pre-HD vancomycin level 15-25 mg/L  Plan:  Continue to hold vancomycin doses for now.  -Re-check random vancomycin level 11/16 pre HD.  -Re-dose vancomycin as appropriate   Noah Delaine, RPh Clinical Pharmacist Pager: [phone]  06/24/2014,2:20 PM

## 2014-06-24 NOTE — Progress Notes (Signed)
ANTICOAGULATION CONSULT NOTE - Follow Up Consult  Pharmacy Consult for Heparin  Indication: atrial fibrillation and apical thrombus  No Known Allergies  Patient Measurements: Height: 6' (182.9 cm) Weight: 220 lb (99.791 kg) IBW/kg (Calculated) : 77.6  Vital Signs: Temp: 98.4 F (36.9 C) (11/15 0942) Temp Source: Oral (11/15 0942) BP: 91/77 mmHg (11/15 0942) Pulse Rate: 81 (11/15 0942)  Labs:  Recent Labs  06/22/14 0010  06/23/14 0310 06/23/14 0903 06/23/14 1355 06/23/14 1700 06/24/14 0435  HGB 9.4*  --   --   --  9.4*  --   --   HCT 29.2*  --   --   --  29.2*  --   --   PLT 222  --   --   --  273  --   --   LABPROT 17.3*  --  15.7*  --   --   --   --   INR 1.40  --  1.23  --   --   --   --   HEPARINUNFRC 0.41  < >  --  0.30  --  0.19* 0.33  CREATININE 5.63*  --  5.17*  --  5.85*  --  4.54*  < > = values in this interval not displayed.  Estimated Creatinine Clearance: 19.8 mL/min (by C-G formula based on Cr of 4.54).    Assessment: Pt with hx afib/apical thrombus on coum pta. INR 4.7 on admit. Coumadin on hold with heparin bridge in place for HD graft placement -planned for Wed. INR down to 1.23 yesterday.  Heparn level 0.33 this morning on 2100 units/hr, remains therapeutic. Noted plan to restart coumadin after HD graft when ok with VVS. H/H yesterday low stable, pltc wnl.  No bleeding noted.  Goal of Therapy:  Heparin level 0.3-0.7 units/ml Monitor platelets by anticoagulation protocol: Yes   Plan:  Continue IV Heparin to 2100 units/hr. Daily HL and CBC   Noah Delaine, RPh Clinical Pharmacist Pager: [phone] 06/24/2014 1:58 PM

## 2014-06-25 LAB — CBC
HCT: 27.5 % — ABNORMAL LOW (ref 39.0–52.0)
HEMOGLOBIN: 8.8 g/dL — AB (ref 13.0–17.0)
MCH: 24.7 pg — AB (ref 26.0–34.0)
MCHC: 32 g/dL (ref 30.0–36.0)
MCV: 77.2 fL — ABNORMAL LOW (ref 78.0–100.0)
Platelets: 239 10*3/uL (ref 150–400)
RBC: 3.56 MIL/uL — ABNORMAL LOW (ref 4.22–5.81)
RDW: 20.7 % — ABNORMAL HIGH (ref 11.5–15.5)
WBC: 5.1 10*3/uL (ref 4.0–10.5)

## 2014-06-25 LAB — RENAL FUNCTION PANEL
ANION GAP: 15 (ref 5–15)
Albumin: 2.8 g/dL — ABNORMAL LOW (ref 3.5–5.2)
BUN: 32 mg/dL — ABNORMAL HIGH (ref 6–23)
CALCIUM: 8.9 mg/dL (ref 8.4–10.5)
CO2: 25 meq/L (ref 19–32)
Chloride: 91 mEq/L — ABNORMAL LOW (ref 96–112)
Creatinine, Ser: 5.19 mg/dL — ABNORMAL HIGH (ref 0.50–1.35)
GFR, EST AFRICAN AMERICAN: 12 mL/min — AB (ref >90)
GFR, EST NON AFRICAN AMERICAN: 11 mL/min — AB (ref >90)
GLUCOSE: 133 mg/dL — AB (ref 70–99)
POTASSIUM: 3.9 meq/L (ref 3.7–5.3)
Phosphorus: 3.3 mg/dL (ref 2.3–4.6)
Sodium: 131 mEq/L — ABNORMAL LOW (ref 137–147)

## 2014-06-25 LAB — HEPARIN LEVEL (UNFRACTIONATED)
Heparin Unfractionated: 0.98 IU/mL — ABNORMAL HIGH (ref 0.30–0.70)
Heparin Unfractionated: 0.65 IU/mL (ref 0.30–0.70)

## 2014-06-25 LAB — HEPATITIS B CORE ANTIBODY, TOTAL: HEP B C TOTAL AB: NONREACTIVE

## 2014-06-25 LAB — HEPATITIS B SURFACE ANTIBODY,QUALITATIVE: HEP B S AB: NEGATIVE

## 2014-06-25 LAB — GLUCOSE, CAPILLARY
GLUCOSE-CAPILLARY: 106 mg/dL — AB (ref 70–99)
Glucose-Capillary: 259 mg/dL — ABNORMAL HIGH (ref 70–99)
Glucose-Capillary: 168 mg/dL — ABNORMAL HIGH (ref 70–99)

## 2014-06-25 LAB — VANCOMYCIN, RANDOM
VANCOMYCIN RM: 24.3 ug/mL
Vancomycin Rm: 34.2 ug/mL

## 2014-06-25 MED ORDER — DOXERCALCIFEROL 4 MCG/2ML IV SOLN
INTRAVENOUS | Status: AC
  Administered 2014-06-25: 2 ug via INTRAVENOUS
  Filled 2014-06-25: qty 2

## 2014-06-25 MED ORDER — ALLOPURINOL 100 MG PO TABS
200.0000 mg | ORAL_TABLET | Freq: Every day | ORAL | Status: DC
  Administered 2014-06-26 – 2014-06-28 (×3): 200 mg via ORAL
  Filled 2014-06-25 (×3): qty 2

## 2014-06-25 MED ORDER — HEPARIN SODIUM (PORCINE) 1000 UNIT/ML DIALYSIS
20.0000 [IU]/kg | INTRAMUSCULAR | Status: DC | PRN
Start: 2014-06-25 — End: 2014-06-26

## 2014-06-25 MED ORDER — COLCHICINE 0.6 MG PO TABS
0.6000 mg | ORAL_TABLET | Freq: Once | ORAL | Status: AC
  Administered 2014-06-25: 0.6 mg via ORAL
  Filled 2014-06-25: qty 1

## 2014-06-25 NOTE — Progress Notes (Signed)
Patient ID: [name], male   DOB: 05-24-1951, 63 y.o.   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 Maintained on dobutamine 68mcg/kg/min.   Past medical h/o is notable for CKD with baseline Cr 3.0, cocaine abuse, DM2, AF on amio, VT. He has not been candidate for advanced therapies due to cocaine abuse (quit 4 months ago) and renal failure. Started on dobutamine. Moved to [place] to try to stay off cocaine.   On Oct 14 seen at [facility]. CR was 3.3 (on 04/16/2014 Cr was 1.6 at [facility] on discharge) and potassium was low. Kcl was increased. Over past week increased dyspnea and edema. NYHA IIIB symptoms at baseline.   10/30 Admitted with rep failure with wide complex bradycardia. K 6.8 and Cr 4.6. Intubated and CVVHD started. Extubated 11/1.   Bcx + for bacillus.  CVVHD stopped on 11/11 after 35 pound removal.  11/13 Dobutamine weaned off intermittent HD started  Has been off dobutamine since 11/13. Tolerated iHD again today. SBP 80-90s. Denis dizziness or SOB  F/u  bcx from 11/12 are negative to date.   Objective:   Weight Range:  Vital Signs:   Temp:  [97.5 F (36.4 C)-98.5 F (36.9 C)] 98.2 F (36.8 C) (11/16 1200) Pulse Rate:  [71-95] 95 (11/16 1200) Resp:  [12-23] 18 (11/16 1200) BP: (81-117)/(48-96) 81/61 mmHg (11/16 1200) SpO2:  [95 %-98 %] 98 % (11/16 1200) Weight:  [100.3 kg (221 lb 1.9 oz)-102.4 kg (225 lb 12 oz)] 100.3 kg (221 lb 1.9 oz) (11/16 1136) Last BM Date: 06/23/14  Weight change: Filed Weights   06/24/14 2052 06/25/14 0722 06/25/14 1136  Weight: 102.059 kg (225 lb) 102.4 kg (225 lb 12 oz) 100.3 kg (221 lb 1.9 oz)    Intake/Output:   Intake/Output Summary (Last 24 hours) at 06/25/14 1348 Last data filed at 06/25/14 1136  Gross per 24 hour  Intake   1274 ml  Output   2000 ml  Net   -726 ml     Physical Exam:  General: NAD  Lying in  bed HEENT: normal  Neck: supple. JVP jaw Carotids 2+ bilat; no bruits. No lymphadenopathy or thryomegaly appreciated.  Cor: PMI laterally displaced. Regular rate & rhythm. 2/6 MR +s3  Lungs: clear Abdomen: soft, nontender, nondistended. No hepatosplenomegaly. No bruits or masses. Good bowel sounds.  Extremities: no cyanosis, clubbing, rash, 1+ edema Neuro: alert & orientedx3, cranial nerves grossly intact. moves all 4 extremities w/o difficulty. Affect pleasant   Telemetry: SR 90s  Labs: Basic Metabolic Panel:  Recent Labs Lab 06/19/14 0153  06/20/14 0235  06/22/14 0010 06/23/14 0310 06/23/14 1355 06/24/14 0435 06/25/14 0640  NA 128*  < > 133*  < > 125* 129* 126* 134* 131*  K 4.2  < > 4.4  < > 5.3 4.4 4.5 4.0 3.9  CL 94*  < > 96  < > 90* 92* 88* 93* 91*  CO2 22  < > 24  < > 17* 19 21 26 25   GLUCOSE 139*  < > 213*  < > 147* 200* 233* 130* 133*  BUN 12  < > 12  < > 35* 31* 35* 24* 32*  CREATININE 2.05*  < > 2.21*  < > 5.63* 5.17* 5.85* 4.54* 5.19*  CALCIUM 8.5  < > 8.6  < > 9.0 8.9 8.8 8.8 8.9  MG 2.4  --  2.5  --   --   --   --   --   --   PHOS 2.8  < > 2.9  < > 4.5 4.3 4.2 3.2 3.3  < > = values in this interval not displayed.  Liver Function Tests:  Recent Labs Lab 06/22/14 0010 06/23/14 0310 06/23/14 1355 06/24/14 0435 06/25/14 0640  ALBUMIN 2.6* 2.6* 2.8* 2.9* 2.8*   No results for input(s): LIPASE, AMYLASE in the last 168 hours. No results for input(s): AMMONIA in the last 168 hours.  CBC:  Recent Labs Lab 06/20/14 0235 06/21/14 0236 06/22/14 0010 06/23/14 1355 06/25/14 0640  WBC 6.8 6.8 7.1 5.4 5.1  HGB 10.3* 9.1* 9.4* 9.4* 8.8*  HCT 32.4* 28.6* 29.2* 29.2* 27.5*  MCV 77.0* 76.1* 76.0* 75.3* 77.2*  PLT 184 214 222 273 239    Cardiac Enzymes: No results for input(s): CKTOTAL, CKMB, CKMBINDEX, TROPONINI in the last 168 hours.  BNP: BNP (last 3 results)  Recent Labs  06/08/14 1040 06/12/14 0450  PROBNP 11335.0* 6716.0*     Other  results:    Imaging: No results found.   Medications:     Scheduled Medications: . amiodarone  200 mg Oral Daily  . darbepoetin (ARANESP) injection - DIALYSIS  60 mcg Intravenous Q Fri-HD  . doxercalciferol  2 mcg Intravenous Q M,W,F-HD  . feeding supplement (NEPRO CARB STEADY)  237 mL Oral BID BM  . insulin aspart  0-9 Units Subcutaneous TID WC  . pantoprazole  40 mg Oral Daily    Infusions: . sodium chloride 10 mL/hr at 06/20/14 0020  . heparin 2,000 Units/hr (06/25/14 1144)    PRN Medications: sodium chloride, acetaminophen, heparin, oxyCODONE   Assessment:   1. Acute respiratory failure  2. A/c systolic HF due to NICM EF < 15%  3. Acute on chronic renal failure stage IV  4. Hyperkalemia  5. Cardiogenic shock  6. DM2  7. Cocaine abuse in remission 3-4 months  8. H/o gout  9. Supratherapeutic INR  10. LV thrombus  11. PAF on amio  12. H/o VT 13. Fever 11/1   Plan/Discussion:     He is off dobutamine now. Tolerating intermittent HD well. BP soft but stable with SBP 80-90s. Volume status remains elevated.   Will need to restart coumadin when OK with VVS (graft placement pending). Continue heparin for now.   If bcx are repeat + will need TEE.   [name] Helzer,MD 1:48 PM

## 2014-06-25 NOTE — Progress Notes (Signed)
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  Over past week increased dyspnea and edema.       PT Comments    Functional mobility limited this session by R knee pain, which pt attributes to gout. Was able to ambulate rather well despite pain, with IV pole for support. Will continue to follow. Will keep RW as equipment recommendations due to gout pain for now, however may be able to progress before d/c.   Follow Up Recommendations  Home health PT;Supervision - Intermittent     Equipment Recommendations  Rolling walker with 5" wheels    Recommendations for Other Services       Precautions / Restrictions Precautions Precautions: Fall Restrictions Weight Bearing Restrictions: No    Mobility  Bed Mobility Overal bed mobility: Modified Independent             General bed mobility comments: Increased time, no physical assist required.   Transfers Overall transfer level: Needs assistance Equipment used:  (IV pole once standing) Transfers: Sit to/from Stand Sit to Stand: Min guard         General transfer comment: Pt requires increased time to power-up to full stand. Pt guarding R knee and is obviously in pain to transition sit>stand, however does not require physical assistance.   Ambulation/Gait Ambulation/Gait assistance: Min guard Ambulation Distance (Feet): 30 Feet Assistive device:  (IV pole) Gait Pattern/deviations: Step-through pattern;Decreased stride length;Decreased weight shift to right Gait velocity: decreased Gait velocity interpretation: Below normal speed for age/gender General Gait Details: Pt limited during gait training due to pain in R knee. Pt  was able to ambulate to/from bathroom - in-room ambulation only at this time.    Stairs            Wheelchair Mobility    Modified Rankin (Stroke Patients Only)       Balance Overall balance assessment: Needs assistance Sitting-balance support: Feet supported;No upper extremity supported Sitting balance-Leahy Scale: Good     Standing balance support: Single extremity supported;During functional activity Standing balance-Leahy Scale: Fair Standing balance comment: Feel pt is safe for static standing, however any dynamic activity requires support. Pt leaning on sink for support to reach for paper towels after washing hands.                     Cognition Arousal/Alertness: Awake/alert Behavior During Therapy: WFL for tasks assessed/performed Overall Cognitive Status: Within Functional Limits for tasks assessed                      Exercises General Exercises - Lower Extremity Long Arc Quad: 5 reps (Limited with pain)    General Comments        Pertinent Vitals/Pain Pain Assessment: Faces Faces Pain Scale: Hurts little more Pain Location: R knee Pain Descriptors / Indicators: Other (Comment) (Gouty) Pain Intervention(s): Limited activity within patient's tolerance;Monitored during session;Other (comment) (RN notified, states gout medication is ordered)    Home Living                      Prior Function            PT Goals (  current goals can now be found in the care plan section) Acute Rehab PT Goals Patient Stated Goal: return home once stronger PT Goal Formulation: With patient Time For Goal Achievement: 07/04/14 Potential to Achieve Goals: Good Progress towards PT goals: Progressing toward goals    Frequency  Min 3X/week    PT Plan Current plan remains appropriate    Co-evaluation             End of Session Equipment Utilized During Treatment: Gait belt Activity Tolerance: Patient limited by pain Patient left: in  chair;with call bell/phone within reach     Time: 1429-1455 PT Time Calculation (min) (ACUTE ONLY): 26 min  Charges:  $Gait Training: 8-22 mins $Therapeutic Activity: 8-22 mins                    G Codes:      Conni SlipperKirkman, Mantaj Chamberlin 06/25/2014, 3:03 PM   Conni SlipperLaura Karessa Onorato, PT, DPT Acute Rehabilitation Services Pager: [phone]

## 2014-06-25 NOTE — Progress Notes (Signed)
NUTRITION FOLLOW UP  Intervention:    Continue Nepro Shake po BID, each supplement provides 425 kcal and 19 grams protein  Nutritional services ambassador to assist patient with meal options  Nutrition Dx:   Inadequate oral intake related to poor appetite as evidenced by 50% meal completion; Improved  Goal:   Intake to meet >/=90% of estimated nutrition needs; met  Monitor:   PO intake, labs, weight trend.  Assessment:   63 year old with end stage CHF with EF of 15% who presented with hyperkalemia, pulmonary edema, respiratory failure and renal failure. Presented with SOB, respiratory failure and severe anasarca.  CVVHD stopped 11/11. Intermittent HD started 11/13. Pt tolerated HD well. Pt reports his appetite has been improving. Meal completion has been 50-100%. Pt has also been drinking his Nepro shakes. Pt was encouraged to continue eating his food at meals and to drink his supplements.   Labs: Low sodium, chloride, GFR. High BUN and creatinine.   Height: Ht Readings from Last 1 Encounters:  06/19/14 6' (1.829 m)    Weight Status:   Wt Readings from Last 1 Encounters:  06/25/14 221 lb 1.9 oz (100.3 kg)   06/13/14 235 lb 7.2 oz (106.8 kg)   06/08/14 280 lb (127.007 kg)   Body mass index is 29.98 kg/(m^2).   Adjusted Body weight: 97.05 kg  Re-estimated needs:  Kcal: 2200-2400 Protein: 130-140 gm Fluid: 1.2 L/day  Skin: laceration on Mid Chest, +2 LE edema  Diet Order: Diet renal W/1278m fluid restriction   Intake/Output Summary (Last 24 hours) at 06/25/14 1239 Last data filed at 06/25/14 1136  Gross per 24 hour  Intake   1514 ml  Output   2000 ml  Net   -486 ml    Last BM: 11/14   Labs:   Recent Labs Lab 06/19/14 0153  06/20/14 0235  06/23/14 1355 06/24/14 0435 06/25/14 0640  NA 128*  < > 133*  < > 126* 134* 131*  K 4.2  < > 4.4  < > 4.5 4.0 3.9  CL 94*  < > 96  < > 88* 93* 91*  CO2 22  < > 24  < > 21 26 25   BUN 12  < > 12  < > 35* 24*  32*  CREATININE 2.05*  < > 2.21*  < > 5.85* 4.54* 5.19*  CALCIUM 8.5  < > 8.6  < > 8.8 8.8 8.9  MG 2.4  --  2.5  --   --   --   --   PHOS 2.8  < > 2.9  < > 4.2 3.2 3.3  GLUCOSE 139*  < > 213*  < > 233* 130* 133*  < > = values in this interval not displayed.  CBG (last 3)   Recent Labs  06/24/14 1630 06/24/14 2103 06/25/14 1156  GLUCAP 258* 189* 106*    Scheduled Meds: . amiodarone  200 mg Oral Daily  . darbepoetin (ARANESP) injection - DIALYSIS  60 mcg Intravenous Q Fri-HD  . doxercalciferol  2 mcg Intravenous Q M,W,F-HD  . feeding supplement (NEPRO CARB STEADY)  237 mL Oral BID BM  . insulin aspart  0-9 Units Subcutaneous TID WC  . pantoprazole  40 mg Oral Daily    Continuous Infusions: . sodium chloride 10 mL/hr at 06/20/14 0020  . heparin 2,000 Units/hr (06/25/14 1144)    SKallie Locks MS, RD, LDN Pager # [phone] hours/ weekend pager # [phone]

## 2014-06-25 NOTE — Progress Notes (Signed)
Patient ID: [name], male   DOB: 11-27-1950, 63 y.o.   MRN: [id number]  [facility] Progress Note   Assessment/ Plan:   1. End-stage renal disease: Appears to be a progression of chronic kidney disease stage IV following acute renal failure. Currently on hemodialysis 3 days a week but anticipate that he may need it increased to 4 days a week given his poor cardiac function. Unfortunately, not a candidate for heart transplant/pump therapies. Process initiated for outpatient hemodialysis unit placement and plans in place for permanent access on Wednesday (plan for hemodialysis again to allow unhindered surgery schedule on Wednesday). 2. CHF exacerbation-ejection fraction less than 15%. Previously on dobutamine that was discontinued on 11/13. Continue to support with ultrafiltration with hemodialysis. Unfortunately poor prognosis with inability to treat underlying cardiac dysfunction. 3. Anemia of chronic kidney disease/chronic disease: Hemoglobin stable on ESA, no overt losses. 4. History of atrial fibrillation: Currently on intravenous heparin and amiodarone-rate controlled 5. Metabolic bone disease: On Hectorol for PTH suppression. Phosphorus levels at goal-not on binders.   Subjective:   Reports to be feeling well, noticed that pedal edema increased yesterday   Objective:   BP 97/64 mmHg  Pulse 78  Temp(Src) 98 F (36.7 C) (Oral)  Resp 14  Ht 6' (1.829 m)  Wt 102.4 kg (225 lb 12 oz)  BMI 30.61 kg/m2  SpO2 96%  Physical Exam: BDZ:[id number] resting on dialysis HDQ:QIWLN regular in rate and rhythm, S1 and S2 normal Resp:decreased breath sounds over bases otherwise clear to auscultation LGX:QJJH, obese, nontender Ext:3+ lower extremity edema  Labs: BMET  Recent Labs Lab 06/20/14 0235 06/21/14 0236 06/22/14 0010 06/23/14 0310 06/23/14 1355 06/24/14 0435 06/25/14 0640  NA 133* 129* 125* 129* 126* 134* 131*  K 4.4 4.2 5.3 4.4 4.5 4.0 3.9  CL 96 93*  90* 92* 88* 93* 91*  CO2 24 22 17* 19 21 26 25   GLUCOSE 213* 133* 147* 200* 233* 130* 133*  BUN 12 23 35* 31* 35* 24* 32*  CREATININE 2.21* 3.76* 5.63* 5.17* 5.85* 4.54* 5.19*  CALCIUM 8.6 8.7 9.0 8.9 8.8 8.8 8.9  PHOS 2.9 3.6 4.5 4.3 4.2 3.2 3.3   CBC  Recent Labs Lab 06/21/14 0236 06/22/14 0010 06/23/14 1355 06/25/14 0640  WBC 6.8 7.1 5.4 5.1  HGB 9.1* 9.4* 9.4* 8.8*  HCT 28.6* 29.2* 29.2* 27.5*  MCV 76.1* 76.0* 75.3* 77.2*  PLT 214 222 273 239   Medications:    . amiodarone  200 mg Oral Daily  . darbepoetin (ARANESP) injection - DIALYSIS  60 mcg Intravenous Q Fri-HD  . doxercalciferol  2 mcg Intravenous Q M,W,F-HD  . feeding supplement (NEPRO CARB STEADY)  237 mL Oral BID BM  . insulin aspart  0-9 Units Subcutaneous TID WC  . pantoprazole  40 mg Oral Daily   Zetta Bills, MD 06/25/2014, 10:29 AM

## 2014-06-25 NOTE — Procedures (Signed)
Patient seen on Hemodialysis. QB 390, UF goal 2.5L Treatment adjusted as needed.  Zetta Bills MD [facility]. Office # [phone] Pager # [phone] 10:21 AM

## 2014-06-25 NOTE — Progress Notes (Signed)
PT Cancellation Note  Patient Details Name: Chans Ewert MRN: [id number] DOB: 02-Jan-1951   Cancelled Treatment:    Reason Eval/Treat Not Completed: Patient at procedure or test/unavailable. Pt off unit at this time. Will check back as schedule allows to complete PT eval.    Conni Slipper 06/25/2014, 10:28 AM   Conni Slipper, PT, DPT Acute Rehabilitation Services Pager: [phone]

## 2014-06-25 NOTE — Progress Notes (Signed)
ANTICOAGULATION CONSULT NOTE - Follow Up Consult  Pharmacy Consult for Heparin  Indication: atrial fibrillation and apical thrombus  No Known Allergies  Patient Measurements: Height: 6' (182.9 cm) Weight: 225 lb 12 oz (102.4 kg) IBW/kg (Calculated) : 77.6  Vital Signs: Temp: 98 F (36.7 C) (11/16 0722) Temp Source: Oral (11/16 0722) BP: 97/64 mmHg (11/16 1028) Pulse Rate: 78 (11/16 1028)  Labs:  Recent Labs  06/23/14 0310  06/23/14 1355 06/23/14 1700 06/24/14 0435 06/25/14 0500 06/25/14 0640  HGB  --   --  9.4*  --   --   --  8.8*  HCT  --   --  29.2*  --   --   --  27.5*  PLT  --   --  273  --   --   --  239  LABPROT 15.7*  --   --   --   --   --   --   INR 1.23  --   --   --   --   --   --   HEPARINUNFRC  --   < >  --  0.19* 0.33 0.98*  --   CREATININE 5.17*  --  5.85*  --  4.54*  --  5.19*  < > = values in this interval not displayed.  Estimated Creatinine Clearance: 17.6 mL/min (by C-G formula based on Cr of 5.19).    Assessment: Pt with hx afib/apical thrombus on coum pta. INR 4.7 on admit. Coumadin on hold with heparin bridge in place for HD graft placement -planned for Wed. INR down to 1.23 yesterday.  Heparn level 0.98 this am, drawn before HD   Noted plan to restart coumadin after HD graft when ok with VVS. Hg decreased to 8.8, pltc wnl.  No bleeding noted.  Goal of Therapy:  Heparin level 0.3-0.7 units/ml Monitor platelets by anticoagulation protocol: Yes   Plan:  Decrese IV Heparin to 2000 units/hr. Check HL later today  Thanks for allowing pharmacy to be a part of this patient's care.  Talbert Cage, PharmD Clinical Pharmacist, [phone] 06/25/2014 10:30 AM

## 2014-06-25 NOTE — Plan of Care (Signed)
Problem: Food- and Nutrition-Related Knowledge Deficit (NB-1.1) Goal: Nutrition education Formal process to instruct or train a patient/client in a skill or to impart knowledge to help patients/clients voluntarily manage or modify food choices and eating behavior to maintain or improve health. Outcome: Completed/Met Date Met:  06/25/14 Nutrition Education Note  RD consulted for Renal Education. Provided Choose-A-Meal Booklet and "Healthy Living with Kidney Disease" to patient. Reviewed food groups and provided written recommended serving sizes specifically determined for patient's current nutritional status.   Explained why diet restrictions are needed and provided lists of foods to limit/avoid that are high potassium, sodium, and phosphorus. Provided specific recommendations on safer alternatives of these foods. Strongly encouraged compliance of this diet.   Discussed importance of protein intake at each meal and snack. Provided examples of how to maximize protein intake throughout the day. Discussed need for fluid restriction with dialysis, and renal-friendly beverage options.Teach back method used.  Expect good compliance.  Body mass index is 29.98 kg/(m^2). Pt meets criteria for overweight based on current BMI.  Current diet order is renal diet with 1200 ml fluids, patient is consuming approximately 50-100% of meals at this time. Labs and medications reviewed.   Kallie Locks, MS, RD, LDN Pager # [phone] After hours/ weekend pager # [phone]

## 2014-06-26 LAB — RENAL FUNCTION PANEL
Albumin: 3.1 g/dL — ABNORMAL LOW (ref 3.5–5.2)
Anion gap: 16 — ABNORMAL HIGH (ref 5–15)
BUN: 29 mg/dL — AB (ref 6–23)
CALCIUM: 9.1 mg/dL (ref 8.4–10.5)
CO2: 24 mEq/L (ref 19–32)
Chloride: 90 mEq/L — ABNORMAL LOW (ref 96–112)
Creatinine, Ser: 4.81 mg/dL — ABNORMAL HIGH (ref 0.50–1.35)
GFR calc non Af Amer: 12 mL/min — ABNORMAL LOW (ref >90)
GFR, EST AFRICAN AMERICAN: 13 mL/min — AB (ref >90)
GLUCOSE: 117 mg/dL — AB (ref 70–99)
PHOSPHORUS: 3.5 mg/dL (ref 2.3–4.6)
Potassium: 4.5 mEq/L (ref 3.7–5.3)
Sodium: 130 mEq/L — ABNORMAL LOW (ref 137–147)

## 2014-06-26 LAB — CBC
HEMATOCRIT: 29.2 % — AB (ref 39.0–52.0)
HEMOGLOBIN: 9.2 g/dL — AB (ref 13.0–17.0)
MCH: 24.6 pg — AB (ref 26.0–34.0)
MCHC: 31.5 g/dL (ref 30.0–36.0)
MCV: 78.1 fL (ref 78.0–100.0)
Platelets: 268 10*3/uL (ref 150–400)
RBC: 3.74 MIL/uL — AB (ref 4.22–5.81)
RDW: 20.9 % — ABNORMAL HIGH (ref 11.5–15.5)
WBC: 6.6 10*3/uL (ref 4.0–10.5)

## 2014-06-26 LAB — GLUCOSE, CAPILLARY
Glucose-Capillary: 119 mg/dL — ABNORMAL HIGH (ref 70–99)
Glucose-Capillary: 146 mg/dL — ABNORMAL HIGH (ref 70–99)
Glucose-Capillary: 187 mg/dL — ABNORMAL HIGH (ref 70–99)

## 2014-06-26 LAB — HEPARIN LEVEL (UNFRACTIONATED): Heparin Unfractionated: 0.66 IU/mL (ref 0.30–0.70)

## 2014-06-26 MED ORDER — WHITE PETROLATUM GEL
Status: AC
  Filled 2014-06-26: qty 5

## 2014-06-26 MED ORDER — VANCOMYCIN HCL IN DEXTROSE 1-5 GM/200ML-% IV SOLN
1000.0000 mg | Freq: Once | INTRAVENOUS | Status: AC
  Administered 2014-06-26: 1000 mg via INTRAVENOUS
  Filled 2014-06-26: qty 200

## 2014-06-26 MED ORDER — WARFARIN SODIUM 4 MG PO TABS
4.0000 mg | ORAL_TABLET | Freq: Once | ORAL | Status: DC
  Filled 2014-06-26: qty 1

## 2014-06-26 MED ORDER — CEFAZOLIN SODIUM 1-5 GM-% IV SOLN
1.0000 g | INTRAVENOUS | Status: DC
  Filled 2014-06-26: qty 50

## 2014-06-26 MED ORDER — WARFARIN - PHARMACIST DOSING INPATIENT: Freq: Every day | Status: DC

## 2014-06-26 MED ORDER — CEFAZOLIN SODIUM-DEXTROSE 2-3 GM-% IV SOLR
2.0000 g | INTRAVENOUS | Status: AC
  Administered 2014-06-27: 2 g via INTRAVENOUS
  Filled 2014-06-26: qty 50

## 2014-06-26 NOTE — Progress Notes (Signed)
Patient ID: Trevor MaceKenneth Letarte, male   DOB: 30-Nov-1950, 63 y.o.   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 Maintained on dobutamine 775mcg/kg/min.   Past medical h/o is notable for CKD with baseline Cr 3.0, cocaine abuse, DM2, AF on amio, VT. He has not been candidate for advanced therapies due to cocaine abuse (quit 4 months ago) and renal failure. Started on dobutamine. [place].   On Oct 14 seen at [facility]. CR was 3.3 (on 04/16/2014 [facility]) and potassium was low. Kcl was increased. Over past week increased dyspnea and edema. NYHA IIIB symptoms at baseline.   10/30 Admitted with rep failure with wide complex bradycardia. K 6.8 and Cr 4.6. Intubated and CVVHD started. Extubated 11/1.   Bcx + for bacillus.  CVVHD stopped on 11/11 after 35 pound removal.  11/13 Dobutamine weaned off intermittent HD started  Has been off dobutamine since 11/13. Tolerated iHD again today with 3.7L off. SBP 90s. Denis dizziness or SOB.   F/u  bcx from 11/12 are negative to date.   Objective:   Weight Range:  Vital Signs:   Temp:  [97 F (36.1 C)-98.4 F (36.9 C)] 97 F (36.1 C) (11/17 0754) Pulse Rate:  [77-90] 77 (11/17 1130) Resp:  [15-18] 15 (11/17 0754) BP: (85-96)/(55-76) 95/65 mmHg (11/17 1130) SpO2:  [96 %-100 %] 100 % (11/17 0754) Weight:  [100.299 kg (221 lb 1.9 oz)-103.2 kg (227 lb 8.2 oz)] 103.2 kg (227 lb 8.2 oz) (11/17 0754) Last BM Date: 06/23/14  Weight change: Filed Weights   06/25/14 1136 06/25/14 2043 06/26/14 0754  Weight: 100.3 kg (221 lb 1.9 oz) 100.299 kg (221 lb 1.9 oz) 103.2 kg (227 lb 8.2 oz)    Intake/Output:   Intake/Output Summary (Last 24 hours) at 06/26/14 1200 Last data filed at 06/26/14 0425  Gross per 24 hour  Intake    520 ml  Output     50 ml  Net    470 ml     Physical Exam:   General: NAD  Lying in bed HEENT: normal  Neck: supple. JVP jaw Carotids 2+ bilat; no bruits. No lymphadenopathy or thryomegaly appreciated.  Cor: PMI laterally displaced. Regular rate & rhythm. 2/6 MR +s3  Lungs: clear Abdomen: soft, nontender, nondistended. No hepatosplenomegaly. No bruits or masses. Good bowel sounds.  Extremities: no cyanosis, clubbing, rash, 1+ edema Neuro: alert & orientedx3, cranial nerves grossly intact. moves all 4 extremities w/o difficulty. Affect pleasant   Telemetry: SR 90s  Labs: Basic Metabolic Panel:  Recent Labs Lab 06/20/14 0235  06/23/14 0310 06/23/14 1355 06/24/14 0435 06/25/14 0640 06/26/14 0801  NA 133*  < > 129* 126* 134* 131* 130*  K 4.4  < > 4.4 4.5 4.0 3.9 4.5  CL 96  < > 92* 88* 93* 91* 90*  CO2 24  < > 19 21 26 25 24   GLUCOSE 213*  < > 200* 233* 130* 133* 117*  BUN 12  < > 31* 35* 24* 32* 29*  CREATININE 2.21*  < > 5.17* 5.85* 4.54* 5.19* 4.81*  CALCIUM 8.6  < > 8.9 8.8 8.8 8.9 9.1  MG 2.5  --   --   --   --   --   --   PHOS 2.9  < > 4.3 4.2  3.2 3.3 3.5  < > = values in this interval not displayed.  Liver Function Tests:  Recent Labs Lab 06/23/14 0310 06/23/14 1355 06/24/14 0435 06/25/14 0640 06/26/14 0801  ALBUMIN 2.6* 2.8* 2.9* 2.8* 3.1*   No results for input(s): LIPASE, AMYLASE in the last 168 hours. No results for input(s): AMMONIA in the last 168 hours.  CBC:  Recent Labs Lab 06/21/14 0236 06/22/14 0010 06/23/14 1355 06/25/14 0640 06/26/14 0801  WBC 6.8 7.1 5.4 5.1 6.6  HGB 9.1* 9.4* 9.4* 8.8* 9.2*  HCT 28.6* 29.2* 29.2* 27.5* 29.2*  MCV 76.1* 76.0* 75.3* 77.2* 78.1  PLT 214 222 273 239 268    Cardiac Enzymes: No results for input(s): CKTOTAL, CKMB, CKMBINDEX, TROPONINI in the last 168 hours.  BNP: BNP (last 3 results)  Recent Labs  06/08/14 1040 06/12/14 0450  PROBNP 11335.0* 6716.0*     Other results:    Imaging: No results found.   Medications:     Scheduled  Medications: . allopurinol  200 mg Oral Daily  . amiodarone  200 mg Oral Daily  . darbepoetin (ARANESP) injection - DIALYSIS  60 mcg Intravenous Q Fri-HD  . doxercalciferol  2 mcg Intravenous Q M,W,F-HD  . feeding supplement (NEPRO CARB STEADY)  237 mL Oral BID BM  . insulin aspart  0-9 Units Subcutaneous TID WC  . pantoprazole  40 mg Oral Daily    Infusions: . sodium chloride 10 mL/hr at 06/20/14 0020  . heparin 2,000 Units/hr (06/26/14 0255)    PRN Medications: sodium chloride, acetaminophen, heparin, oxyCODONE   Assessment:   1. Acute respiratory failure  2. A/c systolic HF due to NICM EF < 15%  3. Acute on chronic renal failure stage IV  4. Hyperkalemia  5. Cardiogenic shock  6. DM2  7. Cocaine abuse in remission 3-4 months  8. H/o gout  9. Supratherapeutic INR  10. LV thrombus  11. PAF on amio  12. H/o VT   Plan/Discussion:     He is off dobutamine now. Tolerating intermittent HD well. BP soft but stable with SBP 80-90s. Volume status remains elevated (now 15 pounds above nadir weight). Suspect he made need a few extra HD sessions prior to d/c.   Graft placement tomorrow. Start coumadin tonight. Continue heparin for now.   Cardiac rehab to see.   Daniel Bensimhon,MD 12:00 PM

## 2014-06-26 NOTE — Progress Notes (Signed)
Patient ID: [name], male   DOB: 1951/04/03, 63 y.o.   MRN: [id number]  [facility] Progress Note   Assessment/ Plan:   1. End-stage renal disease: Progressive CKD with acute decompensation and now essentially progressed to ESRD. Plans for permanent access surgery tomorrow. Awaiting HD unit placement at [facility]. May need HD 4 days a week.  2. CHF exacerbation-ejection fraction <15%. Previously on dobutamine that was discontinued on 11/13. Continue to support with ultrafiltration with hemodialysis. Unfortunately poor prognosis with inability to treat underlying cardiac dysfunction. 3. Anemia of chronic kidney disease/chronic disease: Hemoglobin stable on ESA, no overt losses. 4. History of atrial fibrillation: Currently on intravenous heparin and amiodarone-rate controlled 5. Metabolic bone disease: On Hectorol for PTH suppression. Phosphorus levels at goal-not on binders.  Subjective:   No emerging problems overnight- states that he is comfortable   Objective:   BP 96/76 mmHg  Pulse 83  Temp(Src) 97 F (36.1 C) (Oral)  Resp 15  Ht 6' (1.829 m)  Wt 103.2 kg (227 lb 8.2 oz)  BMI 30.85 kg/m2  SpO2 100%  Physical Exam: [id number] on HD POL:IDCVU RRR, normal s1 and s2 Resp:Decreased basal BS, no rales/rhonchi DTH:YHOO, obese, NT, BS normal Ext:3+ LE edema- pitting  Labs: BMET  Recent Labs Lab 06/21/14 0236 06/22/14 0010 06/23/14 0310 06/23/14 1355 06/24/14 0435 06/25/14 0640 06/26/14 0801  NA 129* 125* 129* 126* 134* 131* 130*  K 4.2 5.3 4.4 4.5 4.0 3.9 4.5  CL 93* 90* 92* 88* 93* 91* 90*  CO2 22 17* 19 21 26 25 24   GLUCOSE 133* 147* 200* 233* 130* 133* 117*  BUN 23 35* 31* 35* 24* 32* 29*  CREATININE 3.76* 5.63* 5.17* 5.85* 4.54* 5.19* 4.81*  CALCIUM 8.7 9.0 8.9 8.8 8.8 8.9 9.1  PHOS 3.6 4.5 4.3 4.2 3.2 3.3 3.5   CBC  Recent Labs Lab 06/22/14 0010 06/23/14 1355 06/25/14 0640 06/26/14 0801  WBC 7.1 5.4 5.1 6.6  HGB 9.4* 9.4* 8.8* 9.2*   HCT 29.2* 29.2* 27.5* 29.2*  MCV 76.0* 75.3* 77.2* 78.1  PLT 222 273 239 268   Medications:    . allopurinol  200 mg Oral Daily  . amiodarone  200 mg Oral Daily  . darbepoetin (ARANESP) injection - DIALYSIS  60 mcg Intravenous Q Fri-HD  . doxercalciferol  2 mcg Intravenous Q M,W,F-HD  . feeding supplement (NEPRO CARB STEADY)  237 mL Oral BID BM  . insulin aspart  0-9 Units Subcutaneous TID WC  . pantoprazole  40 mg Oral Daily   Zetta Bills, MD 06/26/2014, 9:59 AM

## 2014-06-26 NOTE — Progress Notes (Deleted)
+--------+-------------+--------+------+------------+-------------+ Vein  Location   DiameterDepth ObservationsComments    +--------+-------------+--------+------+------------+-------------+ CephalicRight axilla 3.7 mm ------------------Patent                             Compressible  +--------+-------------+--------+------+------------+-------------+ CephalicProximal   3.1 mm ------------------Patent         right upper              Compressible      arm                          +--------+-------------+--------+------+------------+-------------+ CephalicMid right  4.5 mm ------------------Patent         upper arm               Compressible  +--------+-------------+--------+------+------------+-------------+ CephalicAbove right 4.8 mm ------Branch   Patent         AC                   Compressible  +--------+-------------+--------+------+------------+-------------+ CephalicRight AC   3.6 mm ------------------Patent                             Compressible  +--------+-------------+--------+------+------------+-------------+ CephalicBelow right 3.5 mm ------Tortuous  Patent         AC                   Compressible  +--------+-------------+--------+------+------------+-------------+ CephalicMid right  3.5 mm ------------------Patent         forearm                Compressible  +--------+-------------+--------+------+------------+-------------+ CephalicRight wrist --------------------------IV       +--------+-------------+--------+------+------------+-------------+ Basilic Origin    3.4 mm 11.6 ------------Patent                     mm        Compressible  +--------+-------------+--------+------+------------+-------------+ Basilic Proximal   4.3 mm 14.4 ------------Patent         right upper     mm        Compressible      arm                          +--------+-------------+--------+------+------------+-------------+ Basilic Mid right  4.2 mm 17.4 ------------Patent         upper arm      mm        Compressible  +--------+-------------+--------+------+------------+-------------+ Basilic Above right 3.8 mm 12.1 Branch   Patent         AC          mm        Compressible  +--------+-------------+--------+------+------------+-------------+ Basilic Right AC   3.7 mm 8.8 mm------------Patent                             Compressible  +--------+-------------+--------+------+------------+-------------+ Basilic Below right 3.7 mm 4.9 mm------------Patent         AC                   Compressible  +--------+-------------+--------+------+------------+-------------+ CephalicLeft axilla --------------------------Too small to                          measure.    +--------+-------------+--------+------+------------+-------------+ CephalicProximal left--------------------------Too small to  upper arm               measure.    +--------+-------------+--------+------+------------+-------------+ CephalicMid left   1.7 mm ------------------Patent         upper arm               Compressible  +--------+-------------+--------+------+------------+-------------+ CephalicAbove left AC1.1 mm ------------------Patent                              Compressible  +--------+-------------+--------+------+------------+-------------+ CephalicLeft AC   3.1 mm ------Branch   Patent                             Compressible  +--------+-------------+--------+------+------------+-------------+ CephalicBelow left AC2.2 mm ------------------Patent                             Compressible  +--------+-------------+--------+------+------------+-------------+ CephalicMid left   2.7 mm ------Branch   Patent         forearm                Compressible  +--------+-------------+--------+------+------------+-------------+ CephalicLeft wrist  2 mm  ------------------Patent                             Compressible  +--------+-------------+--------+------+------------+-------------+ Basilic Origin    7.5 mm 20 mm ------------Patent                             Compressible  +--------+-------------+--------+------+------------+-------------+ Basilic Mid left   5.4 mm 20 mm ------------Patent         upper arm               Compressible  +--------+-------------+--------+------+------------+-------------+ Basilic Above left AC4.7 mm 14.6 ------------Patent                    mm        Compressible  +--------+-------------+--------+------+------------+-------------+ Basilic Left AC   3.8 mm 7 mm ------------Patent                             Compressible  +--------+-------------+--------+------+------------+-------------+ Basilic Below left AC4.3 mm 5 mm ------------Patent                             Compressible   +--------+-------------+--------+------+------------+-------------+   Vein mapping completed He is right hand dominant.  We will plan Left BC fistula creation by dr. Hart Rochester.  COLLINS, EMMA MAUREEN PA-C

## 2014-06-26 NOTE — Procedures (Signed)
Patient seen on Hemodialysis. QB 375, UF goal 3.6L Treatment adjusted as needed.  Zetta Bills MD [facility]. Office # [phone] Pager # [phone] 10:05 AM

## 2014-06-26 NOTE — Consult Note (Signed)
Vein mapping reviewed.  Despite being right handed his cephalic vein on the left arm is inadequate for fistula.  Right upper arm cephalic vein is of good quality.  Forearm vein was not mapped since he had in IV in this area at time of ultrasound  Discussed with pt Right arm AV fistula.  Dr Hart Rochester will make decision on lower or upper in OR tomorrow  NPO p midnight  Consent for Right arm AVF D/c heparin on call to OR Cefuroxime on call to OR  Risks benefits procedure details discussed with pt including reason for using right arm, non maturation of fistula, bleeding infection steal.  He agrees to proceed  Fabienne Bruns, MD [facility] Office: [phone] Pager: [phone]

## 2014-06-26 NOTE — Progress Notes (Addendum)
ANTICOAGULATION CONSULT NOTE - Follow Up Consult  Pharmacy Consult for heparin; vancomycin  Indication: atrial fibrillation and apical thrombus; bacillus cereus bacteremia  No Known Allergies  Patient Measurements: Height: 6' (182.9 cm) Weight: 220 lb 7.4 oz (100 kg) (standing) IBW/kg (Calculated) : 77.6 Heparin Dosing Weight: 98 kg  Vital Signs: Temp: 98.1 F (36.7 C) (11/17 1246) Temp Source: Oral (11/17 1246) BP: 90/44 mmHg (11/17 1246) Pulse Rate: 81 (11/17 1246)  Labs:  Recent Labs  06/24/14 0435 06/25/14 0500 06/25/14 0640 06/25/14 2157 06/26/14 0801 06/26/14 0820  HGB  --   --  8.8*  --  9.2*  --   HCT  --   --  27.5*  --  29.2*  --   PLT  --   --  239  --  268  --   HEPARINUNFRC 0.33 0.98*  --  0.65  --  0.66  CREATININE 4.54*  --  5.19*  --  4.81*  --     Estimated Creatinine Clearance: 18.8 mL/min (by C-G formula based on Cr of 4.81).   Assessment: Patient is a 63 y.o M on coumadin PTA for hx afib and apical thrombus. He's currently being bridged with heparin while coumadin is on hold in anticipation for right arm AVF placement on 11/18.  Heparin level is at goal with 0.66.  CBC remains stable, no bleeding documented.  He has been transitioned over from CRRT to IHD and currently on vancomycin  Day #7/14 for bacillus cereus bacteremia.  He received HD yesterday (4hrs, BFR~400) and again today (4hrs, BFR~400).  11/1 Vanc>> 11/1 Zosyn>>11/3  11/4 VT 12.7 mcg/ml on 1250mg  IV q24h 11/8 VT 21.6 on 1500 q24h 11/11 - CRRT stopped, received a dose just prior to stopping CRRT and another dose on 11/12. 11/14 random Vanc level 40.8 - holding Vanc doses 11/15 VR 31.4- cont to hold vanc 11/16 VR 34.2  11/16 VR @ 1810 (post HD)= 24.3  Goal of Therapy:  Heparin level 0.3-0.7 units/ml; pre-HD vancomycin level 15-25 Monitor platelets by anticoagulation protocol: Yes   Plan:  1) vancomycin 1gm IV x1 now 2) f/u with HD schedule and re-dose based on schedule 3)  continue heparin drip at 2000 units/hr  Derric Dealmeida P 06/26/2014,1:55 PM  Adden: Per Dr. Prescott Gum note "Start coumadin tonight." INR 1.23 on 11/14.  Will give coumadin 4mg  PO x1 tonight.

## 2014-06-27 ENCOUNTER — Inpatient Hospital Stay (HOSPITAL_COMMUNITY): Payer: Medicare HMO | Admitting: Certified Registered Nurse Anesthetist

## 2014-06-27 ENCOUNTER — Encounter (HOSPITAL_COMMUNITY): Payer: Self-pay | Admitting: Certified Registered Nurse Anesthetist

## 2014-06-27 ENCOUNTER — Encounter (HOSPITAL_COMMUNITY)
Admission: EM | Disposition: A | Discharge status: Home / Self Care | Payer: Self-pay | Source: Home / Self Care | Attending: Internal Medicine

## 2014-06-27 HISTORY — PX: AV FISTULA PLACEMENT: SHX1204

## 2014-06-27 LAB — GLUCOSE, CAPILLARY
GLUCOSE-CAPILLARY: 131 mg/dL — AB (ref 70–99)
GLUCOSE-CAPILLARY: 124 mg/dL — AB (ref 70–99)
GLUCOSE-CAPILLARY: 122 mg/dL — AB (ref 70–99)
Glucose-Capillary: 127 mg/dL — ABNORMAL HIGH (ref 70–99)
Glucose-Capillary: 211 mg/dL — ABNORMAL HIGH (ref 70–99)

## 2014-06-27 LAB — BASIC METABOLIC PANEL
ANION GAP: 15 (ref 5–15)
BUN: 24 mg/dL — ABNORMAL HIGH (ref 6–23)
CALCIUM: 9.1 mg/dL (ref 8.4–10.5)
CO2: 22 mEq/L (ref 19–32)
Chloride: 91 mEq/L — ABNORMAL LOW (ref 96–112)
Creatinine, Ser: 4.28 mg/dL — ABNORMAL HIGH (ref 0.50–1.35)
GFR calc Af Amer: 15 mL/min — ABNORMAL LOW (ref >90)
GFR, EST NON AFRICAN AMERICAN: 13 mL/min — AB (ref >90)
Glucose, Bld: 131 mg/dL — ABNORMAL HIGH (ref 70–99)
Potassium: 4.5 mEq/L (ref 3.7–5.3)
SODIUM: 128 meq/L — AB (ref 137–147)

## 2014-06-27 LAB — CBC
HCT: 28.9 % — ABNORMAL LOW (ref 39.0–52.0)
Hemoglobin: 9.2 g/dL — ABNORMAL LOW (ref 13.0–17.0)
MCH: 24.7 pg — ABNORMAL LOW (ref 26.0–34.0)
MCHC: 31.8 g/dL (ref 30.0–36.0)
MCV: 77.7 fL — AB (ref 78.0–100.0)
PLATELETS: 252 10*3/uL (ref 150–400)
RBC: 3.72 MIL/uL — ABNORMAL LOW (ref 4.22–5.81)
RDW: 20.8 % — AB (ref 11.5–15.5)
WBC: 6.2 10*3/uL (ref 4.0–10.5)

## 2014-06-27 LAB — SURGICAL PCR SCREEN
MRSA, PCR: NEGATIVE
Staphylococcus aureus: NEGATIVE

## 2014-06-27 LAB — PROTIME-INR
INR: 1.54 — AB (ref 0.00–1.49)
Prothrombin Time: 18.6 seconds — ABNORMAL HIGH (ref 11.6–15.2)

## 2014-06-27 LAB — CULTURE, BLOOD (ROUTINE X 2)
Culture: NO GROWTH
Culture: NO GROWTH

## 2014-06-27 LAB — HEPARIN LEVEL (UNFRACTIONATED): Heparin Unfractionated: 0.42 IU/mL (ref 0.30–0.70)

## 2014-06-27 SURGERY — ARTERIOVENOUS (AV) FISTULA CREATION
Anesthesia: Monitor Anesthesia Care | Site: Arm Lower | Laterality: Right

## 2014-06-27 MED ORDER — 0.9 % SODIUM CHLORIDE (POUR BTL) OPTIME
TOPICAL | Status: DC | PRN
  Administered 2014-06-27: 1000 mL

## 2014-06-27 MED ORDER — PROPOFOL INFUSION 10 MG/ML OPTIME
INTRAVENOUS | Status: DC | PRN
  Administered 2014-06-27: 50 ug/kg/min via INTRAVENOUS

## 2014-06-27 MED ORDER — HEPARIN (PORCINE) IN NACL 100-0.45 UNIT/ML-% IJ SOLN
2000.0000 [IU]/h | INTRAMUSCULAR | Status: DC
  Administered 2014-06-27 – 2014-06-28 (×3): 2000 [IU]/h via INTRAVENOUS
  Filled 2014-06-27 (×5): qty 250

## 2014-06-27 MED ORDER — DOXERCALCIFEROL 4 MCG/2ML IV SOLN
INTRAVENOUS | Status: AC
  Filled 2014-06-27: qty 2

## 2014-06-27 MED ORDER — FENTANYL CITRATE 0.05 MG/ML IJ SOLN
INTRAMUSCULAR | Status: AC
  Filled 2014-06-27: qty 5

## 2014-06-27 MED ORDER — PROPOFOL 10 MG/ML IV BOLUS
INTRAVENOUS | Status: AC
  Filled 2014-06-27: qty 20

## 2014-06-27 MED ORDER — EPHEDRINE SULFATE 50 MG/ML IJ SOLN
INTRAMUSCULAR | Status: AC
  Filled 2014-06-27: qty 1

## 2014-06-27 MED ORDER — ONDANSETRON HCL 4 MG/2ML IJ SOLN
INTRAMUSCULAR | Status: AC
  Filled 2014-06-27: qty 2

## 2014-06-27 MED ORDER — SUCCINYLCHOLINE CHLORIDE 20 MG/ML IJ SOLN
INTRAMUSCULAR | Status: AC
Start: 2014-06-27 — End: 2014-06-27
  Filled 2014-06-27: qty 1

## 2014-06-27 MED ORDER — VANCOMYCIN HCL IN DEXTROSE 1-5 GM/200ML-% IV SOLN
1000.0000 mg | Freq: Once | INTRAVENOUS | Status: AC
  Administered 2014-06-27: 1000 mg via INTRAVENOUS
  Filled 2014-06-27 (×2): qty 200

## 2014-06-27 MED ORDER — SODIUM CHLORIDE 0.9 % IV SOLN
INTRAVENOUS | Status: DC | PRN
  Administered 2014-06-27: 08:00:00 via INTRAVENOUS

## 2014-06-27 MED ORDER — SODIUM CHLORIDE 0.9 % IJ SOLN
INTRAMUSCULAR | Status: AC
  Filled 2014-06-27: qty 10

## 2014-06-27 MED ORDER — SODIUM CHLORIDE 0.9 % IR SOLN
Status: DC | PRN
  Administered 2014-06-27: 500 mL

## 2014-06-27 MED ORDER — LIDOCAINE HCL (CARDIAC) 20 MG/ML IV SOLN
INTRAVENOUS | Status: AC
  Filled 2014-06-27: qty 5

## 2014-06-27 MED ORDER — LIDOCAINE-EPINEPHRINE (PF) 1 %-1:200000 IJ SOLN
INTRAMUSCULAR | Status: AC
  Filled 2014-06-27: qty 10

## 2014-06-27 MED ORDER — MIDAZOLAM HCL 2 MG/2ML IJ SOLN
INTRAMUSCULAR | Status: AC
  Filled 2014-06-27: qty 2

## 2014-06-27 MED ORDER — PHENYLEPHRINE 40 MCG/ML (10ML) SYRINGE FOR IV PUSH (FOR BLOOD PRESSURE SUPPORT)
PREFILLED_SYRINGE | INTRAVENOUS | Status: AC
  Filled 2014-06-27: qty 10

## 2014-06-27 MED ORDER — MIDAZOLAM HCL 5 MG/5ML IJ SOLN
INTRAMUSCULAR | Status: DC | PRN
  Administered 2014-06-27: 2 mg via INTRAVENOUS

## 2014-06-27 MED ORDER — LIDOCAINE-EPINEPHRINE (PF) 1 %-1:200000 IJ SOLN
INTRAMUSCULAR | Status: DC | PRN
  Administered 2014-06-27: 25 mL

## 2014-06-27 SURGICAL SUPPLY — 30 items
ARMBAND PINK RESTRICT EXTREMIT (MISCELLANEOUS) ×2 IMPLANT
BLADE SURG 10 STRL SS (BLADE) ×2 IMPLANT
CANISTER SUCTION 2500CC (MISCELLANEOUS) ×2 IMPLANT
CLIP TI MEDIUM 6 (CLIP) ×2 IMPLANT
CLIP TI WIDE RED SMALL 6 (CLIP) ×2 IMPLANT
COVER PROBE W GEL 5X96 (DRAPES) ×2 IMPLANT
COVER SURGICAL LIGHT HANDLE (MISCELLANEOUS) ×2 IMPLANT
DERMABOND ADVANCED (GAUZE/BANDAGES/DRESSINGS) ×1
DERMABOND ADVANCED .7 DNX12 (GAUZE/BANDAGES/DRESSINGS) ×1 IMPLANT
DRAIN PENROSE 1/2X12 LTX STRL (WOUND CARE) ×2 IMPLANT
DRAIN PENROSE 1/4X12 LTX STRL (WOUND CARE) IMPLANT
ELECT REM PT RETURN 9FT ADLT (ELECTROSURGICAL) ×2
ELECTRODE REM PT RTRN 9FT ADLT (ELECTROSURGICAL) ×1 IMPLANT
GEL ULTRASOUND 20GR AQUASONIC (MISCELLANEOUS) IMPLANT
GLOVE BIOGEL PI IND STRL 7.5 (GLOVE) ×1 IMPLANT
GLOVE BIOGEL PI INDICATOR 7.5 (GLOVE) ×1
GLOVE SS BIOGEL STRL SZ 7 (GLOVE) ×2 IMPLANT
GLOVE SUPERSENSE BIOGEL SZ 7 (GLOVE) ×2
GOWN STRL REUS W/ TWL LRG LVL3 (GOWN DISPOSABLE) ×3 IMPLANT
GOWN STRL REUS W/TWL LRG LVL3 (GOWN DISPOSABLE) ×3
KIT BASIN OR (CUSTOM PROCEDURE TRAY) ×2 IMPLANT
KIT ROOM TURNOVER OR (KITS) ×2 IMPLANT
NS IRRIG 1000ML POUR BTL (IV SOLUTION) ×2 IMPLANT
PACK CV ACCESS (CUSTOM PROCEDURE TRAY) ×2 IMPLANT
PAD ARMBOARD 7.5X6 YLW CONV (MISCELLANEOUS) ×4 IMPLANT
SUT PROLENE 6 0 BV (SUTURE) ×2 IMPLANT
SUT VIC AB 3-0 SH 27 (SUTURE) ×1
SUT VIC AB 3-0 SH 27X BRD (SUTURE) ×1 IMPLANT
UNDERPAD 30X30 INCONTINENT (UNDERPADS AND DIAPERS) ×2 IMPLANT
WATER STERILE IRR 1000ML POUR (IV SOLUTION) ×2 IMPLANT

## 2014-06-27 NOTE — Op Note (Signed)
OPERATIVE REPORT  Date of Surgery: 06/08/2014 - 06/27/2014  Surgeon: Josephina Gip, MD  Assistant: Lianne Cure PA  Pre-op Diagnosis: End Stage Renal Disease N18.6  Post-op Diagnosis: End Stage Renal Disease N18.6  Procedure: Procedure(s): Creation right radial-cephalic AV fistula  Anesthesia: Mac  EBL: Minimal  Complications: None  Procedure Details: The patient was taken the operating replaced in the supine position at which time the right upper extremity was prepped with Betadine scrub and solution draped in a routine sterile manner. The cephalic vein was is imaged using B-mode ultrasound-SonoSite. Cephalic vein appeared adequate from the wrist to the antecubital area slightly larger in the upper arm. Patient had a good radial pulse and it was decided to attempt radial cephalic fistula initially. After infiltration with 1% Xylocaine with epinephrine longitudinal incision was made proximal to the wrist between the radial artery and cephalic vein. Cephalic vein was dissected free. Branches were ligated 3 and 4-0 silk ties and divided and ligated distally transected gently dilated with heparinized saline. It was approximately 3 mm in size. Brachial artery was then dissected free and circled with Vesseloops. It was a 2-1/2-3 mm vessel. Artery was occluded proximally and distally am 15 blade extended with Potts scissors. There was excellent inflow. The vein was carefully spatulated and anastomosed inside with 6-0 Prolene. Vessel loops were released there was a good pulse in the artery and there was Doppler flow in the fistula audible up to the antecubital area. Also image this with the son aside in the flow was present up to the antecubital area. He did not have a strongly palpable thrill however. Adequate hemostasis was achieved and the wound closed in layers with Vicryl in a subcuticular fashion sterile dressing applied patient taken to the recovery room in stable condition   Josephina Gip,  MD 06/27/2014 10:32 AM

## 2014-06-27 NOTE — Progress Notes (Addendum)
Patient ID: [name], male   DOB: 20-May-1951, 63 y.o.   MRN: [id number]   [facility] Progress Note   Assessment/ Plan:   1. End-stage renal disease: Progressive CKD with acute decompensation and now essentially progressed to ESRD. Status post permanent access placement earlier today. Accepted to start dialysis as an outpatient-[facility] (Monday/Wednesday/Friday schedule). I informed the patient that he may in fact need 4 days of dialysis a week and reeducated him of the importance of abstinence from cocaine. The vascular surgery PA is on her way to re-assess the patient's fistula as the floor RN could not doppler it. He will be discharged after a working permanent access is in place. 2. CHF exacerbation-ejection fraction <15%. Previously on dobutamine that was discontinued on 11/13. Continue to support with ultrafiltration with hemodialysis. Unfortunately poor prognosis with inability to treat underlying cardiac dysfunction. 3. Anemia of chronic kidney disease/chronic disease: Hemoglobin stable on ESA, no overt losses. 4. History of atrial fibrillation: Currently on intravenous heparin and amiodarone-rate controlled 5. Metabolic bone disease: On Hectorol for PTH suppression. Phosphorus levels at goal-not on binders.  Subjective:   The patient has just returned from creation of a right radiocephalic AV fistula and states that he is hungry and wants to eat.   Objective:   BP 86/60 mmHg  Pulse 73  Temp(Src) 97.5 F (36.4 C) (Oral)  Resp 16  Ht 6' (1.829 m)  Wt 100.001 kg (220 lb 7.4 oz)  BMI 29.89 kg/m2  SpO2 100%  Physical Exam: FXT:KWIOXBDZHGD resting in bed JME:QASTM regular in rate and rhythm, S1 and S2 normal Resp:clear to auscultation-no distinct rales/rhonchi HDQ:QIWL, flat, nontender Ext:3+ lower extremity edema. Status post right radiocephalic fistula- faint bruit, no thrill  Labs: BMET  Recent Labs Lab 06/21/14 0236 06/22/14 0010  06/23/14 0310 06/23/14 1355 06/24/14 0435 06/25/14 0640 06/26/14 0801 06/27/14 0617  NA 129* 125* 129* 126* 134* 131* 130* 128*  K 4.2 5.3 4.4 4.5 4.0 3.9 4.5 4.5  CL 93* 90* 92* 88* 93* 91* 90* 91*  CO2 22 17* 19 21 26 25 24 22   GLUCOSE 133* 147* 200* 233* 130* 133* 117* 131*  BUN 23 35* 31* 35* 24* 32* 29* 24*  CREATININE 3.76* 5.63* 5.17* 5.85* 4.54* 5.19* 4.81* 4.28*  CALCIUM 8.7 9.0 8.9 8.8 8.8 8.9 9.1 9.1  PHOS 3.6 4.5 4.3 4.2 3.2 3.3 3.5  --    CBC  Recent Labs Lab 06/23/14 1355 06/25/14 0640 06/26/14 0801 06/27/14 0617  WBC 5.4 5.1 6.6 6.2  HGB 9.4* 8.8* 9.2* 9.2*  HCT 29.2* 27.5* 29.2* 28.9*  MCV 75.3* 77.2* 78.1 77.7*  PLT 273 239 268 252    @IMGRELPRIORS @ Medications:    . allopurinol  200 mg Oral Daily  . amiodarone  200 mg Oral Daily  . darbepoetin (ARANESP) injection - DIALYSIS  60 mcg Intravenous Q Fri-HD  . doxercalciferol  2 mcg Intravenous Q M,W,F-HD  . feeding supplement (NEPRO CARB STEADY)  237 mL Oral BID BM  . insulin aspart  0-9 Units Subcutaneous TID WC  . pantoprazole  40 mg Oral Daily  . warfarin  4 mg Oral ONCE-1800  . Warfarin - Pharmacist Dosing Inpatient   Does not apply N9892   Zetta Bills, MD 06/27/2014, 10:58 AM

## 2014-06-27 NOTE — Progress Notes (Addendum)
         I was called to the patients room.  The nurse was unable to palpate or doppler the right fore arm AV fustula.  I was able to doppler the thrill and right radial pulse.  He has AROM of the right hand.    COLLINS, EMMA MAUREEN Fistula is patent now but unsure about long-term likelihood of success If this occludes she will need conversion to brachiocephalic fistula right arm

## 2014-06-27 NOTE — Progress Notes (Signed)
PT Cancellation Note  Patient Details Name: [name] MRN: [id number] DOB: 11/07/1950   Cancelled Treatment:  Pt for R AV fistula this morning. Will hold PT today and follow up tomorrow for treatment session.    Conni Slipper 06/27/2014, 9:30 AM   Conni Slipper, PT, DPT Acute Rehabilitation Services Pager: [phone]

## 2014-06-27 NOTE — Progress Notes (Signed)
ANTICOAGULATION CONSULT NOTE - Follow Up Consult  Pharmacy Consult for heparin Indication: atrial fibrillation and apical thrombus  No Known Allergies  Patient Measurements: Height: 6' (182.9 cm) Weight: 220 lb 7.4 oz (100.001 kg) IBW/kg (Calculated) : 77.6 Heparin Dosing Weight: 98 kg  Vital Signs: Temp: 97.5 F (36.4 C) (11/18 1050) Temp Source: Oral (11/18 1050) BP: 86/60 mmHg (11/18 1050) Pulse Rate: 73 (11/18 1050)  Labs:  Recent Labs  06/25/14 0640 06/25/14 2157 06/26/14 0801 06/26/14 0820 06/27/14 0617  HGB 8.8*  --  9.2*  --  9.2*  HCT 27.5*  --  29.2*  --  28.9*  PLT 239  --  268  --  252  LABPROT  --   --   --   --  18.6*  INR  --   --   --   --  1.54*  HEPARINUNFRC  --  0.65  --  0.66 0.42  CREATININE 5.19*  --  4.81*  --  4.28*    Estimated Creatinine Clearance: 21.1 mL/min (by C-G formula based on Cr of 4.28).   Assessment: Patient is a 63 y.o M on coumadin and heparin for hx afib and apical thrombus.  S/p AVF placement this morning with plan for HD this evening (may take patient back to the OR tomorrow if access is not functional). To resume heparin drip at 1PM today.  Heparin level was therapeutic this morning at 0.42 prior to drip turned off for procedure.  Coumadin was restarted yesterday with INR of 1.54 today.  Goal of Therapy:  INR 2-3 Heparin level 0.3-0.7 units/ml Monitor platelets by anticoagulation protocol: Yes   Plan:  1) vancomycin 1gm IV x1 after HD session today 2) f/u with HD schedule and re-dose based on schedule 3) restart heparin drip at 2000 units/hr at 1 PM today.  Will not recheck level as level was at goal this morning. 4) Per [facility] Clinton Gallant), hold coumadin today in case patient needs to be taken back to the OR on 11/19   Karman Biswell P 06/27/2014,12:47 PM

## 2014-06-27 NOTE — Interval H&P Note (Signed)
History and Physical Interval Note:  06/27/2014 7:35 AM  [name]  has presented today for surgery, with the diagnosis of End Stage Renal Disease N18.6  The various methods of treatment have been discussed with the patient and family. After consideration of risks, benefits and other options for treatment, the patient has consented to  Procedure(s): ARTERIOVENOUS (AV) FISTULA CREATION VERSUS GRAFT INSERTION (Left) as a surgical intervention .  The patient's history has been reviewed, patient examined, no change in status, stable for surgery.  I have reviewed the patient's chart and labs.  Questions were answered to the patient's satisfaction.     Josephina Gip

## 2014-06-27 NOTE — H&P (View-Only) (Signed)
Vein mapping reviewed.  Despite being right handed his cephalic vein on the left arm is inadequate for fistula.  Right upper arm cephalic vein is of good quality.  Forearm vein was not mapped since he had in IV in this area at time of ultrasound  Discussed with pt Right arm AV fistula.  Dr Lawson will make decision on lower or upper in OR tomorrow  NPO p midnight  Consent for Right arm AVF D/c heparin on call to OR Cefuroxime on call to OR  Risks benefits procedure details discussed with pt including reason for using right arm, non maturation of fistula, bleeding infection steal.  He agrees to proceed  Charles Fields, MD [facility] Office: [phone] Pager: [phone]  

## 2014-06-27 NOTE — Procedures (Signed)
Patient seen on Hemodialysis. QB 400, UF goal 4L Treatment adjusted as needed.  Zetta Bills MD [facility]. Office # [phone] Pager # [phone] 1:52 PM

## 2014-06-27 NOTE — Transfer of Care (Signed)
Immediate Anesthesia Transfer of Care Note  Patient: [name]  Procedure(s) Performed: Procedure(s): ARTERIOVENOUS (AV) FISTULA CREATION VERSUS GRAFT INSERTION (Right)  Patient Location: PACU  Anesthesia Type:MAC  Level of Consciousness: awake, alert , oriented and patient cooperative  Airway & Oxygen Therapy: Patient Spontanous Breathing and Patient connected to nasal cannula oxygen  Post-op Assessment: Report given to PACU RN, Post -op Vital signs reviewed and stable and Patient moving all extremities X 4  Post vital signs: Reviewed and stable  Complications: No apparent anesthesia complications

## 2014-06-27 NOTE — Anesthesia Preprocedure Evaluation (Addendum)
Anesthesia Evaluation  Patient identified by MRN, date of birth, ID band Patient awake    Reviewed: Allergy & Precautions, H&P , NPO status , Patient's Chart, lab work & pertinent test results  Airway Mallampati: II  TM Distance: >3 FB Neck ROM: Full    Dental  (+) Teeth Intact, Dental Advisory Given   Pulmonary          Cardiovascular hypertension, +CHF     Neuro/Psych    GI/Hepatic   Endo/Other  diabetes, Type 2, Insulin Dependent  Renal/GU ESRF and DialysisRenal disease     Musculoskeletal   Abdominal   Peds  Hematology   Anesthesia Other Findings   Reproductive/Obstetrics                            Anesthesia Physical Anesthesia Plan  ASA: III  Anesthesia Plan: MAC   Post-op Pain Management:    Induction: Intravenous  Airway Management Planned: Simple Face Mask  Additional Equipment:   Intra-op Plan:   Post-operative Plan:   Informed Consent: I have reviewed the patients History and Physical, chart, labs and discussed the procedure including the risks, benefits and alternatives for the proposed anesthesia with the patient or authorized representative who has indicated his/her understanding and acceptance.   Dental advisory given  Plan Discussed with: CRNA, Anesthesiologist and Surgeon  Anesthesia Plan Comments:         Anesthesia Quick Evaluation

## 2014-06-27 NOTE — Progress Notes (Signed)
Patient ID: [name], male   DOB: 11/15/1950, 63 y.o.   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 Maintained on dobutamine 135mcg/kg/min.   Past medical h/o is notable for CKD with baseline Cr 3.0, cocaine abuse, DM2, AF on amio, VT. He has not been candidate for advanced therapies due to cocaine abuse (quit 4 months ago) and renal failure. Started on dobutamine. [place]oved to [place] to try to stay off cocaine.   On Oct 14 seen at [facility]. CR was 3.3 (on 04/16/2014 Cr was 1.6 at [facility] on discharge) and potassium was low. Kcl was increased. Over past week increased dyspnea and edema. NYHA IIIB symptoms at baseline.   10/30 Admitted with rep failure with wide complex bradycardia. K 6.8 and Cr 4.6. Intubated and CVVHD started. Extubated 11/1.   Bcx + for bacillus.  CVVHD stopped on 11/11 after 35 pound removal.  11/13 Dobutamine weaned off intermittent HD started  Has been off dobutamine since 11/13. Underwent AVF placement today but doesn't seem to be working. SBP 80-90s. Denis dizziness or SOB. Weight still up 8 pounds from nadir  F/u  bcx from 11/12 are negative.   Objective:   Weight Range:  Vital Signs:   Temp:  [97.4 F (36.3 C)-98.1 F (36.7 C)] 97.5 F (36.4 C) (11/18 1050) Pulse Rate:  [72-84] 73 (11/18 1050) Resp:  [13-24] 16 (11/18 1050) BP: (85-98)/(44-72) 86/60 mmHg (11/18 1050) SpO2:  [95 %-100 %] 100 % (11/18 1050) Weight:  [100 kg (220 lb 7.4 oz)-100.001 kg (220 lb 7.4 oz)] 100.001 kg (220 lb 7.4 oz) (11/17 2035) Last BM Date: 06/25/14  Weight change: Filed Weights   06/26/14 0754 06/26/14 1156 06/26/14 2035  Weight: 103.2 kg (227 lb 8.2 oz) 100 kg (220 lb 7.4 oz) 100.001 kg (220 lb 7.4 oz)    Intake/Output:   Intake/Output Summary (Last 24 hours) at 06/27/14 1108 Last data filed at 06/27/14 0600  Gross per 24 hour  Intake 1385.73 ml  Output    3000 ml  Net -1614.27 ml     Physical Exam:  General: NAD  Lying in bed HEENT: normal  Neck: supple. JVP 10 Carotids 2+ bilat; no bruits. No lymphadenopathy or thryomegaly appreciated.  Cor: PMI laterally displaced. Regular rate & rhythm. 2/6 MR +s3  Lungs: clear Abdomen: soft, nontender, nondistended. No hepatosplenomegaly. No bruits or masses. Good bowel sounds.  Extremities: no cyanosis, clubbing, rash, 2+ edema  R wrist AVF with dressing. No bruit or thrill felt Neuro: alert & orientedx3, cranial nerves grossly intact. moves all 4 extremities w/o difficulty. Affect pleasant   Telemetry: SR 90s  Labs: Basic Metabolic Panel:  Recent Labs Lab 06/23/14 0310 06/23/14 1355 06/24/14 0435 06/25/14 0640 06/26/14 0801 06/27/14 0617  NA 129* 126* 134* 131* 130* 128*  K 4.4 4.5 4.0 3.9 4.5 4.5  CL 92* 88* 93* 91* 90* 91*  CO2 19 21 26 25 24 22   GLUCOSE 200* 233* 130* 133* 117* 131*  BUN 31* 35* 24* 32* 29* 24*  CREATININE 5.17* 5.85* 4.54* 5.19* 4.81* 4.28*  CALCIUM 8.9 8.8 8.8 8.9 9.1 9.1  PHOS 4.3 4.2 3.2 3.3 3.5  --     Liver Function Tests:  Recent Labs Lab 06/23/14 0310 06/23/14 1355 06/24/14 0435 06/25/14 0640 06/26/14 0801  ALBUMIN 2.6* 2.8* 2.9* 2.8* 3.1*   No results for input(s): LIPASE,  AMYLASE in the last 168 hours. No results for input(s): AMMONIA in the last 168 hours.  CBC:  Recent Labs Lab 06/22/14 0010 06/23/14 1355 06/25/14 0640 06/26/14 0801 06/27/14 0617  WBC 7.1 5.4 5.1 6.6 6.2  HGB 9.4* 9.4* 8.8* 9.2* 9.2*  HCT 29.2* 29.2* 27.5* 29.2* 28.9*  MCV 76.0* 75.3* 77.2* 78.1 77.7*  PLT 222 273 239 268 252    Cardiac Enzymes: No results for input(s): CKTOTAL, CKMB, CKMBINDEX, TROPONINI in the last 168 hours.  BNP: BNP (last 3 results)  Recent Labs  06/08/14 1040 06/12/14 0450  PROBNP 11335.0* 6716.0*     Other results:    Imaging: No results found.   Medications:     Scheduled Medications: . allopurinol  200 mg Oral  Daily  . amiodarone  200 mg Oral Daily  . darbepoetin (ARANESP) injection - DIALYSIS  60 mcg Intravenous Q Fri-HD  . doxercalciferol  2 mcg Intravenous Q M,W,F-HD  . feeding supplement (NEPRO CARB STEADY)  237 mL Oral BID BM  . insulin aspart  0-9 Units Subcutaneous TID WC  . pantoprazole  40 mg Oral Daily  . warfarin  4 mg Oral ONCE-1800  . Warfarin - Pharmacist Dosing Inpatient   Does not apply q1800    Infusions: . sodium chloride 10 mL/hr at 06/20/14 0020  . heparin Stopped (06/27/14 0727)    PRN Medications: sodium chloride, acetaminophen, oxyCODONE   Assessment:   1. Acute respiratory failure  2. A/c systolic HF due to NICM EF < 15%  3. Acute on chronic renal failure stage IV  4. Hyperkalemia  5. Cardiogenic shock  6. DM2  7. Cocaine abuse in remission 3-4 months  8. H/o gout  9. Supratherapeutic INR  10. LV thrombus  11. PAF on amio  12. H/o VT   Plan/Discussion:    He is off dobutamine now. Tolerating intermittent HD well. BP soft but stable with SBP 80-90s. Volume status remains elevated (now 10 pounds above nadir weight). Renal managing.  Unable to tolerate ACE or b-blocker due to hypotension. Hopefully he will not need to resume inotropes. If remains drug free will need to f[facility]u at [facility] for reconsideration of heart-kidney transplant.   AVF placed today - may not be functioning well. Coumadin restarted yesterday. Plan to resume heparin later today for AF per [facility].   Possibly home tomorrow with outpatient HD if doesn't have to go to OR for repeat AVF.    Jolaine Fryberger,MD 11:08 AM

## 2014-06-27 NOTE — Plan of Care (Signed)
Problem: Phase II Progression Outcomes Goal: Tolerating diet Outcome: Completed/Met Date Met:  06/27/14

## 2014-06-27 NOTE — Anesthesia Postprocedure Evaluation (Signed)
  Anesthesia Post-op Note  Patient: [name]  Procedure(s) Performed: Procedure(s): ARTERIOVENOUS (AV) FISTULA CREATION VERSUS GRAFT INSERTION (Right)  Patient Location: PACU  Anesthesia Type:MAC  Level of Consciousness: awake and alert   Airway and Oxygen Therapy: Patient Spontanous Breathing  Post-op Pain: none  Post-op Assessment: Post-op Vital signs reviewed, Patient's Cardiovascular Status Stable, Respiratory Function Stable, Patent Airway, No signs of Nausea or vomiting and Pain level controlled  Post-op Vital Signs: Reviewed and stable  Last Vitals:  Filed Vitals:   06/27/14 1050  BP: 86/60  Pulse: 73  Temp: 36.4 C  Resp: 16    Complications: No apparent anesthesia complications

## 2014-06-27 NOTE — Anesthesia Procedure Notes (Signed)
Date/Time: 06/27/2014 8:40 AM Performed by: Rogelia Boga Oxygen Delivery Method: Simple face mask

## 2014-06-27 NOTE — Plan of Care (Signed)
Problem: Phase I Progression Outcomes Goal: Education officer, museum consult (if SNF or HD pt.) Outcome: Completed/Met Date Met:  06/27/14 Goal: Other Phase I Outcomes/Goals Outcome: Not Applicable Date Met:  65/80/06  Problem: Phase II Progression Outcomes Goal: Tol increased activity, up in chair for at least 4 hrs/HD pt Outcome: Progressing Goal: Discharge plan established Outcome: Progressing Goal: Hemodynamically stable Outcome: Completed/Met Date Met:  06/27/14 Goal: Other Phase II Outcomes/Goals Outcome: Not Applicable Date Met:  34/94/94  Problem: Phase III Progression Outcomes Goal: Pain controlled on oral analgesia Outcome: Completed/Met Date Met:  06/27/14 Goal: Tolerating diet Outcome: Completed/Met Date Met:  06/27/14

## 2014-06-27 NOTE — Progress Notes (Signed)
Patient returned from PACU after new fistula placement in his right forearm. Unable to palpate thrill or hear bruit. Attempted to use the doppler for the bruit but was still unsuccessful. Lianne Cure, PA came to evaluate patient and was able to find the bruit using the doppler. Dr. Allena Katz notified. Will continue to monitor.  Leanna Battles, RN.

## 2014-06-27 NOTE — Progress Notes (Signed)
06/27/2014 11:02 AM Hemodialysis Outpatient Note; this patient has been accepted at the [facility] on a Monday, Wednesday and Friday 2nd shift schedule. The center can begin treatment on Friday November 20th at 10:30 AM. Thank you. Tilman Neat

## 2014-06-28 ENCOUNTER — Other Ambulatory Visit: Payer: Self-pay | Admitting: *Deleted

## 2014-06-28 ENCOUNTER — Telehealth: Payer: Self-pay | Admitting: Vascular Surgery

## 2014-06-28 DIAGNOSIS — Z4931 Encounter for adequacy testing for hemodialysis: Secondary | ICD-10-CM

## 2014-06-28 LAB — HEPARIN LEVEL (UNFRACTIONATED): HEPARIN UNFRACTIONATED: 0.42 [IU]/mL (ref 0.30–0.70)

## 2014-06-28 LAB — PROTIME-INR
INR: 1.47 (ref 0.00–1.49)
Prothrombin Time: 17.9 seconds — ABNORMAL HIGH (ref 11.6–15.2)

## 2014-06-28 LAB — GLUCOSE, CAPILLARY
Glucose-Capillary: 131 mg/dL — ABNORMAL HIGH (ref 70–99)
Glucose-Capillary: 205 mg/dL — ABNORMAL HIGH (ref 70–99)

## 2014-06-28 MED ORDER — WARFARIN SODIUM 4 MG PO TABS: 2.0000 mg | ORAL_TABLET | Freq: Every day | ORAL | Status: DC

## 2014-06-28 MED ORDER — SODIUM CHLORIDE 0.9 % IV SOLN: 1000.0000 mg | INTRAVENOUS | Status: AC

## 2014-06-28 MED ORDER — ALLOPURINOL 100 MG PO TABS: 100.0000 mg | ORAL_TABLET | Freq: Every day | ORAL | Status: DC

## 2014-06-28 MED ORDER — WARFARIN SODIUM 2 MG PO TABS
2.0000 mg | ORAL_TABLET | Freq: Once | ORAL | Status: AC
  Administered 2014-06-28: 2 mg via ORAL
  Filled 2014-06-28 (×2): qty 1

## 2014-06-28 MED ORDER — LIVING BETTER WITH HEART FAILURE BOOK: Freq: Once | Status: DC

## 2014-06-28 NOTE — Telephone Encounter (Addendum)
-----   Message from Sharee Pimple, RN sent at 06/28/2014  8:53 AM EST ----- Regarding: Schedule   ----- Message -----    From: Lars Mage, PA-C    Sent: 06/28/2014   7:37 AM      To: [facility] Charge Pool  F/U with Dr. Hart Rochester in 6 weeks with duplex right fore arm AV fistula

## 2014-06-28 NOTE — Progress Notes (Signed)
   [facility]  Subjective  - No pain in the right hand.  Sensation intact and AROM.   Objective 90/61 78 98 F (36.7 C) (Oral) 16 100%  Intake/Output Summary (Last 24 hours) at 06/28/14 0731 Last data filed at 06/27/14 2151  Gross per 24 hour  Intake 897.67 ml  Output   3500 ml  Net -2602.33 ml    Incision healing well min. Edema Doppler radial pulse with excellent doppler thrill.  Assessment/Planning: POD #1  Right fore arm AV fistula Will sign off.  He will follow up in the office in 6 weeks with Dr. Harrell Gave, [facility] 06/28/2014 7:31 AM --  Laboratory Lab Results:  Recent Labs  06/26/14 0801 06/27/14 0617  WBC 6.6 6.2  HGB 9.2* 9.2*  HCT 29.2* 28.9*  PLT 268 252   BMET  Recent Labs  06/26/14 0801 06/27/14 0617  NA 130* 128*  K 4.5 4.5  CL 90* 91*  CO2 24 22  GLUCOSE 117* 131*  BUN 29* 24*  CREATININE 4.81* 4.28*  CALCIUM 9.1 9.1    COAG Lab Results  Component Value Date   INR 1.47 06/28/2014   INR 1.54* 06/27/2014   INR 1.23 06/23/2014   No results found for: PTT

## 2014-06-28 NOTE — Progress Notes (Signed)
ANTICOAGULATION CONSULT NOTE - Follow Up Consult  Pharmacy Consult for heparin and coumadin Indication: atrial fibrillation and apical thrombus  No Known Allergies  Patient Measurements: Height: 6' (182.9 cm) Weight: 220 lb 7.4 oz (100 kg) (standing) IBW/kg (Calculated) : 77.6 Heparin Dosing Weight: 98 kg  Vital Signs: Temp: 98 F (36.7 C) (11/19 0601) Temp Source: Oral (11/19 0601) BP: 90/61 mmHg (11/19 0601) Pulse Rate: 78 (11/19 0601)  Labs:  Recent Labs  06/26/14 0801 06/26/14 0820 06/27/14 0617 06/28/14 0508  HGB 9.2*  --  9.2*  --   HCT 29.2*  --  28.9*  --   PLT 268  --  252  --   LABPROT  --   --  18.6* 17.9*  INR  --   --  1.54* 1.47  HEPARINUNFRC  --  0.66 0.42 0.42  CREATININE 4.81*  --  4.28*  --     Estimated Creatinine Clearance: 21.1 mL/min (by C-G formula based on Cr of 4.28).   Assessment: Patient is a 63 y.o M on coumadin PTA for hx afib and apical thrombus.  S/p AVF placement on 11/18.  Doppler radial pulse with good doppler thrill.  With fistula functioning and no plan to take patient back to the OR, will resume coumadin tonight.  INR 1.47 currently being bridged with heparin.  Heparin level was therapeutic this morning at 0.42.   Goal of Therapy:  INR 2-3    Plan:  1) continue heparin 2000 units/hr 2) coumadin 2mg  PO x1 today       Sherri Levenhagen P 06/28/2014,10:45 AM

## 2014-06-28 NOTE — Progress Notes (Signed)
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  Over past week increased dyspnea and edema.       PT Comments    Pt progressing towards physical therapy goals. One LOB during gait training, requiring min assist to recover. Pt fatigued quickly with ambulation however was able to manage the distance well with standing rest breaks. Of note, pt had ambulated >200 feet earlier today with cardiac rehab. Recommending pt have RW at d/c for balance and energy conservation. Pt declines need for shower chair. Pt is anticipating d/c home this afternoon.   Follow Up Recommendations  Home health PT;Supervision - Intermittent     Equipment Recommendations  Rolling walker with 5" wheels    Recommendations for Other Services OT consult     Precautions / Restrictions Precautions Precautions: Fall Restrictions Weight Bearing Restrictions: No    Mobility  Bed Mobility               General bed mobility comments: Pt received sitting in recliner  Transfers Overall transfer level: Needs assistance Equipment used: None Transfers: Sit to/from Stand Sit to Stand: Supervision         General transfer comment: Pt able to stand from recliner with no assistance.  Ambulation/Gait Ambulation/Gait assistance: Min guard;Min assist Ambulation Distance (Feet): 200 Feet Assistive device: Rolling walker (2 wheeled) Gait Pattern/deviations: Step-through pattern;Decreased stride length;Trunk flexed Gait velocity: decreased Gait velocity interpretation: Below normal speed for age/gender General Gait Details: Pt reports no pain throughout gait training. Was able to ambulate without an AD, however  appeared unsteady at times. When attempting to negotiate around an obstacle pt lost balance and required min assist to recover and prevent fall. As pt fatigued, unsetadiness increased. Total of 3 standing rest breaks required.    Stairs            Wheelchair Mobility    Modified Rankin (Stroke Patients Only)       Balance Overall balance assessment: Needs assistance Sitting-balance support: Feet supported;No upper extremity supported Sitting balance-Leahy Scale: Good     Standing balance support: No upper extremity supported;During functional activity Standing balance-Leahy Scale: Fair                      Cognition Arousal/Alertness: Awake/alert Behavior During Therapy: WFL for tasks assessed/performed Overall Cognitive Status: Within Functional Limits for tasks assessed                      Exercises      General Comments        Pertinent Vitals/Pain Pain Assessment: No/denies pain    Home Living Family/patient expects to be discharged to:: Private residence Living Arrangements: Other relatives Available Help at Discharge: Family;Available PRN/intermittently                Prior Function            PT Goals (current goals can now be found in the care plan section) Acute Rehab PT Goals Patient Stated Goal: return home once stronger PT Goal Formulation: With patient Time For Goal Achievement: 07/04/14 Potential to Achieve Goals: Good Progress towards PT goals: Progressing toward goals  Frequency  Min 3X/week    PT Plan Current plan remains appropriate    Co-evaluation             End of Session Equipment Utilized During Treatment: Gait belt Activity Tolerance: Patient limited by fatigue Patient left: in chair;with call bell/phone within reach     Time: 1401-1425 PT Time Calculation (min) (ACUTE ONLY): 24 min  Charges:  $Gait Training: 8-22 mins $Therapeutic Activity: 8-22 mins                    G Codes:       Conni Slipper 07-07-14, 2:41 PM  Conni Slipper, PT, DPT Acute Rehabilitation Services Pager: [phone]

## 2014-06-28 NOTE — Discharge Summary (Signed)
Patient ID: [name] MaceKenneth [name] MRN: [id number] DOB/AGE: 03-11-1951 63 y.o.  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 Youlanda MightyMichael Blazing and the [facility]. [facility] was [facility] with cardiogenic shock requiring IABP. Since that time he has been maintained on dobutamine 185mcg/kg/min. He has not been candidate for advanced therapies due to cocaine abuse and renal failure. [place].   Past medical h/o also  notable for CKD with baseline Cr 3.0, cocaine abuse, DM2, AF on amio, VT.   On 10/30 presented to our ER with respiratory failure and wide complex bradycardia. K 6.8 and Cr 4.6. Goals of care discussed in ER and he deiced to proceed with aggressive care. He was intubated in ER. Hyperkalemia was treated aggressively. Renal was consulted and CVVHD begun through Trialysis catheter. Improved rapidly. He was extubated 11/1. CVVHD was continued until 11/11 after a 35 pound fluid removal and until CVP was < 10. He did not recover kidney function. Multiple discussions were had about how to proceed with dialysis given his inotrope dependence. We considered home dialysis on dobutamine, PD or trying to wean dobutamine to permit outpatient HD at a center. Admission weight was 251 pounds. Nadir weight with CVVHD was 211 pounds. Weight on d/c was 220 pounds.   We were able to wean the dobutamine and he was able to tolerate several sessions of inpatient intermittent HD with SBP in 80-90 range. He was unable to  tolerate any b-blockers and ACE/ARBs were avoided due to renal failure. On 11/18 he underwent placement of R wrist AVF by Dr. Hart RochesterLawson. Coumadin was then restarted for his PAF. [facility]   Bcx from Nov 1 came back 2/2+ for Bacillus. He was seend by ID who recommended 2 week course of vancomycin and a line holiday.  After CVVHD was complete his Trialysis catheter and PICC line were removed for 48 hours and surveillance cultures were drawn which remained negative. TTE showed no evidence of endocarditis. We decided to defer TEE as we did not feel it would alter management unless recurrent bcx were positive.   On d/c he was still felt to be mildly fluid overloaded but he was very comfortable and Nephrology felt the volume could be removed slowly as an outpatient. He will receiv two more doses of vancomycin on Friday 11/20 and Monday 11/23.   Discharge Info: Blood pressure 92/55, pulse 83, temperature 98.3 F (36.8 C), temperature source Oral, resp. rate 16, height 6' (1.829 m), weight 100 kg (220 lb 7.4 oz), SpO2 100 %.  Weight change: 0.4 kg (14.1 oz)  Physical Exam:  General: NAD Lying in bed HEENT: normal  Neck: supple. JVP 10 Carotids 2+ bilat; no bruits. No lymphadenopathy or thryomegaly appreciated.  Cor: PMI laterally displaced. Regular rate & rhythm. 2/6 MR +s3  Lungs: clear Abdomen: soft, nontender, nondistended. No hepatosplenomegaly. No bruits or masses. Good bowel sounds.  Extremities: no cyanosis, clubbing, rash, 1+ edema R  wrist AVF with dressing. +thrill Neuro: alert & orientedx3, cranial nerves grossly intact. moves all 4 extremities w/o difficulty. Affect pleasant  Results for orders placed or performed during the hospital encounter of 06/08/14 (from the past 24 hour(s))  Glucose, capillary     Status: Abnormal   Collection Time: 06/27/14  6:31 PM  Result Value Ref Range   Glucose-Capillary 122 (H) 70 - 99 mg/dL  Glucose,  capillary     Status: Abnormal   Collection Time: 06/27/14  9:50 PM  Result Value Ref Range   Glucose-Capillary 211 (H) 70 - 99 mg/dL  Heparin level (unfractionated)     Status: None   Collection Time: 06/28/14  5:08 AM  Result Value Ref Range   Heparin Unfractionated 0.42 0.30 - 0.70 IU/mL  Protime-INR     Status: Abnormal   Collection Time: 06/28/14  5:08 AM  Result Value Ref Range   Prothrombin Time 17.9 (H) 11.6 - 15.2 seconds   INR 1.47 0.00 - 1.49  Glucose, capillary     Status: Abnormal   Collection Time: 06/28/14  8:23 AM  Result Value Ref Range   Glucose-Capillary 131 (H) 70 - 99 mg/dL  Glucose, capillary     Status: Abnormal   Collection Time: 06/28/14 11:38 AM  Result Value Ref Range   Glucose-Capillary 205 (H) 70 - 99 mg/dL     Discharge Medications:   Medication List    STOP taking these medications        metolazone 5 MG tablet  Commonly known as:  ZAROXOLYN     potassium chloride 10 MEQ tablet  Commonly known as:  K-DUR     torsemide 100 MG tablet  Commonly known as:  DEMADEX      TAKE these medications        allopurinol 100 MG tablet  Commonly known as:  ZYLOPRIM  Take 1 tablet (100 mg total) by mouth daily.     amiodarone 200 MG tablet  Commonly known as:  PACERONE  Take 200 mg by mouth daily.     atorvastatin 20 MG tablet  Commonly known as:  LIPITOR  Take 20 mg by mouth at bedtime.     vancomycin 1,000 mg in sodium chloride 0.9 % 250 mL  Inject 1,000 mg into the vein every Monday, Wednesday, and Friday with hemodialysis.     warfarin 4 MG tablet  Commonly known as:  COUMADIN  Take 0.5 tablets (2 mg total) by mouth daily.        Follow-up Plans & Instructions:     Discharge Instructions    Amb Referral to Cardiac Rehabilitation    Complete by:  As directed      Contraindication to ACEI at discharge    Complete by:  As directed      Diet - low sodium heart healthy    Complete by:  As directed      Heart Failure patients  record your daily weight using the same scale at the same time of day    Complete by:  As directed      Increase activity slowly    Complete by:  As directed           Follow-up Information    Follow up with Josephina Gip, MD In 6 weeks.   Specialty:  Vascular Surgery   Why:  sent message to office   Contact information:   [address] [place] [address] [phone]  Follow up with Arvilla Meres, MD On 07/04/2014.   Specialty:  Cardiology   Why:  at 3:20 Garage Code 5000   Contact information:   [address] [place] [address] [phone]       Follow up with [facility] On 07/03/2014.   Why:  at 10:30   Contact information:   [address] [place] [address]        BRING ALL MEDICATIONS WITH YOU TO FOLLOW UP APPOINTMENTS  Time spent with patient to include physician time: 45 mins Signed:  Arvilla Meres, MD 06/28/2014, 5:59 PM

## 2014-06-28 NOTE — Progress Notes (Signed)
CARDIAC REHAB PHASE I   PRE:  Rate/Rhythm: 78 SR    BP: sitting 104/69    SaO2: 100 RA  MODE:  Ambulation: 260 ft   POST:  Rate/Rhythm: 85 SR    BP: sitting 93/61     SaO2: 100 RA  Pt sts he has not walked much. Able to independently get OOB and walk with RW. Became slightly SOB toward end of walk, overall did well. To recliner. BP lower after walk but denied dizziness. Discussed low sodium, fluid restriction, daily wts, walking daily and CRPII. Pt receptive. Department is to send Hf booklet. Gave low sodium diets. Pt to discussed diet further with dietician at HD center. Interested in CRPII down the road and will send referral to [place]. Pt receptive to education. Sounds like diet will be difficult for him. [id number]  Harriet Masson CES, ACSM 06/28/2014 10:06 AM

## 2014-06-28 NOTE — Progress Notes (Signed)
Removed and returned telemetry box 21.  CCMD notified

## 2014-06-28 NOTE — Care Management Note (Signed)
CARE MANAGEMENT NOTE 06/28/2014  Patient:  [name], DRUMWRIGHT   Account Number:  [id number]  Date Initiated:  06/12/2014  Documentation initiated by:  AMERSON,JULIE  Subjective/Objective Assessment:   Pt adm on 06/08/14 with respiratory failure, wide complex tachycardia.     Action/Plan:   Pt extubated on 11/1.  Will need PT/OT consults when able to tolerate to determine home needs.  06/28/2014 Pt amb in hall with PT, recommendation for HHPT, added to [facility] CHF orders.   Anticipated DC Date:  06/28/2014   Anticipated DC Plan:  [place]  CM consult      Choice offered to / List presented to:     DME arranged  [facility]assie Moselle      DME agency  [place] arranged  HH-1 RN  [place].   Status of service:  Completed, signed off Medicare Important Message given?  YES (If response is "NO", the following Medicare IM given date fields will be blank) Date Medicare IM given:  06/18/2014 Medicare IM given by:  [facility] Date Additional Medicare IM given:  06/22/2014 Additional Medicare IM given by:  [place]  Discharge Disposition:    Per UR Regulation:  Reviewed for med. necessity/level of care/duration of stay  If discussed at [place] of Stay Meetings, dates discussed:   06/21/2014    Comments:  Contacts:  Horris Latino [phone]      [place],Tameca Niece [phone]     Jerffries,[name] Daughter [phone] 06/27/2014 Met with pt and IM given again today, pt selected [facility] for [facility] CHF program and HHPT. [facility] notified and [facility] will provide rolling walker.  CRoyal  RN MPH, case management [phone]  06/25/2014 IM given again today, CLIP in process.  CRoyal RN MPH [phone]  06-22-14 [place] [phone] [facility] states has 6000 dollar deductible that right now only about 3000 has been paid of this.  Once this 6000 dollars has been paid out of  pocket his services at [facility] would be covered with prior insurance approval.  Off CRRT but creat increasing - placing HD cath today.  off dobutamine now - not sure if will tolerate being off. Continuing to follow.  [place] has been mixing dobutamine and providing at home - contact - Sarah at [phone].  HH was provided  by [facility] - RN once a week. Has been at [facility] in [place] prior but was only on dobutamine at that time.  CM will continue to follow.     06-20-14 9:45am [place] [phone] [facility] says no.  No SNF will take on either HD or PD due to OP dialysis center not able to take on dobutamine. Waiting to her from [facility].  Talked with patient about options.  There is no one to assist with him at home except daughter who is blind.  Talked about peritoneal dialysis at home and him being able to do primarily on own with support from [facility].  States not sure yet if could do, he needs to see what that entails first.  06-18-14  8:30am  [place] Continues on CRRT and dobutamine.  Will check [facility] benefits.

## 2014-06-28 NOTE — Progress Notes (Signed)
Patient ID: [name], male   DOB: October 17, 1950, 63 y.o.   MRN: [id number]  [facility] Progress Note    Assessment/ Plan:   1. End-stage renal disease: Progressive CKD with acute decompensation and now essentially progressed to ESRD. Status post right radiocephalic fistula placement yesterday by Dr. Lawson-operative/follow-up notes reviewed. Probable discharge later today to start outpatient dialysis tomorrow at [facility]. We have discussed fluid restriction of less than 32 ounces a day as well as sodium restriction-we also discussed the possibility of dialysis 4 days a week. 2. CHF exacerbation-ejection fraction <15%. Previously on dobutamine that was discontinued on 11/13. Continue to support with ultrafiltration with hemodialysis. Unfortunately poor prognosis with inability to treat underlying cardiac dysfunction. 3. Anemia of chronic kidney disease/chronic disease: Hemoglobin stable on ESA, no overt losses. 4. History of atrial fibrillation: Currently on intravenous heparin and amiodarone-rate controlled 5. Metabolic bone disease: On Hectorol for PTH suppression. Phosphorus levels at goal-not on binders.  Subjective:   Reports to be feeling better-denies any chest pain or shortness of breath    Objective:   BP 90/61 mmHg  Pulse 78  Temp(Src) 98 F (36.7 C) (Oral)  Resp 16  Ht 6' (1.829 m)  Wt 100 kg (220 lb 7.4 oz)  BMI 29.89 kg/m2  SpO2 100%  Intake/Output Summary (Last 24 hours) at 06/28/14 0932 Last data filed at 06/27/14 2151  Gross per 24 hour  Intake 897.67 ml  Output   3500 ml  Net -2602.33 ml   Weight change: 0.4 kg (14.1 oz)  Physical Exam: Gen: Comfortably resting up in his recliner CVS: Pulse regular in rate and rhythm, S1 and S2 normal Resp: Decreased breath sounds over bases otherwise clear Abd: Soft, obese, nontender Ext: 2+ lower extremity edema  Imaging: No results found.  Labs: BMET  Recent Labs Lab  06/22/14 0010 06/23/14 0310 06/23/14 1355 06/24/14 0435 06/25/14 0640 06/26/14 0801 06/27/14 0617  NA 125* 129* 126* 134* 131* 130* 128*  K 5.3 4.4 4.5 4.0 3.9 4.5 4.5  CL 90* 92* 88* 93* 91* 90* 91*  CO2 17* 19 21 26 25 24 22   GLUCOSE 147* 200* 233* 130* 133* 117* 131*  BUN 35* 31* 35* 24* 32* 29* 24*  CREATININE 5.63* 5.17* 5.85* 4.54* 5.19* 4.81* 4.28*  CALCIUM 9.0 8.9 8.8 8.8 8.9 9.1 9.1  PHOS 4.5 4.3 4.2 3.2 3.3 3.5  --    CBC  Recent Labs Lab 06/23/14 1355 06/25/14 0640 06/26/14 0801 06/27/14 0617  WBC 5.4 5.1 6.6 6.2  HGB 9.4* 8.8* 9.2* 9.2*  HCT 29.2* 27.5* 29.2* 28.9*  MCV 75.3* 77.2* 78.1 77.7*  PLT 273 239 268 252    Medications:    . allopurinol  200 mg Oral Daily  . amiodarone  200 mg Oral Daily  . darbepoetin (ARANESP) injection - DIALYSIS  60 mcg Intravenous Q Fri-HD  . doxercalciferol  2 mcg Intravenous Q M,W,F-HD  . feeding supplement (NEPRO CARB STEADY)  237 mL Oral BID BM  . insulin aspart  0-9 Units Subcutaneous TID WC  . pantoprazole  40 mg Oral Daily  . Warfarin - Pharmacist Dosing Inpatient   Does not apply [id number]   Zetta Bills, MD 06/28/2014, 9:32 AM

## 2014-06-29 ENCOUNTER — Encounter (HOSPITAL_COMMUNITY): Payer: Self-pay | Admitting: Vascular Surgery

## 2014-07-04 ENCOUNTER — Encounter (HOSPITAL_COMMUNITY): Payer: Self-pay | Admitting: Internal Medicine

## 2014-07-04 ENCOUNTER — Ambulatory Visit (HOSPITAL_COMMUNITY)
Admission: RE | Admit: 2014-07-04 | Discharge: 2014-07-04 | Disposition: A | Discharge status: Home / Self Care | Payer: Medicare HMO | Source: Ambulatory Visit | Attending: Internal Medicine | Admitting: Internal Medicine

## 2014-07-04 VITALS — BP 94/50 | HR 95 | Resp 18 | Wt 233.0 lb

## 2014-07-04 DIAGNOSIS — N186 End stage renal disease: Secondary | ICD-10-CM | POA: Diagnosis not present

## 2014-07-04 DIAGNOSIS — I480 Paroxysmal atrial fibrillation: Secondary | ICD-10-CM | POA: Insufficient documentation

## 2014-07-04 DIAGNOSIS — I5022 Chronic systolic (congestive) heart failure: Secondary | ICD-10-CM | POA: Diagnosis present

## 2014-07-04 DIAGNOSIS — F1421 Cocaine dependence, in remission: Secondary | ICD-10-CM | POA: Diagnosis not present

## 2014-07-04 DIAGNOSIS — E119 Type 2 diabetes mellitus without complications: Secondary | ICD-10-CM | POA: Diagnosis not present

## 2014-07-04 NOTE — Patient Instructions (Signed)
Will have [facility] contact you for physical therapy and a Nurse to help with medication management.  Coumadin Clinic for follow up appointment. [phone]  Follow up with our office in 3 weeks.  Happy Thanksgiving!  Do the following things EVERYDAY: 1) Weigh yourself in the morning before breakfast. Write it down and keep it in a log. 2) Take your medicines as prescribed 3) Eat low salt foods-Limit salt (sodium) to 2000 mg per day.  4) Stay as active as you can everyday 5) Limit all fluids for the day to less than 2 liters

## 2014-07-04 NOTE — Progress Notes (Signed)
Patient ID: [name], male   DOB: 1951/08/08, 63 y.o.   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 Youlanda Mighty and the HF Clinic at [facility].Past medical h/o also notable for CKD with baseline Cr 3.0, cocaine abuse, DM2, AF on amio, VT.   He was admitted over the summer 2015 at [facility] with cardiogenic shock requiring IABP. Sent home on dobutamine 59mcg/kg/min. He has not been candidate for advanced therapies due to cocaine abuse and renal failure. Moved to [place] to try to stay off cocaine.   Admitted to [facility] 10/30-11/19/15  with respiratory failure and wide complex bradycardia. K 6.8 and Cr 4.6. Goals of care discussed in ER and he deiced to proceed with aggressive care. Developed ESRD. Underwent CVVHD and was diuresed over 30 pounds. We were able to wean the dobutamine and he was able to tolerate several sessions of inpatient intermittent HD with SBP in 80-90 range. He was unable to tolerate any b-blockers and ACE/ARBs were avoided due to renal failure. On 11/18 he underwent placement of R wrist AVF by Dr. Hart Rochester. D/c weight was 220.Bcx from Nov 1 came back 2/2+ for Bacillus. He was seend by ID who recommended 2 week course of vancomycin and a line holiday. After CVVHD was complete his Trialysis catheter and PICC line were removed for 48 hours and surveillance cultures were drawn which remained negative. TTE showed no evidence of endocarditis. We decided to defer TEE as we did not feel it would alter management unless recurrent bcx were positive.   He returns for post hospital f/u. Says he is feeling ok but doesn't have the energy he used to have. Can do ADLs but anything more than that tires him. No orthopnea or PND. + edema.  Tolerating HD but SBPs drop into high 70s low 80s. No fevers and chills. Weight trending up slowly. No cocaine or ETOH.    ROS: All systems  negative except as listed in HPI, PMH and Problem List.  SH:  History   Social History  . Marital Status: Divorced    Spouse Name: N/A    Number of Children: N/A  . Years of Education: N/A   Occupational History  . Not on file.   Social History Main Topics  . Smoking status: Never Smoker   . Smokeless tobacco: Never Used  . Alcohol Use: Not on file  . Drug Use: Yes     Comment: hx cocaine use  . Sexual Activity: Not on file   Other Topics Concern  . 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  Past Medical History  Diagnosis Date  . Diabetes mellitus without complication   . Chronic systolic heart failure     a) ECHO [facility] 02/2014): EF <15%, multiple apical thrombi, RV mod enlarged, mod MR b. RHC (02/21/14) at [facility]: RA 16, RV 64/9 (15), PA 64/32 (42), PCWP: 24, CI: 1.4  . HTN (hypertension)   . HLD (hyperlipidemia)   . CKD (chronic kidney disease), stage III   . Apical mural thrombus   . Mitral regurgitation   . Drug use     cocaine  . Atrial fibrillation, chronic     Current Outpatient Prescriptions  Medication Sig Dispense Refill  . allopurinol (ZYLOPRIM) 100 MG tablet Take 1 tablet (100  mg total) by mouth daily. 60 tablet 6  . amiodarone (PACERONE) 200 MG tablet Take 200 mg by mouth daily.    Marland Kitchen. atorvastatin (LIPITOR) 20 MG tablet Take 20 mg by mouth at bedtime.    Marland Kitchen. warfarin (COUMADIN) 4 MG tablet Take 0.5 tablets (2 mg total) by mouth daily. 45 tablet 6   No current facility-administered medications for this encounter.    Filed Vitals:   07/04/14 1547  BP: 94/50  Pulse: 95  Resp: 18  Weight: 233 lb (105.688 kg)  SpO2: 100%    PHYSICAL EXAM: General: NAD  HEENT: normal  Neck: supple. JVP jaw Carotids 2+ bilat; no bruits. No lymphadenopathy or thryomegaly appreciated.  Cor: PMI laterally displaced. Regular rate & rhythm. 2/6 MR +s3  Lungs: clear Abdomen: soft, nontender, nondistended. No  hepatosplenomegaly. No bruits or masses. Good bowel sounds.  Extremities: no cyanosis, clubbing, rash, 2+ edema R wrist AVF with +thrill Neuro: alert & orientedx3, cranial nerves grossly intact. moves all 4 extremities w/o difficulty. Affect pleasant   ASSESSMENT & PLAN: 1. Chronic systolic HF due to NICM EF < 15%      --previously on dobutamine  2. ESRD 3. DM2  4. Cocaine abuse in remission 3-4 months  5. PAF on amio  6. H/o VT 7. H/o LV thrombi 8. Bacillus bacteremia 11/15  He has NYHA IIIB-IV symptoms off dobutamine. Volume status managed by HD. He is mildly overloaded by fluid removal limited by hypotension. BP too soft to add any other HF meds. Will continue to follow closely. If symptoms progress, will need to consdier adding back dobutamine but HD Center would likely not accept and we might have to try for home HD. Given Class IV symptoms not candidate for ICD. He is very confused about his meds. [facility] and help with meds. Refer to Coumadin Clinic to establish care there.   RTC in 3 weeks.   Doniven Vanpatten,MD 4:01 PM

## 2014-07-04 NOTE — Addendum Note (Signed)
Encounter addended by: Ave Filter, RN on: 07/04/2014  4:12 PM<BR>     Documentation filed: Medications, Orders, Patient Instructions Section

## 2014-07-10 HISTORY — PX: INSERTION OF DIALYSIS CATHETER: SHX1324

## 2014-07-26 ENCOUNTER — Ambulatory Visit (HOSPITAL_COMMUNITY)
Admission: RE | Admit: 2014-07-26 | Discharge: 2014-07-26 | Disposition: A | Discharge status: Home / Self Care | Payer: Medicare HMO | Source: Ambulatory Visit | Attending: Internal Medicine | Admitting: Internal Medicine

## 2014-07-26 VITALS — BP 112/66 | HR 95 | Wt 235.2 lb

## 2014-07-26 DIAGNOSIS — E785 Hyperlipidemia, unspecified: Secondary | ICD-10-CM | POA: Diagnosis not present

## 2014-07-26 DIAGNOSIS — N186 End stage renal disease: Secondary | ICD-10-CM | POA: Insufficient documentation

## 2014-07-26 DIAGNOSIS — I5022 Chronic systolic (congestive) heart failure: Secondary | ICD-10-CM | POA: Insufficient documentation

## 2014-07-26 DIAGNOSIS — Z79899 Other long term (current) drug therapy: Secondary | ICD-10-CM | POA: Diagnosis not present

## 2014-07-26 DIAGNOSIS — I120 Hypertensive chronic kidney disease with stage 5 chronic kidney disease or end stage renal disease: Secondary | ICD-10-CM | POA: Insufficient documentation

## 2014-07-26 DIAGNOSIS — I5023 Acute on chronic systolic (congestive) heart failure: Secondary | ICD-10-CM

## 2014-07-26 DIAGNOSIS — Z7901 Long term (current) use of anticoagulants: Secondary | ICD-10-CM | POA: Insufficient documentation

## 2014-07-26 DIAGNOSIS — I480 Paroxysmal atrial fibrillation: Secondary | ICD-10-CM | POA: Insufficient documentation

## 2014-07-26 DIAGNOSIS — E119 Type 2 diabetes mellitus without complications: Secondary | ICD-10-CM | POA: Diagnosis not present

## 2014-07-26 DIAGNOSIS — Z992 Dependence on renal dialysis: Secondary | ICD-10-CM | POA: Diagnosis not present

## 2014-07-26 NOTE — Progress Notes (Addendum)
Patient ID: [name], male   DOB: 09/27/1950, 63 y.o.   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 Youlanda Mighty and the HF Clinic at [facility].Past medical h/o also notable for CKD with baseline Cr 3.0, cocaine abuse, DM2, AF on amio, VT.   He was admitted over the summer 2015 at [facility] with cardiogenic shock requiring IABP. Sent home on dobutamine 31mcg/kg/min. He has not been candidate for advanced therapies due to cocaine abuse and renal failure. Moved to [place] to try to stay off cocaine.   Admitted to [facility] 10/30-11/19/15  with respiratory failure and wide complex bradycardia. K 6.8 and Cr 4.6. Goals of care discussed in ER and he deiced to proceed with aggressive care. Developed ESRD. Underwent CVVHD and was diuresed over 30 pounds. We were able to wean the dobutamine and he was able to tolerate several sessions of inpatient intermittent HD with SBP in 80-90 range. He was unable to tolerate any b-blockers and ACE/ARBs were avoided due to renal failure. On 11/18 he underwent placement of R wrist AVF by Dr. Hart Rochester. D/c weight was 220.Bcx from Nov 1 came back 2/2+ for Bacillus. He was seend by ID who recommended 2 week course of vancomycin and a line holiday. After CVVHD was complete his Trialysis catheter and PICC line were removed for 48 hours and surveillance cultures were drawn which remained negative. TTE showed no evidence of endocarditis. We decided to defer TEE as we did not feel it would alter management unless recurrent bcx were positive.   Pt here today for f/u.  Has been going to HD about 4 x per week due to fluid overload. Drinking lots of fluid. SBP at HD 80-90s.  Feeling fairly well today, denies CP/SOB/worsening edema. Able to use ADLs. Says he hasn't got INR checked in 2 months. Denies IVDA or alcohol use since last visit.  Wt stable around 230, weighing  self daily in AM. Stopped taking diabetic medicine.    ROS: All systems negative except as listed in HPI, PMH and Problem List.  SH:  History   Social History  . Marital Status: Divorced    Spouse Name: N/A    Number of Children: N/A  . Years of Education: N/A   Occupational History  . Not on file.   Social History Main Topics  . Smoking status: Never Smoker   . Smokeless tobacco: Never Used  . Alcohol Use: Not on file  . Drug Use: Yes     Comment: hx cocaine use  . Sexual Activity: Not on file   Other Topics Concern  . 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  Past Medical History  Diagnosis Date  . Diabetes mellitus without complication   . Chronic systolic heart failure     a) ECHO [facility] 02/2014): EF <15%, multiple apical thrombi, RV mod enlarged, mod MR b. RHC (02/21/14) at [facility]: RA 16, RV 64/9 (15), PA 64/32 (42), PCWP: 24, CI: 1.4  . HTN (hypertension)   . HLD (hyperlipidemia)   . CKD (chronic kidney disease), stage III   . Apical mural thrombus   . Mitral regurgitation   . Drug use     cocaine  . Atrial fibrillation, chronic     Current Outpatient Prescriptions  Medication Sig Dispense  Refill  . amiodarone (PACERONE) 200 MG tablet Take 200 mg by mouth daily.    Marland Kitchen. warfarin (COUMADIN) 4 MG tablet Take 0.5 tablets (2 mg total) by mouth daily. 45 tablet 6  . atorvastatin (LIPITOR) 20 MG tablet Take 20 mg by mouth at bedtime.     No current facility-administered medications for this encounter.    Filed Vitals:   07/26/14 1502  BP: 112/66  Pulse: 95  Weight: 106.709 kg (235 lb 4 oz)  SpO2: 98%    PHYSICAL EXAM: General: NAD  HEENT: normal  Neck: supple. JVP 9-10 Carotids 2+ bilat; no bruits. No lymphadenopathy or thryomegaly appreciated.  Cor: PMI laterally displaced. Regular rate & rhythm. 2/6 MR +s3  Lungs: clear Abdomen: soft, nontender, nondistended. No hepatosplenomegaly. No bruits  or masses. Good bowel sounds.  Extremities: no cyanosis, clubbing, rash, 3+ R > 1-2 + L edema R wrist AVF with +thrill Neuro: alert & orientedx3, cranial nerves grossly intact. moves all 4 extremities w/o difficulty. Affect pleasant  ASSESSMENT & PLAN: 1. Chronic systolic HF due to NICM EF < 15%      --previously on dobutamine but off due to HD     --remains NYHA III-IIIB Volume status managed by HD. He is volume overloaded in setting of copious fluid intake. Volume status controlled by HD. Now getting HD 4x/week. BP too soft to add any other meds.     --Long talk about need for better compliance if he ever is to be a candidate for heart-kidney tx.    -  Given Class IV symptoms not candidate for ICD.  2. ESRD on HD 3. DM2    - Counseled on need to check sugars more 4. Cocaine abuse in remission 4-5 months  5. PAF on amio  - Continue on coumadin. Will send back to coumadin clinic 6. H/o VT 7. H/o LV thrombi   Gildardo CrankerHess, Bryan 3:20 PM   Patient seen and examined with Dr. Paulina FusiHess. We discussed all aspects of the encounter. I agree with the assessment and plan as stated above. I have edited the note with my changes.   Total time spent 45 minutes. Over half that time spent discussing above.   [place] Oborn,MD 3:50 PM

## 2014-07-26 NOTE — Addendum Note (Signed)
Encounter addended by: Theresia Bough, CMA on: 07/26/2014  4:27 PM<BR>     Documentation filed: Patient Instructions Section

## 2014-07-26 NOTE — Patient Instructions (Signed)
You have been referred to      [facility]     [address]     [place], [place]  [phone]  Your physician recommends that you schedule a follow-up appointment in: 4 weeks  Do the following things EVERYDAY: 1) Weigh yourself in the morning before breakfast. Write it down and keep it in a log. 2) Take your medicines as prescribed 3) Eat low salt foods-Limit salt (sodium) to 2000 mg per day.  4) Stay as active as you can everyday 5) Limit all fluids for the day to less than 2 liters 6)

## 2014-08-09 ENCOUNTER — Ambulatory Visit (INDEPENDENT_AMBULATORY_CARE_PROVIDER_SITE_OTHER): Payer: Medicare HMO | Admitting: *Deleted

## 2014-08-09 ENCOUNTER — Telehealth: Payer: Self-pay | Admitting: *Deleted

## 2014-08-09 DIAGNOSIS — I480 Paroxysmal atrial fibrillation: Secondary | ICD-10-CM

## 2014-08-09 DIAGNOSIS — I4891 Unspecified atrial fibrillation: Secondary | ICD-10-CM | POA: Insufficient documentation

## 2014-08-09 DIAGNOSIS — Z5181 Encounter for therapeutic drug level monitoring: Secondary | ICD-10-CM | POA: Insufficient documentation

## 2014-08-09 LAB — POCT INR: INR: 1.2

## 2014-08-09 NOTE — Telephone Encounter (Signed)
-----   Message from Dolores Patty, MD sent at 08/09/2014  3:15 PM EST ----- Thanks. We will follow up with him.   ----- Message -----    From: Jeannine Kitten, RN    Sent: 08/09/2014  11:41 AM      To: Dolores Patty, MD  Saw this pt in the coumadin clinic for first time today . Was discharged from [facility] 06/28/2014 on Amiodarone and he states the only medication he has taken since discharge is Warfarin and Allopurinol . States he forgot to inform his Doctor on his office visit  Just wanted to let you know . He has an appt to be seen in coumadin clinic next Thursday.  Thanks Lelon Perla RN

## 2014-08-09 NOTE — Patient Instructions (Signed)

## 2014-08-09 NOTE — Telephone Encounter (Signed)
Message from Dr Gala Romney documented in chart dated 08/09/2014.

## 2014-08-20 ENCOUNTER — Encounter: Payer: Self-pay | Admitting: Vascular Surgery

## 2014-08-21 ENCOUNTER — Ambulatory Visit (INDEPENDENT_AMBULATORY_CARE_PROVIDER_SITE_OTHER): Payer: Self-pay | Admitting: Vascular Surgery

## 2014-08-21 ENCOUNTER — Ambulatory Visit (HOSPITAL_COMMUNITY)
Admit: 2014-08-21 | Discharge: 2014-08-21 | Disposition: A | Discharge status: Home / Self Care | Payer: Medicare HMO | Source: Ambulatory Visit | Attending: Vascular Surgery | Admitting: Vascular Surgery

## 2014-08-21 ENCOUNTER — Encounter: Payer: Self-pay | Admitting: Vascular Surgery

## 2014-08-21 VITALS — BP 106/77 | HR 103 | Resp 18 | Ht 72.0 in | Wt 229.0 lb

## 2014-08-21 DIAGNOSIS — Z4931 Encounter for adequacy testing for hemodialysis: Secondary | ICD-10-CM | POA: Diagnosis present

## 2014-08-21 DIAGNOSIS — N186 End stage renal disease: Secondary | ICD-10-CM

## 2014-08-21 NOTE — Progress Notes (Signed)
Subjective:     Patient ID: [name], male   DOB: Oct 13, 1950, 64 y.o.   MRN: [id number]  HPI this 64 year old male with end-stage renal disease is on hemodialysis Monday Wednesday and Friday. I created a right radial-cephalic AV fistula 06/27/2014. He is evaluated today for maturation of the fistula. It has not been utilized yet. He has a catheter in his right IJ.  Past Medical History  Diagnosis Date  . Diabetes mellitus without complication   . Chronic systolic heart failure     a) ECHO [facility] 02/2014): EF <15%, multiple apical thrombi, RV mod enlarged, mod MR b. RHC (02/21/14) at [facility]: RA 16, RV 64/9 (15), PA 64/32 (42), PCWP: 24, CI: 1.4  . HTN (hypertension)   . HLD (hyperlipidemia)   . CKD (chronic kidney disease), stage III   . Apical mural thrombus   . Mitral regurgitation   . Drug use     cocaine  . Atrial fibrillation, chronic     History  Substance Use Topics  . Smoking status: Never Smoker   . Smokeless tobacco: Never Used  . Alcohol Use: No    No family history on file.  No Known Allergies  Current outpatient prescriptions: amiodarone (PACERONE) 200 MG tablet, Take 200 mg by mouth daily., Disp: , Rfl: ;  atorvastatin (LIPITOR) 20 MG tablet, Take 20 mg by mouth at bedtime., Disp: , Rfl: ;  warfarin (COUMADIN) 4 MG tablet, Take 0.5 tablets (2 mg total) by mouth daily. (Patient not taking: Reported on 08/21/2014), Disp: 45 tablet, Rfl: 6  BP 106/77 mmHg  Pulse 103  Resp 18  Ht 6' (1.829 m)  Wt 229 lb (103.874 kg)  BMI 31.05 kg/m2  Body mass index is 31.05 kg/(m^2).           Review of Systems     Objective:   Physical Exam BP 106/77 mmHg  Pulse 103  Resp 18  Ht 6' (1.829 m)  Wt 229 lb (103.874 kg)  BMI 31.05 kg/m2  Gen. well-developed well-nourished male no apparent stress alert and oriented 3 Lungs no rhonchi or wheezing Cardiovascular regular rhythm no murmurs Right upper extremity with palpable pulse and thrill in radial-cephalic AV  fistula. Vein is slightly deep. Bruit is audible at the antecubital area.  Today I ordered a duplex scan of the fistula which I reviewed and interpreted. The size of the fistula is borderline but does dilate 0.36 in the mid to proximal forearm. There is 1 possibly significant branch in the proximal to mid forearm.     Assessment:     Slowly maturing right radial-cephalic AV fistula with borderline size and slightly deep    Plan:     Plan superficialization of fistula and ligation of competing branches on Thursday, January 28. Patient is unavailable to have this performed on the Thursday prior to that date.

## 2014-08-22 ENCOUNTER — Other Ambulatory Visit: Payer: Self-pay

## 2014-08-23 ENCOUNTER — Encounter (HOSPITAL_COMMUNITY): Payer: Self-pay

## 2014-08-23 ENCOUNTER — Encounter: Payer: Self-pay | Admitting: Licensed Clinical Social Worker

## 2014-08-23 ENCOUNTER — Ambulatory Visit (HOSPITAL_COMMUNITY)
Admission: RE | Admit: 2014-08-23 | Discharge: 2014-08-23 | Disposition: A | Discharge status: Home / Self Care | Payer: Medicare HMO | Source: Ambulatory Visit | Attending: Internal Medicine | Admitting: Internal Medicine

## 2014-08-23 VITALS — BP 90/58 | HR 99 | Wt 229.4 lb

## 2014-08-23 DIAGNOSIS — E119 Type 2 diabetes mellitus without complications: Secondary | ICD-10-CM | POA: Diagnosis not present

## 2014-08-23 DIAGNOSIS — N186 End stage renal disease: Secondary | ICD-10-CM

## 2014-08-23 DIAGNOSIS — Z7901 Long term (current) use of anticoagulants: Secondary | ICD-10-CM | POA: Diagnosis not present

## 2014-08-23 DIAGNOSIS — F1410 Cocaine abuse, uncomplicated: Secondary | ICD-10-CM | POA: Insufficient documentation

## 2014-08-23 DIAGNOSIS — I5022 Chronic systolic (congestive) heart failure: Secondary | ICD-10-CM

## 2014-08-23 DIAGNOSIS — I480 Paroxysmal atrial fibrillation: Secondary | ICD-10-CM

## 2014-08-23 LAB — PROTIME-INR
INR: 1.35 (ref 0.00–1.49)
PROTHROMBIN TIME: 16.8 s — AB (ref 11.6–15.2)

## 2014-08-23 MED ORDER — AMIODARONE HCL 200 MG PO TABS: 200.0000 mg | ORAL_TABLET | Freq: Every day | ORAL | Status: AC

## 2014-08-23 NOTE — Progress Notes (Signed)
Patient ID: [name], male   DOB: [birth year], 64 y.o.   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 Youlanda [name] and the HF Clinic at [name].Past medical h/o also notable for CKD with baseline Cr 3.0, cocaine abuse, DM2, AF on amio, VT.   He was admitted over the summer 2015 at [name] with cardiogenic shock requiring IABP. Sent home on dobutamine 53mcg/kg/min. He has not been candidate for advanced therapies due to cocaine abuse and renal failure. Moved to [place] to try to stay off cocaine.   Admitted to [name] 10/30-11/19/15  with respiratory failure and wide complex bradycardia. K 6.8 and Cr 4.6. Goals of care discussed in ER and he deiced to proceed with aggressive care. Developed ESRD. Underwent CVVHD and was diuresed over 30 pounds. We were able to wean the dobutamine and he was able to tolerate several sessions of inpatient intermittent HD with SBP in 80-90 range. He was unable to tolerate any b-blockers and ACE/ARBs were avoided due to renal failure. On 11/18 he underwent placement of R wrist AVF by [name]. D/c weight was 220.Bcx from Nov 1 came back 2/2+ for Bacillus. He was seend by ID who recommended 2 week course of vancomycin and a line holiday. After CVVHD was complete his Trialysis catheter and PICC line were removed for 48 hours and surveillance cultures were drawn which remained negative. TTE showed no evidence of endocarditis. We decided to defer TEE as we did not feel it would alter management unless recurrent bcx were positive.   He returns for follow up. Continues on dialysis 4 days a week. Weight at home 230 pounds.  Denies SOB/PND/Orthopnea. Not having bleeding problems. Not taking insulin or amiodarone.  Drinking lots of fluid. Poor appetite. Denies cocaine abuse in the last several months.    ROS: All systems negative except as listed in HPI,  PMH and Problem List.  SH:  History   Social History  . Marital Status: Divorced    Spouse Name: N/A    Number of Children: N/A  . Years of Education: N/A   Occupational History  . Not on file.   Social History Main Topics  . Smoking status: Never Smoker   . Smokeless tobacco: Never Used  . Alcohol Use: No  . Drug Use: Yes    Special: Cocaine     Comment: hx cocaine use  . Sexual Activity: Not on file   Other Topics Concern  . 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  Past Medical History  Diagnosis Date  . Diabetes mellitus without complication   . Chronic systolic heart failure     a) ECHO [name] 02/2014): EF <15%, multiple apical thrombi, RV mod enlarged, mod MR b. RHC (02/21/14) at [name]: RA 16, RV 64/9 (15), PA 64/32 (42), PCWP: 24, CI: 1.4  . HTN (hypertension)   . HLD (hyperlipidemia)   . CKD (chronic kidney disease), stage III   . Apical mural thrombus   . Mitral regurgitation   . Drug use     cocaine  . Atrial fibrillation, chronic     Current Outpatient Prescriptions  Medication Sig Dispense Refill  . allopurinol (ZYLOPRIM) 100 MG tablet Take 100 mg by mouth daily.    . calcium acetate (PHOSLO) 667 MG capsule Take  667 mg by mouth 3 (three) times daily with meals.    . warfarin (COUMADIN) 4 MG tablet Take 0.5 tablets (2 mg total) by mouth daily. 45 tablet 6  . amiodarone (PACERONE) 200 MG tablet Take 200 mg by mouth daily.    Marland Kitchen atorvastatin (LIPITOR) 20 MG tablet Take 20 mg by mouth at bedtime.     No current facility-administered medications for this encounter.    Filed Vitals:   08/23/14 1103  BP: 90/58  Pulse: 99  Weight: 229 lb 6.4 oz (104.055 kg)  SpO2: 97%    PHYSICAL EXAM: General: NAD  HEENT: normal  Neck: supple. JVP 7-8. Carotids 2+ bilat; no bruits. No lymphadenopathy or thryomegaly appreciated.  Cor: PMI laterally displaced. Regular rate & rhythm. 2/6 MR +s3  Lungs:  clear Abdomen: soft, nontender, nondistended. No hepatosplenomegaly. No bruits or masses. Good bowel sounds.  Extremities: no cyanosis, clubbing. R and LLE 1-2 + edema.   Neuro: alert & orientedx3, cranial nerves grossly intact. moves all 4 extremities w/o difficulty. Affect pleasant  ASSESSMENT & PLAN: 1. Chronic systolic HF due to NICM EF < 15%      --previously on dobutamine but off due to HD     --remains NYHA II-III much improved.  Volume status managed by HD. Volume status elevated. Now getting HD 4x/week. BP too soft to add any other meds.   Long talk about need for better compliance if he ever is to be a candidate for heart-kidney tx.  2. ESRD on HD 4 days a week. - says BP get low in HD. Saw Dr Hart [name] yesterday for AV fistula 3. DM2 -  - Counseled on need to check sugars more. No taking any insulin. Has not had insulin over 2 months. Needs PCP. I have referred to HFSW today.  4. Cocaine abuse-  Has not used in several months.   5. PAF - was on amiodarone. EKG now. Today I will restart amiodarone 200 mg daily . Continue on coumadin. Will send back to coumadin clinic 6. H/o VT 7. H/o LV thrombi   Follow up in 4 weeks   [name]  12:02 PM   Patient seen and examined with [name] Becket, NP. We discussed all aspects of the encounter. I agree with the assessment and plan as stated above.   He remains stable from HF perspective with NYHA III symptoms. Volume status well managed with HD. Unfortunately BP remains too soft to titrate meds. Has not been compliant with amiodarone. Will restart. Continue coumadin. Compliance too poor to permit further evaluation for heart-kidney Tx at this time.   [name] 7:49 PM

## 2014-08-23 NOTE — Progress Notes (Signed)
CSW referred to assist with obtaining a PCP. Patient reports he recently moved from [place] and is in need of a PCP. CSW contacted [facility] and appointment made for February 22 at 2pm. Patient verbalizes understanding of needed items for appointment and denies any other concerns at this time. Lasandra Beech, LCSW [phone]

## 2014-08-23 NOTE — Patient Instructions (Signed)
RESTART Amiodarone 200 mg daily Labs today  Your physician recommends that you schedule a follow-up appointment in: 6 weeks  Do the following things EVERYDAY: 1) Weigh yourself in the morning before breakfast. Write it down and keep it in a log. 2) Take your medicines as prescribed 3) Eat low salt foods-Limit salt (sodium) to 2000 mg per day.  4) Stay as active as you can everyday 5) Limit all fluids for the day to less than 2 liters 6)

## 2014-08-24 ENCOUNTER — Ambulatory Visit (INDEPENDENT_AMBULATORY_CARE_PROVIDER_SITE_OTHER): Payer: Medicare HMO | Admitting: Cardiovascular Disease

## 2014-08-24 ENCOUNTER — Telehealth (HOSPITAL_COMMUNITY): Payer: Self-pay | Admitting: Vascular Surgery

## 2014-08-24 DIAGNOSIS — I480 Paroxysmal atrial fibrillation: Secondary | ICD-10-CM

## 2014-08-24 DIAGNOSIS — I4891 Unspecified atrial fibrillation: Secondary | ICD-10-CM

## 2014-08-24 DIAGNOSIS — Z5181 Encounter for therapeutic drug level monitoring: Secondary | ICD-10-CM

## 2014-08-24 LAB — NICOTINE/COTININE METABOLITES: Cotinine: <10 ng/mL

## 2014-08-24 NOTE — Telephone Encounter (Signed)
[facility] called pt is scheduled for surgery 1/28, they need him to come off his blood thinner 5 days prior, please advise

## 2014-08-24 NOTE — Telephone Encounter (Signed)
Will send to Dr Bensimhon for review 

## 2014-08-27 NOTE — Telephone Encounter (Signed)
Ok to hold. Restart as soon as possible after procedure

## 2014-08-28 NOTE — Telephone Encounter (Signed)
Left mess on Stephanie's ID VM ok for pt to hold coumadin and note faxed to [facility]

## 2014-09-11 ENCOUNTER — Encounter (HOSPITAL_COMMUNITY): Payer: Self-pay | Admitting: Pharmacy Technician

## 2014-09-12 ENCOUNTER — Encounter (HOSPITAL_COMMUNITY): Payer: Self-pay | Admitting: *Deleted

## 2014-09-12 MED ORDER — CHLORHEXIDINE GLUCONATE CLOTH 2 % EX PADS: 6.0000 | MEDICATED_PAD | Freq: Once | CUTANEOUS | Status: DC

## 2014-09-12 MED ORDER — CEFUROXIME SODIUM 1.5 G IJ SOLR
1.5000 g | INTRAMUSCULAR | Status: DC
  Filled 2014-09-12: qty 1.5

## 2014-09-12 MED ORDER — SODIUM CHLORIDE 0.9 % IV SOLN: INTRAVENOUS | Status: DC

## 2014-09-12 NOTE — Progress Notes (Signed)
Pt states he was not told to stop his Warfarin. Last dose was this am (09/12/14). There is a note in EPIC that states he needed to stop 5 days prior to surgery. Called surgeon on call which happened to be Dr. Hart Rochester and notified him that pt has not stopped his Warfarin. He states to have PT, PTT drawn as soon as pt arrives to get results.    Pt states he has been out of his diabetic meds for at least 30 days. States the last time he checked his blood sugar it was running between 180-190, but he hasn't checked it in a while due to not having anymore supplies. States that dialysis does not check it either.

## 2014-09-12 NOTE — Progress Notes (Signed)
   09/12/14 1801  OBSTRUCTIVE SLEEP APNEA  Have you ever been diagnosed with sleep apnea through a sleep study? No  Do you snore loudly (loud enough to be heard through closed doors)?  0  Do you often feel tired, fatigued, or sleepy during the daytime? 0  Has anyone observed you stop breathing during your sleep? 0  Do you have, or are you being treated for high blood pressure? 1  BMI more than 35 kg/m2? 0  Age over 66 years old? 1  Neck circumference greater than 40 cm/16 inches? 1  Gender: 1  Obstructive Sleep Apnea Score 4  Score 4 or greater  Results sent to PCP

## 2014-09-13 ENCOUNTER — Ambulatory Visit (HOSPITAL_COMMUNITY): Payer: Medicare HMO | Admitting: Anesthesiology

## 2014-09-13 ENCOUNTER — Emergency Department (HOSPITAL_COMMUNITY)
Admission: RE | Admit: 2014-09-13 | Discharge: 2014-09-13 | Disposition: A | Discharge status: Home / Self Care | Payer: Medicare HMO | Source: Ambulatory Visit | Attending: Emergency Medicine | Admitting: Emergency Medicine

## 2014-09-13 ENCOUNTER — Encounter (HOSPITAL_COMMUNITY)
Admission: RE | Disposition: A | Discharge status: Home / Self Care | Payer: Self-pay | Source: Ambulatory Visit | Attending: Emergency Medicine

## 2014-09-13 ENCOUNTER — Encounter (HOSPITAL_COMMUNITY): Payer: Self-pay | Admitting: *Deleted

## 2014-09-13 DIAGNOSIS — Z79899 Other long term (current) drug therapy: Secondary | ICD-10-CM | POA: Insufficient documentation

## 2014-09-13 DIAGNOSIS — Z8739 Personal history of other diseases of the musculoskeletal system and connective tissue: Secondary | ICD-10-CM | POA: Insufficient documentation

## 2014-09-13 DIAGNOSIS — Z7901 Long term (current) use of anticoagulants: Secondary | ICD-10-CM | POA: Diagnosis not present

## 2014-09-13 DIAGNOSIS — Z8701 Personal history of pneumonia (recurrent): Secondary | ICD-10-CM | POA: Diagnosis not present

## 2014-09-13 DIAGNOSIS — Z862 Personal history of diseases of the blood and blood-forming organs and certain disorders involving the immune mechanism: Secondary | ICD-10-CM | POA: Insufficient documentation

## 2014-09-13 DIAGNOSIS — E118 Type 2 diabetes mellitus with unspecified complications: Secondary | ICD-10-CM

## 2014-09-13 DIAGNOSIS — I5023 Acute on chronic systolic (congestive) heart failure: Secondary | ICD-10-CM

## 2014-09-13 DIAGNOSIS — I5022 Chronic systolic (congestive) heart failure: Secondary | ICD-10-CM | POA: Diagnosis not present

## 2014-09-13 DIAGNOSIS — E1165 Type 2 diabetes mellitus with hyperglycemia: Secondary | ICD-10-CM | POA: Insufficient documentation

## 2014-09-13 DIAGNOSIS — N183 Chronic kidney disease, stage 3 (moderate): Secondary | ICD-10-CM | POA: Insufficient documentation

## 2014-09-13 DIAGNOSIS — N186 End stage renal disease: Secondary | ICD-10-CM

## 2014-09-13 DIAGNOSIS — I129 Hypertensive chronic kidney disease with stage 1 through stage 4 chronic kidney disease, or unspecified chronic kidney disease: Secondary | ICD-10-CM | POA: Insufficient documentation

## 2014-09-13 DIAGNOSIS — N19 Unspecified kidney failure: Secondary | ICD-10-CM

## 2014-09-13 DIAGNOSIS — N179 Acute kidney failure, unspecified: Secondary | ICD-10-CM

## 2014-09-13 DIAGNOSIS — N189 Chronic kidney disease, unspecified: Secondary | ICD-10-CM

## 2014-09-13 DIAGNOSIS — Z992 Dependence on renal dialysis: Secondary | ICD-10-CM

## 2014-09-13 DIAGNOSIS — R739 Hyperglycemia, unspecified: Secondary | ICD-10-CM

## 2014-09-13 DIAGNOSIS — M7989 Other specified soft tissue disorders: Secondary | ICD-10-CM

## 2014-09-13 HISTORY — DX: Pneumonia, unspecified organism: J18.9

## 2014-09-13 HISTORY — DX: Unspecified osteoarthritis, unspecified site: M19.90

## 2014-09-13 LAB — CBC WITH DIFFERENTIAL/PLATELET
Basophils Absolute: 0 10*3/uL (ref 0.0–0.1)
Basophils Relative: 1 % (ref 0–1)
Eosinophils Absolute: 0 10*3/uL (ref 0.0–0.7)
Eosinophils Relative: 1 % (ref 0–5)
HCT: 34.6 % — ABNORMAL LOW (ref 39.0–52.0)
Hemoglobin: 11.7 g/dL — ABNORMAL LOW (ref 13.0–17.0)
Lymphocytes Relative: 24 % (ref 12–46)
Lymphs Abs: 1.2 10*3/uL (ref 0.7–4.0)
MCH: 27.3 pg (ref 26.0–34.0)
MCHC: 33.8 g/dL (ref 30.0–36.0)
MCV: 80.7 fL (ref 78.0–100.0)
MONO ABS: 0.8 10*3/uL (ref 0.1–1.0)
MONOS PCT: 16 % — AB (ref 3–12)
NEUTROS PCT: 59 % (ref 43–77)
Neutro Abs: 3 10*3/uL (ref 1.7–7.7)
PLATELETS: 158 10*3/uL (ref 150–400)
RBC: 4.29 MIL/uL (ref 4.22–5.81)
RDW: 16.6 % — AB (ref 11.5–15.5)
WBC: 5.1 10*3/uL (ref 4.0–10.5)

## 2014-09-13 LAB — POCT I-STAT 4, (NA,K, GLUC, HGB,HCT)
GLUCOSE: 501 mg/dL — AB (ref 70–99)
HCT: 47 % (ref 39.0–52.0)
Hemoglobin: 16 g/dL (ref 13.0–17.0)
Potassium: 3.8 mmol/L (ref 3.5–5.1)
SODIUM: 129 mmol/L — AB (ref 135–145)

## 2014-09-13 LAB — URINE MICROSCOPIC-ADD ON

## 2014-09-13 LAB — BASIC METABOLIC PANEL
Anion gap: 13 (ref 5–15)
BUN: 31 mg/dL — ABNORMAL HIGH (ref 6–23)
CO2: 22 mmol/L (ref 19–32)
CREATININE: 4.94 mg/dL — AB (ref 0.50–1.35)
Calcium: 8.5 mg/dL (ref 8.4–10.5)
Chloride: 92 mmol/L — ABNORMAL LOW (ref 96–112)
GFR calc non Af Amer: 11 mL/min — ABNORMAL LOW (ref >90)
GFR, EST AFRICAN AMERICAN: 13 mL/min — AB (ref >90)
GLUCOSE: 458 mg/dL — AB (ref 70–99)
Potassium: 3.8 mmol/L (ref 3.5–5.1)
Sodium: 127 mmol/L — ABNORMAL LOW (ref 135–145)

## 2014-09-13 LAB — URINALYSIS, ROUTINE W REFLEX MICROSCOPIC
Glucose, UA: 100 mg/dL — AB
HGB URINE DIPSTICK: NEGATIVE
Ketones, ur: 15 mg/dL — AB
NITRITE: POSITIVE — AB
PH: 5 (ref 5.0–8.0)
PROTEIN: 30 mg/dL — AB
SPECIFIC GRAVITY, URINE: 1.025 (ref 1.005–1.030)
Urobilinogen, UA: 2 mg/dL — ABNORMAL HIGH (ref 0.0–1.0)

## 2014-09-13 LAB — PROTIME-INR
INR: 1.41 (ref 0.00–1.49)
Prothrombin Time: 17.4 seconds — ABNORMAL HIGH (ref 11.6–15.2)

## 2014-09-13 LAB — APTT: aPTT: 39 seconds — ABNORMAL HIGH (ref 24–37)

## 2014-09-13 LAB — CBG MONITORING, ED
GLUCOSE-CAPILLARY: 376 mg/dL — AB (ref 70–99)
GLUCOSE-CAPILLARY: 342 mg/dL — AB (ref 70–99)
Glucose-Capillary: 443 mg/dL — ABNORMAL HIGH (ref 70–99)

## 2014-09-13 LAB — RAPID URINE DRUG SCREEN, HOSP PERFORMED
Amphetamines: NOT DETECTED
BARBITURATES: NOT DETECTED
BENZODIAZEPINES: NOT DETECTED
Cocaine: NOT DETECTED
Opiates: NOT DETECTED
Tetrahydrocannabinol: NOT DETECTED

## 2014-09-13 SURGERY — FISTULA SUPERFICIALIZATION
Anesthesia: Monitor Anesthesia Care | Laterality: Right

## 2014-09-13 MED ORDER — INSULIN ASPART 100 UNIT/ML ~~LOC~~ SOLN
10.0000 [IU] | Freq: Once | SUBCUTANEOUS | Status: AC
  Administered 2014-09-13: 10 [IU] via INTRAVENOUS
  Filled 2014-09-13: qty 1

## 2014-09-13 MED ORDER — ARTIFICIAL TEARS OP OINT
TOPICAL_OINTMENT | OPHTHALMIC | Status: AC
  Filled 2014-09-13: qty 3.5

## 2014-09-13 MED ORDER — PROPOFOL 10 MG/ML IV BOLUS
INTRAVENOUS | Status: AC
  Filled 2014-09-13: qty 20

## 2014-09-13 MED ORDER — INSULIN NPH (HUMAN) (ISOPHANE) 100 UNIT/ML ~~LOC~~ SUSP: 12.0000 [IU] | Freq: Every day | SUBCUTANEOUS | Status: DC

## 2014-09-13 MED ORDER — EPHEDRINE SULFATE 50 MG/ML IJ SOLN
INTRAMUSCULAR | Status: AC
  Filled 2014-09-13: qty 1

## 2014-09-13 MED ORDER — PHENYLEPHRINE 40 MCG/ML (10ML) SYRINGE FOR IV PUSH (FOR BLOOD PRESSURE SUPPORT)
PREFILLED_SYRINGE | INTRAVENOUS | Status: AC
  Filled 2014-09-13: qty 10

## 2014-09-13 MED ORDER — SODIUM CHLORIDE 0.9 % IV BOLUS (SEPSIS)
500.0000 mL | Freq: Once | INTRAVENOUS | Status: AC
  Administered 2014-09-13: 500 mL via INTRAVENOUS

## 2014-09-13 MED ORDER — SUCCINYLCHOLINE CHLORIDE 20 MG/ML IJ SOLN
INTRAMUSCULAR | Status: AC
  Filled 2014-09-13: qty 1

## 2014-09-13 MED ORDER — LIDOCAINE HCL (CARDIAC) 20 MG/ML IV SOLN
INTRAVENOUS | Status: AC
  Filled 2014-09-13: qty 5

## 2014-09-13 MED ORDER — INSULIN REGULAR HUMAN 100 UNIT/ML IJ SOLN: 6.0000 [IU] | Freq: Three times a day (TID) | INTRAMUSCULAR | Status: DC

## 2014-09-13 MED ORDER — SODIUM CHLORIDE 0.9 % IJ SOLN
INTRAMUSCULAR | Status: AC
  Filled 2014-09-13: qty 10

## 2014-09-13 MED ORDER — LIDOCAINE-EPINEPHRINE (PF) 1 %-1:200000 IJ SOLN
INTRAMUSCULAR | Status: AC
  Filled 2014-09-13: qty 10

## 2014-09-13 MED ORDER — FENTANYL CITRATE 0.05 MG/ML IJ SOLN
INTRAMUSCULAR | Status: AC
  Filled 2014-09-13: qty 5

## 2014-09-13 MED ORDER — ROCURONIUM BROMIDE 50 MG/5ML IV SOLN
INTRAVENOUS | Status: AC
  Filled 2014-09-13: qty 1

## 2014-09-13 MED ORDER — ONDANSETRON HCL 4 MG/2ML IJ SOLN
INTRAMUSCULAR | Status: AC
  Filled 2014-09-13: qty 2

## 2014-09-13 MED ORDER — MIDAZOLAM HCL 2 MG/2ML IJ SOLN
INTRAMUSCULAR | Status: AC
  Filled 2014-09-13: qty 2

## 2014-09-13 SURGICAL SUPPLY — 24 items
CANISTER SUCTION 2500CC (MISCELLANEOUS) ×3 IMPLANT
CLIP TI MEDIUM 6 (CLIP) ×3 IMPLANT
CLIP TI WIDE RED SMALL 6 (CLIP) ×3 IMPLANT
COVER PROBE W GEL 5X96 (DRAPES) IMPLANT
COVER SURGICAL LIGHT HANDLE (MISCELLANEOUS) ×3 IMPLANT
ELECT REM PT RETURN 9FT ADLT (ELECTROSURGICAL) ×3
ELECTRODE REM PT RTRN 9FT ADLT (ELECTROSURGICAL) ×1 IMPLANT
GAUZE SPONGE 4X4 12PLY STRL (GAUZE/BANDAGES/DRESSINGS) ×3 IMPLANT
GEL ULTRASOUND 20GR AQUASONIC (MISCELLANEOUS) IMPLANT
GLOVE SS BIOGEL STRL SZ 7 (GLOVE) ×1 IMPLANT
GLOVE SUPERSENSE BIOGEL SZ 7 (GLOVE) ×2
GOWN STRL REUS W/ TWL LRG LVL3 (GOWN DISPOSABLE) ×3 IMPLANT
GOWN STRL REUS W/TWL LRG LVL3 (GOWN DISPOSABLE) ×6
KIT BASIN OR (CUSTOM PROCEDURE TRAY) ×3 IMPLANT
KIT ROOM TURNOVER OR (KITS) ×3 IMPLANT
LIQUID BAND (GAUZE/BANDAGES/DRESSINGS) ×3 IMPLANT
NS IRRIG 1000ML POUR BTL (IV SOLUTION) ×3 IMPLANT
PACK CV ACCESS (CUSTOM PROCEDURE TRAY) ×3 IMPLANT
PAD ARMBOARD 7.5X6 YLW CONV (MISCELLANEOUS) ×6 IMPLANT
SUT PROLENE 6 0 BV (SUTURE) ×3 IMPLANT
SUT VIC AB 3-0 SH 27 (SUTURE) ×2
SUT VIC AB 3-0 SH 27X BRD (SUTURE) ×1 IMPLANT
UNDERPAD 30X30 INCONTINENT (UNDERPADS AND DIAPERS) ×3 IMPLANT
WATER STERILE IRR 1000ML POUR (IV SOLUTION) ×3 IMPLANT

## 2014-09-13 NOTE — ED Notes (Signed)
Patient transported to Ultrasound 

## 2014-09-13 NOTE — ED Notes (Signed)
CBG = 376 

## 2014-09-13 NOTE — ED Notes (Signed)
  CBG 342  

## 2014-09-13 NOTE — ED Provider Notes (Addendum)
CSN: [id number]     Arrival date & time 09/13/14  0735 History   First MD Initiated Contact with Patient 09/13/14 [id number]     Chief Complaint  Patient presents with  . Hyperglycemia     (Consider location/radiation/quality/duration/timing/severity/associated sxs/prior Treatment) HPI    64 year old male presents today from surgery with reports that he was scheduled to have a dialysis graft placed today but on initial evaluation preoperatively this a.m. his blood sugar was noted to be 500. He was sent down here for further evaluation and treatment. He states that he has not been taking his insulin for the past 6 months since he moved here from [place]. He has intermittently checked his blood sugar and states that it has been 120 to 1:30. He has not had anything to eat or drink since midnight last night as he was nothing by mouth preoperatively. He denies any precipitating hyperglycemia/DKA events and has been in his normal state of health. Specifically he denies any headache, vision change, neck pain, chest pain, change in dyspnea, nausea, vomiting, diarrhea, or change in urine output, fever, or chills. He states that he was taking insulin 7030 approximately 7 units in the morning, 4 units midday, and 8 units in the evening. He denies that he had a sliding scale. He has been an insulin-dependent diabetic since age 5. This previous prescription was in [place]. He has not had a primary care physician here in [place] but has been followed by cardiology and renal. She is substance abuse but denies any substance abuse for the past year.  Right lower extremity is noted to be swollen but he denies any tenderness or dyspnea from baseline. Denies history of DVT and is anticoagulated. He has not been on any long trips but is regularly immobilized for dialysis. Past Medical History  Diagnosis Date  . Diabetes mellitus without complication   . Chronic systolic heart failure     a) ECHO [facility] 02/2014): EF <15%,  multiple apical thrombi, RV mod enlarged, mod MR b. RHC (02/21/14) at [facility]: RA 16, RV 64/9 (15), PA 64/32 (42), PCWP: 24, CI: 1.4  . HTN (hypertension)   . HLD (hyperlipidemia)   . CKD (chronic kidney disease), stage III   . Apical mural thrombus   . Mitral regurgitation   . Drug use     cocaine  . Atrial fibrillation, chronic   . Pneumonia   . Arthritis   . Anemia    Past Surgical History  Procedure Laterality Date  . Av fistula placement Right 06/27/2014    Procedure: ARTERIOVENOUS (AV) FISTULA CREATION VERSUS GRAFT INSERTION;  Surgeon: Pryor Ochoa, MD;  Location: [facility] OR;  Service: Vascular;  Laterality: Right;  . Colonoscopy    . Insertion of dialysis catheter  12/15   History reviewed. No pertinent family history. History  Substance Use Topics  . Smoking status: Never Smoker   . Smokeless tobacco: Never Used  . Alcohol Use: No     Comment: last usuage 1 yr. ago    Review of Systems  Constitutional: Negative.   HENT: Negative.   Eyes: Negative.   Respiratory: Positive for shortness of breath.   Cardiovascular: Positive for leg swelling.  Gastrointestinal: Negative.   Genitourinary: Negative.   Allergic/Immunologic: Negative.   Neurological: Positive for dizziness.  Hematological: Bruises/bleeds easily.  Psychiatric/Behavioral: Negative.   All other systems reviewed and are negative.     Allergies  Review of patient's allergies indicates no known allergies.  Home Medications  Prior to Admission medications   Medication Sig Start Date End Date Taking? Authorizing Provider  acetaminophen (TYLENOL) 500 MG tablet Take 500 mg by mouth every 6 (six) hours as needed for moderate pain.   Yes Historical Provider, MD  allopurinol (ZYLOPRIM) 100 MG tablet Take 100 mg by mouth daily.   Yes Historical Provider, MD  amiodarone (PACERONE) 200 MG tablet Take 1 tablet (200 mg total) by mouth daily. 08/23/14  Yes Amy D Clegg, NP  calcium acetate (PHOSLO) 667 MG capsule Take  667 mg by mouth daily.    Yes Historical Provider, MD  warfarin (COUMADIN) 4 MG tablet Take 0.5 tablets (2 mg total) by mouth daily. 06/28/14  Yes Amy D Clegg, NP   BP 125/103 mmHg  Pulse 96  Temp(Src) 98.7 F (37.1 C) (Oral)  Resp 25  Ht 6' (1.829 m)  Wt 229 lb (103.874 kg)  BMI 31.05 kg/m2  SpO2 99% Physical Exam  Constitutional: He is oriented to person, place, and time. He appears well-developed and well-nourished.  HENT:  Head: Normocephalic and atraumatic.  Eyes: Conjunctivae and EOM are normal. Pupils are equal, round, and reactive to light.  Neck: Normal range of motion. Neck supple.  Cardiovascular: Normal rate.  An irregularly irregular rhythm present.  Abdominal: Soft. Bowel sounds are normal. He exhibits no distension. There is no tenderness. There is no rebound and no guarding.  Musculoskeletal: Normal range of motion. He exhibits edema. He exhibits no tenderness.  Neurological: He is alert and oriented to person, place, and time. He has normal reflexes.  Skin: Skin is warm and dry.  Psychiatric: He has a normal mood and affect. His behavior is normal. Judgment and thought content normal.  Nursing note and vitals reviewed.   ED Course  Procedures (including critical care time) Labs Review Labs Reviewed  PROTIME-INR - Abnormal; Notable for the following:    Prothrombin Time 17.4 (*)    All other components within normal limits  APTT - Abnormal; Notable for the following:    aPTT 39 (*)    All other components within normal limits  POCT I-STAT 4, (NA,K, GLUC, HGB,HCT) - Abnormal; Notable for the following:    Sodium 129 (*)    Glucose, Bld 501 (*)    All other components within normal limits  CBG MONITORING, ED - Abnormal; Notable for the following:    Glucose-Capillary 443 (*)    All other components within normal limits    Imaging Review No results found.   EKG Interpretation   Date/Time:  Thursday September 13 2014 07:50:32 EST Ventricular Rate:   95 PR Interval:  174 QRS Duration: 132 QT Interval:  462 QTC Calculation: 581 R Axis:   -71 Text Interpretation:  Normal sinus rhythm Non-specific intra-ventricular  conduction delay Nonspecific T wave abnormality, improved in v5 v6  Confirmed by Beonka Amesquita MD, Duwayne Heck ([id number]) on 09/13/2014 8:43:50 AM      MDM   Final diagnoses:  Hyperglycemia  Diabetes mellitus with complication  Renal failure  Chronic anticoagulation   64 year old male with insulin dependent diabetes who presents with hyperglycemia after several months of noncompliance with insulin. He has no evidence of DKA here. He was scheduled to have dialysis graft placed in his right upper extremity. He was not sure of the insulin he was supposed to be taking. The pharmacy tech called multiple pharmacies and eventually his physician's office to obtain the correct doses of insulin. He received 500 mL of fluid here and 10 units of  insulin and repeat insulin is 376. His blood pressures have been systolically 90-125 and diastolically 58-103. He is asymptomatic with this. He does have insurance and I will rewrite for his insulin. He has been followed in the community but does not have primary care physician. He'll be given [place] link to follow-up with primary care and have recheck of his blood sugars. He understands that he needs to contact his surgeon to reschedule surgery.  Patient supposed to be anticoagulated but inr 1.41.  Patient states he is taking as prescribed.  Plan 1.5 dose for three days and recheck in clinic.  1- iddm- noncompliant with meds.  Patient rx'd insulin as previous and referred for primary care.  Advised regarding bs monitoring and need for follow up. SW is assisting with resources 2- renal failure- stable on m,w, fr, sat dialysis 3-anticoagulation- as above      Hilario Quarry, MD 09/13/14 1026  Hilario Quarry, MD 09/13/14 1026

## 2014-09-13 NOTE — ED Notes (Signed)
NAD at this time. Pt is stable and calling his nephew to pick him up. 

## 2014-09-13 NOTE — Discharge Instructions (Signed)
Please take insulin as prescribed and follow and record blood sugars.  Call your cardiologist for recheck of inr on Monday.  Take 6 mg of coumadin each am for the next three days.  Call to schedule surgery again.

## 2014-09-13 NOTE — Progress Notes (Signed)
On arrival to short stay, [facility] lab: glucose 501. Dr. Phoebe Perch and Dr. Hart Rochester notified. Surgery cancelled and pt. Taken to ED via wheelchair.

## 2014-09-13 NOTE — ED Notes (Signed)
Patient is here for surgery.  His cbg was 500 in short stay.  Patient surgery was thus postponed and he was brought down to ED for treatment.  Patient states he has been out of his insulin for 1 month.  Patient was here to have dialysis access placed.  He currently has dialysis catheter in the right upper chest.  Patient last treatment was yesterday.  He is alert.  No sob.  Patient denies pain.  Denies sob.    Patient still makes urine 2-3 times daily.

## 2014-09-13 NOTE — Anesthesia Preprocedure Evaluation (Deleted)
Anesthesia Evaluation  Patient identified by MRN, date of birth, ID band Patient awake    Reviewed: Allergy & Precautions, NPO status , Patient's Chart, lab work & pertinent test results  Airway Mallampati: II   Neck ROM: full    Dental   Pulmonary          Cardiovascular hypertension, +CHF  ECHO [facility] 02/2014): EF <15%, multiple apical thrombi, RV mod enlarged, mod MR    Neuro/Psych    GI/Hepatic   Endo/Other  diabetes, Type 2obese  Renal/GU ESRF and DialysisRenal disease     Musculoskeletal  (+) Arthritis -,   Abdominal   Peds  Hematology   Anesthesia Other Findings   Reproductive/Obstetrics                             Anesthesia Physical Anesthesia Plan  ASA: III  Anesthesia Plan: MAC   Post-op Pain Management:    Induction: Intravenous  Airway Management Planned: Simple Face Mask  Additional Equipment:   Intra-op Plan:   Post-operative Plan:   Informed Consent: I have reviewed the patients History and Physical, chart, labs and discussed the procedure including the risks, benefits and alternatives for the proposed anesthesia with the patient or authorized representative who has indicated his/her understanding and acceptance.     Plan Discussed with: CRNA, Anesthesiologist and Surgeon  Anesthesia Plan Comments:         Anesthesia Quick Evaluation

## 2014-09-13 NOTE — Progress Notes (Signed)
VASCULAR LAB PRELIMINARY  PRELIMINARY  PRELIMINARY  PRELIMINARY  Right lower extremity venous duplex completed.    Preliminary report:  Right:  No evidence of DVT, superficial thrombosis, or Baker's cyst. Doppler signals are pulsatile consistent with fluid overload or congestive heart failure.  Caddie Randle, RVS 09/13/2014, 9:11 AM

## 2014-09-17 NOTE — H&P (Signed)
Patient came to short stay for revision of AV fistula on February 4 and was found to have blood glucose of 501. Patient was sent to emergency department for evaluation by emergency room physician and surgery canceled and will be rescheduled at a later date.

## 2014-09-20 ENCOUNTER — Telehealth: Payer: Self-pay

## 2014-09-20 NOTE — Telephone Encounter (Signed)
Called patient to discuss rescheduling surgery for superfcialization of right arm fistula with Dr. Hart Rochester. Patient's surgery was previously scheduled for 09/13/14 and had to be cancelled due to blood glucose of 500 pre-op. Mr. Streifel states that he would not like to reschedule procedure at this time because he is currently "working on my blood sugar." This nurse asked patient if he has seen his primary care provider since discharging from the ED. Patient states that he does not have a primary doctor and has been "using the insulin that was given to me in the emergency room." Mr. Nowicki states that he plans on finding a primary care provider next Monday and will call when he would like to reschedule procedure. Will continue to follow-up with Mr. Tobar.

## 2014-10-01 ENCOUNTER — Ambulatory Visit: Payer: Medicare HMO | Admitting: Family

## 2014-10-02 ENCOUNTER — Ambulatory Visit: Payer: Medicare HMO | Admitting: Family

## 2014-10-22 ENCOUNTER — Other Ambulatory Visit: Payer: Self-pay

## 2014-10-24 MED ORDER — SODIUM CHLORIDE 0.9 % IV SOLN
INTRAVENOUS | Status: DC
  Administered 2014-10-25: 08:00:00 via INTRAVENOUS

## 2014-10-24 MED ORDER — CHLORHEXIDINE GLUCONATE CLOTH 2 % EX PADS: 6.0000 | MEDICATED_PAD | Freq: Once | CUTANEOUS | Status: DC

## 2014-10-24 MED ORDER — DEXTROSE 5 % IV SOLN
1.5000 g | INTRAVENOUS | Status: AC
  Administered 2014-10-25: 1.5 g via INTRAVENOUS
  Filled 2014-10-24: qty 1.5

## 2014-10-24 NOTE — Progress Notes (Signed)
Pt states he's been using his insulin as directed, has not been checking blood sugar at home. States last dose of Coumadin was 10/23/14.

## 2014-10-25 ENCOUNTER — Encounter (HOSPITAL_COMMUNITY)
Admission: RE | Disposition: A | Discharge status: Home / Self Care | Payer: Self-pay | Source: Ambulatory Visit | Attending: Vascular Surgery

## 2014-10-25 ENCOUNTER — Ambulatory Visit (HOSPITAL_COMMUNITY): Payer: Medicare HMO | Admitting: Anesthesiology

## 2014-10-25 ENCOUNTER — Ambulatory Visit (HOSPITAL_COMMUNITY)
Admission: RE | Admit: 2014-10-25 | Discharge: 2014-10-25 | Disposition: A | Discharge status: Home / Self Care | Payer: Medicare HMO | Source: Ambulatory Visit | Attending: Vascular Surgery | Admitting: Vascular Surgery

## 2014-10-25 ENCOUNTER — Encounter (HOSPITAL_COMMUNITY): Payer: Self-pay | Admitting: Anesthesiology

## 2014-10-25 ENCOUNTER — Other Ambulatory Visit: Payer: Self-pay | Admitting: *Deleted

## 2014-10-25 DIAGNOSIS — Z992 Dependence on renal dialysis: Secondary | ICD-10-CM | POA: Insufficient documentation

## 2014-10-25 DIAGNOSIS — N186 End stage renal disease: Secondary | ICD-10-CM

## 2014-10-25 DIAGNOSIS — I509 Heart failure, unspecified: Secondary | ICD-10-CM | POA: Insufficient documentation

## 2014-10-25 DIAGNOSIS — M1990 Unspecified osteoarthritis, unspecified site: Secondary | ICD-10-CM | POA: Diagnosis not present

## 2014-10-25 DIAGNOSIS — E119 Type 2 diabetes mellitus without complications: Secondary | ICD-10-CM | POA: Diagnosis not present

## 2014-10-25 DIAGNOSIS — I129 Hypertensive chronic kidney disease with stage 1 through stage 4 chronic kidney disease, or unspecified chronic kidney disease: Secondary | ICD-10-CM | POA: Insufficient documentation

## 2014-10-25 DIAGNOSIS — T82898A Other specified complication of vascular prosthetic devices, implants and grafts, initial encounter: Secondary | ICD-10-CM | POA: Diagnosis not present

## 2014-10-25 DIAGNOSIS — Z4931 Encounter for adequacy testing for hemodialysis: Secondary | ICD-10-CM

## 2014-10-25 DIAGNOSIS — Y832 Surgical operation with anastomosis, bypass or graft as the cause of abnormal reaction of the patient, or of later complication, without mention of misadventure at the time of the procedure: Secondary | ICD-10-CM | POA: Insufficient documentation

## 2014-10-25 HISTORY — PX: FISTULA SUPERFICIALIZATION: SHX6341

## 2014-10-25 LAB — POCT I-STAT 4, (NA,K, GLUC, HGB,HCT)
Glucose, Bld: 165 mg/dL — ABNORMAL HIGH (ref 70–99)
Glucose, Bld: 169 mg/dL — ABNORMAL HIGH (ref 70–99)
HCT: 42 % (ref 39.0–52.0)
HCT: 41 % (ref 39.0–52.0)
HEMOGLOBIN: 14.3 g/dL (ref 13.0–17.0)
Hemoglobin: 13.9 g/dL (ref 13.0–17.0)
POTASSIUM: 5.8 mmol/L — AB (ref 3.5–5.1)
POTASSIUM: 4.3 mmol/L (ref 3.5–5.1)
SODIUM: 130 mmol/L — AB (ref 135–145)
SODIUM: 134 mmol/L — AB (ref 135–145)

## 2014-10-25 LAB — GLUCOSE, CAPILLARY
GLUCOSE-CAPILLARY: 147 mg/dL — AB (ref 70–99)
GLUCOSE-CAPILLARY: 166 mg/dL — AB (ref 70–99)
Glucose-Capillary: 151 mg/dL — ABNORMAL HIGH (ref 70–99)

## 2014-10-25 LAB — PROTIME-INR
INR: 1.26 (ref 0.00–1.49)
PROTHROMBIN TIME: 15.9 s — AB (ref 11.6–15.2)

## 2014-10-25 LAB — APTT: aPTT: 38 seconds — ABNORMAL HIGH (ref 24–37)

## 2014-10-25 SURGERY — FISTULA SUPERFICIALIZATION
Anesthesia: Monitor Anesthesia Care | Site: Arm Lower | Laterality: Right

## 2014-10-25 MED ORDER — PHENYLEPHRINE 40 MCG/ML (10ML) SYRINGE FOR IV PUSH (FOR BLOOD PRESSURE SUPPORT)
PREFILLED_SYRINGE | INTRAVENOUS | Status: AC
  Filled 2014-10-25: qty 10

## 2014-10-25 MED ORDER — PROPOFOL INFUSION 10 MG/ML OPTIME
INTRAVENOUS | Status: DC | PRN
  Administered 2014-10-25: 50 ug/kg/min via INTRAVENOUS

## 2014-10-25 MED ORDER — MIDAZOLAM HCL 5 MG/5ML IJ SOLN
INTRAMUSCULAR | Status: DC | PRN
  Administered 2014-10-25 (×2): 1 mg via INTRAVENOUS

## 2014-10-25 MED ORDER — LIDOCAINE HCL (PF) 1 % IJ SOLN
INTRAMUSCULAR | Status: AC
  Filled 2014-10-25: qty 30

## 2014-10-25 MED ORDER — HEPARIN SODIUM (PORCINE) 5000 UNIT/ML IJ SOLN
INTRAMUSCULAR | Status: DC | PRN
  Administered 2014-10-25: 500 mL

## 2014-10-25 MED ORDER — ARTIFICIAL TEARS OP OINT
TOPICAL_OINTMENT | OPHTHALMIC | Status: AC
  Filled 2014-10-25: qty 3.5

## 2014-10-25 MED ORDER — 0.9 % SODIUM CHLORIDE (POUR BTL) OPTIME
TOPICAL | Status: DC | PRN
  Administered 2014-10-25: 1000 mL

## 2014-10-25 MED ORDER — SODIUM CHLORIDE 0.9 % IV SOLN
INTRAVENOUS | Status: DC | PRN
  Administered 2014-10-25: 10:00:00 via INTRAVENOUS

## 2014-10-25 MED ORDER — PROMETHAZINE HCL 25 MG/ML IJ SOLN: 6.2500 mg | INTRAMUSCULAR | Status: DC | PRN

## 2014-10-25 MED ORDER — MIDAZOLAM HCL 2 MG/2ML IJ SOLN
INTRAMUSCULAR | Status: AC
  Filled 2014-10-25: qty 2

## 2014-10-25 MED ORDER — SODIUM CHLORIDE 0.9 % IJ SOLN
INTRAMUSCULAR | Status: AC
  Filled 2014-10-25: qty 10

## 2014-10-25 MED ORDER — HEPARIN SODIUM (PORCINE) 1000 UNIT/ML IJ SOLN
INTRAMUSCULAR | Status: AC
  Filled 2014-10-25: qty 1

## 2014-10-25 MED ORDER — LIDOCAINE HCL (CARDIAC) 20 MG/ML IV SOLN
INTRAVENOUS | Status: AC
  Filled 2014-10-25: qty 5

## 2014-10-25 MED ORDER — FENTANYL CITRATE 0.05 MG/ML IJ SOLN
INTRAMUSCULAR | Status: AC
  Filled 2014-10-25: qty 5

## 2014-10-25 MED ORDER — HYDROMORPHONE HCL 1 MG/ML IJ SOLN: 0.2500 mg | INTRAMUSCULAR | Status: DC | PRN

## 2014-10-25 MED ORDER — PHENYLEPHRINE HCL 10 MG/ML IJ SOLN
INTRAMUSCULAR | Status: DC | PRN
  Administered 2014-10-25 (×2): 80 ug via INTRAVENOUS

## 2014-10-25 MED ORDER — EPHEDRINE SULFATE 50 MG/ML IJ SOLN
INTRAMUSCULAR | Status: AC
  Filled 2014-10-25: qty 1

## 2014-10-25 MED ORDER — PROPOFOL 10 MG/ML IV BOLUS
INTRAVENOUS | Status: AC
  Filled 2014-10-25: qty 20

## 2014-10-25 MED ORDER — ROCURONIUM BROMIDE 50 MG/5ML IV SOLN
INTRAVENOUS | Status: AC
  Filled 2014-10-25: qty 1

## 2014-10-25 MED ORDER — OXYCODONE-ACETAMINOPHEN 5-325 MG PO TABS: 1.0000 | ORAL_TABLET | Freq: Four times a day (QID) | ORAL | Status: DC | PRN

## 2014-10-25 MED ORDER — FENTANYL CITRATE 0.05 MG/ML IJ SOLN
INTRAMUSCULAR | Status: DC | PRN
  Administered 2014-10-25 (×2): 25 ug via INTRAVENOUS

## 2014-10-25 MED ORDER — LIDOCAINE-EPINEPHRINE (PF) 1 %-1:200000 IJ SOLN
INTRAMUSCULAR | Status: AC
  Filled 2014-10-25: qty 10

## 2014-10-25 MED ORDER — LIDOCAINE-EPINEPHRINE (PF) 1 %-1:200000 IJ SOLN
INTRAMUSCULAR | Status: DC | PRN
  Administered 2014-10-25: 19 mL

## 2014-10-25 MED ORDER — SUCCINYLCHOLINE CHLORIDE 20 MG/ML IJ SOLN
INTRAMUSCULAR | Status: AC
  Filled 2014-10-25: qty 1

## 2014-10-25 SURGICAL SUPPLY — 29 items
CANISTER SUCTION 2500CC (MISCELLANEOUS) ×3 IMPLANT
CLIP TI MEDIUM 6 (CLIP) ×3 IMPLANT
CLIP TI WIDE RED SMALL 6 (CLIP) ×3 IMPLANT
COVER PROBE W GEL 5X96 (DRAPES) IMPLANT
ELECT REM PT RETURN 9FT ADLT (ELECTROSURGICAL) ×3
ELECTRODE REM PT RTRN 9FT ADLT (ELECTROSURGICAL) ×1 IMPLANT
GAUZE SPONGE 4X4 12PLY STRL (GAUZE/BANDAGES/DRESSINGS) ×3 IMPLANT
GEL ULTRASOUND 20GR AQUASONIC (MISCELLANEOUS) IMPLANT
GLOVE BIOGEL M 6.5 STRL (GLOVE) ×6 IMPLANT
GLOVE BIOGEL PI IND STRL 6.5 (GLOVE) ×3 IMPLANT
GLOVE BIOGEL PI INDICATOR 6.5 (GLOVE) ×6
GLOVE SS BIOGEL STRL SZ 7 (GLOVE) ×1 IMPLANT
GLOVE SUPERSENSE BIOGEL SZ 7 (GLOVE) ×2
GLOVE SURG SS PI 7.0 STRL IVOR (GLOVE) ×3 IMPLANT
GOWN STRL REUS W/ TWL LRG LVL3 (GOWN DISPOSABLE) ×3 IMPLANT
GOWN STRL REUS W/TWL LRG LVL3 (GOWN DISPOSABLE) ×6
KIT BASIN OR (CUSTOM PROCEDURE TRAY) ×3 IMPLANT
KIT ROOM TURNOVER OR (KITS) ×3 IMPLANT
LIQUID BAND (GAUZE/BANDAGES/DRESSINGS) ×3 IMPLANT
LOOP VESSEL MAXI BLUE (MISCELLANEOUS) ×3 IMPLANT
NS IRRIG 1000ML POUR BTL (IV SOLUTION) ×3 IMPLANT
PACK CV ACCESS (CUSTOM PROCEDURE TRAY) ×3 IMPLANT
PAD ARMBOARD 7.5X6 YLW CONV (MISCELLANEOUS) ×6 IMPLANT
SUT PROLENE 6 0 BV (SUTURE) ×3 IMPLANT
SUT VIC AB 3-0 SH 27 (SUTURE) ×2
SUT VIC AB 3-0 SH 27X BRD (SUTURE) ×1 IMPLANT
SUT VIC AB 3-0 SH 8-18 (SUTURE) ×3 IMPLANT
UNDERPAD 30X30 INCONTINENT (UNDERPADS AND DIAPERS) ×3 IMPLANT
WATER STERILE IRR 1000ML POUR (IV SOLUTION) ×3 IMPLANT

## 2014-10-25 NOTE — Anesthesia Preprocedure Evaluation (Addendum)
Anesthesia Evaluation  Patient identified by MRN, date of birth, ID band Patient awake    Reviewed: Allergy & Precautions, NPO status , Patient's Chart, lab work & pertinent test results  Airway Mallampati: II  TM Distance: >3 FB Neck ROM: Full    Dental  (+) Teeth Intact, Dental Advisory Given   Pulmonary  breath sounds clear to auscultation        Cardiovascular hypertension, +CHF Rhythm:Regular Rate:Normal  ECHO [facility] 02/2014): EF <15%, multiple apical thrombi, RV mod enlarged, mod MR    Neuro/Psych    GI/Hepatic negative GI ROS, Neg liver ROS,   Endo/Other  diabetes, Type 2Glucose 165  Renal/GU ESRF and DialysisRenal diseaseK 4.3     Musculoskeletal  (+) Arthritis -,   Abdominal   Peds  Hematology   Anesthesia Other Findings   Reproductive/Obstetrics                          Anesthesia Physical  Anesthesia Plan  ASA: III  Anesthesia Plan: MAC   Post-op Pain Management:    Induction: Intravenous  Airway Management Planned: Simple Face Mask and Natural Airway  Additional Equipment:   Intra-op Plan:   Post-operative Plan:   Informed Consent: I have reviewed the patients History and Physical, chart, labs and discussed the procedure including the risks, benefits and alternatives for the proposed anesthesia with the patient or authorized representative who has indicated his/her understanding and acceptance.   Dental advisory given  Plan Discussed with: Anesthesiologist, Surgeon and CRNA  Anesthesia Plan Comments:        Anesthesia Quick Evaluation

## 2014-10-25 NOTE — Consult Note (Signed)
Vascular Surgery Consultation  Reason for Consult: Right arm fistula not maturing well  HPI: [name] is a 64 y.o. male who presents for evaluation of right arm fistula. This radial cephalic AV fistula in the right arm was created by me 06/27/2014. It is patent but slightly deep. He is scheduled for a revision by superficialization   Past Medical History  Diagnosis Date  . Diabetes mellitus without complication   . Chronic systolic heart failure     a) ECHO [facility] 02/2014): EF <15%, multiple apical thrombi, RV mod enlarged, mod MR b. RHC (02/21/14) at [facility]: RA 16, RV 64/9 (15), PA 64/32 (42), PCWP: 24, CI: 1.4  . HTN (hypertension)   . HLD (hyperlipidemia)   . CKD (chronic kidney disease), stage III   . Apical mural thrombus   . Mitral regurgitation   . Drug use     cocaine  . Atrial fibrillation, chronic   . Pneumonia   . Arthritis   . Anemia    Past Surgical History  Procedure Laterality Date  . Av fistula placement Right 06/27/2014    Procedure: ARTERIOVENOUS (AV) FISTULA CREATION VERSUS GRAFT INSERTION;  Surgeon: Pryor Ochoa, MD;  Location: [facility] OR;  Service: Vascular;  Laterality: Right;  . Colonoscopy    . Insertion of dialysis catheter  12/15   History   Social History  . Marital Status: Divorced    Spouse Name: N/A  . Number of Children: N/A  . Years of Education: N/A   Social History Main Topics  . Smoking status: Never Smoker   . Smokeless tobacco: Never Used  . Alcohol Use: No     Comment: last usuage 1 yr. ago  . Drug Use: Yes    Special: Cocaine     Comment: hx cocaine use  as of 1 yr. ago  . Sexual Activity: Not on file   Other Topics Concern  . None   Social History Narrative   Respiratory therapist   History reviewed. No pertinent family history. No Known Allergies Prior to Admission medications   Medication Sig Start Date End Date Taking? Authorizing Provider  acetaminophen (TYLENOL) 500 MG tablet Take 500 mg by mouth every 6 (six)  hours as needed for moderate pain.   Yes Historical Provider, MD  allopurinol (ZYLOPRIM) 100 MG tablet Take 100 mg by mouth daily.   Yes Historical Provider, MD  amiodarone (PACERONE) 200 MG tablet Take 1 tablet (200 mg total) by mouth daily. 08/23/14  Yes Amy D Clegg, NP  calcium acetate (PHOSLO) 667 MG capsule Take 667 mg by mouth daily.    Yes Historical Provider, MD  insulin NPH Human (HUMULIN N,NOVOLIN N) 100 UNIT/ML injection Inject 0.12 mLs (12 Units total) into the skin at bedtime. 09/13/14  Yes Margarita Grizzle, MD  insulin regular (NOVOLIN R,HUMULIN R) 100 units/mL injection Inject 0.06 mLs (6 Units total) into the skin 3 (three) times daily before meals. 09/13/14  Yes Margarita Grizzle, MD  warfarin (COUMADIN) 4 MG tablet Take 0.5 tablets (2 mg total) by mouth daily. 06/28/14  Yes Amy D Clegg, NP     Positive ROS:   All other systems have been reviewed and were otherwise negative with the exception of those mentioned in the HPI and as above.  Physical Exam: Filed Vitals:   10/25/14 0727  BP: 95/69  Pulse: 97  Temp: 97.4 F (36.3 C)  Resp: 18    General: Alert, no acute distress HEENT: Normal for age Cardiovascular: Regular rate  and rhythm. Carotid pulses 2+, no bruits audible Respiratory: Clear to auscultation. No cyanosis, no use of accessory musculature GI: No organomegaly, abdomen is soft and non-tender Skin: No lesions in the area of chief complaint Neurologic: Sensation intact distally Psychiatric: Patient is competent for consent with normal mood and affect Musculoskeletal: No obvious deformities Extremities: Right arm with pulse and palpable thrill and radial-cephalic AV fistul    Assessment/Plan:  Plan revision right radial-cephalic AV fistula with ligation of any competing branches and bring fistula more to surface   Josephina Gip, MD 10/25/2014 7:57 AM

## 2014-10-25 NOTE — Anesthesia Procedure Notes (Signed)
Procedure Name: MAC Date/Time: 10/25/2014 9:54 AM Performed by: Leonel Ramsay Pre-anesthesia Checklist: Patient identified, Timeout performed, Emergency Drugs available, Suction available and Patient being monitored Patient Re-evaluated:Patient Re-evaluated prior to inductionOxygen Delivery Method: Simple face mask Dental Injury: Teeth and Oropharynx as per pre-operative assessment

## 2014-10-25 NOTE — Discharge Instructions (Signed)
°What to eat: ° °For your first meals, you should eat lightly; only small meals initially.  If you do not have nausea, you may eat larger meals.  Avoid spicy, greasy and heavy food.   ° °General Anesthesia, Adult, Care After  °Refer to this sheet in the next few weeks. These instructions provide you with information on caring for yourself after your procedure. Your health care provider may also give you more specific instructions. Your treatment has been planned according to current medical practices, but problems sometimes occur. Call your health care provider if you have any problems or questions after your procedure.  °WHAT TO EXPECT AFTER THE PROCEDURE  °After the procedure, it is typical to experience:  °Sleepiness.  °Nausea and vomiting. °HOME CARE INSTRUCTIONS  °For the first 24 hours after general anesthesia:  °Have a responsible person with you.  °Do not drive a car. If you are alone, do not take public transportation.  °Do not drink alcohol.  °Do not take medicine that has not been prescribed by your health care provider.  °Do not sign important papers or make important decisions.  °You may resume a normal diet and activities as directed by your health care provider.  °Change bandages (dressings) as directed.  °If you have questions or problems that seem related to general anesthesia, call the hospital and ask for the anesthetist or anesthesiologist on call. °SEEK MEDICAL CARE IF:  °You have nausea and vomiting that continue the day after anesthesia.  °You develop a rash. °SEEK IMMEDIATE MEDICAL CARE IF:  °You have difficulty breathing.  °You have chest pain.  °You have any allergic problems. °Document Released: 11/02/2000 Document Revised: 03/29/2013 Document Reviewed: 02/09/2013  °ExitCare® Patient Information ©2014 [employer].  ° °Sore Throat  ° ° °A sore throat is a painful, burning, sore, or scratchy feeling of the throat. There may be pain or tenderness when swallowing or talking. You may have  other symptoms with a sore throat. These include coughing, sneezing, fever, or a swollen neck. A sore throat is often the first sign of another sickness. These sicknesses may include a cold, flu, strep throat, or an infection called mono. Most sore throats go away without medical treatment.  °HOME CARE  °Only take medicine as told by your doctor.  °Drink enough fluids to keep your pee (urine) clear or pale yellow.  °Rest as needed.  °Try using throat sprays, lozenges, or suck on hard candy (if older than 4 years or as told).  °Sip warm liquids, such as broth, herbal tea, or warm water with honey. Try sucking on frozen ice pops or drinking cold liquids.  °Rinse the mouth (gargle) with salt water. Mix 1 teaspoon salt with 8 ounces of water.  °Do not smoke. Avoid being around others when they are smoking.  °Put a humidifier in your bedroom at night to moisten the air. You can also turn on a hot shower and sit in the bathroom for 5-10 minutes. Be sure the bathroom door is closed. °GET HELP RIGHT AWAY IF:  °You have trouble breathing.  °You cannot swallow fluids, soft foods, or your spit (saliva).  °You have more puffiness (swelling) in the throat.  °Your sore throat does not get better in 7 days.  °You feel sick to your stomach (nauseous) and throw up (vomit).  °You have a fever or lasting symptoms for more than 2-3 days.  °You have a fever and your symptoms suddenly get worse. °MAKE SURE YOU:  °Understand these   instructions.  °Will watch your condition.  °Will get help right away if you are not doing well or get worse. °Document Released: 05/05/2008 Document Revised: 04/20/2012 Document Reviewed: 04/03/2012  °ExitCare® Patient Information ©2015 [employer]. This information is not intended to replace advice given to you by your health care provider. Make sure you discuss any questions you have with your health care provider.  ° ° ° °

## 2014-10-25 NOTE — Op Note (Signed)
OPERATIVE REPORT  Date of Surgery: 10/25/2014  Surgeon: Josephina Gip, MD  Assistant: Nurse  Pre-op Diagnosis: End Stage Renal Disease with poor maturation radial cephalic AV fistula  Post-op Diagnosis: Same  Procedure: Procedure(s): RIGHT ARM RADIOCEPHALIC FISTULA SUPERFICIALIZATION plus ligation of 3 competing branches  Anesthesia: Mac  EBL: Minimal  Complications: None  Procedure Details: The patient was taken the operating room placed in supine position at which time some intravenous sedation was performed. The right radial-cephalic AV fistula had a palpable pulse and thrill but was slightly deep. Its course was marked on the forearm. After prepping and draping in routine sterile manner and infiltration with forms and Xylocaine with epinephrine 2 incisions were made in the cephalic vein was easily identified and the distal incision dissected free circumflex Lee back to near the arterial anastomosis and working under the skin bridge was circumflex Lee mobilized up to the second incision where this continued up to the antecubital space. There were 2 moderate-sized branches were ligated with 3-0 silk ties and divided and one smaller branch ligated with 3-0 silk ties and divided. After circumflex Shea Evans mobilizing the vein the space deep to the vein was closed with some interrupted 3-0 Vicryl sutures bringing the vein more to the surface. This was also accomplished beneath the skin bridge. Following this an adequate hemostasis the skin incisions were closed with 1 layer of 3-0 Vicryl in a subcuticular fashion with Dermabond patient taken to recovery room in stable condition there was a good Doppler flow in the fistula then the case   Josephina Gip, MD 10/25/2014 10:54 AM

## 2014-10-25 NOTE — Transfer of Care (Signed)
Immediate Anesthesia Transfer of Care Note  Patient: [name]  Procedure(s) Performed: Procedure(s): RIGHT ARM RADIOCEPHALIC FISTULA SUPERFICIALIZATION (Right)  Patient Location: PACU  Anesthesia Type:MAC  Level of Consciousness: awake, alert  and oriented  Airway & Oxygen Therapy: Patient Spontanous Breathing and Patient connected to nasal cannula oxygen  Post-op Assessment: Report given to RN and Post -op Vital signs reviewed and stable  Post vital signs: Reviewed and stable  Last Vitals:  Filed Vitals:   10/25/14 0727  BP: 95/69  Pulse: 97  Temp: 36.3 C  Resp: 18    Complications: No apparent anesthesia complications

## 2014-10-25 NOTE — Anesthesia Postprocedure Evaluation (Signed)
  Anesthesia Post-op Note  Patient: [name]  Procedure(s) Performed: Procedure(s): RIGHT ARM RADIOCEPHALIC FISTULA SUPERFICIALIZATION (Right)  Patient Location: PACU  Anesthesia Type:MAC  Level of Consciousness: awake  Airway and Oxygen Therapy: Patient Spontanous Breathing  Post-op Pain: mild  Post-op Assessment: Post-op Vital signs reviewed  Post-op Vital Signs: Reviewed  Last Vitals:  Filed Vitals:   10/25/14 1230  BP: 101/74  Pulse: 85  Temp:   Resp: 15    Complications: No apparent anesthesia complications

## 2014-10-26 ENCOUNTER — Encounter (HOSPITAL_COMMUNITY): Payer: Self-pay | Admitting: Vascular Surgery

## 2014-10-26 ENCOUNTER — Telehealth: Payer: Self-pay | Admitting: Vascular Surgery

## 2014-10-26 NOTE — Telephone Encounter (Signed)
-----   Message from Sharee Pimple, RN sent at 10/25/2014 12:16 PM EDT ----- Regarding: Schedule   ----- Message -----    From: Raymond Gurney, PA-C    Sent: 10/25/2014  10:17 AM      To: [facility] Charge Pool  S/p superficialization of right RC-AVF 10/25/14  F/u with Dr. Hart Rochester in 4 weeks with duplex  Thanks Selena Batten

## 2014-11-15 ENCOUNTER — Ambulatory Visit: Payer: Medicare HMO | Admitting: Endocrinology

## 2014-11-15 ENCOUNTER — Telehealth: Payer: Self-pay | Admitting: Endocrinology

## 2014-11-15 DIAGNOSIS — Z0289 Encounter for other administrative examinations: Secondary | ICD-10-CM

## 2014-11-15 NOTE — Telephone Encounter (Signed)
Patient no showed today's appt. Please advise on how to follow up. °A. No follow up necessary. °B. Follow up urgent. Contact patient immediately. °C. Follow up necessary. Contact patient and schedule visit in ___ days. °D. Follow up advised. Contact patient and schedule visit in ____weeks. ° °

## 2014-11-15 NOTE — Telephone Encounter (Signed)
Please notify referring physician, this is a new patient

## 2014-12-10 ENCOUNTER — Encounter: Payer: Self-pay | Admitting: Vascular Surgery

## 2014-12-11 ENCOUNTER — Encounter: Payer: Medicare HMO | Admitting: Vascular Surgery

## 2014-12-11 ENCOUNTER — Encounter (HOSPITAL_COMMUNITY): Payer: Medicare HMO

## 2014-12-14 ENCOUNTER — Encounter: Payer: Self-pay | Admitting: Vascular Surgery

## 2014-12-18 ENCOUNTER — Encounter: Payer: Medicare HMO | Admitting: Vascular Surgery

## 2014-12-18 ENCOUNTER — Inpatient Hospital Stay (HOSPITAL_COMMUNITY): Admission: RE | Admit: 2014-12-18 | Payer: Medicare HMO | Source: Ambulatory Visit

## 2014-12-20 ENCOUNTER — Ambulatory Visit (HOSPITAL_COMMUNITY)
Admission: RE | Admit: 2014-12-20 | Discharge: 2014-12-20 | Disposition: A | Discharge status: Home / Self Care | Payer: Medicare HMO | Source: Ambulatory Visit | Attending: Vascular Surgery | Admitting: Vascular Surgery

## 2014-12-20 DIAGNOSIS — Z4931 Encounter for adequacy testing for hemodialysis: Secondary | ICD-10-CM | POA: Insufficient documentation

## 2014-12-20 DIAGNOSIS — N186 End stage renal disease: Secondary | ICD-10-CM | POA: Diagnosis not present

## 2014-12-20 LAB — VAS US DUPLEX DIALYSIS ACCESS (AVF, AVG)
ART-VENOUS ANASTAMOSIS PEAK SYS VEL: 453 cm/s
ART-VENOUS ANASTAMOSIS RATIO: 4 cm/s
DIALYSIS FIST OUTFLOW VEIN SEG 1 PEAK SYS VEL: 654 cm/s
DIALYSIS FIST OUTFLOW VEIN SEG 2 PEAK SYS VEL: 64 cm/s
DIALYSIS FIST OUTFLOW VEIN SEG 3 PEAK SYS VEL: 254 cm/s
DIALYSIS GRAFT OUTFLOW SEG4 PEAK SYS VEL: 83 cm/s
DIALYSIS INFLOW ART PEAK SYS VEL: 113 cm/s
DIALYSIS OUTFLOW SEG1 DEPTH: 0.29 cm/s
DIALYSIS OUTFLOW SEG1 DIAM: 0.83 cm/s
DIALYSIS OUTFLOW SEG2 DEPTH: 0.44 cm/s
DIALYSIS OUTFLOW SEG2 DIAM: 0.71 cm/s
DIALYSIS OUTFLOW SEG3 DEPTH: 0.23 cm/s
DIALYSIS OUTFLOW SEG3 DIAM: 0.68 cm/s
DIALYSIS OUTFLOW SEG4 DEPTH: 0.54 cm/s
DIALYSIS OUTFLOW SEG4 DIAM: 0.71 cm/s

## 2014-12-21 ENCOUNTER — Encounter: Payer: Self-pay | Admitting: Vascular Surgery

## 2014-12-24 ENCOUNTER — Telehealth: Payer: Self-pay

## 2014-12-24 NOTE — Telephone Encounter (Signed)
Spoke with Bard Herbert, PA with nephrology she shares our concern about pt being on Coumadin.  States she checked an INR on 12/13/14 and it was 1.5, rechecked an INR at the end of last week and it is greater than 4.  When she spoke with pt face to face he could not tell her what dosage of Coumadin he had been taking.  She expresses concern in cognitive ability to take Coumadin and dose adjust as instructed.  When pt questioned further about appts he could not remember going to an appt last week which she had documentation from, nor could he remember he had a follow-up appt scheduled for tomorrow as a result of that previous appt.  Coumadin clinic shares her concern as last INR we checked was by Dr Gala Romney at [facility] 08/23/14 and it took over 10 phone calls and until 09/14/14 to be able to reach pt by phone for an INR of 1.35.  He has not had f/u INR in Coumadin clinic since then, he had appt scheduled for 09/18/14 which he no showed for.  Patient has documented noncompliance.  Sharl Ma is questioning whether pt should be taking Coumadin, and whether we can or should discontinue.  Please advise.  Thanks

## 2014-12-25 ENCOUNTER — Encounter: Payer: Self-pay | Admitting: Vascular Surgery

## 2014-12-25 ENCOUNTER — Ambulatory Visit (INDEPENDENT_AMBULATORY_CARE_PROVIDER_SITE_OTHER): Payer: Self-pay | Admitting: Vascular Surgery

## 2014-12-25 ENCOUNTER — Encounter (HOSPITAL_COMMUNITY): Payer: Medicare HMO

## 2014-12-25 VITALS — BP 90/60 | HR 87 | Ht 72.0 in | Wt 231.3 lb

## 2014-12-25 DIAGNOSIS — N186 End stage renal disease: Secondary | ICD-10-CM | POA: Insufficient documentation

## 2014-12-25 NOTE — Progress Notes (Signed)
Subjective:     Patient ID: [name], male   DOB: [birth year], 64 y.o.   MRN: [id number]  HPI this 64 year old male with end-stage renal disease has dialysis Monday Wednesday and Friday at [facility]. Patient had right radial cephalic fistula created by me November 2015. He had superficialization plus ligation of 3 competing branches on October 25 2014. He has had no pain or numbness in the right hand. He returns today for examination.   Review of Systems     Objective:   Physical Exam BP 90/60 mmHg  Pulse 87  Ht 6' (1.829 m)  Wt 231 lb 4.8 oz (104.917 kg)  BMI 31.36 kg/m2  SpO2 96%  Gen. well-developed well-nourished male in no apparent distress alert and oriented 3 Right forearm incisions nicely healed. Excellent pulse and palpable thrill in radial cephalic fistula which is easy to palpate. No ischemia right hand.     Assessment:     End-stage renal disease with right radial cephalic AV fistula-ready to utilize for hemodialysis    Plan:     Would plan to proceed with attempting to access right radial cephalic AV fistula at any time and after this is used successfully for a few weeks then tunneled catheter can be removed Patient will return to see [place] on when necessary basis

## 2015-01-01 NOTE — Telephone Encounter (Signed)
If he is not coming in for checks we should stop. I have not seen him in HF Clinic in a while either.

## 2015-01-10 ENCOUNTER — Encounter (HOSPITAL_COMMUNITY): Payer: Medicare HMO

## 2015-01-16 NOTE — Telephone Encounter (Signed)
Called spoke with pt, advised pt of Dr Bensimhon's recommendation to discontinue Warfarin if not being monitored regularly in our clinic.  Pt states he has just moved to [place], [place] and he is establishing care with nephrologist and HD tomorrow, and then will be establishing with a cardiologist there.  Advised pt we will no longer be rx Warfarin since we are not monitoring and if pt wishes to continue taking he needs to notify his MD's in [place] and have them rx and monitor it.  Pt verbalized understanding.

## 2016-02-17 ENCOUNTER — Inpatient Hospital Stay (HOSPITAL_COMMUNITY)
Admission: EM | Admit: 2016-02-17 | Discharge: 2016-02-22 | DRG: 291 | Disposition: A | Discharge status: Home / Self Care | Payer: Medicare HMO | Attending: Family Medicine | Admitting: Family Medicine

## 2016-02-17 ENCOUNTER — Encounter (HOSPITAL_COMMUNITY): Payer: Self-pay

## 2016-02-17 ENCOUNTER — Emergency Department (HOSPITAL_COMMUNITY): Payer: Medicare HMO

## 2016-02-17 DIAGNOSIS — I132 Hypertensive heart and chronic kidney disease with heart failure and with stage 5 chronic kidney disease, or end stage renal disease: Secondary | ICD-10-CM | POA: Diagnosis present

## 2016-02-17 DIAGNOSIS — I340 Nonrheumatic mitral (valve) insufficiency: Secondary | ICD-10-CM | POA: Diagnosis present

## 2016-02-17 DIAGNOSIS — N186 End stage renal disease: Secondary | ICD-10-CM

## 2016-02-17 DIAGNOSIS — E1121 Type 2 diabetes mellitus with diabetic nephropathy: Secondary | ICD-10-CM | POA: Insufficient documentation

## 2016-02-17 DIAGNOSIS — I480 Paroxysmal atrial fibrillation: Secondary | ICD-10-CM | POA: Diagnosis present

## 2016-02-17 DIAGNOSIS — Z66 Do not resuscitate: Secondary | ICD-10-CM | POA: Diagnosis present

## 2016-02-17 DIAGNOSIS — D649 Anemia, unspecified: Secondary | ICD-10-CM | POA: Diagnosis present

## 2016-02-17 DIAGNOSIS — E213 Hyperparathyroidism, unspecified: Secondary | ICD-10-CM | POA: Diagnosis present

## 2016-02-17 DIAGNOSIS — K831 Obstruction of bile duct: Secondary | ICD-10-CM | POA: Diagnosis present

## 2016-02-17 DIAGNOSIS — Z992 Dependence on renal dialysis: Secondary | ICD-10-CM | POA: Diagnosis not present

## 2016-02-17 DIAGNOSIS — I482 Chronic atrial fibrillation: Secondary | ICD-10-CM | POA: Diagnosis present

## 2016-02-17 DIAGNOSIS — I5022 Chronic systolic (congestive) heart failure: Secondary | ICD-10-CM | POA: Diagnosis present

## 2016-02-17 DIAGNOSIS — I429 Cardiomyopathy, unspecified: Secondary | ICD-10-CM | POA: Diagnosis present

## 2016-02-17 DIAGNOSIS — Z7982 Long term (current) use of aspirin: Secondary | ICD-10-CM | POA: Diagnosis not present

## 2016-02-17 DIAGNOSIS — Z9115 Patient's noncompliance with renal dialysis: Secondary | ICD-10-CM | POA: Diagnosis not present

## 2016-02-17 DIAGNOSIS — R0602 Shortness of breath: Secondary | ICD-10-CM

## 2016-02-17 DIAGNOSIS — E875 Hyperkalemia: Secondary | ICD-10-CM | POA: Diagnosis present

## 2016-02-17 DIAGNOSIS — E785 Hyperlipidemia, unspecified: Secondary | ICD-10-CM | POA: Diagnosis present

## 2016-02-17 DIAGNOSIS — R531 Weakness: Secondary | ICD-10-CM | POA: Diagnosis present

## 2016-02-17 DIAGNOSIS — E1122 Type 2 diabetes mellitus with diabetic chronic kidney disease: Secondary | ICD-10-CM | POA: Diagnosis present

## 2016-02-17 DIAGNOSIS — Z7901 Long term (current) use of anticoagulants: Secondary | ICD-10-CM | POA: Diagnosis not present

## 2016-02-17 DIAGNOSIS — D631 Anemia in chronic kidney disease: Secondary | ICD-10-CM | POA: Diagnosis present

## 2016-02-17 DIAGNOSIS — R799 Abnormal finding of blood chemistry, unspecified: Secondary | ICD-10-CM

## 2016-02-17 DIAGNOSIS — K7689 Other specified diseases of liver: Secondary | ICD-10-CM

## 2016-02-17 HISTORY — DX: Other specified diseases of liver: K76.89

## 2016-02-17 LAB — COMPREHENSIVE METABOLIC PANEL
ALT: 16 U/L — AB (ref 17–63)
ANION GAP: 19 — AB (ref 5–15)
AST: 34 U/L (ref 15–41)
Albumin: 2.8 g/dL — ABNORMAL LOW (ref 3.5–5.0)
Alkaline Phosphatase: 110 U/L (ref 38–126)
BUN: 102 mg/dL — ABNORMAL HIGH (ref 6–20)
CHLORIDE: 94 mmol/L — AB (ref 101–111)
CO2: 24 mmol/L (ref 22–32)
Calcium: 9.4 mg/dL (ref 8.9–10.3)
Creatinine, Ser: 12.44 mg/dL — ABNORMAL HIGH (ref 0.61–1.24)
GFR calc non Af Amer: 4 mL/min — ABNORMAL LOW (ref >60)
GFR, EST AFRICAN AMERICAN: 4 mL/min — AB (ref >60)
Glucose, Bld: 105 mg/dL — ABNORMAL HIGH (ref 65–99)
Potassium: 5.7 mmol/L — ABNORMAL HIGH (ref 3.5–5.1)
Sodium: 137 mmol/L (ref 135–145)
Total Bilirubin: 7.7 mg/dL — ABNORMAL HIGH (ref 0.3–1.2)
Total Protein: 8.4 g/dL — ABNORMAL HIGH (ref 6.5–8.1)

## 2016-02-17 LAB — MRSA PCR SCREENING: MRSA BY PCR: NEGATIVE

## 2016-02-17 LAB — CBC
HEMATOCRIT: 34.4 % — AB (ref 39.0–52.0)
Hemoglobin: 11.7 g/dL — ABNORMAL LOW (ref 13.0–17.0)
MCH: 30.4 pg (ref 26.0–34.0)
MCHC: 34 g/dL (ref 30.0–36.0)
MCV: 89.4 fL (ref 78.0–100.0)
Platelets: 156 10*3/uL (ref 150–400)
RBC: 3.85 MIL/uL — ABNORMAL LOW (ref 4.22–5.81)
RDW: 16.7 % — AB (ref 11.5–15.5)
WBC: 6.6 10*3/uL (ref 4.0–10.5)

## 2016-02-17 LAB — I-STAT TROPONIN, ED: Troponin i, poc: 0.02 ng/mL (ref 0.00–0.08)

## 2016-02-17 LAB — GLUCOSE, CAPILLARY: GLUCOSE-CAPILLARY: 81 mg/dL (ref 65–99)

## 2016-02-17 LAB — PHOSPHORUS: Phosphorus: 9.5 mg/dL — ABNORMAL HIGH (ref 2.5–4.6)

## 2016-02-17 MED ORDER — SODIUM CHLORIDE 0.9 % IV SOLN
100.0000 mL | INTRAVENOUS | Status: DC | PRN
Start: 2016-02-17 — End: 2016-02-18

## 2016-02-17 MED ORDER — LIDOCAINE HCL (PF) 1 % IJ SOLN: 5.0000 mL | INTRAMUSCULAR | Status: DC | PRN

## 2016-02-17 MED ORDER — INSULIN ASPART 100 UNIT/ML ~~LOC~~ SOLN
0.0000 [IU] | Freq: Three times a day (TID) | SUBCUTANEOUS | Status: DC
  Administered 2016-02-20 (×2): 1 [IU] via SUBCUTANEOUS
  Administered 2016-02-22: 2 [IU] via SUBCUTANEOUS
  Administered 2016-02-22: 1 [IU] via SUBCUTANEOUS

## 2016-02-17 MED ORDER — LIDOCAINE-PRILOCAINE 2.5-2.5 % EX CREA: 1.0000 "application " | TOPICAL_CREAM | CUTANEOUS | Status: DC | PRN

## 2016-02-17 MED ORDER — ALTEPLASE 2 MG IJ SOLR: 2.0000 mg | Freq: Once | INTRAMUSCULAR | Status: DC | PRN

## 2016-02-17 MED ORDER — HEPARIN SODIUM (PORCINE) 1000 UNIT/ML DIALYSIS: 1000.0000 [IU] | INTRAMUSCULAR | Status: DC | PRN

## 2016-02-17 MED ORDER — HEPARIN SODIUM (PORCINE) 1000 UNIT/ML DIALYSIS: 20.0000 [IU]/kg | INTRAMUSCULAR | Status: DC | PRN

## 2016-02-17 MED ORDER — INSULIN ASPART 100 UNIT/ML ~~LOC~~ SOLN: 0.0000 [IU] | Freq: Every day | SUBCUTANEOUS | Status: DC

## 2016-02-17 MED ORDER — HYDROXYZINE HCL 25 MG PO TABS
25.0000 mg | ORAL_TABLET | Freq: Once | ORAL | Status: AC
  Administered 2016-02-17: 25 mg via ORAL
  Filled 2016-02-17: qty 1

## 2016-02-17 MED ORDER — SODIUM CHLORIDE 0.9% FLUSH
3.0000 mL | Freq: Two times a day (BID) | INTRAVENOUS | Status: DC
  Administered 2016-02-18 – 2016-02-22 (×7): 3 mL via INTRAVENOUS

## 2016-02-17 MED ORDER — HEPARIN SODIUM (PORCINE) 5000 UNIT/ML IJ SOLN
5000.0000 [IU] | Freq: Three times a day (TID) | INTRAMUSCULAR | Status: DC
  Administered 2016-02-19 – 2016-02-22 (×7): 5000 [IU] via SUBCUTANEOUS
  Filled 2016-02-17 (×9): qty 1

## 2016-02-17 MED ORDER — CALCIUM ACETATE (PHOS BINDER) 667 MG PO CAPS
667.0000 mg | ORAL_CAPSULE | Freq: Every day | ORAL | Status: DC
  Administered 2016-02-17 – 2016-02-22 (×5): 667 mg via ORAL
  Filled 2016-02-17 (×5): qty 1

## 2016-02-17 MED ORDER — ASPIRIN EC 81 MG PO TBEC
81.0000 mg | DELAYED_RELEASE_TABLET | Freq: Every day | ORAL | Status: DC
  Administered 2016-02-17 – 2016-02-22 (×5): 81 mg via ORAL
  Filled 2016-02-17 (×7): qty 1

## 2016-02-17 MED ORDER — PENTAFLUOROPROP-TETRAFLUOROETH EX AERO: 1.0000 "application " | INHALATION_SPRAY | CUTANEOUS | Status: DC | PRN

## 2016-02-17 MED ORDER — AMIODARONE HCL 200 MG PO TABS
200.0000 mg | ORAL_TABLET | Freq: Every day | ORAL | Status: DC
  Administered 2016-02-17 – 2016-02-22 (×5): 200 mg via ORAL
  Filled 2016-02-17 (×6): qty 1
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
## 2016-02-17 NOTE — ED Provider Notes (Signed)
CSN: [id number]     Arrival date & time 02/17/16  1029 History   First MD Initiated Contact with Patient 02/17/16 1132     Chief Complaint  Patient presents with  . Weakness  . no dialysis x 2 weeks      (Consider location/radiation/quality/duration/timing/severity/associated sxs/prior Treatment) HPI   Gen weakness, shortness of breath, last dialysis 1.5 wks ago, was in [place] and moved back here. Used to go to [facility], now no dialysis center here, hoping to get reestablished there.  Was going to try to call them today and start tomorrow but feeling too sick.    Short of breath since this AM.  Able to get around but not great. No appetite.  Started 2-3 days ago but worse today.  No nausea, no vomiting.  Feel full.    Was on coumadin but stopped because of levels.  Have not taken medicines in 2-3 days since move.    Nephew reports pt has short term memory loss and spoke with pt ex-wife (who he had break up with in [place]) who reports he had dialysis on Wednesday.    Past Medical History  Diagnosis Date  . Diabetes mellitus without complication (HCC)   . Chronic systolic heart failure (HCC)     a) ECHO [facility] 02/2014): EF <15%, multiple apical thrombi, RV mod enlarged, mod MR b. RHC (02/21/14) at [facility]: RA 16, RV 64/9 (15), PA 64/32 (42), PCWP: 24, CI: 1.4  . HTN (hypertension)   . HLD (hyperlipidemia)   . CKD (chronic kidney disease), stage III   . Apical mural thrombus (HCC)   . Mitral regurgitation   . Drug use     cocaine  . Atrial fibrillation, chronic (HCC)   . Pneumonia   . Arthritis   . Anemia    Past Surgical History  Procedure Laterality Date  . Av fistula placement Right 06/27/2014    Procedure: ARTERIOVENOUS (AV) FISTULA CREATION VERSUS GRAFT INSERTION;  Surgeon: Pryor Ochoa, MD;  Location: [facility] OR;  Service: Vascular;  Laterality: Right;  . Colonoscopy    . Insertion of dialysis catheter  12/15  . Fistula superficialization Right 10/25/2014    Procedure: RIGHT ARM  RADIOCEPHALIC FISTULA SUPERFICIALIZATION;  Surgeon: Pryor Ochoa, MD;  Location: [facility] OR;  Service: Vascular;  Laterality: Right;   History reviewed. No pertinent family history. Social History  Substance Use Topics  . Smoking status: Never Smoker   . Smokeless tobacco: Never Used  . Alcohol Use: No     Comment: last usuage 1 yr. ago    Review of Systems  Constitutional: Positive for appetite change and fatigue. Negative for fever.  HENT: Negative for sore throat.   Eyes: Negative for visual disturbance.  Respiratory: Positive for cough (slight) and shortness of breath.   Cardiovascular: Negative for chest pain.  Gastrointestinal: Negative for nausea, vomiting, abdominal pain and constipation. Abdominal distention: full feeling.  Genitourinary: Negative for difficulty urinating (makes urine just with defecation).  Musculoskeletal: Negative for back pain and neck stiffness.  Skin: Negative for rash.  Neurological: Negative for syncope, facial asymmetry, speech difficulty, weakness, numbness and headaches.      Allergies  Review of patient's allergies indicates no known allergies.  Home Medications   Prior to Admission medications   Medication Sig Start Date End Date Taking? Authorizing Provider  acetaminophen (TYLENOL) 500 MG tablet Take 500 mg by mouth every 6 (six) hours as needed for moderate pain.   Yes Historical Provider, MD  amiodarone (PACERONE) 200 MG tablet Take 1 tablet (200 mg total) by mouth daily. 08/23/14  Yes Amy D Clegg, NP  aspirin 81 MG tablet Take 81 mg by mouth daily.   Yes Historical Provider, MD  calcium acetate (PHOSLO) 667 MG capsule Take 667 mg by mouth daily.    Yes Historical Provider, MD  insulin NPH Human (HUMULIN N,NOVOLIN N) 100 UNIT/ML injection Inject 0.12 mLs (12 Units total) into the skin at bedtime. 09/13/14  Yes Margarita Grizzle, MD  insulin regular (NOVOLIN R,HUMULIN R) 100 units/mL injection Inject 0.06 mLs (6 Units total) into the skin 3 (three)  times daily before meals. 09/13/14  Yes Margarita Grizzle, MD  warfarin (COUMADIN) 4 MG tablet Take 0.5 tablets (2 mg total) by mouth daily. 06/28/14  Yes Amy D Clegg, NP  oxyCODONE-acetaminophen (ROXICET) 5-325 MG per tablet Take 1 tablet by mouth every 6 (six) hours as needed for severe pain. Patient not taking: Reported on 12/25/2014 10/25/14   Cala Bradford A Trinh, PA-C   BP 103/65 mmHg  Pulse 83  Temp(Src) 97.7 F (36.5 C) (Oral)  Resp 18  Ht 6' (1.829 m)  Wt 242 lb 1 oz (109.8 kg)  BMI 32.82 kg/m2  SpO2 95% Physical Exam  Constitutional: He is oriented to person, place, and time. He appears well-developed and well-nourished. No distress.  HENT:  Head: Normocephalic and atraumatic.  Eyes: Conjunctivae and EOM are normal.  Neck: Normal range of motion.  Cardiovascular: Normal rate, normal heart sounds and intact distal pulses.  An irregularly irregular rhythm present. Exam reveals no gallop and no friction rub.   No murmur heard. Pulmonary/Chest: Effort normal and breath sounds normal. No respiratory distress. He has no wheezes. He has no rales.  Abdominal: Soft. He exhibits no distension. There is no tenderness. There is no guarding.  Musculoskeletal: He exhibits no edema.  Neurological: He is alert and oriented to person, place, and time.  Skin: Skin is warm and dry. He is not diaphoretic.  Nursing note and vitals reviewed.   ED Course  Procedures (including critical care time) Labs Review Labs Reviewed  CBC - Abnormal; Notable for the following:    RBC 3.85 (*)    Hemoglobin 11.7 (*)    HCT 34.4 (*)    RDW 16.7 (*)    All other components within normal limits  COMPREHENSIVE METABOLIC PANEL - Abnormal; Notable for the following:    Potassium 5.7 (*)    Chloride 94 (*)    Glucose, Bld 105 (*)    BUN 102 (*)    Creatinine, Ser 12.44 (*)    Total Protein 8.4 (*)    Albumin 2.8 (*)    ALT 16 (*)    Total Bilirubin 7.7 (*)    GFR calc non Af Amer 4 (*)    GFR calc Af Amer 4 (*)     Anion gap 19 (*)    All other components within normal limits  PHOSPHORUS - Abnormal; Notable for the following:    Phosphorus 9.5 (*)    All other components within normal limits  PARATHYROID HORMONE, INTACT (NO CA)  HEPATITIS B SURFACE ANTIGEN  HEMOGLOBIN A1C  BASIC METABOLIC PANEL  I-STAT TROPOININ, ED    Imaging Review Dg Chest 2 View  02/17/2016  CLINICAL DATA:  Shortness of breath for 2 days. EXAM: CHEST  2 VIEW COMPARISON:  Chest x-ray dated 06/10/2014. FINDINGS: Cardiomegaly is stable. Overall cardiomediastinal silhouette is stable. Lungs are clear. Slight elevation of the right  hemidiaphragm is stable. Osseous structures about the chest are unremarkable. Presumed stimulator apparatus is seen along the left lateral chest wall, with catheter/wire projected anteriorly and terminating within the subcutaneous soft tissues overlying the sternum. IMPRESSION: 1. Cardiomegaly, stable. 2. Lungs are clear and there is no evidence of acute cardiopulmonary abnormality. No evidence of pneumonia. No pulmonary edema. Electronically Signed   By: Bary Richard M.D.   On: 02/17/2016 12:18   I have personally reviewed and evaluated these images and lab results as part of my medical decision-making.   EKG Interpretation   Date/Time:  Monday February 17 2016 11:02:50 EDT Ventricular Rate:  98 PR Interval:    QRS Duration: 126 QT Interval:  436 QTC Calculation: 556 R Axis:   -34 Text Interpretation:  Atrial fibrillation Left axis deviation Non-specific  intra-ventricular conduction block Cannot rule out Anterior infarct , age  undetermined Abnormal ECG Since prior ECG, atrial fibrillation has  replaced normal sinus rhythm Confirmed by [facility] MD, Shanedra Lave ([id number]) on  02/17/2016 2:00:39 PM      MDM   Final diagnoses:  Shortness of breath  End stage renal disease (HCC)  Paroxysmal atrial fibrillation (HCC)  Elevated BUN   65 year old male with history of ESRD on dialysis Monday Wednesday  Friday, atrial fibrillation off of Coumadin given difficulty monitoring INR is outpatient, diabetes, hypertension, hyperlipidemia, chronic systolic heart failure presents with concern of fatigue, decreased appetite, shortness of breath in setting of not receiving his dialysis. She reports is been 1-1/2-2 weeks since he last had dialysis, however his nephew reports that he has short-term memory loss and believes his last dialysis was last Wednesday. Patient's chest x-ray does not show significant signs of volume overload. He is not tachycardic or hypoxic. He is in atrial fibrillation however is rate controlled.  Anticoagulation discussion may be reinitated as pt is inpt given this was discontinued as outpatient.  Patient's BUN is 100, and feel symptoms of decreased appetite and fatigue are likely secondary to early developing uremia. Discussed with family medicine who will admit the patient, and nephrology who will set him up for dialysis.   Alvira Monday, MD 02/17/16 1954

## 2016-02-17 NOTE — H&P (Signed)
Family Medicine Teaching [facility] Admission History and Physical Service Pager: [phone]  Patient name: [name] Medical record number: [id number] Date of birth: 12/06/1950 Age: 65 y.o. Gender: male  Primary Care Provider: No PCP Per Patient Consultants: Nephrology Code Status: DNR  Chief Complaint: Shortness of breath  Assessment and Plan: [name] is a 65 y.o. male presenting with shortness of breath, in the context of missing dialysis for nearly 2 weeks per patient. PMH is significant for chronic systolic heart failure, end stage renal disease, HTN, and Type II DM.  End stage renal disease: Missed ~2 weeks of HD due to lack of transportation. Typically MWF HD schedule. K+ 5.7, BUN101, Hgb: 102.  Normal CXR. EKG with a.fib (known diagnosis). Trop 0.02. Cr 12.44. Phos 9.5. Fluid overloaded on physical exam, with 2+ pitting edema to LE bilaterally, however lungs clear to auscultation. Seen by Nephrology (Dr. Marisue Humble) in ED who recommends HD tonight.  - Admit to FPTS, attending Dr. McDiarmid  - Consulted nephrology;appreciate recs - Monitor K (should correct with HD) - AM BMP - F/u PTH - HD tonight - CLIP process started  Chronic systolic heart failure:  Previously was a patient of Dr. Gala Romney and most recent echo showed EF <15%. Not currently medically treated. Patient unsure of cardiologist in [place] and could not report last time he has been seen. EKG with a.fib, later transitioned to NSR. Currently fluid overloaded on physical exam, though more likely 2/2 missed HD rather than CHF exacerbation. - AM EKG - Consider beginning medical optimization   Type 2 DM:  Patient states that he has T2DM and was previously on insulin, but reports has not required it for two years.  CBG 105 on admission. - No basal insulin at this time; sensitive SSI - Check CBG ACHS - Check A1C  Paroxysmal atrial fibrillation:  Currently stable. Takes amiodarone  qd. A.fib on EKG in ED  with HR in 90s. -continue amiodarone -on tele, will monitor - AM EKG  FEN/GI: Modified carb/renal diet Prophylaxis: LMWH  Disposition: Pending clinical improvement   History of Present Illness:  [name] is a 65 y.o. male presenting with worsening shortness of breath after missing multiple HD sessions.   Patient was previously living in [place] and receiving MWF dialysis there, but returned to [place] after breaking up with his wife one to two weeks ago. As such, patient has not received HD in 1-2 weeks. His primary complaint today is SOB accompanied by weakness. SOB is worse with exertion. He typically has no problems with ambulation, but has had difficulty walking due to SOB over the past few days. Patient also endorses orthopnea, and has required multiple pillows to sleep for the past few nights. He reports anuria for the past ~2 weeks. He endorses LE edema and fatigue.   Patient has a history of diabetes and reports that he used to take insulin, but has not needed this in about 2 years. His last A1C was drawn he thinks 9 months ago, but he cannot remember the value. He does not check his CBG at home.   Patient has history of cocaine and crack cocaine use, but says he has not used for 3-4 years.    Review Of Systems: Per HPI with the following additions: denies chest pain, N/V/D/C Otherwise the remainder of the systems were negative.  Patient Active Problem List   Diagnosis Date Noted  . Shortness of breath   . Type 2 diabetes mellitus with diabetic nephropathy, without long-term  current use of insulin (HCC)   . End stage renal disease (HCC) 12/25/2014  . Atrial fibrillation [I48.91] 08/09/2014  . Encounter for therapeutic drug monitoring 08/09/2014  . Chronic systolic heart failure (HCC) 07/04/2014  . ESRD (end stage renal disease) (HCC) 07/04/2014  . PAF (paroxysmal atrial fibrillation) (HCC) 07/04/2014  . Controlled diabetes mellitus type II without complication (HCC)  06/22/2014  . Gout 06/22/2014  . ESRD needing dialysis (HCC)   . Pulmonary edema   . Bacteremia associated with intravascular line (HCC)   . Blood poisoning (HCC)   . Acute respiratory failure with hypoxia (HCC)   . AKI (acute kidney injury) (HCC)   . Screen for STD (sexually transmitted disease)   . Acute respiratory failure (HCC) 06/08/2014  . Cardiogenic shock (HCC) 06/08/2014  . Acute on chronic systolic congestive heart failure (HCC) 06/08/2014  . Acute on chronic renal failure (HCC) 06/08/2014  . Hyperkalemia 06/08/2014  . Respiratory failure (HCC) 06/08/2014    Past Medical History: Past Medical History  Diagnosis Date  . Diabetes mellitus without complication (HCC)   . Chronic systolic heart failure (HCC)     a) ECHO [facility] 02/2014): EF <15%, multiple apical thrombi, RV mod enlarged, mod MR b. RHC (02/21/14) at [facility]: RA 16, RV 64/9 (15), PA 64/32 (42), PCWP: 24, CI: 1.4  . HTN (hypertension)   . HLD (hyperlipidemia)   . CKD (chronic kidney disease), stage III   . Apical mural thrombus (HCC)   . Mitral regurgitation   . Drug use     cocaine  . Atrial fibrillation, chronic (HCC)   . Pneumonia   . Arthritis   . Anemia     Past Surgical History: Past Surgical History  Procedure Laterality Date  . Av fistula placement Right 06/27/2014    Procedure: ARTERIOVENOUS (AV) FISTULA CREATION VERSUS GRAFT INSERTION;  Surgeon: Pryor Ochoa, MD;  Location[facility] OR;  Service: Vascular;  Laterality: Right;  . Colonoscopy    . Insertion of dialysis catheter  12/15  . Fistula superficialization Right 10/25/2014    Procedure: RIGHT ARM RADIOCEPHALIC FISTULA SUPERFICIALIZATION;  Surgeon: Pryor Ochoa, MD;  Location: [facility] OR;  Service: Vascular;  Laterality: Right;    Social History: Social History  Substance Use Topics  . Smoking status: Never Smoker   . Smokeless tobacco: Never Used  . Alcohol Use: No     Comment: last usuage 1 yr. ago  Patient reports EtOH use about once a  month.  History of cocaine and crack cocaine use - clean for 3-4 years.   Family History: No significant family medical history.   Allergies and Medications: No Known Allergies No current facility-administered medications on file prior to encounter.   Current Outpatient Prescriptions on File Prior to Encounter  Medication Sig Dispense Refill  . acetaminophen (TYLENOL) 500 MG tablet Take 500 mg by mouth every 6 (six) hours as needed for moderate pain.    Marland Kitchen amiodarone (PACERONE) 200 MG tablet Take 1 tablet (200 mg total) by mouth daily. 30 tablet 6  . calcium acetate (PHOSLO) 667 MG capsule Take 667 mg by mouth daily.     . insulin NPH Human (HUMULIN N,NOVOLIN N) 100 UNIT/ML injection Inject 0.12 mLs (12 Units total) into the skin at bedtime. 10 mL 1  . insulin regular (NOVOLIN R,HUMULIN R) 100 units/mL injection Inject 0.06 mLs (6 Units total) into the skin 3 (three) times daily before meals. 10 mL 1  . warfarin (COUMADIN) 4 MG tablet Take  0.5 tablets (2 mg total) by mouth daily. 45 tablet 6  . oxyCODONE-acetaminophen (ROXICET) 5-325 MG per tablet Take 1 tablet by mouth every 6 (six) hours as needed for severe pain. (Patient not taking: Reported on 12/25/2014) 30 tablet 0    Objective: BP 109/61 mmHg  Pulse 91  Temp(Src) 97.7 F (36.5 C) (Oral)  Resp 15  Ht 6' (1.829 m)  Wt 248 lb 10.9 oz (112.8 kg)  BMI 33.72 kg/m2  SpO2 95% Exam: General: middle-aged gentleman lying down in bed, NAD Eyes: EOMI, non-injected ENTM: moist mucus membranes Neck: supple, no JVD Cardiovascular: RRR, s1/s2, no MRG  Respiratory: CTABL, normal work of breathing  Abdomen: soft, non-tender, non-distended, no apparent masses MSK: full range of motion Skin: flea bites in various stages of healing primarily to upper extremities, R-forearm fistula Neuro: AAOx3 Extremities: mild pitting edema in the lower extremities Psych: normal mood and affect  Labs and Imaging: CBC BMET   Recent Labs Lab  02/17/16 1131  WBC 6.6  HGB 11.7*  HCT 34.4*  PLT 156    Recent Labs Lab 02/17/16 1131  NA 137  K 5.7*  CL 94*  CO2 24  BUN 102*  CREATININE 12.44*  GLUCOSE 105*  CALCIUM 9.4      Marquette Saa, MD 02/17/2016, 6:07 PM PGY-1, [facility] FPTS Intern pager: [phone], text pages welcome  UPPER LEVEL ADDENDUM  I have read the above note and made revisions highlighted in orange.  Tarri Abernethy, MD, MPH PGY-2 [facility] Pager [phone]

## 2016-02-17 NOTE — ED Notes (Signed)
Gave pt water, per [place] - RN.

## 2016-02-17 NOTE — ED Notes (Signed)
Patient here with increasing weakness, shortness of breath and no dialysis x 2 weeks. Denies CP. Alert and oriented, speaking short phrases on assessment

## 2016-02-18 DIAGNOSIS — I480 Paroxysmal atrial fibrillation: Secondary | ICD-10-CM | POA: Insufficient documentation

## 2016-02-18 LAB — BASIC METABOLIC PANEL
Anion gap: 20 — ABNORMAL HIGH (ref 5–15)
BUN: 69 mg/dL — AB (ref 6–20)
CALCIUM: 9.6 mg/dL (ref 8.9–10.3)
CHLORIDE: 93 mmol/L — AB (ref 101–111)
CO2: 23 mmol/L (ref 22–32)
CREATININE: 9.84 mg/dL — AB (ref 0.61–1.24)
GFR calc Af Amer: 6 mL/min — ABNORMAL LOW (ref >60)
GFR calc non Af Amer: 5 mL/min — ABNORMAL LOW (ref >60)
GLUCOSE: 125 mg/dL — AB (ref 65–99)
Potassium: 4.1 mmol/L (ref 3.5–5.1)
Sodium: 136 mmol/L (ref 135–145)

## 2016-02-18 LAB — COMPREHENSIVE METABOLIC PANEL
ALBUMIN: 2.5 g/dL — AB (ref 3.5–5.0)
ALT: 16 U/L — ABNORMAL LOW (ref 17–63)
AST: 24 U/L (ref 15–41)
Alkaline Phosphatase: 97 U/L (ref 38–126)
Anion gap: 15 (ref 5–15)
BUN: 52 mg/dL — AB (ref 6–20)
CHLORIDE: 93 mmol/L — AB (ref 101–111)
CO2: 25 mmol/L (ref 22–32)
Calcium: 9 mg/dL (ref 8.9–10.3)
Creatinine, Ser: 7.69 mg/dL — ABNORMAL HIGH (ref 0.61–1.24)
GFR calc Af Amer: 7 mL/min — ABNORMAL LOW (ref >60)
GFR calc non Af Amer: 6 mL/min — ABNORMAL LOW (ref >60)
GLUCOSE: 133 mg/dL — AB (ref 65–99)
POTASSIUM: 3.3 mmol/L — AB (ref 3.5–5.1)
Sodium: 133 mmol/L — ABNORMAL LOW (ref 135–145)
Total Bilirubin: 7.6 mg/dL — ABNORMAL HIGH (ref 0.3–1.2)
Total Protein: 8 g/dL (ref 6.5–8.1)

## 2016-02-18 LAB — GAMMA GT: GGT: 123 U/L — AB (ref 7–50)

## 2016-02-18 LAB — CBC
HEMATOCRIT: 32.2 % — AB (ref 39.0–52.0)
Hemoglobin: 11 g/dL — ABNORMAL LOW (ref 13.0–17.0)
MCH: 30.1 pg (ref 26.0–34.0)
MCHC: 34.2 g/dL (ref 30.0–36.0)
MCV: 88 fL (ref 78.0–100.0)
PLATELETS: 139 10*3/uL — AB (ref 150–400)
RBC: 3.66 MIL/uL — ABNORMAL LOW (ref 4.22–5.81)
RDW: 16.2 % — AB (ref 11.5–15.5)
WBC: 5.5 10*3/uL (ref 4.0–10.5)

## 2016-02-18 LAB — HEPATITIS B SURFACE ANTIGEN: HEP B S AG: NEGATIVE

## 2016-02-18 LAB — LIPASE, BLOOD: Lipase: 36 U/L (ref 11–51)

## 2016-02-18 LAB — GLUCOSE, CAPILLARY
GLUCOSE-CAPILLARY: 118 mg/dL — AB (ref 65–99)
GLUCOSE-CAPILLARY: 172 mg/dL — AB (ref 65–99)
GLUCOSE-CAPILLARY: 132 mg/dL — AB (ref 65–99)
Glucose-Capillary: 80 mg/dL (ref 65–99)

## 2016-02-18 LAB — HEMOGLOBIN A1C
HEMOGLOBIN A1C: 6.4 % — AB (ref 4.8–5.6)
Mean Plasma Glucose: 137 mg/dL

## 2016-02-18 LAB — PARATHYROID HORMONE, INTACT (NO CA): PTH: 532 pg/mL — AB (ref 15–65)

## 2016-02-18 MED ORDER — HYDROXYZINE HCL 25 MG PO TABS
ORAL_TABLET | ORAL | Status: AC
  Filled 2016-02-18: qty 1

## 2016-02-18 MED ORDER — HYDROXYZINE HCL 25 MG PO TABS
25.0000 mg | ORAL_TABLET | Freq: Once | ORAL | Status: AC
Start: 2016-02-18 — End: 2016-02-18
  Administered 2016-02-18: 25 mg via ORAL

## 2016-02-18 MED ORDER — CALCITRIOL 0.5 MCG PO CAPS: 0.5000 ug | ORAL_CAPSULE | Freq: Every day | ORAL | Status: DC

## 2016-02-18 MED ORDER — SEVELAMER CARBONATE 800 MG PO TABS
2400.0000 mg | ORAL_TABLET | Freq: Three times a day (TID) | ORAL | Status: DC
  Administered 2016-02-19 – 2016-02-22 (×9): 2400 mg via ORAL
  Filled 2016-02-18 (×12): qty 3

## 2016-02-18 NOTE — Progress Notes (Signed)
Family Medicine Teaching Service Daily Progress Note Intern Pager: [phone]  Patient name: [name] Medical record number: [id number] Date of birth: 1950/08/29 Age: 65 y.o. Gender: male  Primary Care Provider: No PCP Per Patient Consultants: Nephrology Code Status: Full   Pt Overview and Major Events to Date:    Assessment and Plan: [name] is a 65 y.o. male presenting with shortness of breath, in the context of missing dialysis for nearly 2 weeks per patient. PMH is significant for chronic systolic heart failure, end stage renal disease, HTN, and Type II DM.  End stage renal disease: Missed ~2 weeks of HD due to lack of transportation. Typically MWF HD schedule. K+ 5.7, BUN101, Hgb: 102. Normal CXR. EKG with a.fib (known diagnosis). Trop 0.02. Cr 12.44. Phos 9.5. Fluid overloaded on physical exam, with 2+ pitting edema to LE bilaterally, however lungs clear to auscultation. Seen by Nephrology (Dr. Marisue Humble) in ED who recommends HD tonight.   - Monitor K (should correct with HD) - F/u PTH - CLIP process started  Chronic systolic heart failure: Previously was a patient of Dr. Gala Romney and most recent echo showed EF <15%. Not currently medically treated.Still fluid overloaded after last night HD session, will require more sessions to return to baseline. - AM EKG - Consider beginning medical optimization   Type 2 DM: Patient states that he has T2DM and was previously on insulin, but reports has not required it for two years. CBG 125 this morning. - No basal insulin at this time; sensitive SSI - Check CBG ACHS - Pending A1C  Paroxysmal atrial fibrillation: Currently stable. Takes amiodarone  qd. A.fib on EKG in ED with HR in 90s. -continue amiodarone -on tele, will monitor - AM EKG  FEN/GI: Modified carb/renal diet PPx: LMWH  Disposition: Home pending clinical improvement  Subjective:  Patient feeling better this morning after emergent HD session.  Patient endorses generalized puritus but denies weakness dizziness or shortness of breath.  Objective: Temp:  [97.4 F (36.3 C)-98.3 F (36.8 C)] 97.8 F (36.6 C) (07/11 0923) Pulse Rate:  [64-113] 98 (07/11 0923) Resp:  [14-27] 18 (07/11 0923) BP: (91-157)/(56-137) 96/76 mmHg (07/11 0923) SpO2:  [90 %-100 %] 100 % (07/11 0923) Weight:  [242 lb 1 oz (109.8 kg)-248 lb 10.9 oz (112.8 kg)] 242 lb 1 oz (109.8 kg) (07/10 1900)   Physical Exam: General: Pleasant gentleman, able to answer questions and participate in exam, NAD HEENT: Normocephalic atraumatic, PERRL, EOM intact, MMM Neck: supple, no ymphadenopathy Cardiovascular: Regular rate and rythm,no rubs gallops or murmur noted on exam, no JVP.  Respiratory: Moving air clearly bilaterally upper, diminished breath sound at the lung base bilaterally , no wheezes,no rales, no crackles Abdomen: non tender, non distended soft, normal bowel sound WUX:LKGMWN range of motion, +1 pitting edema Skin:Generalized pruritus, open sores from excessive scratching. Multiples sores across body at different stage of healing Neuro: cranial nerves intact,strength normal upper and lower extremities, Alert and Oriented x4 Psych: normal mood and affect  Laboratory:  Recent Labs Lab 02/17/16 1131  WBC 6.6  HGB 11.7*  HCT 34.4*  PLT 156    Recent Labs Lab 02/17/16 1131 02/18/16 0650  NA 137 136  K 5.7* 4.1  CL 94* 93*  CO2 24 23  BUN 102* 69*  CREATININE 12.44* 9.84*  CALCIUM 9.4 9.6  PROT 8.4*  --   BILITOT 7.7*  --   ALKPHOS 110  --   ALT 16*  --   AST 34  --  GLUCOSE 105* 125*    Imaging/Diagnostic Tests:  Chest 2 view: Cardiomegaly is stable. Overall cardiomediastinal silhouette is stable. Lungs are clear. Slight elevation of the right hemidiaphragm is stable. Osseous structures about the chest are unremarkable.  Presumed stimulator apparatus is seen along the left lateral chest wall, with catheter/wire projected anteriorly  and terminating within the subcutaneous soft tissues overlying the sternum.  IMPRESSION: 1. Cardiomegaly, stable. 2. Lungs are clear and there is no evidence of acute cardiopulmonary abnormality. No evidence of pneumonia. No pulmonary edema.  Lovena Neighbours, MD 02/18/2016, 9:56 AM PGY-1,  Family Medicine FPTS Intern pager: [phone], text pages welcome

## 2016-02-18 NOTE — Progress Notes (Signed)
Admit: 02/17/2016 LOS: 1  31M with ESRD and missed HD x2w presenting with hyperkalemia, SOB, azotemia. Needs new HD unit locally. Has sCHF/NICM.   Subjective:  HD yesterday: post weight 109kg, 3L UF; tolerated well No new events, CLIP pending   07/10 0701 - 07/11 0700 In: 240 [P.O.:240] Out: 3000   Filed Weights   02/17/16 1057 02/17/16 1546 02/17/16 1900  Weight: 112.401 kg (247 lb 12.8 oz) 112.8 kg (248 lb 10.9 oz) 109.8 kg (242 lb 1 oz)    Scheduled Meds: . amiodarone  200 mg Oral Daily  . aspirin EC  81 mg Oral Daily  . calcium acetate  667 mg Oral Q breakfast  . heparin  5,000 Units Subcutaneous Q8H  . insulin aspart  0-5 Units Subcutaneous QHS  . insulin aspart  0-9 Units Subcutaneous TID WC  . sodium chloride flush  3 mL Intravenous Q12H   Continuous Infusions:  PRN Meds:.sodium chloride, sodium chloride, alteplase, heparin, heparin, lidocaine (PF), lidocaine-prilocaine, pentafluoroprop-tetrafluoroeth  Current Labs: reviewed    Physical Exam:  Blood pressure 96/76, pulse 98, temperature 97.8 F (36.6 C), temperature source Tympanic, resp. rate 18, height 6' (1.829 m), weight 109.8 kg (242 lb 1 oz), SpO2 100 %. GEN: obese, nad ENT: NCAT EYES: EOMI CV: RRR no rub. PULM: diminished in bases, nl WOB ABD: s/nt/d SKIN: no rashes/lesions EXT:2+ LEE   Assessment  1. ESRD: no outpt HD unit locally 2. sCHF / NICM 3. SOB ; Hypervolemia 4. Hyperkalemia 5. Azotemia 6. Anemia, mild 7. RUE AVF 8. Hyperphosphatemia 9. Hyperparathyroidism PTH 532  Plan 1. HD again today, keep on THS Schedule: Full Tx, 3L UF goal 2. Bring down volume with serial EDW lowering 3. Start binder Sevelamer 3qAC 4. CLIP patient pending 5. No ESA needed   Sabra Heck MD 02/18/2016, 10:43 AM   Recent Labs Lab 02/17/16 1131 02/18/16 0650  NA 137 136  K 5.7* 4.1  CL 94* 93*  CO2 24 23  GLUCOSE 105* 125*  BUN 102* 69*  CREATININE 12.44* 9.84*  CALCIUM 9.4 9.6  PHOS 9.5*   --     Recent Labs Lab 02/17/16 1131  WBC 6.6  HGB 11.7*  HCT 34.4*  MCV 89.4  PLT 156

## 2016-02-19 ENCOUNTER — Inpatient Hospital Stay (HOSPITAL_COMMUNITY): Payer: Medicare HMO

## 2016-02-19 LAB — COMPREHENSIVE METABOLIC PANEL
ALBUMIN: 2.5 g/dL — AB (ref 3.5–5.0)
ALK PHOS: 104 U/L (ref 38–126)
ALT: 17 U/L (ref 17–63)
ANION GAP: 13 (ref 5–15)
AST: 26 U/L (ref 15–41)
BUN: 39 mg/dL — ABNORMAL HIGH (ref 6–20)
CALCIUM: 9 mg/dL (ref 8.9–10.3)
CO2: 26 mmol/L (ref 22–32)
Chloride: 93 mmol/L — ABNORMAL LOW (ref 101–111)
Creatinine, Ser: 7.35 mg/dL — ABNORMAL HIGH (ref 0.61–1.24)
GFR calc Af Amer: 8 mL/min — ABNORMAL LOW (ref >60)
GFR calc non Af Amer: 7 mL/min — ABNORMAL LOW (ref >60)
GLUCOSE: 187 mg/dL — AB (ref 65–99)
Potassium: 3.8 mmol/L (ref 3.5–5.1)
SODIUM: 132 mmol/L — AB (ref 135–145)
Total Bilirubin: 6.4 mg/dL — ABNORMAL HIGH (ref 0.3–1.2)
Total Protein: 7.9 g/dL (ref 6.5–8.1)

## 2016-02-19 LAB — BILIRUBIN, FRACTIONATED(TOT/DIR/INDIR)
BILIRUBIN INDIRECT: 3 mg/dL — AB (ref 0.3–0.9)
Bilirubin, Direct: 3.7 mg/dL — ABNORMAL HIGH (ref 0.1–0.5)
Total Bilirubin: 6.7 mg/dL — ABNORMAL HIGH (ref 0.3–1.2)

## 2016-02-19 LAB — GLUCOSE, CAPILLARY
GLUCOSE-CAPILLARY: 143 mg/dL — AB (ref 65–99)
GLUCOSE-CAPILLARY: 166 mg/dL — AB (ref 65–99)
GLUCOSE-CAPILLARY: 120 mg/dL — AB (ref 65–99)
Glucose-Capillary: 169 mg/dL — ABNORMAL HIGH (ref 65–99)
Glucose-Capillary: 138 mg/dL — ABNORMAL HIGH (ref 65–99)

## 2016-02-19 LAB — HIV ANTIBODY (ROUTINE TESTING W REFLEX): HIV SCREEN 4TH GENERATION: NONREACTIVE

## 2016-02-19 NOTE — Clinical Social Work Note (Signed)
Daughter Dolores Lory [phone]) requested that CSW call her. Talked with patient and he gave consent for CSW to call his daughter. Ms. Jomarie Longs concerned about patient being set-up for dialysis. Daughter explained that she has been in contact with [facility] and they are requesting clinical information (H&P). CSW talked with daughter about CLIP process and assured daughter that the HD Center receiving patient will get all of the information they need.  Daughter thanked CSW for talking with her.  Genelle Bal, MSW, LCSW Licensed Clinical Social Worker Clinical Social Work Department [employer] [phone]

## 2016-02-19 NOTE — Care Management Important Message (Signed)
Important Message  Patient Details  Name: [name] MRN: [id number] Date of Birth: 04-19-1951   Medicare Important Message Given:  Yes    Bernadette Hoit 02/19/2016, 11:12 AM

## 2016-02-19 NOTE — Progress Notes (Signed)
02/19/2016 4:09 PM Hemodialysis Outpatient Note; The 'CLIP" process was initiated 2days ago. The case manager from [id number] was able to connect with a nephew of Mr. Rizer who agreed to transport the patient to and from the dialysis center until SCAT could be arranged. I will update once we have a schedule. Thank you. Tilman Neat

## 2016-02-19 NOTE — Progress Notes (Signed)
[name] [facility] Family [facility] Teaching Service Daily Progress Note Intern Pager: [phone]  Patient name: [name] Medical record number: [id number] Date of birth: 1950-11-16 Age: 65 y.o. Gender: male  Primary Care Provider: No PCP Per Patient Consultants: Nephrology Code Status: Full   Pt Overview and Major Events to Date:  Emergent HD  Assessment and Plan: [name] is a 65 y.o. male presenting with shortness of breath, in the context of missing dialysis for nearly 2 weeks per patient. PMH is significant for chronic systolic heart failure, end stage renal disease, HTN, and Type II DM.  #End stage renal disease (ESRD): Continue to improve, nephrology is following. Being set up for CLIP. Will continue to monitor.  -- Continue to monitor K improving with dialysis.  -- CLIP process, pending  #Hyperbilirubinemia, ongoing Patient T. Bili is 6.4 this morning down from yesterday 7.6. Direct bili 3.7, indirect 3.0. GGT was 123 and elevated. Labs results are consistent with biliary duct obstruction/liver parenchymal disease. No jaundice seen on exam.  -- follow up on RUQ U/S  Chronic systolic heart failure: Previously was a patient of Dr. Gala Romney and most recent echo showed EF <15%. Not currently medically treated.Still fluid overloaded after last night HD session, will require more sessions to return to baseline.  -- Consider beginning medical optimization   Type 2 DM: Patient states that he has T2DM and was previously on insulin, but reports has not required it for two years.  - No basal insulin at this time; sensitive SSI - Check CBG ACHS - A1c 6.4 (pre diabetes), follow up with PCP  Paroxysmal atrial fibrillation: Currently stable. Takes amiodarone 200mg  qd. A.fib on EKG in ED with HR in 90s. --Continue amiodarone --On tele, will monitor  FEN/GI: Modified carb/renal diet PPx: LMWH  Disposition: Home pending clinical improvement  Subjective:  Patient feeling better  this morning after emergent HD session. Patient endorses generalized puritus but denies weakness dizziness or shortness of breath.  Objective: Temp:  [97.7 F (36.5 C)-98.2 F (36.8 C)] 97.7 F (36.5 C) (07/12 0818) Pulse Rate:  [79-100] 91 (07/12 0818) Resp:  [17-20] 18 (07/12 0818) BP: (93-112)/(48-88) 95/66 mmHg (07/12 0818) SpO2:  [93 %-100 %] 93 % (07/12 0818) Weight:  [234 lb 5.6 oz (106.3 kg)-234 lb 9.1 oz (106.4 kg)] 234 lb 5.6 oz (106.3 kg) (07/11 2146)   Physical Exam: General: Pleasant gentleman, able to answer questions and participate in exam, NAD HEENT: Normocephalic atraumatic, PERRL, EOM intact, MMM Neck: supple, no ymphadenopathy Cardiovascular: Regular rate and rythm,no rubs gallops or murmur noted on exam, no JVP.  Respiratory: Moving air clearly bilaterally upper, diminished breath sound at the lung base bilaterally , no wheezes,no rales, no crackles Abdomen: non tender, non distended soft, normal bowel sound IOE:VOJJKK range of motion, +1 pitting edema Skin:Generalized pruritus, open sores from excessive scratching. Multiples sores across body at different stage of healing Neuro: cranial nerves intact,strength normal upper and lower extremities, Alert and Oriented x4 Psych: normal mood and affect  Laboratory:  Recent Labs Lab 02/17/16 1131 02/18/16 1415  WBC 6.6 5.5  HGB 11.7* 11.0*  HCT 34.4* 32.2*  PLT 156 139*    Recent Labs Lab 02/17/16 1131 02/18/16 0650 02/18/16 1345 02/19/16 0432  NA 137 136 133* 132*  K 5.7* 4.1 3.3* 3.8  CL 94* 93* 93* 93*  CO2 24 23 25 26   BUN 102* 69* 52* 39*  CREATININE 12.44* 9.84* 7.69* 7.35*  CALCIUM 9.4 9.6 9.0 9.0  PROT 8.4*  --  8.0 7.9  BILITOT 7.7*  --  7.6* 6.7*  6.4*  ALKPHOS 110  --  97 104  ALT 16*  --  16* 17  AST 34  --  24 26  GLUCOSE 105* 125* 133* 187*    Imaging/Diagnostic Tests:  Chest 2 view: Cardiomegaly is stable. Overall cardiomediastinal silhouette is stable. Lungs are clear.  Slight elevation of the right hemidiaphragm is stable. Osseous structures about the chest are unremarkable.  Presumed stimulator apparatus is seen along the left lateral chest wall, with catheter/wire projected anteriorly and terminating within the subcutaneous soft tissues overlying the sternum.  IMPRESSION: 1. Cardiomegaly, stable. 2. Lungs are clear and there is no evidence of acute cardiopulmonary abnormality. No evidence of pneumonia. No pulmonary edema.  Lovena Neighbours, MD 02/19/2016, 3:33 PM PGY-1, [place] [facility] FPTS Intern pager: [phone], text pages welcome

## 2016-02-19 NOTE — Progress Notes (Signed)
Admit: 02/17/2016 LOS: 2  32M with ESRD and missed HD x2w presenting with hyperkalemia, SOB, azotemia. Needs new HD unit locally. Has sCHF/NICM.   Subjective:  HD yesterday: post weight 106.4kg, 2.8L UF; tolerated well No new events, CLIP pending   07/11 0701 - 07/12 0700 In: 600 [P.O.:600] Out: 2780   Filed Weights   02/18/16 1240 02/18/16 1651 02/18/16 2146  Weight: 109.3 kg (240 lb 15.4 oz) 106.4 kg (234 lb 9.1 oz) 106.3 kg (234 lb 5.6 oz)    Scheduled Meds: . amiodarone  200 mg Oral Daily  . aspirin EC  81 mg Oral Daily  . calcium acetate  667 mg Oral Q breakfast  . heparin  5,000 Units Subcutaneous Q8H  . insulin aspart  0-5 Units Subcutaneous QHS  . insulin aspart  0-9 Units Subcutaneous TID WC  . sevelamer carbonate  2,400 mg Oral TID WC  . sodium chloride flush  3 mL Intravenous Q12H   Continuous Infusions:  PRN Meds:.  Current Labs: reviewed    Physical Exam:  Blood pressure 95/66, pulse 91, temperature 97.7 F (36.5 C), temperature source Oral, resp. rate 18, height 6' (1.829 m), weight 106.3 kg (234 lb 5.6 oz), SpO2 93 %. GEN: obese, nad ENT: NCAT EYES: EOMI CV: RRR no rub. PULM: diminished in bases, nl WOB ABD: s/nt/d SKIN: no rashes/lesions EXT:2+ LEE   Assessment  1. ESRD: no outpt HD unit locally 2. sCHF / NICM 3. SOB ; Hypervolemia 4. Hyperkalemia 5. Azotemia 6. Anemia, mild 7. RUE AVF 8. Hyperphosphatemia 9. Hyperparathyroidism PTH 532  Plan 1. Keep on THS [place] 2. Await Clip 3. Serial lower EDW   Sabra Heck MD 02/19/2016, 9:28 AM   Recent Labs Lab 02/17/16 1131 02/18/16 0650 02/18/16 1345 02/19/16 0432  NA 137 136 133* 132*  K 5.7* 4.1 3.3* 3.8  CL 94* 93* 93* 93*  CO2 24 23 25 26   GLUCOSE 105* 125* 133* 187*  BUN 102* 69* 52* 39*  CREATININE 12.44* 9.84* 7.69* 7.35*  CALCIUM 9.4 9.6 9.0 9.0  PHOS 9.5*  --   --   --     Recent Labs Lab 02/17/16 1131 02/18/16 1415  WBC 6.6 5.5  HGB 11.7* 11.0*  HCT  34.4* 32.2*  MCV 89.4 88.0  PLT 156 139*

## 2016-02-20 ENCOUNTER — Encounter (HOSPITAL_COMMUNITY): Payer: Self-pay | Admitting: Family Medicine

## 2016-02-20 DIAGNOSIS — K7689 Other specified diseases of liver: Secondary | ICD-10-CM

## 2016-02-20 HISTORY — DX: Other specified diseases of liver: K76.89

## 2016-02-20 LAB — RENAL FUNCTION PANEL
ANION GAP: 16 — AB (ref 5–15)
Albumin: 2.5 g/dL — ABNORMAL LOW (ref 3.5–5.0)
BUN: 48 mg/dL — AB (ref 6–20)
CALCIUM: 9.3 mg/dL (ref 8.9–10.3)
CHLORIDE: 91 mmol/L — AB (ref 101–111)
CO2: 23 mmol/L (ref 22–32)
CREATININE: 8.41 mg/dL — AB (ref 0.61–1.24)
GFR calc non Af Amer: 6 mL/min — ABNORMAL LOW (ref >60)
GFR, EST AFRICAN AMERICAN: 7 mL/min — AB (ref >60)
GLUCOSE: 109 mg/dL — AB (ref 65–99)
Phosphorus: 5.7 mg/dL — ABNORMAL HIGH (ref 2.5–4.6)
Potassium: 3.8 mmol/L (ref 3.5–5.1)
Sodium: 130 mmol/L — ABNORMAL LOW (ref 135–145)

## 2016-02-20 LAB — FERRITIN: FERRITIN: 787 ng/mL — AB (ref 24–336)

## 2016-02-20 LAB — CBC
HCT: 31.9 % — ABNORMAL LOW (ref 39.0–52.0)
Hemoglobin: 10.9 g/dL — ABNORMAL LOW (ref 13.0–17.0)
MCH: 30.1 pg (ref 26.0–34.0)
MCHC: 34.2 g/dL (ref 30.0–36.0)
MCV: 88.1 fL (ref 78.0–100.0)
PLATELETS: 134 10*3/uL — AB (ref 150–400)
RBC: 3.62 MIL/uL — ABNORMAL LOW (ref 4.22–5.81)
RDW: 16.5 % — AB (ref 11.5–15.5)
WBC: 5.7 10*3/uL (ref 4.0–10.5)

## 2016-02-20 LAB — GLUCOSE, CAPILLARY
GLUCOSE-CAPILLARY: 132 mg/dL — AB (ref 65–99)
Glucose-Capillary: 123 mg/dL — ABNORMAL HIGH (ref 65–99)
Glucose-Capillary: 158 mg/dL — ABNORMAL HIGH (ref 65–99)

## 2016-02-20 LAB — IRON AND TIBC
Iron: 110 ug/dL (ref 45–182)
SATURATION RATIOS: 37 % (ref 17.9–39.5)
TIBC: 298 ug/dL (ref 250–450)
UIBC: 188 ug/dL

## 2016-02-20 MED ORDER — DARBEPOETIN ALFA 40 MCG/0.4ML IJ SOSY
PREFILLED_SYRINGE | INTRAMUSCULAR | Status: AC
  Filled 2016-02-20: qty 0.4

## 2016-02-20 MED ORDER — HEPARIN SODIUM (PORCINE) 1000 UNIT/ML DIALYSIS: 20.0000 [IU]/kg | INTRAMUSCULAR | Status: DC | PRN

## 2016-02-20 MED ORDER — DARBEPOETIN ALFA 40 MCG/0.4ML IJ SOSY
40.0000 ug | PREFILLED_SYRINGE | INTRAMUSCULAR | Status: DC
  Administered 2016-02-20: 40 ug via INTRAVENOUS

## 2016-02-20 MED ORDER — TRAZODONE HCL 50 MG PO TABS
50.0000 mg | ORAL_TABLET | Freq: Every day | ORAL | Status: DC
  Administered 2016-02-20 – 2016-02-21 (×2): 50 mg via ORAL
  Filled 2016-02-20: qty 1

## 2016-02-20 NOTE — Progress Notes (Signed)
Family Medicine Teaching Service Daily Progress Note Intern Pager: [phone]  Patient name: [name] Medical record number: [id number] Date of birth: June 16, 1951 Age: 66 y.o. Gender: male  Primary Care Provider: No PCP Per Patient Consultants: Nephrology Code Status: Full   Pt Overview and Major Events to Date:  Emergent HD  Assessment and Plan: [name] is a 65 y.o. male presenting with shortness of breath, in the context of missing dialysis for nearly 2 weeks per patient. PMH is significant for chronic systolic heart failure, end stage renal disease, HTN, and Type II DM.  #End stage renal disease (ESRD): Continue to improve, nephrology is following. Being set up for CLIP. Will continue to monitor.  -- HD this am.  -- CLIP process, pending  #Hyperbilirubinemia, ongoing  No jaundice seen on exam. RUQ US showed normal CBD, no liver parenchyma abnormalities, normal echogenicity.   --Recheck am bili  Chronic systolic heart failure: Previously was a patient of Dr. Gala [name] and most recent echo showed EF <15%. Not currently medically treated.Still fluid overloaded after last night HD session, will require more sessions to return to baseline.  -- Consider beginning medical optimization   Type 2 DM: Patient states that he has T2DM and was previously on insulin, but reports has not required it for two years.  - No basal insulin at this time; sensitive SSI - Check CBG ACHS - A1c 6.4 (pre diabetes), follow up with PCP  Paroxysmal atrial fibrillation: Currently stable. Takes amiodarone 200mg  qd. A.fib on EKG in ED with HR in 90s. --Continue amiodarone --On tele, will monitor  FEN/GI: Modified carb/renal diet PPx: LMWH  Disposition: Home pending clinical improvement  Subjective:  Patient feeling better this morning after emergent HD session. Patient endorses generalized puritus but denies weakness dizziness or shortness of breath.  Objective: Temp:  [97.3 F  (36.3 C)-98.3 F (36.8 C)] 97.3 F (36.3 C) (07/13 0817) Pulse Rate:  [51-102] 95 (07/13 0900) Resp:  [17-18] 18 (07/13 0817) BP: (80-109)/(56-81) 105/75 mmHg (07/13 0900) SpO2:  [95 %-98 %] 95 % (07/13 0555) Weight:  [233 lb 11 oz (106 kg)-241 lb 2.9 oz (109.4 kg)] 241 lb 2.9 oz (109.4 kg) (07/13 0817)   Physical Exam: General: Pleasant gentleman, able to answer questions and participate in exam, NAD HEENT: Normocephalic atraumatic, PERRL, EOM intact, MMM Neck: supple, no ymphadenopathy Cardiovascular: Regular rate and rythm,no rubs gallops or murmur noted on exam, no JVP.  Respiratory: Moving air clearly bilaterally upper, diminished breath sound at the lung base bilaterally , no wheezes,no rales, no crackles Abdomen: non tender, non distended soft, normal bowel sound ITG:PQDIYM range of motion, +1 pitting edema Skin:Generalized pruritus, open sores from excessive scratching. Multiples sores across body at different stage of healing Neuro: cranial nerves intact,strength normal upper and lower extremities, Alert and Oriented x4 Psych: normal mood and affect  Laboratory:  Recent Labs Lab 02/17/16 1131 02/18/16 1415 02/20/16 0829  WBC 6.6 5.5 5.7  HGB 11.7* 11.0* 10.9*  HCT 34.4* 32.2* 31.9*  PLT 156 139* 134*    Recent Labs Lab 02/17/16 1131  02/18/16 1345 02/19/16 0432 02/20/16 0828  NA 137  < > 133* 132* 130*  K 5.7*  < > 3.3* 3.8 3.8  CL 94*  < > 93* 93* 91*  CO2 24  < > 25 26 23   BUN 102*  < > 52* 39* 48*  CREATININE 12.44*  < > 7.69* 7.35* 8.41*  CALCIUM 9.4  < > 9.0 9.0 9.3  PROT 8.4*  --  8.0 7.9  --   BILITOT 7.7*  --  7.6* 6.7*  6.4*  --   ALKPHOS 110  --  97 104  --   ALT 16*  --  16* 17  --   AST 34  --  24 26  --   GLUCOSE 105*  < > 133* 187* 109*  < > = values in this interval not displayed.  Imaging/Diagnostic Tests:  Chest 2 view: Cardiomegaly is stable. Overall cardiomediastinal silhouette is stable. Lungs are clear. Slight elevation of  the right hemidiaphragm is stable. Osseous structures about the chest are unremarkable.  Presumed stimulator apparatus is seen along the left lateral chest wall, with catheter/wire projected anteriorly and terminating within the subcutaneous soft tissues overlying the sternum.  IMPRESSION: 1. Cardiomegaly, stable. 2. Lungs are clear and there is no evidence of acute cardiopulmonary abnormality. No evidence of pneumonia. No pulmonary edema.  RUQ [place]  Gallstones and internal debris noted within gallbladder. The largest gallstone measures 1 cm. There is minimal thickening of gallbladder wall up to 4 mm without sonographic Murphy's sign. Common bile duct: Diameter: 2 mm in diameter within normal limits.  Liver: No focal lesion identified. Within normal limits in parenchymal echogenicity. Right pleural effusion is noted.  IMPRESSION: 1. Gallstones and internal debris noted within gallbladder. The largest gallstone measures 1 cm. There is minimal thickening of gallbladder wall up to 4 mm without sonographic Murphy's sign. Normal CBD. Small right pleural effusion  [name] Neighbours, MD 02/20/2016, 9:24 AM PGY-1, [facility] FPTS Intern pager: [phone], text pages welcome

## 2016-02-20 NOTE — Procedures (Signed)
I was present at this dialysis session. I have reviewed the session itself and made appropriate changes.   4K bath.  UF goal 4L.  [place] AM K.  Pt w/o complaints. CLIP not complete. Using AVF.  Start ESA today.  Check TSAT.    Filed Weights   02/18/16 2146 02/19/16 2200 02/20/16 0817  Weight: 106.3 kg (234 lb 5.6 oz) 106 kg (233 lb 11 oz) 109.4 kg (241 lb 2.9 oz)     Recent Labs Lab 02/17/16 1131  02/19/16 0432  NA 137  < > 132*  K 5.7*  < > 3.8  CL 94*  < > 93*  CO2 24  < > 26  GLUCOSE 105*  < > 187*  BUN 102*  < > 39*  CREATININE 12.44*  < > 7.35*  CALCIUM 9.4  < > 9.0  PHOS 9.5*  --   --   < > = values in this interval not displayed.   Recent Labs Lab 02/17/16 1131 02/18/16 1415 02/20/16 0829  WBC 6.6 5.5 5.7  HGB 11.7* 11.0* 10.9*  HCT 34.4* 32.2* 31.9*  MCV 89.4 88.0 88.1  PLT 156 139* 134*    Scheduled Meds: . amiodarone  200 mg Oral Daily  . aspirin EC  81 mg Oral Daily  . calcium acetate  667 mg Oral Q breakfast  . heparin  5,000 Units Subcutaneous Q8H  . insulin aspart  0-5 Units Subcutaneous QHS  . insulin aspart  0-9 Units Subcutaneous TID WC  . sevelamer carbonate  2,400 mg Oral TID WC  . sodium chloride flush  3 mL Intravenous Q12H   Continuous Infusions:  PRN Meds:.heparin   Sabra Heck  MD 02/20/2016, 8:51 AM

## 2016-02-21 LAB — GLUCOSE, CAPILLARY
GLUCOSE-CAPILLARY: 138 mg/dL — AB (ref 65–99)
GLUCOSE-CAPILLARY: 145 mg/dL — AB (ref 65–99)
GLUCOSE-CAPILLARY: 163 mg/dL — AB (ref 65–99)
Glucose-Capillary: 146 mg/dL — ABNORMAL HIGH (ref 65–99)

## 2016-02-21 LAB — PROTIME-INR
INR: 1.55 — ABNORMAL HIGH (ref 0.00–1.49)
PROTHROMBIN TIME: 18.7 s — AB (ref 11.6–15.2)

## 2016-02-21 MED ORDER — POLYETHYLENE GLYCOL 3350 17 G PO PACK
17.0000 g | PACK | Freq: Every day | ORAL | Status: DC
  Administered 2016-02-22: 17 g via ORAL
  Filled 2016-02-21: qty 1

## 2016-02-21 NOTE — Discharge Summary (Signed)
Family Medicine Teaching [facility] Discharge Summary  Patient name: [name] Medical record number: [id number] Date of birth: 1950-08-15 Age: 65 y.o. Gender: male Date of Admission: 02/17/2016  Date of Discharge: 02/22/16 Admitting Physician: Leighton Roach McDiarmid, MD  Primary Care Provider: No PCP Per Patient Consultants: Neurology  Indication for Hospitalization: Shortness of breath, weakness  Discharge Diagnoses/Problem List:  Patient Active Problem List   Diagnosis Date Noted  . Intrahepatic cholestasis 02/20/2016  . Direct hyperbilirubinemia   . Paroxysmal atrial fibrillation (HCC)   . Shortness of breath   . Type 2 diabetes mellitus with diabetic nephropathy, without long-term current use of insulin (HCC)   . End stage renal disease (HCC) 12/25/2014  . Atrial fibrillation [I48.91] 08/09/2014  . Encounter for therapeutic drug monitoring 08/09/2014  . Chronic systolic heart failure (HCC) 07/04/2014  . ESRD (end stage renal disease) (HCC) 07/04/2014  . PAF (paroxysmal atrial fibrillation) (HCC) 07/04/2014  . Controlled diabetes mellitus type II without complication (HCC) 06/22/2014  . Gout 06/22/2014  . ESRD needing dialysis (HCC)   . Pulmonary edema   . Bacteremia associated with intravascular line (HCC)   . Blood poisoning (HCC)   . Acute respiratory failure with hypoxia (HCC)   . AKI (acute kidney injury) (HCC)   . Screen for STD (sexually transmitted disease)   . Acute respiratory failure (HCC) 06/08/2014  . Cardiogenic shock (HCC) 06/08/2014  . Acute on chronic systolic congestive heart failure (HCC) 06/08/2014  . Acute on chronic renal failure (HCC) 06/08/2014  . Hyperkalemia 06/08/2014  . Respiratory failure (HCC) 06/08/2014     Disposition: Home  Discharge Condition: Stable, improved  Discharge Exam:  General: Pleasant gentleman, able to answer questions and participate in exam, NAD HEENT: Normocephalic atraumatic, PERRL, EOM intact, MMM Neck:  supple, no ymphadenopathy Cardiovascular: Regular rate and rythm,no rubs gallops or murmur noted on exam, no JVP.  Respiratory: CTABL, no wheezes,no rales, crackles in LLL Abdomen: non tender, non distended soft, normal bowel sound WUJ:WJXBJY range of motion, +1 pitting edema in lower extremities Skin: Generalized pruritus, open sores from excessive scratching. Multiples sores across body at different stage of healing Neuro: cranial nerves intact,strength normal upper and lower extremities, Alert and Oriented x3 Psych: normal mood and affect  Brief Hospital Course:  [name] is a 65 y.o. male presenting with shortness of breath, in the context of missing dialysis for nearly 2 weeks per patient. PMH is significant for chronic systolic heart failure, end stage renal disease, HTN, and Type II DM.  #End stage renal disease (ESRD): At the time of admission patient had not had dialysis in 1-2 weeks and was short of breath and weak.  He had a mild hyperkalemia to 5.7 without EKG changes.  He was able to get HD the night of admission and has now received several courses of HD.  He is set up to get dialysis with [facility] and will f/u for outpatient dialysis.  Plan to f/u with Nephrology outpatient as well.  Plan to discharge after dialysis today.    #Hyperbilirubinemia, ongoing No jaundice seen on exam. RUQ US showed normal CBD, no liver parenchyma abnormalities, normal echogenicity. Patient states that he does not take tylenol very often and does not take oxycodone despite it being on his med list. We never ordered tylenol or oxy since his admission. Likely to be do to cholestasis of an unclear etiology. No stones seen outside of gallbladder or CBD distension. Trending down now, Tbili: 7.6>6.4. Patient will follow up  with primary care doctor and can have a recheck CMP at visit.   Chronic systolic heart failure: Previously was a patient of Dr. Gala Romney and most recent echo showed EF <15%. Not  currently medically treated. Unclear if patient was getting Cardiology follow up while in [place].  Was initially fluid overloaded on exam and improved during admission. EKG intermittently showed Afib that transitioned to NSR. Patient will try to reestablish with Dr. Gala Romney.    Type 2 DM: Patient states that he has T2DM and was previously on insulin, but reports has not required it for two years. CBGs in the low 100s. . A1C was 6.4.  Did not require any insulin while admitted.  Should follow up with PCP.   Paroxysmal atrial fibrillation: Currently stable. Takes amiodarone 200mg  daily. A.fib on EKG in ED with HR in 90s and was intermittently in Afib that would convert to NSR. Stable and asymptomatic while admitted. Follow up with Cardiologist.   Issues for Follow Up:  1. Dialysis:  Patient has been accepted at [facility] and will f/u as outpatient.  2. Chronic systolic heart failure- patient needs to follow up with Cardiologist.  Previously saw Dr. Gala Romney.  Can try to reestablish with him.   3. Patient needs post-admission follow up with primary doctor and to discuss general health maintenance and to recheck CMP for liver panel and f/u on TSH.   Significant Procedures: None  Significant Labs and Imaging:   Recent Labs Lab 02/18/16 1415 02/20/16 0829 02/22/16 0728  WBC 5.5 5.7 5.0  HGB 11.0* 10.9* 11.2*  HCT 32.2* 31.9* 33.4*  PLT 139* 134* 138*    Recent Labs Lab 02/17/16 1131 02/18/16 0650 02/18/16 1345 02/19/16 0432 02/20/16 0828 02/22/16 0728  NA 137 136 133* 132* 130* 132*  K 5.7* 4.1 3.3* 3.8 3.8 4.4  CL 94* 93* 93* 93* 91* 91*  CO2 24 23 25 26 23 24   GLUCOSE 105* 125* 133* 187* 109* 145*  BUN 102* 69* 52* 39* 48* 35*  CREATININE 12.44* 9.84* 7.69* 7.35* 8.41* 7.76*  CALCIUM 9.4 9.6 9.0 9.0 9.3 9.8  PHOS 9.5*  --   --   --  5.7* 5.2*  ALKPHOS 110  --  97 104  --   --   AST 34  --  24 26  --   --   ALT 16*  --  16* 17  --   --   ALBUMIN 2.8*  --  2.5* 2.5* 2.5* 2.4*      Results/Tests Pending at Time of Discharge: None  Discharge Medications:    Medication List    TAKE these medications        acetaminophen 500 MG tablet  Commonly known as:  TYLENOL  Take 500 mg by mouth every 6 (six) hours as needed for moderate pain.     amiodarone 200 MG tablet  Commonly known as:  PACERONE  Take 1 tablet (200 mg total) by mouth daily.     aspirin 81 MG tablet  Take 81 mg by mouth daily.     calcium acetate 667 MG capsule  Commonly known as:  PHOSLO  Take 667 mg by mouth daily.     insulin NPH Human 100 UNIT/ML injection  Commonly known as:  HUMULIN N,NOVOLIN N  Inject 0.12 mLs (12 Units total) into the skin at bedtime.     insulin regular 100 units/mL injection  Commonly known as:  NOVOLIN R,HUMULIN R  Inject 0.06 mLs (6 Units total) into  the skin 3 (three) times daily before meals.     oxyCODONE-acetaminophen 5-325 MG tablet  Commonly known as:  ROXICET  Take 1 tablet by mouth every 6 (six) hours as needed for severe pain.     polyethylene glycol packet  Commonly known as:  MIRALAX / GLYCOLAX  Take 17 g by mouth daily.     sevelamer carbonate 800 MG tablet  Commonly known as:  RENVELA  Take 3 tablets (2,400 mg total) by mouth 3 (three) times daily with meals.        Discharge Instructions: Please refer to Patient Instructions section of EMR for full details.  Patient was counseled important signs and symptoms that should prompt return to medical care, changes in medications, dietary instructions, activity restrictions, and follow up appointments.   Follow-Up Appointments: Follow-up Information    Follow up with [facility], DO. Go on 02/27/2016.   Specialty:  Family Medicine   Why:  hospital f/u at 2:00PM   Contact information:   [address] [place] [address] [phone]       Renne Musca, MD 02/22/2016, 9:11 PM PGY-1, [facility] Family [facility]

## 2016-02-21 NOTE — Care Management Important Message (Signed)
Important Message  Patient Details  Name: Clevie Do MRN: [id number] Date of Birth: [birth year]   Medicare Important Message Given:  Yes    Kyla Balzarine 02/21/2016, 11:46 AM

## 2016-02-21 NOTE — Progress Notes (Signed)
Patient had 6 beats of Vtach on tele monitor. Patient has been having frequent PVC's throughout the day. Pt asymptomatic. On call resident for family medicine notified. No new orders. Patient has outpatient cardiology follow up appt scheduled.   Leanna Battles, RN.

## 2016-02-21 NOTE — Progress Notes (Signed)
Patient ID: Callister Kieschnick, male   DOB: 1951/01/05, 65 y.o.   MRN: [id number] S:Doesn't feel well today O:BP 94/64 mmHg  Pulse 93  Temp(Src) 97.6 F (36.4 C) (Oral)  Resp 18  Ht 6' (1.829 m)  Wt 107.4 kg (236 lb 12.4 oz)  BMI 32.11 kg/m2  SpO2 100%  Intake/Output Summary (Last 24 hours) at 02/21/16 0950 Last data filed at 02/21/16 0926  Gross per 24 hour  Intake    860 ml  Output   4000 ml  Net  -3140 ml   Intake/Output: I/O last 3 completed shifts: In: 470 [P.O.:470] Out: 4000 [Other:4000]  Intake/Output this shift:  Total I/O In: 480 [P.O.:480] Out: -  Weight change: 3.4 kg (7 lb 7.9 oz) Gen:WD obese AAM in NAd CVS:no rub Resp:occ rhonchi EFE:OFHQRF Ext: +edema lower ext and RAVF +T/B   Recent Labs Lab 02/17/16 1131 02/18/16 0650 02/18/16 1345 02/19/16 0432 02/20/16 0828  NA 137 136 133* 132* 130*  K 5.7* 4.1 3.3* 3.8 3.8  CL 94* 93* 93* 93* 91*  CO2 24 23 25 26 23   GLUCOSE 105* 125* 133* 187* 109*  BUN 102* 69* 52* 39* 48*  CREATININE 12.44* 9.84* 7.69* 7.35* 8.41*  ALBUMIN 2.8*  --  2.5* 2.5* 2.5*  CALCIUM 9.4 9.6 9.0 9.0 9.3  PHOS 9.5*  --   --   --  5.7*  AST 34  --  24 26  --   ALT 16*  --  16* 17  --    Liver Function Tests:  Recent Labs Lab 02/17/16 1131 02/18/16 1345 02/19/16 0432 02/20/16 0828  AST 34 24 26  --   ALT 16* 16* 17  --   ALKPHOS 110 97 104  --   BILITOT 7.7* 7.6* 6.7*  6.4*  --   PROT 8.4* 8.0 7.9  --   ALBUMIN 2.8* 2.5* 2.5* 2.5*    Recent Labs Lab 02/18/16 1345  LIPASE 36   No results for input(s): AMMONIA in the last 168 hours. CBC:  Recent Labs Lab 02/17/16 1131 02/18/16 1415 02/20/16 0829  WBC 6.6 5.5 5.7  HGB 11.7* 11.0* 10.9*  HCT 34.4* 32.2* 31.9*  MCV 89.4 88.0 88.1  PLT 156 139* 134*   Cardiac Enzymes: No results for input(s): CKTOTAL, CKMB, CKMBINDEX, TROPONINI in the last 168 hours. CBG:  Recent Labs Lab 02/19/16 2243 02/20/16 1301 02/20/16 1652 02/20/16 2204 02/21/16 0754  GLUCAP  138* 132* 123* 158* 138*    Iron Studies:  Recent Labs  02/20/16 1015  IRON 110  TIBC 298  FERRITIN 787*   Studies/Results: US Abdomen Limited Ruq  02/19/2016  CLINICAL DATA:  Hyperbilirubinemia, pancreatitis EXAM: US ABDOMEN LIMITED - RIGHT UPPER QUADRANT COMPARISON:  None. FINDINGS: Gallbladder: Gallstones and internal debris noted within gallbladder. The largest gallstone measures 1 cm. There is minimal thickening of gallbladder wall up to 4 mm without sonographic Murphy's sign. Common bile duct: Diameter: 2 mm in diameter within normal limits. Liver: No focal lesion identified. Within normal limits in parenchymal echogenicity. Right pleural effusion is noted. IMPRESSION: 1. Gallstones and internal debris noted within gallbladder. The largest gallstone measures 1 cm. There is minimal thickening of gallbladder wall up to 4 mm without sonographic Murphy's sign. Normal CBD. Small right pleural effusion Electronically Signed   By: Natasha Mead M.D.   On: 02/19/2016 16:32   . amiodarone  200 mg Oral Daily  . aspirin EC  81 mg Oral Daily  . calcium acetate  667 mg Oral Q breakfast  . darbepoetin (ARANESP) injection - DIALYSIS  40 mcg Intravenous Q Thu-HD  . heparin  5,000 Units Subcutaneous Q8H  . insulin aspart  0-5 Units Subcutaneous QHS  . insulin aspart  0-9 Units Subcutaneous TID WC  . sevelamer carbonate  2,400 mg Oral TID WC  . sodium chloride flush  3 mL Intravenous Q12H  . traZODone  50 mg Oral QHS    BMET    Component Value Date/Time   NA 130* 02/20/2016 0828   K 3.8 02/20/2016 0828   CL 91* 02/20/2016 0828   CO2 23 02/20/2016 0828   GLUCOSE 109* 02/20/2016 0828   BUN 48* 02/20/2016 0828   CREATININE 8.41* 02/20/2016 0828   CALCIUM 9.3 02/20/2016 0828   GFRNONAA 6* 02/20/2016 0828   GFRAA 7* 02/20/2016 0828   CBC    Component Value Date/Time   WBC 5.7 02/20/2016 0829   RBC 3.62* 02/20/2016 0829   HGB 10.9* 02/20/2016 0829   HCT 31.9* 02/20/2016 0829   PLT 134*  02/20/2016 0829   MCV 88.1 02/20/2016 0829   MCH 30.1 02/20/2016 0829   MCHC 34.2 02/20/2016 0829   RDW 16.5* 02/20/2016 0829   LYMPHSABS 1.2 09/13/2014 0810   MONOABS 0.8 09/13/2014 0810   EOSABS 0.0 09/13/2014 0810   BASOSABS 0.0 09/13/2014 0810     Assessment/Plan:  1. Noncompliance with HD- now on TTS schedule and awaiting outpatient HD to be arranged.  Has been accepted to [facility] so can f/u as an outpatient and discharge tomorrow after HD if stable.  Stressed importance of compliance with HD 2. NICMP 3. Systolic CHF 4. Anemia of chronic kidney disease 5. 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 Disposition- outpatient HD at [facility] TTS once medically stable for discharge  Irena Cords

## 2016-02-21 NOTE — Progress Notes (Signed)
Family Medicine Teaching Service Daily Progress Note Intern Pager: [phone]  Patient name: [name] Medical record number: [id number] Date of birth: 1951/01/15 Age: 65 y.o. Gender: male  Primary Care Provider: No PCP Per Patient Consultants: Nephrology Code Status: Full   Pt Overview and Major Events to Date:  Emergent HD  Assessment and Plan: Datrell Jafri is a 65 y.o. male presenting with shortness of breath, in the context of missing dialysis for nearly 2 weeks per patient. PMH is significant for chronic systolic heart failure, end stage renal disease, HTN, and Type II DM.  #End stage renal disease (ESRD): Continue to improve, nephrology is following. Being set up for CLIP. Will continue to monitor.   -- HD yesterday AM. -- CLIP process, pending  #Hyperbilirubinemia, ongoing  No jaundice seen on exam. RUQ US showed normal CBD, no liver parenchyma abnormalities, normal echogenicity.  Patient states that he does not take tylenol very often and does not take oxycodone despite it being on his med list.  We never ordered tylenol or oxy since his admission.  Likely to be do to cholestasis of an unclear etiology.  No stones seen outside of gallbladder or CBD distension.  Trending down now, Tbili: 7.6>6.4.  --consider rechecking bili  Chronic systolic heart failure: Previously was a patient of Dr. Gala Romney and most recent echo showed EF <15%. Not currently medically treated.Still fluid overloaded after last night HD session, will require more sessions to return to baseline. -- Consider beginning medical optimization   Type 2 DM: Patient states that he has T2DM and was previously on insulin, but reports has not required it for two years.  CBG is 109 this AM.  - No basal insulin at this time; sensitive SSI - Check CBG ACHS - A1c 6.4 (pre diabetes), follow up with PCP  Paroxysmal atrial fibrillation: Currently stable. Takes amiodarone 200mg  qd. A.fib on EKG in ED with HR in  90s. --Continue amiodarone --On tele, will monitor  FEN/GI: Modified carb/renal diet PPx: LMWH  Disposition: Home pending clinical improvement and CLIP   Subjective:  Patient feeling well this AM with no complaints overnight.  No SOB, no weakness.  On recheck at 11:30AM patient states that he has weakness and on exam there were crackles in his LLL, he denies any SOB.   Objective: Temp:  [97.5 F (36.4 C)-98.6 F (37 C)] 97.6 F (36.4 C) (07/14 0803) Pulse Rate:  [85-103] 93 (07/14 0803) Resp:  [16-19] 18 (07/14 0803) BP: (82-104)/(64-76) 94/64 mmHg (07/14 0804) SpO2:  [93 %-100 %] 100 % (07/14 0803) Weight:  [231 lb 14.8 oz (105.2 kg)-236 lb 12.4 oz (107.4 kg)] 236 lb 12.4 oz (107.4 kg) (07/13 2205)   Physical Exam: General: Pleasant gentleman, able to answer questions and participate in exam, NAD HEENT: Normocephalic atraumatic, PERRL, EOM intact, MMM Neck: supple, no ymphadenopathy Cardiovascular: Regular rate and rythm,no rubs gallops or murmur noted on exam, no JVP.  Respiratory: CTABL, no wheezes,no rales, crackles in LLL Abdomen: non tender, non distended soft, normal bowel sound KXF:GHWEXH range of motion, +1 pitting edema in lower extremities Skin: Generalized pruritus, open sores from excessive scratching. Multiples sores across body at different stage of healing Neuro: cranial nerves intact,strength normal upper and lower extremities, Alert and Oriented x3 Psych: normal mood and affect  Laboratory:  Recent Labs Lab 02/17/16 1131 02/18/16 1415 02/20/16 0829  WBC 6.6 5.5 5.7  HGB 11.7* 11.0* 10.9*  HCT 34.4* 32.2* 31.9*  PLT 156 139* 134*    Recent  Labs Lab 02/17/16 1131  02/18/16 1345 02/19/16 0432 02/20/16 0828  NA 137  < > 133* 132* 130*  K 5.7*  < > 3.3* 3.8 3.8  CL 94*  < > 93* 93* 91*  CO2 24  < > BUN 102*  < > 52* 39* 48*  CREATININE 12.44*  < > 7.69* 7.35* 8.41*  CALCIUM 9.4  < > 9.0 9.0 9.3  PROT 8.4*  --  8.0 7.9  --   BILITOT  7.7*  --  7.6* 6.7*  6.4*  --   ALKPHOS 110  --  97 104  --   ALT 16*  --  16* 17  --   AST 34  --  24 26  --   GLUCOSE 105*  < > 133* 187* 109*  < > = values in this interval not displayed.  Imaging/Diagnostic Tests:  Chest 2 view: Cardiomegaly is stable. Overall cardiomediastinal silhouette is stable. Lungs are clear. Slight elevation of the right hemidiaphragm is stable. Osseous structures about the chest are unremarkable.  Presumed stimulator apparatus is seen along the left lateral chest wall, with catheter/wire projected anteriorly and terminating within the subcutaneous soft tissues overlying the sternum.  IMPRESSION: 1. Cardiomegaly, stable. 2. Lungs are clear and there is no evidence of acute cardiopulmonary abnormality. No evidence of pneumonia. No pulmonary edema.  RUQ [place]  Gallstones and internal debris noted within gallbladder. The largest gallstone measures 1 cm. There is minimal thickening of gallbladder wall up to 4 mm without sonographic Murphy's sign. Common bile duct: Diameter: 2 mm in diameter within normal limits.  Liver: No focal lesion identified. Within normal limits in parenchymal echogenicity. Right pleural effusion is noted.  IMPRESSION: 1. Gallstones and internal debris noted within gallbladder. The largest gallstone measures 1 cm. There is minimal thickening of gallbladder wall up to 4 mm without sonographic Murphy's sign. Normal CBD. Small right pleural effusion  Renne Musca, MD 02/21/2016, 12:04 PM PGY-1, [facility] Family Medicine FPTS Intern pager: [phone], text pages welcome

## 2016-02-22 LAB — RENAL FUNCTION PANEL
ANION GAP: 17 — AB (ref 5–15)
Albumin: 2.4 g/dL — ABNORMAL LOW (ref 3.5–5.0)
BUN: 35 mg/dL — AB (ref 6–20)
CHLORIDE: 91 mmol/L — AB (ref 101–111)
CO2: 24 mmol/L (ref 22–32)
Calcium: 9.8 mg/dL (ref 8.9–10.3)
Creatinine, Ser: 7.76 mg/dL — ABNORMAL HIGH (ref 0.61–1.24)
GFR calc Af Amer: 7 mL/min — ABNORMAL LOW (ref >60)
GFR, EST NON AFRICAN AMERICAN: 6 mL/min — AB (ref >60)
Glucose, Bld: 145 mg/dL — ABNORMAL HIGH (ref 65–99)
PHOSPHORUS: 5.2 mg/dL — AB (ref 2.5–4.6)
POTASSIUM: 4.4 mmol/L (ref 3.5–5.1)
Sodium: 132 mmol/L — ABNORMAL LOW (ref 135–145)

## 2016-02-22 LAB — GLUCOSE, CAPILLARY
GLUCOSE-CAPILLARY: 156 mg/dL — AB (ref 65–99)
Glucose-Capillary: 142 mg/dL — ABNORMAL HIGH (ref 65–99)

## 2016-02-22 LAB — CBC
HEMATOCRIT: 33.4 % — AB (ref 39.0–52.0)
HEMOGLOBIN: 11.2 g/dL — AB (ref 13.0–17.0)
MCH: 30 pg (ref 26.0–34.0)
MCHC: 33.5 g/dL (ref 30.0–36.0)
MCV: 89.5 fL (ref 78.0–100.0)
Platelets: 138 10*3/uL — ABNORMAL LOW (ref 150–400)
RBC: 3.73 MIL/uL — AB (ref 4.22–5.81)
RDW: 16.9 % — AB (ref 11.5–15.5)
WBC: 5 10*3/uL (ref 4.0–10.5)

## 2016-02-22 LAB — TSH: TSH: 1.99 u[IU]/mL (ref 0.350–4.500)

## 2016-02-22 MED ORDER — SEVELAMER CARBONATE 800 MG PO TABS: 2400.0000 mg | ORAL_TABLET | Freq: Three times a day (TID) | ORAL | Status: AC

## 2016-02-22 MED ORDER — HEPARIN SODIUM (PORCINE) 1000 UNIT/ML DIALYSIS: 20.0000 [IU]/kg | INTRAMUSCULAR | Status: DC | PRN

## 2016-02-22 MED ORDER — POLYETHYLENE GLYCOL 3350 17 G PO PACK: 17.0000 g | PACK | Freq: Every day | ORAL | Status: DC

## 2016-02-22 NOTE — Progress Notes (Signed)
Patient ID: [name], male   DOB: 1950-10-04, 65 y.o.   MRN: [id number] I was present at this dialysis session. I have reviewed the session itself and made appropriate changes.   Filed Weights   02/20/16 2205 02/21/16 2058 02/22/16 0700  Weight: 107.4 kg (236 lb 12.4 oz) 107.51 kg (237 lb 0.3 oz) 107.2 kg (236 lb 5.3 oz)     Recent Labs Lab 02/22/16 0728  NA 132*  K 4.4  CL 91*  CO2 24  GLUCOSE 145*  BUN 35*  CREATININE 7.76*  CALCIUM 9.8  PHOS 5.2*     Recent Labs Lab 02/18/16 1415 02/20/16 0829 02/22/16 0728  WBC 5.5 5.7 5.0  HGB 11.0* 10.9* 11.2*  HCT 32.2* 31.9* 33.4*  MCV 88.0 88.1 89.5  PLT 139* 134* 138*    Scheduled Meds: . amiodarone  200 mg Oral Daily  . aspirin EC  81 mg Oral Daily  . calcium acetate  667 mg Oral Q breakfast  . darbepoetin (ARANESP) injection - DIALYSIS  40 mcg Intravenous Q Thu-HD  . heparin  5,000 Units Subcutaneous Q8H  . insulin aspart  0-5 Units Subcutaneous QHS  . insulin aspart  0-9 Units Subcutaneous TID WC  . polyethylene glycol  17 g Oral Daily  . sevelamer carbonate  2,400 mg Oral TID WC  . sodium chloride flush  3 mL Intravenous Q12H  . traZODone  50 mg Oral QHS   Continuous Infusions:  PRN Meds:.heparin   Irena Cords,  MD 02/22/2016, 9:36 AM

## 2016-02-22 NOTE — Progress Notes (Signed)
Central telemetry notified this RN that patient had 2 runs of Vtach. Pt asymptomatic. On call intern notified. Awaiting orders.  Veatrice Kells, RN

## 2016-02-22 NOTE — Progress Notes (Signed)
Central telemetry notified this RN that patient had 6 beats of Vtach. Patient asymptomatic. On call resident notified. Awaiting orders. RN will continue to monitor patient.  Veatrice Kells, RN

## 2016-02-22 NOTE — Progress Notes (Signed)
02/22/2016 11:21 AM Hemodialysis Outpatient Note; [name] has been accepted at the [facility] on a Tuesday, Thursday and Saturday 2nd shift schedule. The center can begin treatment on Tuesday July 18th at 9:00AM. Thank you. Tilman Neat

## 2016-02-22 NOTE — Discharge Instructions (Signed)
You were admitted to the hospital with shortness of breath and weakness after not having dialysis for a couple of weeks.  You have been receiving dialysis three times per week and you have been improving while you were here.  We have set you up for outpatient dialysis at [facility] on [address] in [place], [place].  Phone number is: [phone]   We highly recommend that you reestablish care with your former Cardiologist, Dr. Gala Romney and continue to receive dialysis three times per week and follow up with a Nephrologist and your primary care doctor.

## 2016-02-27 ENCOUNTER — Inpatient Hospital Stay: Payer: Medicare HMO | Admitting: Family Medicine

## 2016-04-16 ENCOUNTER — Telehealth: Payer: Self-pay | Admitting: Cardiovascular Disease

## 2016-04-16 NOTE — Telephone Encounter (Signed)
Received records from [facility] for appointment on 05/06/16 with Dr Royann Shivers.  Records given to [facility] (medical records) for Dr Croitoru's schedule on 05/06/16. lp

## 2016-04-30 ENCOUNTER — Emergency Department (HOSPITAL_COMMUNITY)
Admission: EM | Admit: 2016-04-30 | Discharge: 2016-04-30 | Disposition: A | Discharge status: Home / Self Care | Payer: Medicare HMO | Attending: Emergency Medicine | Admitting: Emergency Medicine

## 2016-04-30 ENCOUNTER — Encounter (HOSPITAL_COMMUNITY): Payer: Self-pay | Admitting: Family Medicine

## 2016-04-30 DIAGNOSIS — Z7982 Long term (current) use of aspirin: Secondary | ICD-10-CM | POA: Insufficient documentation

## 2016-04-30 DIAGNOSIS — E1121 Type 2 diabetes mellitus with diabetic nephropathy: Secondary | ICD-10-CM | POA: Insufficient documentation

## 2016-04-30 DIAGNOSIS — Z794 Long term (current) use of insulin: Secondary | ICD-10-CM | POA: Insufficient documentation

## 2016-04-30 DIAGNOSIS — E1122 Type 2 diabetes mellitus with diabetic chronic kidney disease: Secondary | ICD-10-CM | POA: Insufficient documentation

## 2016-04-30 DIAGNOSIS — N186 End stage renal disease: Secondary | ICD-10-CM | POA: Insufficient documentation

## 2016-04-30 DIAGNOSIS — I5023 Acute on chronic systolic (congestive) heart failure: Secondary | ICD-10-CM | POA: Diagnosis not present

## 2016-04-30 DIAGNOSIS — I132 Hypertensive heart and chronic kidney disease with heart failure and with stage 5 chronic kidney disease, or end stage renal disease: Secondary | ICD-10-CM | POA: Insufficient documentation

## 2016-04-30 DIAGNOSIS — Z79899 Other long term (current) drug therapy: Secondary | ICD-10-CM | POA: Diagnosis not present

## 2016-04-30 LAB — CBC WITH DIFFERENTIAL/PLATELET
Basophils Absolute: 0 10*3/uL (ref 0.0–0.1)
Basophils Relative: 1 %
Eosinophils Absolute: 0.2 10*3/uL (ref 0.0–0.7)
Eosinophils Relative: 4 %
HCT: 38.2 % — ABNORMAL LOW (ref 39.0–52.0)
Hemoglobin: 12.8 g/dL — ABNORMAL LOW (ref 13.0–17.0)
Lymphocytes Relative: 15 %
Lymphs Abs: 0.9 10*3/uL (ref 0.7–4.0)
MCH: 30.5 pg (ref 26.0–34.0)
MCHC: 33.5 g/dL (ref 30.0–36.0)
MCV: 91 fL (ref 78.0–100.0)
Monocytes Absolute: 0.9 10*3/uL (ref 0.1–1.0)
Monocytes Relative: 15 %
Neutro Abs: 3.7 10*3/uL (ref 1.7–7.7)
Neutrophils Relative %: 65 %
Platelets: 93 10*3/uL — ABNORMAL LOW (ref 150–400)
RBC: 4.2 MIL/uL — ABNORMAL LOW (ref 4.22–5.81)
RDW: 17.1 % — ABNORMAL HIGH (ref 11.5–15.5)
WBC: 5.7 10*3/uL (ref 4.0–10.5)

## 2016-04-30 LAB — BASIC METABOLIC PANEL
Anion gap: 19 — ABNORMAL HIGH (ref 5–15)
BUN: 68 mg/dL — ABNORMAL HIGH (ref 6–20)
CO2: 24 mmol/L (ref 22–32)
Calcium: 9.2 mg/dL (ref 8.9–10.3)
Chloride: 93 mmol/L — ABNORMAL LOW (ref 101–111)
Creatinine, Ser: 12.12 mg/dL — ABNORMAL HIGH (ref 0.61–1.24)
GFR calc Af Amer: 4 mL/min — ABNORMAL LOW (ref >60)
GFR calc non Af Amer: 4 mL/min — ABNORMAL LOW (ref >60)
Glucose, Bld: 156 mg/dL — ABNORMAL HIGH (ref 65–99)
Potassium: 3.8 mmol/L (ref 3.5–5.1)
Sodium: 136 mmol/L (ref 135–145)

## 2016-04-30 LAB — CBG MONITORING, ED: Glucose-Capillary: 177 mg/dL — ABNORMAL HIGH (ref 65–99)

## 2016-04-30 LAB — I-STAT TROPONIN, ED: Troponin i, poc: 0.03 ng/mL (ref 0.00–0.08)

## 2016-04-30 NOTE — ED Provider Notes (Signed)
[facility]-EMERGENCY DEPT Provider Note   CSN: [id number] Arrival date & time: 04/30/16  1223     History   Chief Complaint Chief Complaint  Patient presents with  . Vascular Access Problem    HPI [name] is a 65 y.o. male.  HPI   65 year old male presents today with complaints of this. Patient end-stage renal disease patient currently receiving outpatient dialysis that [facility]. He reports he gets dialysis Tuesday Wednesday Saturday, notes that his last dialysis was Saturday ( 5 days ago). Patient reports developing generalized fatigue yesterday with very minimal shortness of breath. He reports this is typical of his presentation when he hasn't received dialysis. Patient reports the reason he didn't go to dialysis was that he was feeling okay and thought he could skip a few.    Past Medical History:  Diagnosis Date  . Anemia   . Apical mural thrombus (HCC)   . Arthritis   . Atrial fibrillation, chronic (HCC)   . Chronic systolic heart failure (HCC)    a) ECHO [facility] 02/2014): EF <15%, multiple apical thrombi, RV mod enlarged, mod MR b. RHC (02/21/14) at [facility]: RA 16, RV 64/9 (15), PA 64/32 (42), PCWP: 24, CI: 1.4  . CKD (chronic kidney disease), stage III   . Diabetes mellitus without complication (HCC)   . Drug use    cocaine  . HLD (hyperlipidemia)   . HTN (hypertension)   . Intrahepatic cholestasis 02/20/2016  . Mitral regurgitation   . Pneumonia     Patient Active Problem List   Diagnosis Date Noted  . Intrahepatic cholestasis 02/20/2016  . Direct hyperbilirubinemia   . Paroxysmal atrial fibrillation (HCC)   . Shortness of breath   . Type 2 diabetes mellitus with diabetic nephropathy, without long-term current use of insulin (HCC)   . End stage renal disease (HCC) 12/25/2014  . Atrial fibrillation [I48.91] 08/09/2014  . Encounter for therapeutic drug monitoring 08/09/2014  . Chronic systolic heart failure (HCC) 07/04/2014  . ESRD (end  stage renal disease) (HCC) 07/04/2014  . PAF (paroxysmal atrial fibrillation) (HCC) 07/04/2014  . Controlled diabetes mellitus type II without complication (HCC) 06/22/2014  . Gout 06/22/2014  . ESRD needing dialysis (HCC)   . Pulmonary edema   . Bacteremia associated with intravascular line (HCC)   . Blood poisoning (HCC)   . Acute respiratory failure with hypoxia (HCC)   . AKI (acute kidney injury) (HCC)   . Screen for STD (sexually transmitted disease)   . Acute respiratory failure (HCC) 06/08/2014  . Cardiogenic shock (HCC) 06/08/2014  . Acute on chronic systolic congestive heart failure (HCC) 06/08/2014  . Acute on chronic renal failure (HCC) 06/08/2014  . Hyperkalemia 06/08/2014  . Respiratory failure (HCC) 06/08/2014    Past Surgical History:  Procedure Laterality Date  . AV FISTULA PLACEMENT Right 06/27/2014   Procedure: ARTERIOVENOUS (AV) FISTULA CREATION VERSUS GRAFT INSERTION;  Surgeon: Pryor Ochoa, MD;  Location: [facility] OR;  Service: Vascular;  Laterality: Right;  . COLONOSCOPY    . FISTULA SUPERFICIALIZATION Right 10/25/2014   Procedure: RIGHT ARM RADIOCEPHALIC FISTULA SUPERFICIALIZATION;  Surgeon: Pryor Ochoa, MD;  Location: [facility] OR;  Service: Vascular;  Laterality: Right;  . INSERTION OF DIALYSIS CATHETER  12/15       Home Medications    Prior to Admission medications   Medication Sig Start Date End Date Taking? Authorizing Provider  acetaminophen (TYLENOL) 500 MG tablet Take 500 mg by mouth every 6 (six) hours as needed for  moderate pain.    Historical Provider, MD  amiodarone (PACERONE) 200 MG tablet Take 1 tablet (200 mg total) by mouth daily. 08/23/14   Amy D Filbert Schilderlegg, NP  aspirin 81 MG tablet Take 81 mg by mouth daily.    Historical Provider, MD  calcium acetate (PHOSLO) 667 MG capsule Take 667 mg by mouth daily.     Historical Provider, MD  insulin NPH Human (HUMULIN N,NOVOLIN N) 100 UNIT/ML injection Inject 0.12 mLs (12 Units total) into the skin at bedtime.  09/13/14   Margarita Grizzleanielle Ray, MD  insulin regular (NOVOLIN R,HUMULIN R) 100 units/mL injection Inject 0.06 mLs (6 Units total) into the skin 3 (three) times daily before meals. 09/13/14   Margarita Grizzleanielle Ray, MD  oxyCODONE-acetaminophen (ROXICET) 5-325 MG per tablet Take 1 tablet by mouth every 6 (six) hours as needed for severe pain. Patient not taking: Reported on 12/25/2014 10/25/14   Raymond GurneyKimberly A Trinh, PA-C  polyethylene glycol (MIRALAX / GLYCOLAX) packet Take 17 g by mouth daily. 02/22/16   Almon Herculesaye T Gonfa, MD  sevelamer carbonate (RENVELA) 800 MG tablet Take 3 tablets (2,400 mg total) by mouth 3 (three) times daily with meals. 02/22/16   Almon Herculesaye T Gonfa, MD    Family History No family history on file.  Social History Social History  Substance Use Topics  . Smoking status: Never Smoker  . Smokeless tobacco: Never Used  . Alcohol use No     Comment: last usuage 1 yr. ago     Allergies   Review of patient's allergies indicates no known allergies.   Review of Systems Review of Systems  All other systems reviewed and are negative.    Physical Exam Updated Vital Signs BP 107/86   Pulse 94   Temp 97.7 F (36.5 C) (Oral)   Resp 16   Ht 6' (1.829 m)   Wt 116.3 kg   SpO2 98%   BMI 34.79 kg/m   Physical Exam  Constitutional: He is oriented to person, place, and time. He appears well-developed and well-nourished.  HENT:  Head: Normocephalic and atraumatic.  Eyes: Conjunctivae are normal. Pupils are equal, round, and reactive to light. Right eye exhibits no discharge. Left eye exhibits no discharge. No scleral icterus.  Neck: Normal range of motion. No JVD present. No tracheal deviation present.  Pulmonary/Chest: Effort normal. No stridor.  Neurological: He is alert and oriented to person, place, and time. Coordination normal.  Psychiatric: He has a normal mood and affect. His behavior is normal. Judgment and thought content normal.  Nursing note and vitals reviewed.    ED Treatments / Results   Labs (all labs ordered are listed, but only abnormal results are displayed) Labs Reviewed  CBG MONITORING, ED - Abnormal; Notable for the following:       Result Value   Glucose-Capillary 177 (*)    All other components within normal limits  CBC WITH DIFFERENTIAL/PLATELET  BASIC METABOLIC PANEL    EKG  EKG Interpretation  Date/Time:  Thursday April 30 2016 12:32:32 EDT Ventricular Rate:  97 PR Interval:    QRS Duration: 121 QT Interval:  394 QTC Calculation: 501 R Axis:   0 Text Interpretation:  Sinus rhythm Nonspecific intraventricular conduction delay Consider anterior infarct Nonspecific T abnormalities, lateral leads Baseline wander in lead(s) V3 No significant change since last tracing Confirmed by Ethelda ChickJACUBOWITZ  MD, SAM ([id number]) on 04/30/2016 1:05:59 PM       Radiology No results found.  Procedures Procedures (including critical care time)  Medications  Ordered in ED Medications - No data to display   Initial Impression / Assessment and Plan / ED Course  I have reviewed the triage vital signs and the nursing notes.  Pertinent labs & imaging results that were available during my care of the patient were reviewed by me and considered in my medical decision making (see chart for details).  Clinical Course      Final Clinical Impressions(s) / ED Diagnoses   Final diagnoses:  ESRD (end stage renal disease) (HCC)    Patient presents with reports fatigue. Patient missed dialysis. Labs show no significant abnormalities that require emergent dialysis here in the ED. Nephrology consult at, outpatient dialysis arrangements made. Patient verbalizes understanding and agreement today's plan had no further questions or concerns. New Prescriptions New Prescriptions   No medications on file     Eyvonne Mechanic, PA-C 05/01/16 1659    Doug Sou, MD 05/01/16 [phone]

## 2016-04-30 NOTE — ED Provider Notes (Signed)
Complains of generalized weakness since today. He denies any shortness of breath. He admits to noncompliance with hemodialysis. His last hemodialysis session was 5 days ago. Patient is alert and in no distress. Glasgow Coma Score 15   Doug Sou, MD 04/30/16 1754

## 2016-04-30 NOTE — Care Management (Signed)
ED CM spoke with [facility], updated on the care transitional plan for patient to attend Outpatient HD tomorrow. CM verified this information with [facility] as well. Updated patient who then reports that his long term goal is to get into a ALF explained to patient he can follow up with SW at HD. Patient provided  CM with permission to contact SW on patient's behalf concerning this information. CM spoke with Jasmine December SW who requested list of ALF be faxed, to [phone], fax confirmation received. ED CM provide patient with list and instructed  patient to follow up with SW at HD unit tomorrow. Patient verbalized understanding teach back done. No further ED CM needs identified.Updated Brook RN on [employer].

## 2016-04-30 NOTE — Discharge Instructions (Signed)
Please follow-up with Dialysis center at 11:45 as directed. Please return to the emergency room as needed.

## 2016-04-30 NOTE — ED Triage Notes (Signed)
Pt presents from home via [facility] to request Dialysis - he is living in a motel right now to be closer to his mother who is ill and in a SNF.  Last Dialysis was Saturday. He does have dialysis set up here in [place], just has not gone for several days.

## 2016-04-30 NOTE — ED Notes (Signed)
Nephrology at bedside. States patient may receive dialysis from [facility] tomorrow at 1145. Dr. Shela Commons aware.

## 2016-04-30 NOTE — Discharge Planning (Signed)
EDCM confirmed with Alana ([facility])that pt CAN NOT receive HD today at his home facility.  His next scheduled HD is Saturday at 1:30

## 2016-04-30 NOTE — ED Notes (Signed)
CBG 177. 

## 2016-04-30 NOTE — ED Notes (Signed)
Pt given turkey sandwich and sprite.  

## 2016-04-30 NOTE — ED Notes (Signed)
Pt sees [facility] for dialysis - usual time is 1330. Per [facility] management, may be able to take him today.

## 2016-04-30 NOTE — Discharge Planning (Signed)
EDCM contacted [facility] to find that pt has a chair at [facility] [phone]  Pt is scheduled for HD today at 1330.  Alana will contact center to see if he can be worked in.  EDCM will update RN when information becomes available.

## 2016-04-30 NOTE — ED Notes (Signed)
Contacted care management and social work regarding patient's living situation and need for a dialysis home.

## 2016-05-01 ENCOUNTER — Emergency Department (HOSPITAL_COMMUNITY)
Admission: EM | Admit: 2016-05-01 | Discharge: 2016-05-02 | Disposition: A | Discharge status: Home / Self Care | Payer: Medicare HMO | Source: Home / Self Care | Attending: Emergency Medicine | Admitting: Emergency Medicine

## 2016-05-01 ENCOUNTER — Encounter (HOSPITAL_COMMUNITY): Payer: Self-pay

## 2016-05-01 DIAGNOSIS — Z992 Dependence on renal dialysis: Secondary | ICD-10-CM | POA: Insufficient documentation

## 2016-05-01 DIAGNOSIS — N186 End stage renal disease: Secondary | ICD-10-CM

## 2016-05-01 DIAGNOSIS — I5022 Chronic systolic (congestive) heart failure: Secondary | ICD-10-CM

## 2016-05-01 DIAGNOSIS — Z5900 Homelessness unspecified: Secondary | ICD-10-CM

## 2016-05-01 DIAGNOSIS — I132 Hypertensive heart and chronic kidney disease with heart failure and with stage 5 chronic kidney disease, or end stage renal disease: Secondary | ICD-10-CM | POA: Insufficient documentation

## 2016-05-01 DIAGNOSIS — Z791 Long term (current) use of non-steroidal anti-inflammatories (NSAID): Secondary | ICD-10-CM | POA: Insufficient documentation

## 2016-05-01 DIAGNOSIS — E1122 Type 2 diabetes mellitus with diabetic chronic kidney disease: Secondary | ICD-10-CM

## 2016-05-01 DIAGNOSIS — Z794 Long term (current) use of insulin: Secondary | ICD-10-CM

## 2016-05-01 DIAGNOSIS — Z79899 Other long term (current) drug therapy: Secondary | ICD-10-CM

## 2016-05-01 LAB — BASIC METABOLIC PANEL
Anion gap: 15 (ref 5–15)
BUN: 55 mg/dL — ABNORMAL HIGH (ref 6–20)
CO2: 27 mmol/L (ref 22–32)
Calcium: 8.7 mg/dL — ABNORMAL LOW (ref 8.9–10.3)
Chloride: 94 mmol/L — ABNORMAL LOW (ref 101–111)
Creatinine, Ser: 9.65 mg/dL — ABNORMAL HIGH (ref 0.61–1.24)
GFR calc Af Amer: 6 mL/min — ABNORMAL LOW (ref >60)
GFR calc non Af Amer: 5 mL/min — ABNORMAL LOW (ref >60)
Glucose, Bld: 192 mg/dL — ABNORMAL HIGH (ref 65–99)
Potassium: 3.8 mmol/L (ref 3.5–5.1)
Sodium: 136 mmol/L (ref 135–145)

## 2016-05-01 LAB — CBC WITH DIFFERENTIAL/PLATELET
Basophils Absolute: 0.1 10*3/uL (ref 0.0–0.1)
Basophils Relative: 1 %
Eosinophils Absolute: 0.2 10*3/uL (ref 0.0–0.7)
Eosinophils Relative: 3 %
HCT: 34.6 % — ABNORMAL LOW (ref 39.0–52.0)
Hemoglobin: 12.1 g/dL — ABNORMAL LOW (ref 13.0–17.0)
Lymphocytes Relative: 13 %
Lymphs Abs: 0.8 10*3/uL (ref 0.7–4.0)
MCH: 30.7 pg (ref 26.0–34.0)
MCHC: 35 g/dL (ref 30.0–36.0)
MCV: 87.8 fL (ref 78.0–100.0)
Monocytes Absolute: 0.6 10*3/uL (ref 0.1–1.0)
Monocytes Relative: 11 %
Neutro Abs: 4.2 10*3/uL (ref 1.7–7.7)
Neutrophils Relative %: 72 %
Platelets: 94 10*3/uL — ABNORMAL LOW (ref 150–400)
RBC: 3.94 MIL/uL — ABNORMAL LOW (ref 4.22–5.81)
RDW: 16.9 % — ABNORMAL HIGH (ref 11.5–15.5)
WBC: 5.9 10*3/uL (ref 4.0–10.5)

## 2016-05-01 NOTE — ED Provider Notes (Signed)
[facility]-EMERGENCY DEPT Provider Note   CSN: [id number] Arrival date & time: 05/01/16  2220     History   Chief Complaint Chief Complaint  Patient presents with  . Fatigue    HPI [name] is a 65 y.o. male.  HPI   65 year old male presents today with complaints of fatigue. Patient was seen yesterday by myself in the emergency room with similar presentation. Patient has a dialysis patient Tuesday Wednesday Saturday. He has not had dialysis since last Saturday. His laboratory analysis yesterday was reassuring, nephrology was consult at who scheduled him an appointment this morning for dialysis. Patient reports that after leaving the emergency room, he did not have a ride and one is unable to get back to the telemetry was staying. He was unable to find his way back to the hospital. Patient did not receive dialysis today. Patient reports generalized fatigue, denies any specific pain or neurological deficits.   Past Medical History:  Diagnosis Date  . Anemia   . Apical mural thrombus (HCC)   . Arthritis   . Atrial fibrillation, chronic (HCC)   . Chronic systolic heart failure (HCC)    a) ECHO [facility] 02/2014): EF <15%, multiple apical thrombi, RV mod enlarged, mod MR b. RHC (02/21/14) at [facility]: RA 16, RV 64/9 (15), PA 64/32 (42), PCWP: 24, CI: 1.4  . CKD (chronic kidney disease), stage III   . Diabetes mellitus without complication (HCC)   . Drug use    cocaine  . HLD (hyperlipidemia)   . HTN (hypertension)   . Intrahepatic cholestasis 02/20/2016  . Mitral regurgitation   . Pneumonia     Patient Active Problem List   Diagnosis Date Noted  . Intrahepatic cholestasis 02/20/2016  . Direct hyperbilirubinemia   . Paroxysmal atrial fibrillation (HCC)   . Shortness of breath   . Type 2 diabetes mellitus with diabetic nephropathy, without long-term current use of insulin (HCC)   . End stage renal disease (HCC) 12/25/2014  . Atrial fibrillation [I48.91] 08/09/2014  . Encounter for  therapeutic drug monitoring 08/09/2014  . Chronic systolic heart failure (HCC) 07/04/2014  . ESRD (end stage renal disease) (HCC) 07/04/2014  . PAF (paroxysmal atrial fibrillation) (HCC) 07/04/2014  . Controlled diabetes mellitus type II without complication (HCC) 06/22/2014  . Gout 06/22/2014  . ESRD needing dialysis (HCC)   . Pulmonary edema   . Bacteremia associated with intravascular line (HCC)   . Blood poisoning (HCC)   . Acute respiratory failure with hypoxia (HCC)   . AKI (acute kidney injury) (HCC)   . Screen for STD (sexually transmitted disease)   . Acute respiratory failure (HCC) 06/08/2014  . Cardiogenic shock (HCC) 06/08/2014  . Acute on chronic systolic congestive heart failure (HCC) 06/08/2014  . Acute on chronic renal failure (HCC) 06/08/2014  . Hyperkalemia 06/08/2014  . Respiratory failure (HCC) 06/08/2014    Past Surgical History:  Procedure Laterality Date  . AV FISTULA PLACEMENT Right 06/27/2014   Procedure: ARTERIOVENOUS (AV) FISTULA CREATION VERSUS GRAFT INSERTION;  Surgeon: Pryor Ochoa, MD;  Location: [facility] OR;  Service: Vascular;  Laterality: Right;  . COLONOSCOPY    . FISTULA SUPERFICIALIZATION Right 10/25/2014   Procedure: RIGHT ARM RADIOCEPHALIC FISTULA SUPERFICIALIZATION;  Surgeon: Pryor Ochoa, MD;  Location: [facility] OR;  Service: Vascular;  Laterality: Right;  . INSERTION OF DIALYSIS CATHETER  12/15     Home Medications    Prior to Admission medications   Medication Sig Start Date End Date Taking? Authorizing Provider  acetaminophen (TYLENOL) 500 MG tablet Take 500 mg by mouth every 6 (six) hours as needed for moderate pain.    Historical Provider, MD  amiodarone (PACERONE) 200 MG tablet Take 1 tablet (200 mg total) by mouth daily. 08/23/14   Amy D Filbert Schilder, NP  aspirin 325 MG tablet Take 325 mg by mouth daily.    Historical Provider, MD  atorvastatin (LIPITOR) 20 MG tablet Take 20 mg by mouth at bedtime.    Historical Provider, MD  insulin NPH Human  (HUMULIN N,NOVOLIN N) 100 UNIT/ML injection Inject 0.12 mLs (12 Units total) into the skin at bedtime. Patient not taking: Reported on 04/30/2016 09/13/14   Margarita Grizzle, MD  insulin regular (NOVOLIN R,HUMULIN R) 100 units/mL injection Inject 0.06 mLs (6 Units total) into the skin 3 (three) times daily before meals. Patient not taking: Reported on 04/30/2016 09/13/14   Margarita Grizzle, MD  oxyCODONE-acetaminophen (ROXICET) 5-325 MG per tablet Take 1 tablet by mouth every 6 (six) hours as needed for severe pain. Patient not taking: Reported on 04/30/2016 10/25/14   Raymond Gurney, PA-C  polyethylene glycol (MIRALAX / GLYCOLAX) packet Take 17 g by mouth daily. Patient not taking: Reported on 04/30/2016 02/22/16   Almon Hercules, MD  sevelamer carbonate (RENVELA) 800 MG tablet Take 3 tablets (2,400 mg total) by mouth 3 (three) times daily with meals. Patient taking differently: Take 800-1,600 mg by mouth See admin instructions. Take 1600 mg by mouth 3 times daily with meals and take 800 mg by mouth with snacks. 02/22/16   Almon Hercules, MD    Family History History reviewed. No pertinent family history.  Social History Social History  Substance Use Topics  . Smoking status: Never Smoker  . Smokeless tobacco: Never Used  . Alcohol use No     Comment: last usuage 1 yr. ago     Allergies   Review of patient's allergies indicates no known allergies.   Review of Systems Review of Systems  All other systems reviewed and are negative.    Physical Exam Updated Vital Signs BP 105/77 (BP Location: Left Arm)   Pulse 100   Temp 98.2 F (36.8 C) (Oral)   Resp 18   SpO2 98%   Physical Exam  Constitutional: He is oriented to person, place, and time. He appears well-developed and well-nourished.  HENT:  Head: Normocephalic and atraumatic.  Eyes: Conjunctivae are normal. Pupils are equal, round, and reactive to light. Right eye exhibits no discharge. Left eye exhibits no discharge. No scleral icterus.    Neck: Normal range of motion. No JVD present. No tracheal deviation present.  Pulmonary/Chest: Effort normal. No stridor.  Neurological: He is alert and oriented to person, place, and time. Coordination normal.  Psychiatric: He has a normal mood and affect. His behavior is normal. Judgment and thought content normal.  Nursing note and vitals reviewed.   ED Treatments / Results  Labs (all labs ordered are listed, but only abnormal results are displayed) Labs Reviewed  CBC WITH DIFFERENTIAL/PLATELET - Abnormal; Notable for the following:       Result Value   RBC 3.94 (*)    Hemoglobin 12.1 (*)    HCT 34.6 (*)    RDW 16.9 (*)    Platelets 94 (*)    All other components within normal limits  BASIC METABOLIC PANEL - Abnormal; Notable for the following:    Chloride 94 (*)    Glucose, Bld 192 (*)    BUN 55 (*)  Creatinine, Ser 9.65 (*)    Calcium 8.7 (*)    GFR calc non Af Amer 5 (*)    GFR calc Af Amer 6 (*)    All other components within normal limits    EKG  EKG Interpretation None       Radiology No results found.  Procedures Procedures (including critical care time)  Medications Ordered in ED Medications - No data to display   Initial Impression / Assessment and Plan / ED Course  I have reviewed the triage vital signs and the nursing notes.  Pertinent labs & imaging results that were available during my care of the patient were reviewed by me and considered in my medical decision making (see chart for details).  Clinical Course     Final Clinical Impressions(s) / ED Diagnoses   Final diagnoses:  Homeless   Labs:  Imaging:  Consults:  Therapeutics:  Discharge Meds:   Assessment/Plan:  65 year old male presents today with reports of fatigue. Patient reports he was unable to make it back to his hotel today where all of his stuff is at. He reports that he no longer has a room there has his stay is now up. [facility] and unable to make it to scheduled dialysis appointment. Patient reports he has nowhere to go and came back to the emergency room for evaluation. After food and drink here in the ED patient reports significant improvement in his fatigue. His laboratory analysis shows no acute signs or symptoms that would necessitate emergent dialysis. Due to it being late in the evening, patient with no vertigo, homeless shelters likely on accepting people at this time patient will be allowed to sleep here in the emergency room until tomorrow morning. Patient would need to contact his dialysis Center and schedule dialysis tomorrow.     New Prescriptions New Prescriptions   No medications on file     Eyvonne MechanicJeffrey Katrianna Friesenhahn, PA-C 05/02/16 0138    Eyvonne MechanicJeffrey Simmie Garin, PA-C 05/02/16 0142    Benjiman CoreNathan Pickering, MD 05/03/16 [phone]

## 2016-05-01 NOTE — ED Triage Notes (Signed)
Pt stating that he needs food right now or he woont have a blood pressure.

## 2016-05-01 NOTE — ED Triage Notes (Signed)
Pt BIB [facility]. Pt found [facility]. Could not get a ride home from his dialysis appointment. Pt reports weakness from walking around today. Hx CHF. A&Ox4.

## 2016-05-02 ENCOUNTER — Inpatient Hospital Stay (HOSPITAL_COMMUNITY)
Admission: EM | Admit: 2016-05-02 | Discharge: 2016-05-06 | DRG: 640 | Disposition: A | Discharge status: Skilled Nursing Facility | Payer: Medicare HMO | Attending: Family Medicine | Admitting: Family Medicine

## 2016-05-02 ENCOUNTER — Encounter (HOSPITAL_COMMUNITY): Payer: Self-pay

## 2016-05-02 DIAGNOSIS — Z9119 Patient's noncompliance with other medical treatment and regimen: Secondary | ICD-10-CM

## 2016-05-02 DIAGNOSIS — Z590 Homelessness: Secondary | ICD-10-CM

## 2016-05-02 DIAGNOSIS — E1122 Type 2 diabetes mellitus with diabetic chronic kidney disease: Secondary | ICD-10-CM | POA: Diagnosis present

## 2016-05-02 DIAGNOSIS — E8770 Fluid overload, unspecified: Principal | ICD-10-CM | POA: Diagnosis present

## 2016-05-02 DIAGNOSIS — I132 Hypertensive heart and chronic kidney disease with heart failure and with stage 5 chronic kidney disease, or end stage renal disease: Secondary | ICD-10-CM | POA: Diagnosis present

## 2016-05-02 DIAGNOSIS — Z992 Dependence on renal dialysis: Secondary | ICD-10-CM

## 2016-05-02 DIAGNOSIS — D649 Anemia, unspecified: Secondary | ICD-10-CM | POA: Diagnosis present

## 2016-05-02 DIAGNOSIS — I482 Chronic atrial fibrillation: Secondary | ICD-10-CM | POA: Diagnosis present

## 2016-05-02 DIAGNOSIS — I5022 Chronic systolic (congestive) heart failure: Secondary | ICD-10-CM | POA: Diagnosis present

## 2016-05-02 DIAGNOSIS — N186 End stage renal disease: Secondary | ICD-10-CM | POA: Diagnosis present

## 2016-05-02 DIAGNOSIS — Z23 Encounter for immunization: Secondary | ICD-10-CM

## 2016-05-02 DIAGNOSIS — Z7982 Long term (current) use of aspirin: Secondary | ICD-10-CM

## 2016-05-02 DIAGNOSIS — N2581 Secondary hyperparathyroidism of renal origin: Secondary | ICD-10-CM | POA: Diagnosis present

## 2016-05-02 DIAGNOSIS — Z9115 Patient's noncompliance with renal dialysis: Secondary | ICD-10-CM

## 2016-05-02 DIAGNOSIS — E785 Hyperlipidemia, unspecified: Secondary | ICD-10-CM | POA: Diagnosis present

## 2016-05-02 DIAGNOSIS — G9340 Encephalopathy, unspecified: Secondary | ICD-10-CM | POA: Diagnosis present

## 2016-05-02 DIAGNOSIS — Z794 Long term (current) use of insulin: Secondary | ICD-10-CM

## 2016-05-02 DIAGNOSIS — R531 Weakness: Secondary | ICD-10-CM | POA: Diagnosis not present

## 2016-05-02 DIAGNOSIS — F1410 Cocaine abuse, uncomplicated: Secondary | ICD-10-CM | POA: Diagnosis present

## 2016-05-02 DIAGNOSIS — Z79899 Other long term (current) drug therapy: Secondary | ICD-10-CM

## 2016-05-02 DIAGNOSIS — E1121 Type 2 diabetes mellitus with diabetic nephropathy: Secondary | ICD-10-CM | POA: Diagnosis present

## 2016-05-02 LAB — CBC WITH DIFFERENTIAL/PLATELET
BASOS PCT: 1 %
Basophils Absolute: 0 10*3/uL (ref 0.0–0.1)
EOS ABS: 0.2 10*3/uL (ref 0.0–0.7)
EOS PCT: 3 %
HCT: 35.7 % — ABNORMAL LOW (ref 39.0–52.0)
Hemoglobin: 11.8 g/dL — ABNORMAL LOW (ref 13.0–17.0)
LYMPHS ABS: 0.8 10*3/uL (ref 0.7–4.0)
Lymphocytes Relative: 14 %
MCH: 29.9 pg (ref 26.0–34.0)
MCHC: 33.1 g/dL (ref 30.0–36.0)
MCV: 90.4 fL (ref 78.0–100.0)
Monocytes Absolute: 0.9 10*3/uL (ref 0.1–1.0)
Monocytes Relative: 15 %
Neutro Abs: 4.2 10*3/uL (ref 1.7–7.7)
Neutrophils Relative %: 68 %
PLATELETS: 92 10*3/uL — AB (ref 150–400)
RBC: 3.95 MIL/uL — AB (ref 4.22–5.81)
RDW: 16.4 % — ABNORMAL HIGH (ref 11.5–15.5)
WBC: 6.1 10*3/uL (ref 4.0–10.5)

## 2016-05-02 LAB — COMPREHENSIVE METABOLIC PANEL
ALT: 22 U/L (ref 17–63)
AST: 36 U/L (ref 15–41)
Albumin: 2.8 g/dL — ABNORMAL LOW (ref 3.5–5.0)
Alkaline Phosphatase: 72 U/L (ref 38–126)
Anion gap: 20 — ABNORMAL HIGH (ref 5–15)
BUN: 73 mg/dL — ABNORMAL HIGH (ref 6–20)
CHLORIDE: 93 mmol/L — AB (ref 101–111)
CO2: 23 mmol/L (ref 22–32)
Calcium: 8.9 mg/dL (ref 8.9–10.3)
Creatinine, Ser: 10.29 mg/dL — ABNORMAL HIGH (ref 0.61–1.24)
GFR, EST AFRICAN AMERICAN: 5 mL/min — AB (ref >60)
GFR, EST NON AFRICAN AMERICAN: 5 mL/min — AB (ref >60)
Glucose, Bld: 184 mg/dL — ABNORMAL HIGH (ref 65–99)
POTASSIUM: 3.9 mmol/L (ref 3.5–5.1)
Sodium: 136 mmol/L (ref 135–145)
Total Bilirubin: 3 mg/dL — ABNORMAL HIGH (ref 0.3–1.2)
Total Protein: 7.9 g/dL (ref 6.5–8.1)

## 2016-05-02 LAB — AMMONIA: AMMONIA: 51 umol/L — AB (ref 9–35)

## 2016-05-02 MED ORDER — OXYMETAZOLINE HCL 0.05 % NA SOLN
2.0000 | Freq: Once | NASAL | Status: AC
  Administered 2016-05-02: 2 via NASAL
  Filled 2016-05-02: qty 15

## 2016-05-02 NOTE — ED Triage Notes (Signed)
Pt. Coming from dialysis center for weakness and possible confusion. Pt. Released from [place] today and sent to dialysis. Pt. Aox4 and c/o being hungry. EDP at bedside.

## 2016-05-02 NOTE — ED Notes (Signed)
At approx. 1600- Dialysis Center called. Velna Hatchet informed pt was received too late for dialysis. Social work arranged pt's appointment/transportation today.  Velna Hatchet informed of ED process to medically clear and discharge pt.  EDP Wentz informed of pt's current status at Dialysis Center. EDP Effie Shy stated if pt had stable vitals and appeared stable he could wait until Monday for his next dialysis. If not, pt should go to [facility] for dialysis treatment. Velna Hatchet stated she would call the PA to see next plan of care for this pt.

## 2016-05-02 NOTE — Progress Notes (Signed)
CSW met with this 65 y/o patient who states he has dialysis Wed, Fri, Sat.  He was not a reliable historian.  CSW gave patient bus pass and homeless resources.  CSW contacted the nephew with his permission for collateral.  The patient's nephew reports that he is working on an assisted living facility for his uncle the patient.  The patient was in a motel, but he went to the bank to get money to pay the motel to continue his stay.  Apparently patient reports his money went missing, without explanation, and he was evicted from [employer] and now is homeless.  Nephew confirmed that the patient did get his dialysis yesterday, but needs it today.  He also mentioned that the clinic has determined due to swelling he needs to have in every day next week.  Nephew is currently out of town.  CSW contacted [facility] on [address] and they stated the patient needs to be seen for dialysis today.  They asked if patient could be sent to their clinic.  CSW talked to nurse and EDP.  When discharged he will be sent via taxi to the Dialysis Clinic.  [place] Disposition CSW [phone]

## 2016-05-02 NOTE — ED Notes (Signed)
Patient requesting something to eat.  Stated he did not have anything to eat today.  Explained to him that I know he had a sandwich less than an hour ago

## 2016-05-02 NOTE — ED Provider Notes (Signed)
[facility]-EMERGENCY DEPT Provider Note   CSN: [id number] Arrival date & time: 05/02/16  1727     History   Chief Complaint Chief Complaint  Patient presents with  . Weakness    HPI [name] is a 65 y.o. male.   Weakness  Primary symptoms include no focal weakness, no loss of sensation, no loss of balance, no movement disorder, no dizziness. This is a recurrent problem. The current episode started more than 2 days ago. The problem has been gradually worsening. There was no focality noted. There has been no fever. Associated symptoms include altered mental status (per dialysis center). Pertinent negatives include no shortness of breath, no chest pain, no vomiting and no headaches.    Past Medical History:  Diagnosis Date  . Anemia   . Apical mural thrombus (HCC)   . Arthritis   . Atrial fibrillation, chronic (HCC)   . Chronic systolic heart failure (HCC)    a) ECHO [facility] 02/2014): EF <15%, multiple apical thrombi, RV mod enlarged, mod MR b. RHC (02/21/14) at [facility]: RA 16, RV 64/9 (15), PA 64/32 (42), PCWP: 24, CI: 1.4  . CKD (chronic kidney disease), stage III   . Diabetes mellitus without complication (HCC)   . Drug use    cocaine  . HLD (hyperlipidemia)   . HTN (hypertension)   . Intrahepatic cholestasis 02/20/2016  . Mitral regurgitation   . Pneumonia     Patient Active Problem List   Diagnosis Date Noted  . Intrahepatic cholestasis 02/20/2016  . Direct hyperbilirubinemia   . Paroxysmal atrial fibrillation (HCC)   . Shortness of breath   . Type 2 diabetes mellitus with diabetic nephropathy, without long-term current use of insulin (HCC)   . End stage renal disease (HCC) 12/25/2014  . Atrial fibrillation [I48.91] 08/09/2014  . Encounter for therapeutic drug monitoring 08/09/2014  . Chronic systolic heart failure (HCC) 07/04/2014  . ESRD (end stage renal disease) (HCC) 07/04/2014  . PAF (paroxysmal atrial fibrillation) (HCC) 07/04/2014  . Controlled diabetes  mellitus type II without complication (HCC) 06/22/2014  . Gout 06/22/2014  . ESRD needing dialysis (HCC)   . Pulmonary edema   . Bacteremia associated with intravascular line (HCC)   . Blood poisoning (HCC)   . Acute respiratory failure with hypoxia (HCC)   . AKI (acute kidney injury) (HCC)   . Screen for STD (sexually transmitted disease)   . Acute respiratory failure (HCC) 06/08/2014  . Cardiogenic shock (HCC) 06/08/2014  . Acute on chronic systolic congestive heart failure (HCC) 06/08/2014  . Acute on chronic renal failure (HCC) 06/08/2014  . Hyperkalemia 06/08/2014  . Respiratory failure (HCC) 06/08/2014    Past Surgical History:  Procedure Laterality Date  . AV FISTULA PLACEMENT Right 06/27/2014   Procedure: ARTERIOVENOUS (AV) FISTULA CREATION VERSUS GRAFT INSERTION;  Surgeon: Pryor Ochoa, MD;  Location: [facility] OR;  Service: Vascular;  Laterality: Right;  . COLONOSCOPY    . FISTULA SUPERFICIALIZATION Right 10/25/2014   Procedure: RIGHT ARM RADIOCEPHALIC FISTULA SUPERFICIALIZATION;  Surgeon: Pryor Ochoa, MD;  Location: [facility] OR;  Service: Vascular;  Laterality: Right;  . INSERTION OF DIALYSIS CATHETER  12/15       Home Medications    Prior to Admission medications   Medication Sig Start Date End Date Taking? Authorizing Provider  acetaminophen (TYLENOL) 500 MG tablet Take 500 mg by mouth every 6 (six) hours as needed for moderate pain.    Historical Provider, MD  amiodarone (PACERONE) 200 MG tablet Take 1 tablet (  200 mg total) by mouth daily. 08/23/14   Amy D Filbert Schilderlegg, NP  aspirin 325 MG tablet Take 325 mg by mouth daily.    Historical Provider, MD  atorvastatin (LIPITOR) 20 MG tablet Take 20 mg by mouth at bedtime.    Historical Provider, MD  insulin NPH Human (HUMULIN N,NOVOLIN N) 100 UNIT/ML injection Inject 0.12 mLs (12 Units total) into the skin at bedtime. Patient not taking: Reported on 05/02/2016 09/13/14   Margarita Grizzleanielle Ray, MD  insulin regular (NOVOLIN R,HUMULIN R) 100  units/mL injection Inject 0.06 mLs (6 Units total) into the skin 3 (three) times daily before meals. Patient not taking: Reported on 05/02/2016 09/13/14   Margarita Grizzleanielle Ray, MD  oxyCODONE-acetaminophen (ROXICET) 5-325 MG per tablet Take 1 tablet by mouth every 6 (six) hours as needed for severe pain. Patient not taking: Reported on 05/02/2016 10/25/14   Raymond GurneyKimberly A Trinh, PA-C  polyethylene glycol (MIRALAX / GLYCOLAX) packet Take 17 g by mouth daily. Patient not taking: Reported on 05/02/2016 02/22/16   Almon Herculesaye T Gonfa, MD  sevelamer carbonate (RENVELA) 800 MG tablet Take 3 tablets (2,400 mg total) by mouth 3 (three) times daily with meals. Patient taking differently: Take 800-1,600 mg by mouth See admin instructions. Take 1600 mg by mouth 3 times daily with meals and take 800 mg by mouth with snacks. 02/22/16   Almon Herculesaye T Gonfa, MD    Family History History reviewed. No pertinent family history.  Social History Social History  Substance Use Topics  . Smoking status: Never Smoker  . Smokeless tobacco: Never Used  . Alcohol use No     Comment: last usuage 1 yr. ago     Allergies   Review of patient's allergies indicates no known allergies.   Review of Systems Review of Systems  Constitutional: Negative for chills and fever.  HENT: Negative for ear pain and sore throat.   Eyes: Negative for pain and visual disturbance.  Respiratory: Negative for cough and shortness of breath.   Cardiovascular: Negative for chest pain and palpitations.  Gastrointestinal: Negative for abdominal pain and vomiting.  Genitourinary: Negative for dysuria and hematuria.  Musculoskeletal: Negative for arthralgias and back pain.  Skin: Negative for color change and rash.  Neurological: Positive for weakness. Negative for dizziness, focal weakness, seizures, syncope, headaches and loss of balance.  All other systems reviewed and are negative.    Physical Exam Updated Vital Signs BP 108/80 (BP Location: Left Arm)   Pulse  100   Temp 97.9 F (36.6 C) (Oral)   Resp 17   Ht 6' (1.829 m)   Wt 116.1 kg   SpO2 97%   BMI 34.72 kg/m   Physical Exam  Constitutional: He is oriented to person, place, and time. He appears well-developed and well-nourished.  HENT:  Head: Normocephalic and atraumatic.  Eyes: Conjunctivae are normal.  Neck: Neck supple.  Cardiovascular: Normal rate, regular rhythm and intact distal pulses.   Pulmonary/Chest: Effort normal and breath sounds normal. No respiratory distress.  Abdominal: Soft. There is no tenderness.  Musculoskeletal: He exhibits no edema or tenderness.  Neurological: He is alert and oriented to person, place, and time.  Skin: Skin is warm and dry.  Psychiatric: He has a normal mood and affect.  Nursing note and vitals reviewed.    ED Treatments / Results  Labs (all labs ordered are listed, but only abnormal results are displayed) Labs Reviewed  CBC WITH DIFFERENTIAL/PLATELET  AMMONIA  COMPREHENSIVE METABOLIC PANEL    EKG  EKG  Interpretation  Date/Time:  Saturday May 02 2016 17:41:14 EDT Ventricular Rate:  99 PR Interval:    QRS Duration: 132 QT Interval:  375 QTC Calculation: 482 R Axis:   -33 Text Interpretation:  Sinus tachycardia Atrial premature complexes Nonspecific intraventricular conduction delay Consider anterior infarct Nonspecific T abnormalities, lateral leads similar previous Confirmed by ZAVITZ MD, JOSHUA [phone]) on 05/02/2016 6:00:27 PM       Radiology No results found.  Procedures Procedures (including critical care time)  Medications Ordered in ED Medications - No data to display   Initial Impression / Assessment and Plan / ED Course  I have reviewed the triage vital signs and the nursing notes.  Pertinent labs & imaging results that were available during my care of the patient were reviewed by me and considered in my medical decision making (see chart for details).  Clinical Course    At [facility] yesterday  for weakness that resolved with food. No emergent need for dialysis there.  They held in the ED overnight as he is homeless.  Discharged today with instructions to get dialysis; he went to the center too late and was told he could not get it today.  At the center, they felt that the patient was not at his baseline mentally.  Called EMS and brought to [facility].  Patient is repeating himself and sleepy but arousable.  EKG without peaked t-waves.  Labs ordered demonstrate elevated ammonia and BUN. Normal K+.  Consulted nephrology who will set up dialysis for the morning.  Patient admitted to hospitalist while awaiting dialysis.  Final Clinical Impressions(s) / ED Diagnoses   Final diagnoses:  Generalized weakness    New Prescriptions New Prescriptions   No medications on file     Garey Ham, MD 05/02/16 2348    Blane Ohara, MD 05/03/16 [phone]

## 2016-05-02 NOTE — ED Notes (Signed)
Delay in discharge- low BP. Orders to ambulate pt and re vital pt.

## 2016-05-02 NOTE — ED Notes (Signed)
NO BLEEDING AT PRESENT

## 2016-05-02 NOTE — ED Notes (Signed)
MD at bedside.EDP WENTZ PRESENT TO EVALUATE PT

## 2016-05-02 NOTE — ED Notes (Signed)
Pt. Given food with EDP approval   

## 2016-05-02 NOTE — ED Notes (Signed)
Pt. Attempted to walk to restroom, but sts he is too sore and asked to be wheeled in wheel chair. Pt. Wheeled to restroom at this time.

## 2016-05-02 NOTE — ED Provider Notes (Signed)
Patient presents for fatigue, and was evaluated for complications. He also is apparently homeless, having lost his hotel room. He was watched overnight in the emergency department, and developed a nosebleed during that time, which has been successfully treated with Afrin nasal spray. The patient was apparently to be dialyzed today, but has not left for that yet.   13:25- . Social work has arranged for him to be dialyzed today, and daily next week for lower extremity edema. They will send him to his unit, now by cab.   Mancel Bale, MD 05/02/16 1327

## 2016-05-02 NOTE — ED Notes (Signed)
SOCIAL WORK PRESENT. WILL ARRANGE TRANSPORTATION TO DIALYSIS BEFORE DISCHARGE.

## 2016-05-03 DIAGNOSIS — N19 Unspecified kidney failure: Secondary | ICD-10-CM

## 2016-05-03 DIAGNOSIS — N186 End stage renal disease: Secondary | ICD-10-CM

## 2016-05-03 DIAGNOSIS — I480 Paroxysmal atrial fibrillation: Secondary | ICD-10-CM | POA: Diagnosis not present

## 2016-05-03 DIAGNOSIS — R531 Weakness: Secondary | ICD-10-CM

## 2016-05-03 DIAGNOSIS — E1121 Type 2 diabetes mellitus with diabetic nephropathy: Secondary | ICD-10-CM | POA: Diagnosis not present

## 2016-05-03 DIAGNOSIS — Z992 Dependence on renal dialysis: Secondary | ICD-10-CM

## 2016-05-03 LAB — GLUCOSE, CAPILLARY
GLUCOSE-CAPILLARY: 190 mg/dL — AB (ref 65–99)
GLUCOSE-CAPILLARY: 190 mg/dL — AB (ref 65–99)
GLUCOSE-CAPILLARY: 219 mg/dL — AB (ref 65–99)
Glucose-Capillary: 145 mg/dL — ABNORMAL HIGH (ref 65–99)
Glucose-Capillary: 184 mg/dL — ABNORMAL HIGH (ref 65–99)
Glucose-Capillary: 213 mg/dL — ABNORMAL HIGH (ref 65–99)

## 2016-05-03 LAB — MRSA PCR SCREENING: MRSA BY PCR: NEGATIVE

## 2016-05-03 MED ORDER — AMIODARONE HCL 200 MG PO TABS
200.0000 mg | ORAL_TABLET | Freq: Every day | ORAL | Status: DC
  Administered 2016-05-03 – 2016-05-06 (×4): 200 mg via ORAL
  Filled 2016-05-03 (×5): qty 1

## 2016-05-03 MED ORDER — WHITE PETROLATUM GEL
Status: AC
  Administered 2016-05-03: 09:00:00
  Filled 2016-05-03: qty 1

## 2016-05-03 MED ORDER — HEPARIN SODIUM (PORCINE) 5000 UNIT/ML IJ SOLN
5000.0000 [IU] | Freq: Three times a day (TID) | INTRAMUSCULAR | Status: DC
  Administered 2016-05-03 – 2016-05-06 (×11): 5000 [IU] via SUBCUTANEOUS
  Filled 2016-05-03 (×10): qty 1

## 2016-05-03 MED ORDER — PNEUMOCOCCAL VAC POLYVALENT 25 MCG/0.5ML IJ INJ
0.5000 mL | INJECTION | INTRAMUSCULAR | Status: AC
  Administered 2016-05-04: 0.5 mL via INTRAMUSCULAR
  Filled 2016-05-03: qty 0.5

## 2016-05-03 MED ORDER — WHITE PETROLATUM GEL
Status: AC
  Administered 2016-05-03: 22:00:00
  Filled 2016-05-03: qty 1

## 2016-05-03 MED ORDER — SEVELAMER CARBONATE 800 MG PO TABS: 800.0000 mg | ORAL_TABLET | ORAL | Status: DC

## 2016-05-03 MED ORDER — SEVELAMER CARBONATE 800 MG PO TABS: 800.0000 mg | ORAL_TABLET | ORAL | Status: DC | PRN

## 2016-05-03 MED ORDER — ACETAMINOPHEN 650 MG RE SUPP: 650.0000 mg | Freq: Four times a day (QID) | RECTAL | Status: DC | PRN

## 2016-05-03 MED ORDER — DOXERCALCIFEROL 4 MCG/2ML IV SOLN
2.0000 ug | INTRAVENOUS | Status: DC
  Administered 2016-05-05: 2 ug via INTRAVENOUS
  Filled 2016-05-03: qty 2

## 2016-05-03 MED ORDER — ONDANSETRON HCL 4 MG PO TABS: 4.0000 mg | ORAL_TABLET | Freq: Four times a day (QID) | ORAL | Status: DC | PRN

## 2016-05-03 MED ORDER — SEVELAMER CARBONATE 800 MG PO TABS
1600.0000 mg | ORAL_TABLET | Freq: Three times a day (TID) | ORAL | Status: DC
  Administered 2016-05-03 – 2016-05-06 (×11): 1600 mg via ORAL
  Filled 2016-05-03 (×12): qty 2

## 2016-05-03 MED ORDER — INSULIN ASPART 100 UNIT/ML ~~LOC~~ SOLN
0.0000 [IU] | Freq: Three times a day (TID) | SUBCUTANEOUS | Status: DC
  Administered 2016-05-03: 2 [IU] via SUBCUTANEOUS
  Administered 2016-05-03: 1 [IU] via SUBCUTANEOUS
  Administered 2016-05-03 – 2016-05-04 (×2): 2 [IU] via SUBCUTANEOUS
  Administered 2016-05-04: 1 [IU] via SUBCUTANEOUS
  Administered 2016-05-05: 2 [IU] via SUBCUTANEOUS
  Administered 2016-05-05: 5 [IU] via SUBCUTANEOUS
  Administered 2016-05-06 (×2): 2 [IU] via SUBCUTANEOUS
  Administered 2016-05-06: 1 [IU] via SUBCUTANEOUS

## 2016-05-03 MED ORDER — ACETAMINOPHEN 325 MG PO TABS: 650.0000 mg | ORAL_TABLET | Freq: Four times a day (QID) | ORAL | Status: DC | PRN

## 2016-05-03 MED ORDER — HYDROXYZINE HCL 25 MG PO TABS
25.0000 mg | ORAL_TABLET | Freq: Three times a day (TID) | ORAL | Status: DC | PRN
  Administered 2016-05-03 – 2016-05-06 (×4): 25 mg via ORAL
  Filled 2016-05-03 (×4): qty 1

## 2016-05-03 MED ORDER — INSULIN ASPART 100 UNIT/ML ~~LOC~~ SOLN
0.0000 [IU] | Freq: Every day | SUBCUTANEOUS | Status: DC
  Administered 2016-05-03: 2 [IU] via SUBCUTANEOUS

## 2016-05-03 MED ORDER — ATORVASTATIN CALCIUM 20 MG PO TABS
20.0000 mg | ORAL_TABLET | Freq: Every day | ORAL | Status: DC
  Administered 2016-05-03 – 2016-05-05 (×4): 20 mg via ORAL
  Filled 2016-05-03 (×4): qty 1

## 2016-05-03 MED ORDER — DOXERCALCIFEROL 4 MCG/2ML IV SOLN
2.0000 ug | Freq: Once | INTRAVENOUS | Status: AC
  Administered 2016-05-04: 2 ug via INTRAVENOUS
  Filled 2016-05-03: qty 2

## 2016-05-03 MED ORDER — ASPIRIN EC 81 MG PO TBEC
81.0000 mg | DELAYED_RELEASE_TABLET | Freq: Every day | ORAL | Status: DC
  Administered 2016-05-03 – 2016-05-06 (×4): 81 mg via ORAL
  Filled 2016-05-03 (×5): qty 1

## 2016-05-03 MED ORDER — ONDANSETRON HCL 4 MG/2ML IJ SOLN: 4.0000 mg | Freq: Four times a day (QID) | INTRAMUSCULAR | Status: DC | PRN

## 2016-05-03 MED ORDER — RENA-VITE PO TABS
1.0000 | ORAL_TABLET | Freq: Every day | ORAL | Status: DC
  Administered 2016-05-03 – 2016-05-05 (×3): 1 via ORAL
  Filled 2016-05-03 (×3): qty 1

## 2016-05-03 NOTE — Progress Notes (Signed)
Patient seen and examined this morning, admitted overnight by Dr. Montez Morita.   In brief, 65 y.o. gentleman with a history of ESRD on HD T/Th/Sat, paroxysmal atrial fibrillation (CHADS-Vasc score of at least 3, presumably not anticoagulated due to history of polysubstance abuse, maintaining NSR on amiodarone, followed by Dr. Teressa Lower), diabetes, and chronic systolic heart failure who presents to the ED complaining of weakness and encephalopathy  ESRD on HD T/Th/Sat with history of noncompliance.   - I suspect the patient has mild uremia at this point due to missed HD sessions.  Anticipate HD in the AM. - HD Monday morning - consulted SW for homelessness.  - PT to eval  DM - SSI coverage.  Hold long acting insulins for now.  History of A fib - Continue amiodarone, baby aspirin for now  Costin M. Elvera Lennox, MD [facility] [phone]

## 2016-05-03 NOTE — Consult Note (Signed)
Renal Service Consult Note [facility]  [name] 05/03/2016 [name] D Requesting Physician:  Dr. Elvera Lennox  Reason for Consult:  ESRD pt w missed HD and poor social situation HPI: The patient is a 65 y.o. year-old AAM who ran out of money to pay for hotel. Homeless. Has been told in the past his check is too much for Medicaid so can't afford ALFs.  Last used cocaine within the last week, "but he didn't buy it."  Can't walk long distances due to fatigue, DOE and fatigue. Admitted last night due to missed HD, we are asked to see for ESRD.  Pt grew up in [place], [place], went to HS there and got full ride football scholarship to [employer] in [place].  Played fullback there. 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  Says he retired around year 2000 at age 15.  Then got kidney failure from DM/ HTN and started HD around 1 yr ago in [place].  Had been living with his mother in [place] in her house.  She was put in a SNF about 3 mos ago, then more recently a few weeks ago he found out that she sold her house, the house that they/ he had been living in.  Now he says he is homeless, first time homeless for him.  He missed Sat HD was in ED at [facility], was dc'd and showed up to [facility] but was too late for HD.  Has marked LE edema so came to ED and was admitted overnight.    Denies any CP, SOB, cough, orthopnea.  Marked LE edema bilat     ROS  denies CP  no joint pain   no HA  no blurry vision  no rash  no diarrhea  no nausea/ vomiting  no dysuria  no difficulty voiding  no change in urine color    Past Medical History  Past Medical History:  Diagnosis Date  . Anemia   . Apical mural thrombus (HCC)   . Arthritis   . Atrial fibrillation, chronic (HCC)   . Chronic systolic heart failure (HCC)    a) ECHO [facility] 02/2014): EF <15%, multiple apical thrombi, RV mod enlarged, mod MR b. RHC (02/21/14) at [facility]: RA 16, RV  64/9 (15), PA 64/32 (42), PCWP: 24, CI: 1.4  . CKD (chronic kidney disease), stage III   . Diabetes mellitus without complication (HCC)   . Drug use    cocaine  . HLD (hyperlipidemia)   . HTN (hypertension)   . Intrahepatic cholestasis 02/20/2016  . Mitral regurgitation   . Pneumonia    Past Surgical History  Past Surgical History:  Procedure Laterality Date  . AV FISTULA PLACEMENT Right 06/27/2014   Procedure: ARTERIOVENOUS (AV) FISTULA CREATION VERSUS GRAFT INSERTION;  Surgeon: Pryor Ochoa, MD;  Location: [facility] OR;  Service: Vascular;  Laterality: Right;  . COLONOSCOPY    . FISTULA SUPERFICIALIZATION Right 10/25/2014   Procedure: RIGHT ARM RADIOCEPHALIC FISTULA SUPERFICIALIZATION;  Surgeon: Pryor Ochoa, MD;  Location: [facility] OR;  Service: Vascular;  Laterality: Right;  . INSERTION OF DIALYSIS CATHETER  12/15   Family History History reviewed. No pertinent family history. Social History  reports that he has never smoked. He has never used smokeless tobacco. He reports that he uses drugs, including Cocaine. He reports that he does not drink alcohol. Allergies No Known Allergies Home medications Prior to Admission medications   Medication Sig Start Date End Date  Taking? Authorizing Provider  acetaminophen (TYLENOL) 500 MG tablet Take 500 mg by mouth every 6 (six) hours as needed for moderate pain.    Historical Provider, MD  amiodarone (PACERONE) 200 MG tablet Take 1 tablet (200 mg total) by mouth daily. 08/23/14   Amy D Filbert Schilder, NP  aspirin 325 MG tablet Take 325 mg by mouth daily.    Historical Provider, MD  atorvastatin (LIPITOR) 20 MG tablet Take 20 mg by mouth at bedtime.    Historical Provider, MD  insulin NPH Human (HUMULIN N,NOVOLIN N) 100 UNIT/ML injection Inject 0.12 mLs (12 Units total) into the skin at bedtime. Patient not taking: Reported on 05/02/2016 09/13/14   Margarita Grizzle, MD  insulin regular (NOVOLIN R,HUMULIN R) 100 units/mL injection Inject 0.06 mLs (6 Units total) into the  skin 3 (three) times daily before meals. Patient not taking: Reported on 05/02/2016 09/13/14   Margarita Grizzle, MD  oxyCODONE-acetaminophen (ROXICET) 5-325 MG per tablet Take 1 tablet by mouth every 6 (six) hours as needed for severe pain. Patient not taking: Reported on 05/02/2016 10/25/14   Raymond Gurney, PA-C  polyethylene glycol (MIRALAX / GLYCOLAX) packet Take 17 g by mouth daily. Patient not taking: Reported on 05/02/2016 02/22/16   Almon Hercules, MD  sevelamer carbonate (RENVELA) 800 MG tablet Take 3 tablets (2,400 mg total) by mouth 3 (three) times daily with meals. Patient taking differently: Take 800-1,600 mg by mouth See admin instructions. Take 1600 mg by mouth 3 times daily with meals and take 800 mg by mouth with snacks. 02/22/16   Almon Hercules, MD   Liver Function Tests  Recent Labs Lab 05/02/16 1832  AST 36  ALT 22  ALKPHOS 72  BILITOT 3.0*  PROT 7.9  ALBUMIN 2.8*   No results for input(s): LIPASE, AMYLASE in the last 168 hours. CBC  Recent Labs Lab 04/30/16 1324 05/01/16 2316 05/02/16 1832  WBC 5.7 5.9 6.1  NEUTROABS 3.7 4.2 4.2  HGB 12.8* 12.1* 11.8*  HCT 38.2* 34.6* 35.7*  MCV 91.0 87.8 90.4  PLT 93* 94* 92*   Basic Metabolic Panel  Recent Labs Lab 04/30/16 1324 05/01/16 2316 05/02/16 1832  NA 136 136 136  K 3.8 3.8 3.9  CL 93* 94* 93*  CO2 24 27 23   GLUCOSE 156* 192* 184*  BUN 68* 55* 73*  CREATININE 12.12* 9.65* 10.29*  CALCIUM 9.2 8.7* 8.9   Iron/TIBC/Ferritin/ %Sat    Component Value Date/Time   IRON 110 02/20/2016 1015   TIBC 298 02/20/2016 1015   FERRITIN 787 (H) 02/20/2016 1015   IRONPCTSAT 37 02/20/2016 1015    Vitals:   05/02/16 1815 05/03/16 0041 05/03/16 0553 05/03/16 0949  BP: 110/86 118/72 (!) 93/55 104/78  Pulse: 100 (!) 103 100 95  Resp: 12 18 16 17   Temp:  98.7 F (37.1 C) 98.5 F (36.9 C) 98.2 F (36.8 C)  TempSrc:  Oral Oral Oral  SpO2: 98% 99% 92% 95%  Weight:  116.6 kg (257 lb)    Height:  6' (1.829 m)      Exam Gen: disheveled AA M NAD supine in bed Heart: RRR Lungs: grossly clear Extr: 2-3+ LE edema Abd: soft NT Access: right AVF + bruit Neuro: initially poorly communicative and drowsy but eventually sat up and had a conversation  Labs: 9/23 K 3.9  BUN 73 Cr 10.29 CO2 23 Alb 2.8 NH3 51 hgb 11.8 WBC ok with normal diff plts 92  HD orders:  TTS [facility] -  4hr EDW 105 2 K 2.25 Ca 450/800 right upper AVF hectorol 2 heparin 6000 no Mircera or Fe Recent labs: hgb 1.26 9/22 22%sat in August P 7.6 iPTH 474 Ca ok LAST  HD was 9/22 for 4 hours - it was his only dialysis last week - prior to that he dialyzed 9/16 for almost 3 hr  A/P: 1. ESRD - can wait until Monday for HD - HD inpatient unit staff available on Sundays only for emergent HD. Plan HD Monday  2. BP /Volume- most excess volume is peripheral- the closest he has gotten to edw was 107.8 on 9/16 -needs serial HD to get volume down; not on BP meds 3, Anemia - controlled 4. SHPT- hectorol/binders 5. Hx afib - on amio when he has meds. 6. Cocaine - ongoing issue - he insists he is going to stop as he loses everything when using 7. Homelessness - no transportation; complicating health care management- has told me he has been told in the past that no one can really help him. SW consult in am 8. Disp - Plan HD first round Monday and hopefully SW can come up with some options for d/c  then can continue with outpatient HD on Tuesday.  Bard HerbertMarty Bergman, PA-C [facility] [phone]  [name] Moselleob [name] Brandenburger MD [facility] pager [phone]    cell [phone] 05/03/2016, 1:03 PM

## 2016-05-03 NOTE — Progress Notes (Signed)
New Admission Note:  Arrival Method: Bed from ED around 0030 Mental Orientation: A/o x4 Telemetry: None Assessment: Completed Skin: Scratches and dry, intact. Zenab M. RN was second nurse Iv: Left hand saline locked Pain: Denies-0 Tubes: None Safety Measures: Safety Fall Prevention Plan was given, discussed and signed. Admission: Completed [address] [place] Orientation: Patient has been orientated to the room, unit and the staff. Family: None present  Orders have been reviewed and implemented. Will continue to monitor the patient. Call light has been placed within reach.  Alfonse Ras, RN  Phone Number: [phone]

## 2016-05-03 NOTE — Progress Notes (Signed)
S:  Ran out of money to pay for hotel. Homeless. Has been told in the past his check is too much for Medicaid so can't afford ALFs.  Last used cocaine within the last week, "but he didn't buy it."  Can't walk long distances due to fatigue, DOE and fatigue. He cannot tell me where he was living before.  Doesn't know the names of the streets in [place].    O:  Wt Readings from Last 3 Encounters:  05/03/16 116.6 kg (257 lb)  04/30/16 116.3 kg (256 lb 8 oz)  02/22/16 104.5 kg (230 lb 6.1 oz)   Temp Readings from Last 3 Encounters:  05/03/16 98.2 F (36.8 C) (Oral)  05/02/16 97.6 F (36.4 C) (Oral)  04/30/16 97.7 F (36.5 C) (Oral)   BP Readings from Last 3 Encounters:  05/03/16 104/78  05/02/16 108/70  04/30/16 103/77   Pulse Readings from Last 3 Encounters:  05/03/16 95  05/02/16 100  04/30/16 94   Gen: disheveled AA M NAD supine in bed Heart: RRR Lungs: grossly clear Extr: 2-3+ LE edema Abd: soft NT Access: right AVF + bruit Neuro: initially poorly communicative and drowsy but eventually sat up and had a conversation  Labs: 9/23 K 3.9  BUN 73 Cr 10.29 CO2 23 Alb 2.8 NH3 51 hgb 11.8 WBC ok with normal diff plts 92  HD orders:  TTS [facility] - 4hr EDW 105 2 K 2.25 Ca 450/800 right upper AVF hectorol 2 heparin 6000 no Mircera or Fe Recent labs: hgb 1.26 9/22 22%sat in August P 7.6 iPTH 474 Ca ok LAST  HD was 9/22 for 4 hours - it was his only dialysis last week - prior to that he dialyzed 9/16 for almost 3 hr  A/P: 1. ESRD - I think he can wait until Monday for HD - HD inpatient unit staff available on Sundays only for emergent HD 2. BP /Volume- most excess volume is peripheral- the closest he has gotten to edw was 107.8 on 9/16 -needs serial HD to get volume down; not on BP meds 3, Anemia - controlled 4. SHPT- hectorol/binders 5. Hx afib - on amio when he has meds. 6. Cocaine - ongoing issue - he insists he is going to stop as he loses everything when using 7. Homelessness - no  transportation; complicating health care management- has told me he has been told in the past that no one can really help him. 8. Disp - Plan HD first round Monday and hopefully SW can come up with some options for d/c  then can continue with outpatient HD on Tuesday.  Bard Herbert, PA-C [facility] [phone]  Pt seen, examined and agree w A/P as above.  Vinson Moselle MD [employer] pager [phone]   05/11/2016, 11:19 AM

## 2016-05-03 NOTE — H&P (Signed)
History and Physical    [name] SFS:[id number] DOB: 01-12-1951 DOA: 05/02/2016   Patient coming from: HD unit  Chief Complaint: Weakness, fatigue, missed HD  HPI: [name] is a 65 y.o. gentleman with a history of ESRD on HD T/Th/Sat, paroxysmal atrial fibrillation (CHADS-Vasc score of at least 3, presumably not anticoagulated due to history of polysubstance abuse, maintaining NSR on amiodarone, followed by Dr. Teressa Lower), diabetes, and chronic systolic heart failure who presents to the ED complaining of weakness.  The patient has a history of medical noncompliance and missed HD session.  Apparently, he was just in the ED at [facility] last night.  There were no indications for admission then, and the patient was supposed to discharge so that he could undergo routine HD at his outpatient unit today.  Apparently, the patient was unable to be dialyzed and there was some concern about his mental status, so he was referred back to the ED at [facility].  The patient knows that he is mildly disoriented, but he does not have any localizing symptoms of complaints.  He is hungry and asking for his third sandwich in the ED.    ED Course: Case discussed with nephrology, and patient cannot be dialyzed until the morning.  Hospitalist asked to admit.   Review of Systems: The patient still makes small amount of urine.  He denies LUTS.  Denies diarrhea.  Denies chest pain.  Say he has chronic intermittent shortness of breath that is unchanged.  He has chronic LE edema.  Otherwise, 10 systems reviewed and negative except as stated in the HPI.      Past Medical History:  Diagnosis Date  . Anemia   . Apical mural thrombus (HCC)   . Arthritis   . Atrial fibrillation, chronic (HCC)   . Chronic systolic heart failure (HCC)    a) ECHO [facility] 02/2014): EF <15%, multiple apical thrombi, RV mod enlarged, mod MR b. RHC (02/21/14) at [facility]: RA 16, RV 64/9 (15), PA 64/32 (42), PCWP: 24, CI: 1.4  . CKD (chronic  kidney disease), stage III   . Diabetes mellitus without complication (HCC)   . Drug use    cocaine  . HLD (hyperlipidemia)   . HTN (hypertension)   . Intrahepatic cholestasis 02/20/2016  . Mitral regurgitation   . Pneumonia     Past Surgical History:  Procedure Laterality Date  . AV FISTULA PLACEMENT Right 06/27/2014   Procedure: ARTERIOVENOUS (AV) FISTULA CREATION VERSUS GRAFT INSERTION;  Surgeon: Pryor Ochoa, MD;  Location: [facility] OR;  Service: Vascular;  Laterality: Right;  . COLONOSCOPY    . FISTULA SUPERFICIALIZATION Right 10/25/2014   Procedure: RIGHT ARM RADIOCEPHALIC FISTULA SUPERFICIALIZATION;  Surgeon: Pryor Ochoa, MD;  Location: [facility] OR;  Service: Vascular;  Laterality: Right;  . INSERTION OF DIALYSIS CATHETER  12/15     reports that he has never smoked. He has never used smokeless tobacco. He reports that he uses drugs, including Cocaine. He reports that he does not drink alcohol.  Patient denies active illegal drug use at this time.  No Known Allergies  History reviewed. No pertinent family history.   Prior to Admission medications   Medication Sig Start Date End Date Taking? Authorizing Provider  acetaminophen (TYLENOL) 500 MG tablet Take 500 mg by mouth every 6 (six) hours as needed for moderate pain.    Historical Provider, MD  amiodarone (PACERONE) 200 MG tablet Take 1 tablet (200 mg total) by mouth daily. 08/23/14   Amy D  Clegg, NP  aspirin 325 MG tablet Take 325 mg by mouth daily.    Historical Provider, MD  atorvastatin (LIPITOR) 20 MG tablet Take 20 mg by mouth at bedtime.    Historical Provider, MD  insulin NPH Human (HUMULIN N,NOVOLIN N) 100 UNIT/ML injection Inject 0.12 mLs (12 Units total) into the skin at bedtime. Patient not taking: Reported on 05/02/2016 09/13/14   Margarita Grizzleanielle Ray, MD  insulin regular (NOVOLIN R,HUMULIN R) 100 units/mL injection Inject 0.06 mLs (6 Units total) into the skin 3 (three) times daily before meals. Patient not taking: Reported on  05/02/2016 09/13/14   Margarita Grizzleanielle Ray, MD  oxyCODONE-acetaminophen (ROXICET) 5-325 MG per tablet Take 1 tablet by mouth every 6 (six) hours as needed for severe pain. Patient not taking: Reported on 05/02/2016 10/25/14   Raymond GurneyKimberly A Trinh, PA-C  polyethylene glycol (MIRALAX / GLYCOLAX) packet Take 17 g by mouth daily. Patient not taking: Reported on 05/02/2016 02/22/16   Almon Herculesaye T Gonfa, MD  sevelamer carbonate (RENVELA) 800 MG tablet Take 3 tablets (2,400 mg total) by mouth 3 (three) times daily with meals. Patient taking differently: Take 800-1,600 mg by mouth See admin instructions. Take 1600 mg by mouth 3 times daily with meals and take 800 mg by mouth with snacks. 02/22/16   Almon Herculesaye T Gonfa, MD    Physical Exam: Vitals:   05/02/16 1742 05/02/16 1743 05/02/16 1745 05/02/16 1815  BP: 108/80  104/84 110/86  Pulse: 100   100  Resp: 17  15 12   Temp: 97.9 F (36.6 C)     TempSrc: Oral     SpO2: 97%   98%  Weight:  116.1 kg (256 lb)    Height:  6' (1.829 m)        Constitutional: NAD, calm, comfortable, constantly scratching due to itch Vitals:   05/02/16 1742 05/02/16 1743 05/02/16 1745 05/02/16 1815  BP: 108/80  104/84 110/86  Pulse: 100   100  Resp: 17  15 12   Temp: 97.9 F (36.6 C)     TempSrc: Oral     SpO2: 97%   98%  Weight:  116.1 kg (256 lb)    Height:  6' (1.829 m)     Eyes: PERRL, lids and conjunctivae normal ENMT: Mucous membranes are dry. Posterior pharynx clear of any exudate or lesions. Normal dentition.  Neck: normal appearance, supple Respiratory: clear to auscultation bilaterally, no wheezing, no crackles. Normal respiratory effort. No accessory muscle use.  Cardiovascular: Normal rate, regular rhythm.  2+ edema bilaterally.  2+ pedal pulses.   GI: abdomen is soft and compressible.  No distention.  No tenderness.  Bowel sounds are present. Musculoskeletal:  No joint deformity in upper and lower extremities. Good ROM, no contractures. Normal muscle tone.  Palpable thrill over  dialysis shunt in right forearm.  Skin: Very dry Neurologic: No focal deficits. Psychiatric: Disoriented to month year but know who the POTUS is.  Affect appropriate.    Labs on Admission: I have personally reviewed following labs and imaging studies  CBC:  Recent Labs Lab 04/30/16 1324 05/01/16 2316 05/02/16 1832  WBC 5.7 5.9 6.1  NEUTROABS 3.7 4.2 4.2  HGB 12.8* 12.1* 11.8*  HCT 38.2* 34.6* 35.7*  MCV 91.0 87.8 90.4  PLT 93* 94* 92*   Basic Metabolic Panel:  Recent Labs Lab 04/30/16 1324 05/01/16 2316 05/02/16 1832  NA 136 136 136  K 3.8 3.8 3.9  CL 93* 94* 93*  CO2 24 27 23   GLUCOSE 156* 192*  184*  BUN 68* 55* 73*  CREATININE 12.12* 9.65* 10.29*  CALCIUM 9.2 8.7* 8.9   GFR: Estimated Creatinine Clearance: 9.2 mL/min (by C-G formula based on SCr of 10.29 mg/dL (H)). Liver Function Tests:  Recent Labs Lab 05/02/16 1832  AST 36  ALT 22  ALKPHOS 72  BILITOT 3.0*  PROT 7.9  ALBUMIN 2.8*    Recent Labs Lab 05/02/16 1832  AMMONIA 51*   CBG:  Recent Labs Lab 04/30/16 1233  GLUCAP 177*   Urine analysis:    Component Value Date/Time   COLORURINE RED (A) 09/13/2014 0952   APPEARANCEUR HAZY (A) 09/13/2014 0952   LABSPEC 1.025 09/13/2014 0952   PHURINE 5.0 09/13/2014 0952   GLUCOSEU 100 (A) 09/13/2014 0952   HGBUR NEGATIVE 09/13/2014 0952   BILIRUBINUR LARGE (A) 09/13/2014 0952   KETONESUR 15 (A) 09/13/2014 0952   PROTEINUR 30 (A) 09/13/2014 0952   UROBILINOGEN 2.0 (H) 09/13/2014 0952   NITRITE POSITIVE (A) 09/13/2014 0952   LEUKOCYTESUR SMALL (A) 09/13/2014 0952     EKG: Independently reviewed. NSR.  No acute ST elevation.  Assessment/Plan Principal Problem:   ESRD (end stage renal disease) (HCC) Active Problems:   Type 2 diabetes mellitus with diabetic nephropathy, without long-term current use of insulin (HCC)   Generalized weakness   ESRD (end stage renal disease) on dialysis (HCC)      ESRD on HD T/Th/Sat with history of  noncompliance.  I suspect the patient has mild uremia at this point due to missed HD sessions.  Anticipate HD in the AM. --Nephrology should give HD orders in the AM.  Potassium and bicarb are normal.  He has evidence of volume overload, but he is not hypoxic or short of breath. --He will need social services consult.  I do believe there is secondary gain going on here because he is homeless. --Continue home medications.  DM --SSI coverage.  Hold long acting insulins for now.  History of A fib --Continue amiodarone, baby aspirin for now   DVT prophylaxis: SubQ heparin Code Status: FULL CODE Family Communication: Patient alone in the ED at time of discharge. Disposition Plan: To be determined. Consults called: Nephrology Admission status: Observation, med surg.   TIME SPENT: 50 minutes   Jerene Bears MD [facility] Pager [phone]  If 7PM-7AM, please contact night-coverage [email or web address] Password [facility]  05/03/2016, 12:07 AM

## 2016-05-03 NOTE — ED Notes (Signed)
Attempted IV x 1, will consult to IV team. Spoke to [facility] on 6E, pt ok to come to floor with IV team consult

## 2016-05-04 DIAGNOSIS — D649 Anemia, unspecified: Secondary | ICD-10-CM | POA: Diagnosis present

## 2016-05-04 DIAGNOSIS — I482 Chronic atrial fibrillation: Secondary | ICD-10-CM | POA: Diagnosis present

## 2016-05-04 DIAGNOSIS — R531 Weakness: Secondary | ICD-10-CM | POA: Diagnosis present

## 2016-05-04 DIAGNOSIS — N2581 Secondary hyperparathyroidism of renal origin: Secondary | ICD-10-CM | POA: Diagnosis present

## 2016-05-04 DIAGNOSIS — Z590 Homelessness: Secondary | ICD-10-CM | POA: Diagnosis not present

## 2016-05-04 DIAGNOSIS — I5022 Chronic systolic (congestive) heart failure: Secondary | ICD-10-CM | POA: Diagnosis present

## 2016-05-04 DIAGNOSIS — Z7982 Long term (current) use of aspirin: Secondary | ICD-10-CM | POA: Diagnosis not present

## 2016-05-04 DIAGNOSIS — Z794 Long term (current) use of insulin: Secondary | ICD-10-CM | POA: Diagnosis not present

## 2016-05-04 DIAGNOSIS — Z79899 Other long term (current) drug therapy: Secondary | ICD-10-CM | POA: Diagnosis not present

## 2016-05-04 DIAGNOSIS — N186 End stage renal disease: Secondary | ICD-10-CM | POA: Diagnosis not present

## 2016-05-04 DIAGNOSIS — G9340 Encephalopathy, unspecified: Secondary | ICD-10-CM | POA: Diagnosis present

## 2016-05-04 DIAGNOSIS — F1410 Cocaine abuse, uncomplicated: Secondary | ICD-10-CM | POA: Diagnosis present

## 2016-05-04 DIAGNOSIS — E785 Hyperlipidemia, unspecified: Secondary | ICD-10-CM | POA: Diagnosis present

## 2016-05-04 DIAGNOSIS — I132 Hypertensive heart and chronic kidney disease with heart failure and with stage 5 chronic kidney disease, or end stage renal disease: Secondary | ICD-10-CM | POA: Diagnosis present

## 2016-05-04 DIAGNOSIS — E1122 Type 2 diabetes mellitus with diabetic chronic kidney disease: Secondary | ICD-10-CM | POA: Diagnosis present

## 2016-05-04 DIAGNOSIS — E8770 Fluid overload, unspecified: Secondary | ICD-10-CM | POA: Diagnosis present

## 2016-05-04 DIAGNOSIS — Z9115 Patient's noncompliance with renal dialysis: Secondary | ICD-10-CM | POA: Diagnosis not present

## 2016-05-04 DIAGNOSIS — Z9119 Patient's noncompliance with other medical treatment and regimen: Secondary | ICD-10-CM | POA: Diagnosis not present

## 2016-05-04 DIAGNOSIS — E1121 Type 2 diabetes mellitus with diabetic nephropathy: Secondary | ICD-10-CM | POA: Diagnosis present

## 2016-05-04 DIAGNOSIS — Z23 Encounter for immunization: Secondary | ICD-10-CM | POA: Diagnosis not present

## 2016-05-04 LAB — CBC
HCT: 29.4 % — ABNORMAL LOW (ref 39.0–52.0)
Hemoglobin: 10 g/dL — ABNORMAL LOW (ref 13.0–17.0)
MCH: 30 pg (ref 26.0–34.0)
MCHC: 34 g/dL (ref 30.0–36.0)
MCV: 88.3 fL (ref 78.0–100.0)
Platelets: 102 10*3/uL — ABNORMAL LOW (ref 150–400)
RBC: 3.33 MIL/uL — ABNORMAL LOW (ref 4.22–5.81)
RDW: 16 % — ABNORMAL HIGH (ref 11.5–15.5)
WBC: 5.7 10*3/uL (ref 4.0–10.5)

## 2016-05-04 LAB — RENAL FUNCTION PANEL
ALBUMIN: 2.6 g/dL — AB (ref 3.5–5.0)
ANION GAP: 16 — AB (ref 5–15)
Albumin: 2.7 g/dL — ABNORMAL LOW (ref 3.5–5.0)
Anion gap: 16 — ABNORMAL HIGH (ref 5–15)
BUN: 88 mg/dL — ABNORMAL HIGH (ref 6–20)
BUN: 87 mg/dL — ABNORMAL HIGH (ref 6–20)
CHLORIDE: 92 mmol/L — AB (ref 101–111)
CO2: 22 mmol/L (ref 22–32)
CO2: 21 mmol/L — ABNORMAL LOW (ref 22–32)
Calcium: 8.7 mg/dL — ABNORMAL LOW (ref 8.9–10.3)
Calcium: 8.8 mg/dL — ABNORMAL LOW (ref 8.9–10.3)
Chloride: 92 mmol/L — ABNORMAL LOW (ref 101–111)
Creatinine, Ser: 11.33 mg/dL — ABNORMAL HIGH (ref 0.61–1.24)
Creatinine, Ser: 11.32 mg/dL — ABNORMAL HIGH (ref 0.61–1.24)
GFR calc Af Amer: 5 mL/min — ABNORMAL LOW (ref >60)
GFR calc Af Amer: 5 mL/min — ABNORMAL LOW (ref >60)
GFR calc non Af Amer: 4 mL/min — ABNORMAL LOW (ref >60)
GFR, EST NON AFRICAN AMERICAN: 4 mL/min — AB (ref >60)
Glucose, Bld: 157 mg/dL — ABNORMAL HIGH (ref 65–99)
Glucose, Bld: 144 mg/dL — ABNORMAL HIGH (ref 65–99)
PHOSPHORUS: 6 mg/dL — AB (ref 2.5–4.6)
POTASSIUM: 4.2 mmol/L (ref 3.5–5.1)
Phosphorus: 5.9 mg/dL — ABNORMAL HIGH (ref 2.5–4.6)
Potassium: 4.3 mmol/L (ref 3.5–5.1)
Sodium: 130 mmol/L — ABNORMAL LOW (ref 135–145)
Sodium: 129 mmol/L — ABNORMAL LOW (ref 135–145)

## 2016-05-04 LAB — GLUCOSE, CAPILLARY
GLUCOSE-CAPILLARY: 140 mg/dL — AB (ref 65–99)
GLUCOSE-CAPILLARY: 235 mg/dL — AB (ref 65–99)
GLUCOSE-CAPILLARY: 170 mg/dL — AB (ref 65–99)

## 2016-05-04 MED ORDER — DOXERCALCIFEROL 4 MCG/2ML IV SOLN
INTRAVENOUS | Status: AC
  Filled 2016-05-04: qty 2

## 2016-05-04 MED ORDER — LIDOCAINE HCL (PF) 1 % IJ SOLN: 5.0000 mL | INTRAMUSCULAR | Status: DC | PRN

## 2016-05-04 MED ORDER — LIDOCAINE-PRILOCAINE 2.5-2.5 % EX CREA: 1.0000 "application " | TOPICAL_CREAM | CUTANEOUS | Status: DC | PRN

## 2016-05-04 MED ORDER — SODIUM CHLORIDE 0.9 % IV SOLN: 100.0000 mL | INTRAVENOUS | Status: DC | PRN

## 2016-05-04 MED ORDER — ALTEPLASE 2 MG IJ SOLR: 2.0000 mg | Freq: Once | INTRAMUSCULAR | Status: DC | PRN

## 2016-05-04 MED ORDER — PENTAFLUOROPROP-TETRAFLUOROETH EX AERO: 1.0000 "application " | INHALATION_SPRAY | CUTANEOUS | Status: DC | PRN

## 2016-05-04 MED ORDER — INFLUENZA VAC SPLIT QUAD 0.5 ML IM SUSY
0.5000 mL | PREFILLED_SYRINGE | INTRAMUSCULAR | Status: AC
  Administered 2016-05-05: 0.5 mL via INTRAMUSCULAR
  Filled 2016-05-04: qty 0.5

## 2016-05-04 MED ORDER — HEPARIN SODIUM (PORCINE) 1000 UNIT/ML DIALYSIS: 20.0000 [IU]/kg | INTRAMUSCULAR | Status: DC | PRN

## 2016-05-04 MED ORDER — HEPARIN SODIUM (PORCINE) 1000 UNIT/ML DIALYSIS: 1000.0000 [IU] | INTRAMUSCULAR | Status: DC | PRN

## 2016-05-04 NOTE — Progress Notes (Signed)
PT Cancellation Note  Patient Details Name: [name] MRN: [id number] DOB: 12-May-1951   Cancelled Treatment:    Reason Eval/Treat Not Completed: Patient at procedure or test/unavailable. Unable to complete PT eval. Pt in HD. Will re-attempt as time allows.   Ilda Foil 05/04/2016, 8:36 AM

## 2016-05-04 NOTE — Progress Notes (Signed)
Patient stated that he did not think he received his pneumonia vaccine at the hemodialysis center.  Spoke with Darel Hong, Diplomatic Services operational officer with [facility].  Darel Hong had called his center and was informed that he had not received the pneumonia vaccine yet.  Patient has stated he wants both the pneumonia and flu vaccine now.

## 2016-05-04 NOTE — Progress Notes (Signed)
PROGRESS NOTE  [name] NGE:[id number] DOB: [birth year] DOA: 05/02/2016 PCP: No PCP Per Patient   LOS: 0 days   Brief Narrative: 65 y.o.gentleman with a history of ESRD on HD T/Th/Sat, paroxysmal atrial fibrillation (CHADS-Vasc score of at least 3, presumably not anticoagulated due to history of polysubstance abuse, maintaining NSR on amiodarone, followed by Dr. Teressa LowerBensimohn), diabetes, and chronic systolic heart failure who presents to the ED complaining of weakness and encephalopathy  Assessment & Plan: Principal Problem:   ESRD (end stage renal disease) (HCC) Active Problems:   Type 2 diabetes mellitus with diabetic nephropathy, without long-term current use of insulin (HCC)   Generalized weakness   ESRD (end stage renal disease) on dialysis (HCC)   ESRD on HD T/Th/Sat with history of noncompliance - I suspect the patient had mild uremia at this point due to missed HD sessions. Anticipate HD in the AM. - HD today - consulted SW for homelessness.  - PT to eval, recommending SNF, discussed with SW  DM - SSI coverage. Hold long acting insulins for now. - most recent A1C 6.4 - CBS controlled  History of A fib - Continue amiodarone, baby aspirin for now. Currently in sinus. Probably not a candidate for AC due to homesness and non compliance  HLD - continue Atorvastatin    DVT prophylaxis: heparin Code Status: Full Family Communication: no family bedside Disposition Plan: PT to eval, dispo   Consultants:   Nephrology   Procedures:   HD: 9/25  Antimicrobials: None    Subjective: - no chest pain, shortness of breath, no abdominal pain, nausea or vomiting.   Objective: Vitals:   05/04/16 1130 05/04/16 1200 05/04/16 1202 05/04/16 1257  BP: 104/70 98/70 106/65 91/71  Pulse: 92 92 92 94  Resp:   14 18  Temp:   97.5 F (36.4 C) 97.5 F (36.4 C)  TempSrc:   Oral Oral  SpO2:   100% 100%  Weight:   110.8 kg (244 lb 4.3 oz)   Height:         Intake/Output Summary (Last 24 hours) at 05/04/16 1332 Last data filed at 05/04/16 1202  Gross per 24 hour  Intake              840 ml  Output             5001 ml  Net            -4161 ml   Filed Weights   05/03/16 2140 05/04/16 0800 05/04/16 1202  Weight: 120.2 kg (265 lb) 118.7 kg (261 lb 11 oz) 110.8 kg (244 lb 4.3 oz)    Examination: Constitutional: NAD Vitals:   05/04/16 1130 05/04/16 1200 05/04/16 1202 05/04/16 1257  BP: 104/70 98/70 106/65 91/71  Pulse: 92 92 92 94  Resp:   14 18  Temp:   97.5 F (36.4 C) 97.5 F (36.4 C)  TempSrc:   Oral Oral  SpO2:   100% 100%  Weight:   110.8 kg (244 lb 4.3 oz)   Height:       Eyes: PERRL, lids and conjunctivae normal Neck: normal, supple, no masses, no thyromegaly Respiratory: clear to auscultation bilaterally, no wheezing, no crackles.  Cardiovascular: Regular rate and rhythm, no murmurs / rubs / gallops. 1+ LE edema Abdomen: no tenderness. Bowel sounds positive.  Musculoskeletal: no clubbing / cyanosis. Neurologic: non focal    Data Reviewed: I have personally reviewed following labs and imaging studies  CBC:  Recent Labs Lab 04/30/16  1324 05/01/16 2316 05/02/16 1832 05/04/16 0816  WBC 5.7 5.9 6.1 5.7  NEUTROABS 3.7 4.2 4.2  --   HGB 12.8* 12.1* 11.8* 10.0*  HCT 38.2* 34.6* 35.7* 29.4*  MCV 91.0 87.8 90.4 88.3  PLT 93* 94* 92* 102*   Basic Metabolic Panel:  Recent Labs Lab 04/30/16 1324 05/01/16 2316 05/02/16 1832 05/04/16 0559 05/04/16 0817  NA 136 136 136 130* 129*  K 3.8 3.8 3.9 4.2 4.3  CL 93* 94* 93* 92* 92*  CO2 24 27 23 22  21*  GLUCOSE 156* 192* 184* 157* 144*  BUN 68* 55* 73* 88* 87*  CREATININE 12.12* 9.65* 10.29* 11.33* 11.32*  CALCIUM 9.2 8.7* 8.9 8.7* 8.8*  PHOS  --   --   --  6.0* 5.9*   GFR: Estimated Creatinine Clearance: 8.1 mL/min (by C-G formula based on SCr of 11.32 mg/dL (H)). Liver Function Tests:  Recent Labs Lab 05/02/16 1832 05/04/16 0559 05/04/16 0817  AST 36   --   --   ALT 22  --   --   ALKPHOS 72  --   --   BILITOT 3.0*  --   --   PROT 7.9  --   --   ALBUMIN 2.8* 2.6* 2.7*   No results for input(s): LIPASE, AMYLASE in the last 168 hours.  Recent Labs Lab 05/02/16 1832  AMMONIA 51*   Coagulation Profile: No results for input(s): INR, PROTIME in the last 168 hours. Cardiac Enzymes: No results for input(s): CKTOTAL, CKMB, CKMBINDEX, TROPONINI in the last 168 hours. BNP (last 3 results) No results for input(s): PROBNP in the last 8760 hours. HbA1C: No results for input(s): HGBA1C in the last 72 hours. CBG:  Recent Labs Lab 05/03/16 1135 05/03/16 1140 05/03/16 1719 05/03/16 2138 05/04/16 1254  GLUCAP 219* 190* 184* 213* 140*   Lipid Profile: No results for input(s): CHOL, HDL, LDLCALC, TRIG, CHOLHDL, LDLDIRECT in the last 72 hours. Thyroid Function Tests: No results for input(s): TSH, T4TOTAL, FREET4, T3FREE, THYROIDAB in the last 72 hours. Anemia Panel: No results for input(s): VITAMINB12, FOLATE, FERRITIN, TIBC, IRON, RETICCTPCT in the last 72 hours. Urine analysis:    Component Value Date/Time   COLORURINE RED (A) 09/13/2014 0952   APPEARANCEUR HAZY (A) 09/13/2014 0952   LABSPEC 1.025 09/13/2014 0952   PHURINE 5.0 09/13/2014 0952   GLUCOSEU 100 (A) 09/13/2014 0952   HGBUR NEGATIVE 09/13/2014 0952   BILIRUBINUR LARGE (A) 09/13/2014 0952   KETONESUR 15 (A) 09/13/2014 0952   PROTEINUR 30 (A) 09/13/2014 0952   UROBILINOGEN 2.0 (H) 09/13/2014 0952   NITRITE POSITIVE (A) 09/13/2014 0952   LEUKOCYTESUR SMALL (A) 09/13/2014 0952   Sepsis Labs: Invalid input(s): PROCALCITONIN, LACTICIDVEN  Recent Results (from the past 240 hour(s))  MRSA PCR Screening     Status: None   Collection Time: 05/03/16  1:10 AM  Result Value Ref Range Status   MRSA by PCR NEGATIVE NEGATIVE Final    Comment:        The GeneXpert MRSA Assay (FDA approved for NASAL specimens only), is one component of a comprehensive MRSA  colonization surveillance program. It is not intended to diagnose MRSA infection nor to guide or monitor treatment for MRSA infections.       Radiology Studies: No results found.   Scheduled Meds: . amiodarone  200 mg Oral Daily  . aspirin EC  81 mg Oral Daily  . atorvastatin  20 mg Oral QHS  . doxercalciferol      . [  START ON 05/05/2016] doxercalciferol  2 mcg Intravenous Q T,Th,Sa-HD  . heparin  5,000 Units Subcutaneous Q8H  . insulin aspart  0-5 Units Subcutaneous QHS  . insulin aspart  0-9 Units Subcutaneous TID WC  . multivitamin  1 tablet Oral QHS  . pneumococcal 23 valent vaccine  0.5 mL Intramuscular Tomorrow-1000  . sevelamer carbonate  1,600 mg Oral TID WC   Continuous Infusions:    Pamella Pert, MD, PhD [facility] Pager [phone]  If 7PM-7AM, please contact night-coverage [email or web address] Password [email or web address] 05/04/2016, 1:32 PM

## 2016-05-04 NOTE — NC FL2 (Signed)
[place] MEDICAID FL2 LEVEL OF CARE SCREENING TOOL     IDENTIFICATION  Patient Name: [name] MaceKenneth Tyson Birthdate: May 18, 1951 Sex: male Admission Date (Current Location): 05/02/2016  [facility] [place]   Facility and Address:  [facility] [place], [address], [place], [place] [address]      Provider Number: [id number]  Attending Physician Name and Address:  Leatha Gildingostin M Gherghe, MD  Relative Name and Phone Number:  Toni Amendolk,Darren Nephew [phone], Polk,Tameca Niece [phone], Jerffries,[name] Daughter [phone]       Current Level of Care: Hospital Recommended Level of Care: Skilled Nursing Facility Prior Approval Number:    Date Approved/Denied:   PASRR Number: [id number] A (Eff. 03/16/14)  Discharge Plan: SNF    Current Diagnoses: Patient Active Problem List   Diagnosis Date Noted  . ESRD (end stage renal disease) on dialysis (HCC) 05/03/2016  . Generalized weakness 05/02/2016  . Intrahepatic cholestasis 02/20/2016  . Direct hyperbilirubinemia   . Paroxysmal atrial fibrillation (HCC)   . Shortness of breath   . Type 2 diabetes mellitus with diabetic nephropathy, without long-term current use of insulin (HCC)   . End stage renal disease (HCC) 12/25/2014  . Atrial fibrillation [I48.91] 08/09/2014  . Encounter for therapeutic drug monitoring 08/09/2014  . Chronic systolic heart failure (HCC) 07/04/2014  . ESRD (end stage renal disease) (HCC) 07/04/2014  . PAF (paroxysmal atrial fibrillation) (HCC) 07/04/2014  . Controlled diabetes mellitus type II without complication (HCC) 06/22/2014  . Gout 06/22/2014  . ESRD needing dialysis (HCC)   . Pulmonary edema   . Bacteremia associated with intravascular line (HCC)   . Blood poisoning (HCC)   . Acute respiratory failure with hypoxia (HCC)   . AKI (acute kidney injury) (HCC)   . Screen for STD (sexually transmitted disease)   . Acute respiratory failure (HCC) 06/08/2014  .  Cardiogenic shock (HCC) 06/08/2014  . Acute on chronic systolic congestive heart failure (HCC) 06/08/2014  . Acute on chronic renal failure (HCC) 06/08/2014  . Hyperkalemia 06/08/2014  . Respiratory failure (HCC) 06/08/2014    Orientation RESPIRATION BLADDER Height & Weight     Self, Time, Situation, Place  Normal Continent Weight: 245 lb 6 oz (111.3 kg) Height:  6' (182.9 cm)  BEHAVIORAL SYMPTOMS/MOOD NEUROLOGICAL BOWEL NUTRITION STATUS      Continent Diet (Renal/carb modified)  AMBULATORY STATUS COMMUNICATION OF NEEDS Skin   Limited Assist Verbally Normal                       Personal Care Assistance Level of Assistance  Bathing, Feeding, Dressing Bathing Assistance: Limited assistance Feeding assistance: Independent Dressing Assistance: Limited assistance     Functional Limitations Info  Sight, Hearing, Speech Sight Info: Adequate Hearing Info: Adequate Speech Info: Adequate    SPECIAL CARE FACTORS FREQUENCY  PT (By licensed PT)     PT Frequency: Evaluated 9/25 and a minimum of 3X per week therapy evaluated              Contractures Contractures Info: Not present    Additional Factors Info  Code Status, Allergies, Insulin Sliding Scale Code Status Info: Full Allergies Info: No known allergies   Insulin Sliding Scale Info: 0-9 Units insulin 3X per day with meals; 0-5 Units insulin daily at bedtime       Current Medications (05/04/2016):  This is the current hospital active medication list Current Facility-Administered Medications  Medication Dose Route Frequency Provider Last Rate Last Dose  .  acetaminophen (TYLENOL) tablet 650 mg  650 mg Oral Q6H PRN Michael Litter, MD       Or  . acetaminophen (TYLENOL) suppository 650 mg  650 mg Rectal Q6H PRN Michael Litter, MD      . amiodarone (PACERONE) tablet 200 mg  200 mg Oral Daily Michael Litter, MD   200 mg at 05/04/16 1329  . aspirin EC tablet 81 mg  81 mg Oral Daily Michael Litter, MD   81 mg at 05/04/16 1329   . atorvastatin (LIPITOR) tablet 20 mg  20 mg Oral QHS Michael Litter, MD   20 mg at 05/04/16 2214  . [START ON 05/05/2016] doxercalciferol (HECTOROL) injection 2 mcg  2 mcg Intravenous Q T,Th,Sa-HD Weston Settle, PA-C      . heparin injection 5,000 Units  5,000 Units Subcutaneous Q8H Michael Litter, MD   5,000 Units at 05/04/16 2214  . hydrOXYzine (ATARAX/VISTARIL) tablet 25 mg  25 mg Oral TID PRN Michael Litter, MD   25 mg at 05/03/16 1740  . [START ON 05/05/2016] Influenza vac split quadrivalent PF (FLUARIX) injection 0.5 mL  0.5 mL Intramuscular Tomorrow-1000 Costin Otelia Sergeant, MD      . insulin aspart (novoLOG) injection 0-5 Units  0-5 Units Subcutaneous QHS Michael Litter, MD   2 Units at 05/03/16 2151  . insulin aspart (novoLOG) injection 0-9 Units  0-9 Units Subcutaneous TID WC Michael Litter, MD   2 Units at 05/04/16 1758  . multivitamin (RENA-VIT) tablet 1 tablet  1 tablet Oral QHS Weston Settle, PA-C   1 tablet at 05/04/16 2214  . ondansetron (ZOFRAN) tablet 4 mg  4 mg Oral Q6H PRN Michael Litter, MD       Or  . ondansetron [facility]) injection 4 mg  4 mg Intravenous Q6H PRN Michael Litter, MD      . sevelamer carbonate (RENVELA) tablet 1,600 mg  1,600 mg Oral TID WC Michael Litter, MD   1,600 mg at 05/04/16 1758  . sevelamer carbonate (RENVELA) tablet 800 mg  800 mg Oral PRN Michael Litter, MD         Discharge Medications: Please see discharge summary for a list of discharge medications.  Relevant Imaging Results:  Relevant Lab Results:   Additional Information ss#[id number].  Dialysis patient TTS at [facility]  [place], [place], [place]

## 2016-05-04 NOTE — Progress Notes (Signed)
Subjective:  On hd / no cos   Objective Vital signs in last 24 hours: Vitals:   05/04/16 0802 05/04/16 0830 05/04/16 0900 05/04/16 0930  BP: (!) 94/53 (!) 106/58 (!) 100/56 (!) 105/56  Pulse: 99 96 93 94  Resp: 17     Temp:      TempSrc:      SpO2:      Weight:      Height:       Weight change: 4.082 kg (9 lb)  Physical Exam: General: on HD ,sleeping awoken nad  Adult AAM ,sl leathargic  But able to recite HD Day today Monday  Heart: RRR  No rub or mur  Lungs: CTA bilat / nonlabored  Abdomen: BS pos, soft , nontender/ nondistended Extremities:  Bipedal edema 2+/ Dialysis Access: patent on HD R Arm AVF    OP  HD orders:  TTS [facility] - 4hr EDW 105   2 K 2.25 Ca 450/800 right upper AVF hectorol 2 heparin 6000 no Mircera or Fe Recent labs: hgb 1.26 9/22 22%sat in August P 7.6 iPTH 474 Ca ok LAST  HD was 9/22 for 4 hours - it was his only dialysis last week - prior to that he dialyzed 9/16 for almost 3 hr   :Problem/Plan: 1. ESRD -on  HD sec to missed HD /4.3 k  2. BP /Volume-  13 kg up per  Bed wt. Has pedal edema / thinks can stand post hd for wt  BUT NOTED = the closest he has gotten to edw was 107.8 on 9/16 -needs serial HD to get volume down;and  not on BP meds. 3, Anemia - hgb 10  Monitor / no esa  4. SHPT- hectorol/binders 5. Hx afib - on amio when he has meds. 6. Cocaine - ongoing issue -  7. Homelessness - no transportation; complicating health care management-  SW to see today . 8. Disp - Hd this am and SW to see  with some options hopefully  for d/c  then can continue with outpatient HD on Tuesday.  Lenny Pastelavid Mitzy Naron, PA-C [facility] Bee[facility] [phone] 05/04/2016,10:16 AM  LOS: 0 days   Labs: Basic Metabolic Panel:  Recent Labs Lab 05/02/16 1832 05/04/16 0559 05/04/16 0817  NA 136 130* 129*  K 3.9 4.2 4.3  CL 93* 92* 92*  CO2 23 22 21*  GLUCOSE 184* 157* 144*  BUN 73* 88* 87*  CREATININE 10.29* 11.33* 11.32*  CALCIUM 8.9 8.7* 8.8*  PHOS  --   6.0* 5.9*   Liver Function Tests:  Recent Labs Lab 05/02/16 1832 05/04/16 0559 05/04/16 0817  AST 36  --   --   ALT 22  --   --   ALKPHOS 72  --   --   BILITOT 3.0*  --   --   PROT 7.9  --   --   ALBUMIN 2.8* 2.6* 2.7*   No results for input(s): LIPASE, AMYLASE in the last 168 hours.  Recent Labs Lab 05/02/16 1832  AMMONIA 51*   CBC:  Recent Labs Lab 04/30/16 1324 05/01/16 2316 05/02/16 1832 05/04/16 0816  WBC 5.7 5.9 6.1 5.7  NEUTROABS 3.7 4.2 4.2  --   HGB 12.8* 12.1* 11.8* 10.0*  HCT 38.2* 34.6* 35.7* 29.4*  MCV 91.0 87.8 90.4 88.3  PLT 93* 94* 92* 102*   Cardiac Enzymes: No results for input(s): CKTOTAL, CKMB, CKMBINDEX, TROPONINI in the last 168 hours. CBG:  Recent Labs Lab 05/03/16 0755 05/03/16 1135  05/03/16 1140 05/03/16 1719 05/03/16 2138  GLUCAP 145* 219* 190* 184* 213*    Studies/Results: No results found. Medications:   . doxercalciferol      . amiodarone  200 mg Oral Daily  . aspirin EC  81 mg Oral Daily  . atorvastatin  20 mg Oral QHS  . [START ON 05/05/2016] doxercalciferol  2 mcg Intravenous Q T,Th,Sa-HD  . heparin  5,000 Units Subcutaneous Q8H  . insulin aspart  0-5 Units Subcutaneous QHS  . insulin aspart  0-9 Units Subcutaneous TID WC  . multivitamin  1 tablet Oral QHS  . pneumococcal 23 valent vaccine  0.5 mL Intramuscular Tomorrow-1000  . sevelamer carbonate  1,600 mg Oral TID WC

## 2016-05-04 NOTE — Evaluation (Signed)
Physical Therapy Evaluation Patient Details Name: [name] MRN: [id number] DOB: [birth year] Today's Date: 05/04/2016   History of Present Illness  [name] is a 65 y.o. gentleman with a history of ESRD on HD T/Th/Sat, paroxysmal atrial fibrillation (CHADS-Vasc score of at least 3, presumably not anticoagulated due to history of polysubstance abuse, maintaining NSR on amiodarone, followed by Dr. Teressa LowerBensimohn), diabetes, and chronic systolic heart failure who presents to the ED complaining of weakness.  The patient has a history of medical noncompliance and missed HD session.  Clinical Impression   Pt admitted with above diagnosis. Pt currently with functional limitations due to the deficits listed below (see PT Problem List). Pt will benefit from skilled PT to increase their independence and safety with mobility to allow discharge to the venue listed below.       Follow Up Recommendations SNF    Equipment Recommendations  Rolling walker with 5" wheels;3in1 (PT)    Recommendations for Other Services       Precautions / Restrictions Precautions Precautions: Fall      Mobility  Bed Mobility Overal bed mobility: Needs Assistance Bed Mobility: Supine to Sit     Supine to sit: Min assist     General bed mobility comments: Handheld assist to pull to sit  Transfers Overall transfer level: Needs assistance Equipment used: Rolling walker (2 wheeled) Transfers: Sit to/from Stand Sit to Stand: Mod assist         General transfer comment: Light mod assist to power up  Ambulation/Gait Ambulation/Gait assistance: Min guard Ambulation Distance (Feet):  (sidesteps to [facility]) Assistive device: Rolling walker (2 wheeled)          Stairs            Wheelchair Mobility    Modified Rankin (Stroke Patients Only)       Balance Overall balance assessment: Needs assistance           Standing balance-Leahy Scale: Poor                                Pertinent Vitals/Pain Pain Assessment: No/denies pain    Home Living Family/patient expects to be discharged to:: Other (Comment) Living Arrangements: Other (Comment) (Pt didn't mention; experiencing homelessness) Available Help at Discharge: Other (Comment)             Additional Comments: Experiencing homelessness per Dr. Elvera LennoxGherghe    Prior Function Level of Independence: Independent               Hand Dominance        Extremity/Trunk Assessment   Upper Extremity Assessment: Generalized weakness           Lower Extremity Assessment: Generalized weakness         Communication   Communication: No difficulties  Cognition Arousal/Alertness: Awake/alert Behavior During Therapy: WFL for tasks assessed/performed (needing encouragement to participate) Overall Cognitive Status: Within Functional Limits for tasks assessed                      General Comments General comments (skin integrity, edema, etc.): Had HD earlier today    Exercises     Assessment/Plan    PT Assessment Patient needs continued PT services  PT Problem List Decreased strength;Decreased activity tolerance;Decreased balance;Decreased mobility;Decreased knowledge of use of DME;Decreased knowledge of precautions          PT Treatment Interventions DME instruction;Gait training;Stair training;Functional mobility  training;Therapeutic activities;Therapeutic exercise;Patient/family education    PT Goals (Current goals can be found in the Care Plan section)  Acute Rehab PT Goals Patient Stated Goal: did not state PT Goal Formulation: With patient Time For Goal Achievement: 05/18/16 Potential to Achieve Goals: Fair    Frequency Min 3X/week   Barriers to discharge        Co-evaluation               End of Session Equipment Utilized During Treatment: Gait belt Activity Tolerance: Patient limited by fatigue Patient left: in bed;with call bell/phone within  reach;Other (comment) (sitting EOB for lunch) Nurse Communication: Mobility status    Functional Assessment Tool Used: Clinical Judgement Functional Limitation: Mobility: Walking and moving around Mobility: Walking and Moving Around Current Status (R4431): At least 20 percent but less than 40 percent impaired, limited or restricted Mobility: Walking and Moving Around Goal Status [phone]): 0 percent impaired, limited or restricted    Time: 1410-1420 PT Time Calculation (min) (ACUTE ONLY): 10 min   Charges:   PT Evaluation $PT Eval Moderate Complexity: 1 Procedure     PT G Codes:   PT G-Codes **NOT FOR INPATIENT CLASS** Functional Assessment Tool Used: Clinical Judgement Functional Limitation: Mobility: Walking and moving around Mobility: Walking and Moving Around Current Status (Q7619): At least 20 percent but less than 40 percent impaired, limited or restricted Mobility: Walking and Moving Around Goal Status [phone]): 0 percent impaired, limited or restricted    Van Clines [facility] 05/04/2016, 4:32 PM  Van Clines, PT  Acute Rehabilitation Services Pager [phone] Office [phone]

## 2016-05-04 NOTE — Procedures (Signed)
Patient seen on Hemodialysis. QB 400 UF goal 5L . Pt tolerating well. Will attempt standing weight Treatment adjusted as needed.  Bufford Buttner MD [facility]. Cell: [phone] Pager # [phone] 10:39 AM

## 2016-05-05 LAB — RENAL FUNCTION PANEL
ANION GAP: 16 — AB (ref 5–15)
Albumin: 2.7 g/dL — ABNORMAL LOW (ref 3.5–5.0)
BUN: 59 mg/dL — ABNORMAL HIGH (ref 6–20)
CHLORIDE: 91 mmol/L — AB (ref 101–111)
CO2: 22 mmol/L (ref 22–32)
Calcium: 8.9 mg/dL (ref 8.9–10.3)
Creatinine, Ser: 8.5 mg/dL — ABNORMAL HIGH (ref 0.61–1.24)
GFR calc non Af Amer: 6 mL/min — ABNORMAL LOW (ref >60)
GFR, EST AFRICAN AMERICAN: 7 mL/min — AB (ref >60)
Glucose, Bld: 164 mg/dL — ABNORMAL HIGH (ref 65–99)
Phosphorus: 4.8 mg/dL — ABNORMAL HIGH (ref 2.5–4.6)
Potassium: 4.4 mmol/L (ref 3.5–5.1)
Sodium: 129 mmol/L — ABNORMAL LOW (ref 135–145)

## 2016-05-05 LAB — GLUCOSE, CAPILLARY
GLUCOSE-CAPILLARY: 154 mg/dL — AB (ref 65–99)
GLUCOSE-CAPILLARY: 117 mg/dL — AB (ref 65–99)
GLUCOSE-CAPILLARY: 253 mg/dL — AB (ref 65–99)
Glucose-Capillary: 158 mg/dL — ABNORMAL HIGH (ref 65–99)

## 2016-05-05 MED ORDER — INSULIN REGULAR HUMAN 100 UNIT/ML IJ SOLN: 2.0000 [IU] | Freq: Three times a day (TID) | INTRAMUSCULAR | 1 refills | Status: DC

## 2016-05-05 MED ORDER — ZOLPIDEM TARTRATE 5 MG PO TABS
5.0000 mg | ORAL_TABLET | Freq: Every evening | ORAL | Status: DC | PRN
  Administered 2016-05-05 (×2): 5 mg via ORAL
  Filled 2016-05-05 (×2): qty 1

## 2016-05-05 MED ORDER — DOXERCALCIFEROL 4 MCG/2ML IV SOLN
INTRAVENOUS | Status: AC
  Administered 2016-05-05: 2 ug via INTRAVENOUS
  Filled 2016-05-05: qty 2

## 2016-05-05 MED ORDER — ASPIRIN 81 MG PO TBEC: 81.0000 mg | DELAYED_RELEASE_TABLET | Freq: Every day | ORAL | Status: AC

## 2016-05-05 NOTE — Procedures (Signed)
Patient seen on Hemodialysis. QB 400 UF goal 3.5L.  Continue to chip away at volume overload.   Treatment adjusted as needed.  Bufford Buttner MD [facility]. Cell [phone] Pager # [phone] 12:06 PM

## 2016-05-05 NOTE — Clinical Social Work Note (Signed)
CSW talked with patient regarding SNF placement for ST rehab and he is in agreement (full assessment to follow). CSW worked with Darel Hong, Higher education careers adviser and Onalee Hua, renal PA regarding temporary change to dialysis center due to SNF placement. Patient will discharge when medically stable to [facility] and receive dialysis at the [facility] HD center in [place].  Genelle Bal, MSW, LCSW Licensed Clinical Social Worker Clinical Social Work Department [employer] [phone]

## 2016-05-05 NOTE — Progress Notes (Signed)
Nutrition Brief Note  Patient identified on the Malnutrition Screening Tool (MST) Report  Wt Readings from Last 15 Encounters:  05/05/16 254 lb 3.1 oz (115.3 kg)  04/30/16 256 lb 8 oz (116.3 kg)  02/22/16 230 lb 6.1 oz (104.5 kg)  12/25/14 231 lb 4.8 oz (104.9 kg)  10/25/14 230 lb (104.3 kg)  09/13/14 229 lb (103.9 kg)  08/23/14 229 lb 6.4 oz (104.1 kg)  08/21/14 229 lb (103.9 kg)  07/26/14 235 lb 4 oz (106.7 kg)  07/04/14 233 lb (105.7 kg)  06/27/14 220 lb 7.4 oz (100 kg)    Body mass index is 34.47 kg/m. Patient meets criteria for Obesity based on current BMI.   Current diet order is Renal/Carb Modified, patient is consuming approximately 100% of meals at this time. Labs and medications reviewed. No evidence of weight loss.   No nutrition interventions warranted at this time. If nutrition issues arise, please consult RD.   Dorothea Ogle RD, CSP, LDN Inpatient Clinical Dietitian Pager: [phone] After Hours Pager: [phone]

## 2016-05-05 NOTE — Progress Notes (Signed)
Subjective:  Eating brk , feells stronger after full HD tx yesterday/  for HD today = normal schedule TTS/ Noted  For [facility] LPN[facility]  Objective Vital signs in last 24 hours: Vitals:   05/04/16 1257 05/04/16 1859 05/04/16 2003 05/05/16 0455  BP: 91/71 104/74 101/68 109/73  Pulse: 94 99 (!) 103 99  Resp: 18 (!) 22 (!) 21 20  Temp: 97.5 F (36.4 C) 98.6 F (37 C) 97.9 F (36.6 C) 98 F (36.7 C)  TempSrc: Oral Oral Oral Oral  SpO2: 100% 99% 99% 93%  Weight:   111.3 kg (245 lb 6 oz)   Height:       Weight change: -1.503 kg (-3 lb 5 oz)  Physical Exam: General:   Adult AAM, alert OX3  ( more alert than yesterday) Heart: RRR  No rub or mur  Lungs: CTA bilat / nonlabored  Abdomen: BS pos, soft , nontender/ nondistended Extremities:  Bipedal edema 2+/ Dialysis Access:  R FA  AVF bruit     OP  HD orders: TTS [facility] - 4hr EDW 105   2 K 2.25 Ca 450/800 right upper AVF hectorol 2 heparin 6000 no Mircera or Fe Recent labs: hgb 1.26 9/22 22%sat in August P 7.6 iPTH 474 Ca ok LAST HD was 9/22 for 4 hours - it was his only dialysis last week - prior to that he dialyzed 9/16 for almost 3 hr   :Problem/Plan:  1. ESRD -HD Again today sec to missed op HD txs/  Op schedule = TTS 2. BP /Volume- 5.5 l uf yesterday to 111.3 kg  / standing wt yest post still 6 kg >edw/still  Has pedal edema / NOTED = the closest he has gotten to edw ( as OP)was 107.8 on 9/16 -serial HD to get volume down; but  With Lethargy resolved  And not  hypoxic  Next HD Thursday on schedule /not on BP meds. 3,Anemia- hgb 10.0 yest.  no esa / trending down  / start  Aranesp   With HO CM next tx  4. 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 Ho Cm  followed by Dr. Gala RomneyBensimhon 7 Cocaine- ongoing issue as op  8 Homelessness - no transportation; complicating health care management-  SW to seeking  NHP . 9 Disp - Hd today  and SW to seeking NHP ? Dc today if NHP found  And [facility]   Nandana Krolikowski, PA-C [facility] Beeper [phone] 05/05/2016,9:00 AM  LOS: 1 day   Labs: Basic Metabolic Panel:  Recent Labs Lab 05/04/16 0559 05/04/16 0817 05/05/16 0558  NA 130* 129* 129*  K 4.2 4.3 4.4  CL 92* 92* 91*  CO2 22 21* 22  GLUCOSE 157* 144* 164*  BUN 88* 87* 59*  CREATININE 11.33* 11.32* 8.50*  CALCIUM 8.7* 8.8* 8.9  PHOS 6.0* 5.9* 4.8*   Liver Function Tests:  Recent Labs Lab 05/02/16 1832 05/04/16 0559 05/04/16 0817 05/05/16 0558  AST 36  --   --   --   ALT 22  --   --   --   ALKPHOS 72  --   --   --   BILITOT 3.0*  --   --   --   PROT 7.9  --   --   --   ALBUMIN 2.8* 2.6* 2.7* 2.7*   No results for input(s): LIPASE, AMYLASE in the last 168 hours.  Recent Labs Lab 05/02/16 1832  AMMONIA 51*   CBC:  Recent Labs Lab 04/30/16 1324 05/01/16 2316 05/02/16 1832 05/04/16 0816  WBC 5.7 5.9 6.1 5.7  NEUTROABS 3.7 4.2 4.2  --   HGB 12.8* 12.1* 11.8* 10.0*  HCT 38.2* 34.6* 35.7* 29.4*  MCV 91.0 87.8 90.4 88.3  PLT 93* 94* 92* 102*   Cardiac Enzymes: No results for input(s): CKTOTAL, CKMB, CKMBINDEX, TROPONINI in the last 168 hours. CBG:  Recent Labs Lab 05/03/16 2138 05/04/16 1254 05/04/16 1721 05/04/16 2000 05/05/16 0749  GLUCAP 213* 140* 235* 170* 154*    Studies/Results: No results found. Medications:   . amiodarone  200 mg Oral Daily  . aspirin EC  81 mg Oral Daily  . atorvastatin  20 mg Oral QHS  . doxercalciferol  2 mcg Intravenous Q T,Th,Sa-HD  . heparin  5,000 Units Subcutaneous Q8H  . Influenza vac split quadrivalent PF  0.5 mL Intramuscular Tomorrow-1000  . insulin aspart  0-5 Units Subcutaneous QHS  . insulin aspart  0-9 Units Subcutaneous TID WC  . multivitamin  1 tablet Oral QHS  . sevelamer carbonate  1,600 mg Oral TID WC

## 2016-05-05 NOTE — Discharge Summary (Addendum)
Physician Discharge Summary  [name] IDP:[id number] DOB: 12-Sep-1950 DOA: 05/02/2016  PCP: No PCP Per Patient  Admit date: 05/02/2016 Discharge date: 05/05/2016  Admitted From: homeless Disposition:  SNF  Recommendations for Outpatient Follow-up:  1. Hemodialysis as scheduled after discharge  Discharge Condition: stable CODE STATUS: Full Diet recommendation: renal  HPI: Per Dr. Montez Morita, [name] is a 65 y.o. gentleman with a history of ESRD on HD T/Th/Sat, paroxysmal atrial fibrillation (CHADS-Vasc score of at least 3, presumably not anticoagulated due to history of polysubstance abuse, maintaining NSR on amiodarone, followed by Dr. Teressa Lower), diabetes, and chronic systolic heart failure who presents to the ED complaining of weakness.  The patient has a history of medical noncompliance and missed HD session.  Apparently, he was just in the ED at [facility] last night.  There were no indications for admission then, and the patient was supposed to discharge so that he could undergo routine HD at his outpatient unit today.  Apparently, the patient was unable to be dialyzed and there was some concern about his mental status, so he was referred back to the ED at [facility].  The patient knows that he is mildly disoriented, but he does not have any localizing symptoms of complaints.  He is hungry and asking for his third sandwich in the ED.   Hospital Course: Discharge Diagnoses:  Principal Problem:   ESRD (end stage renal disease) (HCC) Active Problems:   Type 2 diabetes mellitus with diabetic nephropathy, without long-term current use of insulin (HCC)   Generalized weakness   ESRD (end stage renal disease) on dialysis (HCC)   ESRD on HD T/Th/Sat with history of noncompliance - I suspect the patient had mild uremia on admission at this point due to missed HD sessions. Nephrology was consulted and have followed patient hospitalized. Patient underwent hemodialysis on 9/25 in 9/26. He  improved and he is now back to baseline. Deconditioning/weakness - physical therapy evaluated patient and recommended skilled nursing level of care. Patient agreeable. Social work involved, will discharge to SNF. DM - SSI coverage. Most recent A1C 6.4, continue insulin regimen on discharge. History of A fib - Continue amiodarone, baby aspirin for now. Currently in sinus. Probably not a candidate for [facility] due to non compliance HLD - continue Atorvastatin   Discharge Instructions     Medication List    TAKE these medications   amiodarone 200 MG tablet Commonly known as:  PACERONE Take 1 tablet (200 mg total) by mouth daily.   aspirin 81 MG EC tablet Take 1 tablet (81 mg total) by mouth daily.   atorvastatin 20 MG tablet Commonly known as:  LIPITOR Take 20 mg by mouth at bedtime.   insulin NPH Human 100 UNIT/ML injection Commonly known as:  HUMULIN N,NOVOLIN N Inject 0.12 mLs (12 Units total) into the skin at bedtime.   insulin regular 100 units/mL injection Commonly known as:  NOVOLIN R,HUMULIN R Inject 0.02 mLs (2 Units total) into the skin 3 (three) times daily before meals. What changed:  how much to take   sevelamer carbonate 800 MG tablet Commonly known as:  RENVELA Take 3 tablets (2,400 mg total) by mouth 3 (three) times daily with meals.     No Known Allergies  Consultations:  Nephrology  Procedures/Studies:   No results found.   Subjective: - no chest pain, shortness of breath, no abdominal pain, nausea or vomiting.   Discharge Exam: Vitals:   05/05/16 1000 05/05/16 1030  BP: 95/72 90/73  Pulse: 100 95  Resp:    Temp:     Vitals:   05/05/16 0914 05/05/16 0930 05/05/16 1000 05/05/16 1030  BP: 114/84 101/74 95/72 90/73   Pulse: 99 98 100 95  Resp: 17 18    Temp:      TempSrc:      SpO2:  98%    Weight:      Height:       General: Pt is alert, awake, not in acute distress Cardiovascular: RRR, S1/S2 +, no rubs, no gallops Respiratory: CTA  bilaterally, no wheezing, no rhonchi Abdominal: Soft, NT, ND, bowel sounds + Extremities: 1+ LE edema, no cyanosis   The results of significant diagnostics from this hospitalization (including imaging, microbiology, ancillary and laboratory) are listed below for reference.     Microbiology: Recent Results (from the past 240 hour(s))  MRSA PCR Screening     Status: None   Collection Time: 05/03/16  1:10 AM  Result Value Ref Range Status   MRSA by PCR NEGATIVE NEGATIVE Final    Comment:        The GeneXpert MRSA Assay (FDA approved for NASAL specimens only), is one component of a comprehensive MRSA colonization surveillance program. It is not intended to diagnose MRSA infection nor to guide or monitor treatment for MRSA infections.      Labs: BNP (last 3 results) No results for input(s): BNP in the last 8760 hours. Basic Metabolic Panel:  Recent Labs Lab 05/01/16 2316 05/02/16 1832 05/04/16 0559 05/04/16 0817 05/05/16 0558  NA 136 136 130* 129* 129*  K 3.8 3.9 4.2 4.3 4.4  CL 94* 93* 92* 92* 91*  CO2 27 23 22  21* 22  GLUCOSE 192* 184* 157* 144* 164*  BUN 55* 73* 88* 87* 59*  CREATININE 9.65* 10.29* 11.33* 11.32* 8.50*  CALCIUM 8.7* 8.9 8.7* 8.8* 8.9  PHOS  --   --  6.0* 5.9* 4.8*   Liver Function Tests:  Recent Labs Lab 05/02/16 1832 05/04/16 0559 05/04/16 0817 05/05/16 0558  AST 36  --   --   --   ALT 22  --   --   --   ALKPHOS 72  --   --   --   BILITOT 3.0*  --   --   --   PROT 7.9  --   --   --   ALBUMIN 2.8* 2.6* 2.7* 2.7*   No results for input(s): LIPASE, AMYLASE in the last 168 hours.  Recent Labs Lab 05/02/16 1832  AMMONIA 51*   CBC:  Recent Labs Lab 04/30/16 1324 05/01/16 2316 05/02/16 1832 05/04/16 0816  WBC 5.7 5.9 6.1 5.7  NEUTROABS 3.7 4.2 4.2  --   HGB 12.8* 12.1* 11.8* 10.0*  HCT 38.2* 34.6* 35.7* 29.4*  MCV 91.0 87.8 90.4 88.3  PLT 93* 94* 92* 102*   Cardiac Enzymes: No results for input(s): CKTOTAL, CKMB,  CKMBINDEX, TROPONINI in the last 168 hours. BNP: Invalid input(s): POCBNP CBG:  Recent Labs Lab 05/03/16 2138 05/04/16 1254 05/04/16 1721 05/04/16 2000 05/05/16 0749  GLUCAP 213* 140* 235* 170* 154*   Urinalysis    Component Value Date/Time   COLORURINE RED (A) 09/13/2014 0952   APPEARANCEUR HAZY (A) 09/13/2014 0952   LABSPEC 1.025 09/13/2014 0952   PHURINE 5.0 09/13/2014 0952   GLUCOSEU 100 (A) 09/13/2014 0952   HGBUR NEGATIVE 09/13/2014 0952   BILIRUBINUR LARGE (A) 09/13/2014 0952   KETONESUR 15 (A) 09/13/2014 0952   PROTEINUR 30 (A) 09/13/2014 09810952  UROBILINOGEN 2.0 (H) 09/13/2014 0952   NITRITE POSITIVE (A) 09/13/2014 0952   LEUKOCYTESUR SMALL (A) 09/13/2014 0952   Sepsis Labs Invalid input(s): PROCALCITONIN,  WBC,  LACTICIDVEN Microbiology Recent Results (from the past 240 hour(s))  MRSA PCR Screening     Status: None   Collection Time: 05/03/16  1:10 AM  Result Value Ref Range Status   MRSA by PCR NEGATIVE NEGATIVE Final    Comment:        The GeneXpert MRSA Assay (FDA approved for NASAL specimens only), is one component of a comprehensive MRSA colonization surveillance program. It is not intended to diagnose MRSA infection nor to guide or monitor treatment for MRSA infections.      Time coordinating discharge: Over 30 minutes  SIGNED:  Pamella Pert, MD  [facility] 05/05/2016, 11:07 AM Pager [phone]  If 7PM-7AM, please contact night-coverage [email or web address] Password [email or web address]  OK to discharge from my standpoint. 05/06/14  Penny Pia

## 2016-05-06 ENCOUNTER — Encounter: Payer: Self-pay | Admitting: *Deleted

## 2016-05-06 ENCOUNTER — Ambulatory Visit: Payer: Medicare HMO | Admitting: Cardiovascular Disease

## 2016-05-06 DIAGNOSIS — N186 End stage renal disease: Secondary | ICD-10-CM

## 2016-05-06 LAB — GLUCOSE, CAPILLARY
GLUCOSE-CAPILLARY: 190 mg/dL — AB (ref 65–99)
Glucose-Capillary: 171 mg/dL — ABNORMAL HIGH (ref 65–99)
Glucose-Capillary: 147 mg/dL — ABNORMAL HIGH (ref 65–99)

## 2016-05-06 MED ORDER — DARBEPOETIN ALFA 100 MCG/0.5ML IJ SOSY: 100.0000 ug | PREFILLED_SYRINGE | INTRAMUSCULAR | Status: DC

## 2016-05-06 NOTE — Clinical Social Work Note (Signed)
Clinical Social Work Assessment  Patient Details  Name: [name] MRN: [id number] Date of Birth: [birth year]  Date of referral:  05/03/16               Reason for consult:  Facility Placement                Permission sought to share information with:  Oceanographer granted to share information::  Yes, Verbal Permission Granted  Name::        Agency::  [facility]  Relationship::     Contact Information:     Housing/Transportation Living arrangements for the past 2 months:  Single Family Home Source of Information:  Patient Patient Interpreter Needed:  None Criminal Activity/Legal Involvement Pertinent to Current Situation/Hospitalization:    Significant Relationships:  Other Family Members (Patient has a nephew, niece and daughter listed as contacts) Lives with:  Self Do you feel safe going back to the place where you live?   (Patient feels safe in his home but is agreeable to ST rehab prior to returning home) Need for family participation in patient care:  No (Coment)  Care giving concerns:  Patient reports that he lives alone.    Social Worker assessment / plan:  CSW talked with patient regarding discharge planning and the recommendation of ST rehab. Patient agreeable to rehab and the facility search process was discussed.  Employment status:  Retired Health and safety inspector:  Systems analyst) PT Recommendations:  Skilled Nursing Facility Information / Referral to community resources:  Other (Comment Required) (Talked with patient regarding facility choices and choice made)  Patient/Family's Response to care:  Patient expressed no concerns regarding care during hospitalization.  Patient/Family's Understanding of and Emotional Response to Diagnosis, Current Treatment, and Prognosis:  Not discussed.  Emotional Assessment Appearance:  Appears stated age Attitude/Demeanor/Rapport:  Other (Appropriate) Affect (typically  observed):  Appropriate Orientation:  Oriented to Self, Oriented to Place, Oriented to  Time, Oriented to Situation Alcohol / Substance use:  Tobacco Use, Alcohol Use, Illicit Drugs (Patient reports that he uses drugs, including cocaine and that he does not smoke or drink alcohol) Psych involvement (Current and /or in the community):  No (Comment)  Discharge Needs  Concerns to be addressed:  Discharge Planning Concerns Readmission within the last 30 days:  No Current discharge risk:  None Barriers to Discharge:  Insurance Authorization   Cristobal Goldmann, LCSW 05/06/2016, 12:54 PM

## 2016-05-06 NOTE — Progress Notes (Signed)
Subjective:  No cos tolerated HD  Yesterday on schedule/ awaiting NHP in [place]/ OP HD [facility] TTS   Objective Vital signs in last 24 hours: Vitals:   05/05/16 1312 05/05/16 1818 05/05/16 2221 05/06/16 0615  BP: 101/71 91/62 98/67  102/66  Pulse: 93  100 99  Resp: (!) 23  16 17   Temp:  98 F (36.7 C) 97.2 F (36.2 C) 97.4 F (36.3 C)  TempSrc:  Oral Oral Oral  SpO2: 100%  97% 99%  Weight:   117.4 kg (258 lb 13.1 oz)   Height:       Weight change: 0.1 kg (3.5 oz)  Physical Exam: General:  Adult AAM, alert OX3 Heart: RRR No rub or mur  Lungs: CTA bilat / nonlabored  Abdomen: BS pos, soft , nontender/ nondistended Extremities: Bipedal edema 2+/ Dialysis Access:  R FA  AVF bruit     OP HD orders: TTS [facility] - 4hr EDW 105 2 K 2.25 Ca 450/800 right upper AVF hectorol 2 heparin 6000 no Mircera or Fe Recent labs: hgb 1.26 9/22 22%sat in August P 7.6 iPTH 474 Ca ok LAST HD was 9/22 for 4 hours - it was his only dialysis last week - prior to that he dialyzed 9/16 for almost 3 hr  :Problem/Plan:  1. ESRD -HD  Now on schedule / had missed several op HD txs/  Op schedule = TTS 2. BP /Volume-5.5 l uf on 25th  to 111.3 kg  / standing wt yest post ?? 117.7  After 3.5 l uf  >edw/still  Has pedal edema / NOTED =the closest he has gotten to edw ( as OP)was 107.8 on 9/16 -had HD  Back to back on admit =serial HD to get volume down; but  With Lethargy resolved  And not  hypoxic  Next HD Thursday on schedule /not on BP meds. Still needs edw eval. As op  3. Weakness- sec to uremia (missed hd in setting of HO CM/ substance abuse  4,Anemia- hgb 10.0 on 25th .  no esa / trending down  / start  Aranesp  next HD With HO CM next tx  5. 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 Gala Romney in the past  8 Cocaine- ongoing issue as op  9Homelessness - no transportation; complicating health care management-  SW to seeking  NHP . 10  Disp - per SW = "insurance at [facility] needs verified / Dc today  Next hd op Thursday at [facility], PA-C [facility] Beeper [phone] 05/06/2016,8:36 AM  LOS: 2 days   Labs: Basic Metabolic Panel:  Recent Labs Lab 05/04/16 0559 05/04/16 0817 05/05/16 0558  NA 130* 129* 129*  K 4.2 4.3 4.4  CL 92* 92* 91*  CO2 22 21* 22  GLUCOSE 157* 144* 164*  BUN 88* 87* 59*  CREATININE 11.33* 11.32* 8.50*  CALCIUM 8.7* 8.8* 8.9  PHOS 6.0* 5.9* 4.8*   Liver Function Tests:  Recent Labs Lab 05/02/16 1832 05/04/16 0559 05/04/16 0817 05/05/16 0558  AST 36  --   --   --   ALT 22  --   --   --   ALKPHOS 72  --   --   --   BILITOT 3.0*  --   --   --   PROT 7.9  --   --   --   ALBUMIN 2.8* 2.6* 2.7*  2.7*   No results for input(s): LIPASE, AMYLASE in the last 168 hours.  Recent Labs Lab 05/02/16 1832  AMMONIA 51*   CBC:  Recent Labs Lab 04/30/16 1324 05/01/16 2316 05/02/16 1832 05/04/16 0816  WBC 5.7 5.9 6.1 5.7  NEUTROABS 3.7 4.2 4.2  --   HGB 12.8* 12.1* 11.8* 10.0*  HCT 38.2* 34.6* 35.7* 29.4*  MCV 91.0 87.8 90.4 88.3  PLT 93* 94* 92* 102*   Cardiac Enzymes: No results for input(s): CKTOTAL, CKMB, CKMBINDEX, TROPONINI in the last 168 hours. CBG:  Recent Labs Lab 05/05/16 0749 05/05/16 1343 05/05/16 1644 05/05/16 2206 05/06/16 0806  GLUCAP 154* 117* 253* 158* 171*    Studies/Results: No results found. Medications:   . amiodarone  200 mg Oral Daily  . aspirin EC  81 mg Oral Daily  . atorvastatin  20 mg Oral QHS  . doxercalciferol  2 mcg Intravenous Q T,Th,Sa-HD  . heparin  5,000 Units Subcutaneous Q8H  . insulin aspart  0-5 Units Subcutaneous QHS  . insulin aspart  0-9 Units Subcutaneous TID WC  . multivitamin  1 tablet Oral QHS  . sevelamer carbonate  1,600 mg Oral TID WC

## 2016-05-06 NOTE — Progress Notes (Addendum)
I assumed care of the patient from Flex RN Vernona Rieger at [phone]. Pt assessed and is stable in no apparent distress. Pt to be transferred to [facility]. Will call report and notify [facility] No[facility] when patient is ready for transport.   Full report given to [employer]. E[facility] Noe notified that patient is ready for transport.

## 2016-05-06 NOTE — Progress Notes (Signed)
PT Cancellation Note  Patient Details Name: [name] MRN: [id number] DOB: 1951/04/17   Cancelled Treatment:    Reason Eval/Treat Not Completed: Other (comment)   Politely declining PT, feeling terrible, requesting we return at a later time,   Will follow up later today as time allows;  Otherwise, will follow up for PT tomorrow;   Thank you,  Van Clines, PT  Acute Rehabilitation Services Pager [phone] Office [phone]     Van Clines [facility] 05/06/2016, 12:02 PM

## 2016-05-06 NOTE — Progress Notes (Signed)
05/06/2016 9:55 AM Hemodialysis Outpatient Note; Mr. Faucette has been accepted at the [facility] on [address]. His schedule will be Tuesday,Thursday and Saturday 2nd shift. The center will anticipate Mr Duncanson tomorrow at 11:30AM. Thank you. Cleotis Nipper.

## 2016-05-06 NOTE — Plan of Care (Signed)
Problem: Education: Goal: Knowledge of [facility] Education information/materials will improve Outcome: Progressing POC reviewed with pt.   

## 2016-05-06 NOTE — Clinical Social Work Placement (Addendum)
   CLINICAL SOCIAL WORK PLACEMENT  NOTE 05/06/16 - DISCHARGED TO [facility]  Date:  05/06/2016  Patient Details  Name: [name] MRN: [id number] Date of Birth: 23-Jun-1951  Clinical Social Work is seeking post-discharge placement for this patient at the Skilled  Nursing Facility level of care (*CSW will initial, date and re-position this form in  chart as items are completed):  No   Patient/family provided with [facility] Clinical Social Work Department's list of facilities offering this level of care within the geographic area requested by the patient (or if unable, by the patient's family).  Yes   Patient/family informed of their freedom to choose among providers that offer the needed level of care, that participate in Medicare, Medicaid or managed care program needed by the patient, have an available bed and are willing to accept the patient.  No   Patient/family informed of [facility]'s ownership interest in [facility] and [facility], as well as of the fact that they are under no obligation to receive care at these facilities.  PASRR submitted to EDS on       PASRR number received on       Existing PASRR number confirmed on 05/05/16     FL2 transmitted to all facilities in geographic area requested by pt/family on 05/04/16     FL2 transmitted to all facilities within larger geographic area on       Patient informed that his/her managed care company has contracts with or will negotiate with certain facilities, including the following:        Yes (05/05/16)   Patient/family informed of bed offers received.  Patient chooses bed at [facility]     Physician recommends and patient chooses bed at      Patient to be transferred to [facility] on  05/06/16.  Patient to be transferred to facility by   ambulance    Patient family notified on  05/06/16 of transfer.  Name of family member notified:   Toni Amend - [phone] by  phone.    PHYSICIAN       Additional Comment:    _______________________________________________ Cristobal Goldmann, LCSW 05/06/2016, 12:59 PM

## 2016-05-22 ENCOUNTER — Inpatient Hospital Stay
Admission: EM | Admit: 2016-05-22 | Discharge: 2016-05-23 | DRG: 291 | Disposition: A | Discharge status: Skilled Nursing Facility | Payer: MEDICARE | Attending: Internal Medicine | Admitting: Internal Medicine

## 2016-05-22 ENCOUNTER — Emergency Department: Payer: MEDICARE

## 2016-05-22 ENCOUNTER — Encounter: Payer: Self-pay | Admitting: Emergency Medicine

## 2016-05-22 DIAGNOSIS — Z9115 Patient's noncompliance with renal dialysis: Secondary | ICD-10-CM | POA: Diagnosis not present

## 2016-05-22 DIAGNOSIS — I513 Intracardiac thrombosis, not elsewhere classified: Secondary | ICD-10-CM | POA: Diagnosis not present

## 2016-05-22 DIAGNOSIS — Z794 Long term (current) use of insulin: Secondary | ICD-10-CM | POA: Diagnosis not present

## 2016-05-22 DIAGNOSIS — E871 Hypo-osmolality and hyponatremia: Secondary | ICD-10-CM | POA: Diagnosis present

## 2016-05-22 DIAGNOSIS — E861 Hypovolemia: Secondary | ICD-10-CM | POA: Diagnosis present

## 2016-05-22 DIAGNOSIS — R0602 Shortness of breath: Secondary | ICD-10-CM | POA: Diagnosis present

## 2016-05-22 DIAGNOSIS — I132 Hypertensive heart and chronic kidney disease with heart failure and with stage 5 chronic kidney disease, or end stage renal disease: Secondary | ICD-10-CM | POA: Diagnosis not present

## 2016-05-22 DIAGNOSIS — J9601 Acute respiratory failure with hypoxia: Secondary | ICD-10-CM | POA: Diagnosis present

## 2016-05-22 DIAGNOSIS — I429 Cardiomyopathy, unspecified: Secondary | ICD-10-CM | POA: Diagnosis present

## 2016-05-22 DIAGNOSIS — I482 Chronic atrial fibrillation: Secondary | ICD-10-CM | POA: Diagnosis present

## 2016-05-22 DIAGNOSIS — E8809 Other disorders of plasma-protein metabolism, not elsewhere classified: Secondary | ICD-10-CM | POA: Diagnosis not present

## 2016-05-22 DIAGNOSIS — N2581 Secondary hyperparathyroidism of renal origin: Secondary | ICD-10-CM | POA: Diagnosis not present

## 2016-05-22 DIAGNOSIS — Z7982 Long term (current) use of aspirin: Secondary | ICD-10-CM

## 2016-05-22 DIAGNOSIS — E1122 Type 2 diabetes mellitus with diabetic chronic kidney disease: Secondary | ICD-10-CM | POA: Diagnosis present

## 2016-05-22 DIAGNOSIS — I081 Rheumatic disorders of both mitral and tricuspid valves: Secondary | ICD-10-CM | POA: Diagnosis present

## 2016-05-22 DIAGNOSIS — I5023 Acute on chronic systolic (congestive) heart failure: Secondary | ICD-10-CM | POA: Diagnosis present

## 2016-05-22 DIAGNOSIS — R0609 Other forms of dyspnea: Secondary | ICD-10-CM

## 2016-05-22 DIAGNOSIS — Z79899 Other long term (current) drug therapy: Secondary | ICD-10-CM

## 2016-05-22 DIAGNOSIS — F1410 Cocaine abuse, uncomplicated: Secondary | ICD-10-CM | POA: Diagnosis present

## 2016-05-22 DIAGNOSIS — E785 Hyperlipidemia, unspecified: Secondary | ICD-10-CM | POA: Diagnosis not present

## 2016-05-22 DIAGNOSIS — N186 End stage renal disease: Secondary | ICD-10-CM | POA: Diagnosis not present

## 2016-05-22 DIAGNOSIS — D631 Anemia in chronic kidney disease: Secondary | ICD-10-CM | POA: Diagnosis present

## 2016-05-22 DIAGNOSIS — Z992 Dependence on renal dialysis: Secondary | ICD-10-CM

## 2016-05-22 DIAGNOSIS — E8779 Other fluid overload: Secondary | ICD-10-CM | POA: Diagnosis not present

## 2016-05-22 LAB — COMPREHENSIVE METABOLIC PANEL
ALBUMIN: 2.9 g/dL — AB (ref 3.5–5.0)
ALK PHOS: 97 U/L (ref 38–126)
ALT: 17 U/L (ref 17–63)
ALT: 18 U/L (ref 17–63)
ANION GAP: 12 (ref 5–15)
AST: 37 U/L (ref 15–41)
AST: 36 U/L (ref 15–41)
Albumin: 2.9 g/dL — ABNORMAL LOW (ref 3.5–5.0)
Alkaline Phosphatase: 99 U/L (ref 38–126)
Anion gap: 11 (ref 5–15)
BUN: 32 mg/dL — AB (ref 6–20)
BUN: 32 mg/dL — AB (ref 6–20)
CALCIUM: 8.7 mg/dL — AB (ref 8.9–10.3)
CHLORIDE: 89 mmol/L — AB (ref 101–111)
CHLORIDE: 89 mmol/L — AB (ref 101–111)
CO2: 30 mmol/L (ref 22–32)
CO2: 32 mmol/L (ref 22–32)
CREATININE: 6.08 mg/dL — AB (ref 0.61–1.24)
Calcium: 8.7 mg/dL — ABNORMAL LOW (ref 8.9–10.3)
Creatinine, Ser: 5.98 mg/dL — ABNORMAL HIGH (ref 0.61–1.24)
GFR calc Af Amer: 10 mL/min — ABNORMAL LOW (ref >60)
GFR calc non Af Amer: 9 mL/min — ABNORMAL LOW (ref >60)
GFR, EST AFRICAN AMERICAN: 10 mL/min — AB (ref >60)
GFR, EST NON AFRICAN AMERICAN: 9 mL/min — AB (ref >60)
GLUCOSE: 83 mg/dL (ref 65–99)
Glucose, Bld: 107 mg/dL — ABNORMAL HIGH (ref 65–99)
POTASSIUM: 4.6 mmol/L (ref 3.5–5.1)
Potassium: 4.2 mmol/L (ref 3.5–5.1)
SODIUM: 132 mmol/L — AB (ref 135–145)
Sodium: 131 mmol/L — ABNORMAL LOW (ref 135–145)
Total Bilirubin: 3.7 mg/dL — ABNORMAL HIGH (ref 0.3–1.2)
Total Bilirubin: 3.5 mg/dL — ABNORMAL HIGH (ref 0.3–1.2)
Total Protein: 8.4 g/dL — ABNORMAL HIGH (ref 6.5–8.1)
Total Protein: 8.3 g/dL — ABNORMAL HIGH (ref 6.5–8.1)

## 2016-05-22 LAB — CBC WITH DIFFERENTIAL/PLATELET
BASOS ABS: 0.1 10*3/uL (ref 0–0.1)
BASOS PCT: 1 %
EOS PCT: 1 %
Eosinophils Absolute: 0 10*3/uL (ref 0–0.7)
HCT: 34.7 % — ABNORMAL LOW (ref 40.0–52.0)
Hemoglobin: 11.8 g/dL — ABNORMAL LOW (ref 13.0–18.0)
LYMPHS PCT: 9 %
Lymphs Abs: 0.6 10*3/uL — ABNORMAL LOW (ref 1.0–3.6)
MCH: 31.4 pg (ref 26.0–34.0)
MCHC: 33.9 g/dL (ref 32.0–36.0)
MCV: 92.4 fL (ref 80.0–100.0)
MONO ABS: 1.6 10*3/uL — AB (ref 0.2–1.0)
Monocytes Relative: 24 %
Neutro Abs: 4.6 10*3/uL (ref 1.4–6.5)
Neutrophils Relative %: 65 %
PLATELETS: 132 10*3/uL — AB (ref 150–440)
RBC: 3.75 MIL/uL — ABNORMAL LOW (ref 4.40–5.90)
RDW: 16.8 % — AB (ref 11.5–14.5)
WBC: 7 10*3/uL (ref 3.8–10.6)

## 2016-05-22 LAB — PROTIME-INR
INR: 1.49
PROTHROMBIN TIME: 18.2 s — AB (ref 11.4–15.2)

## 2016-05-22 LAB — MAGNESIUM
MAGNESIUM: 2.2 mg/dL (ref 1.7–2.4)
Magnesium: 2.2 mg/dL (ref 1.7–2.4)

## 2016-05-22 LAB — TROPONIN I

## 2016-05-22 LAB — BRAIN NATRIURETIC PEPTIDE: B Natriuretic Peptide: 2238 pg/mL — ABNORMAL HIGH (ref 0.0–100.0)

## 2016-05-22 LAB — GLUCOSE, CAPILLARY
GLUCOSE-CAPILLARY: 78 mg/dL (ref 65–99)
GLUCOSE-CAPILLARY: 106 mg/dL — AB (ref 65–99)
GLUCOSE-CAPILLARY: 114 mg/dL — AB (ref 65–99)
Glucose-Capillary: 110 mg/dL — ABNORMAL HIGH (ref 65–99)

## 2016-05-22 LAB — MRSA PCR SCREENING: MRSA by PCR: NEGATIVE

## 2016-05-22 LAB — APTT: APTT: 40 s — AB (ref 24–36)

## 2016-05-22 LAB — PHOSPHORUS: Phosphorus: 4 mg/dL (ref 2.5–4.6)

## 2016-05-22 MED ORDER — INSULIN ASPART 100 UNIT/ML ~~LOC~~ SOLN
2.0000 [IU] | Freq: Three times a day (TID) | SUBCUTANEOUS | Status: DC
  Administered 2016-05-22: 2 [IU] via SUBCUTANEOUS
  Filled 2016-05-22: qty 2

## 2016-05-22 MED ORDER — ASPIRIN 300 MG RE SUPP: 300.0000 mg | RECTAL | Status: DC

## 2016-05-22 MED ORDER — AMIODARONE HCL 200 MG PO TABS
200.0000 mg | ORAL_TABLET | Freq: Every day | ORAL | Status: DC
  Administered 2016-05-22 – 2016-05-23 (×2): 200 mg via ORAL
  Filled 2016-05-22 (×2): qty 1

## 2016-05-22 MED ORDER — HEPARIN SODIUM (PORCINE) 5000 UNIT/ML IJ SOLN
5000.0000 [IU] | Freq: Three times a day (TID) | INTRAMUSCULAR | Status: DC
  Administered 2016-05-22 – 2016-05-23 (×4): 5000 [IU] via SUBCUTANEOUS
  Filled 2016-05-22 (×4): qty 1

## 2016-05-22 MED ORDER — ALBUMIN HUMAN 25 % IV SOLN
25.0000 g | Freq: Once | INTRAVENOUS | Status: AC
  Administered 2016-05-22: 25 g via INTRAVENOUS
  Filled 2016-05-22: qty 100

## 2016-05-22 MED ORDER — INSULIN DETEMIR 100 UNIT/ML ~~LOC~~ SOLN
12.0000 [IU] | Freq: Every day | SUBCUTANEOUS | Status: DC
  Administered 2016-05-22: 12 [IU] via SUBCUTANEOUS
  Filled 2016-05-22 (×2): qty 0.12

## 2016-05-22 MED ORDER — SEVELAMER CARBONATE 800 MG PO TABS
2400.0000 mg | ORAL_TABLET | Freq: Three times a day (TID) | ORAL | Status: DC
  Administered 2016-05-22 – 2016-05-23 (×3): 2400 mg via ORAL
  Filled 2016-05-22 (×3): qty 3

## 2016-05-22 MED ORDER — ATORVASTATIN CALCIUM 20 MG PO TABS
20.0000 mg | ORAL_TABLET | Freq: Every day | ORAL | Status: DC
  Administered 2016-05-22: 20 mg via ORAL
  Filled 2016-05-22: qty 1

## 2016-05-22 MED ORDER — MIDODRINE HCL 5 MG PO TABS
10.0000 mg | ORAL_TABLET | Freq: Every day | ORAL | Status: DC | PRN
Start: 2016-05-22 — End: 2016-05-23
  Administered 2016-05-22 – 2016-05-23 (×2): 10 mg via ORAL
  Filled 2016-05-22 (×2): qty 2

## 2016-05-22 MED ORDER — ORAL CARE MOUTH RINSE: 15.0000 mL | Freq: Two times a day (BID) | OROMUCOSAL | Status: DC

## 2016-05-22 MED ORDER — ASPIRIN 81 MG PO CHEW
324.0000 mg | CHEWABLE_TABLET | ORAL | Status: DC
  Filled 2016-05-22: qty 4

## 2016-05-22 MED ORDER — ASPIRIN EC 81 MG PO TBEC
81.0000 mg | DELAYED_RELEASE_TABLET | Freq: Every day | ORAL | Status: DC
  Administered 2016-05-22 – 2016-05-23 (×2): 81 mg via ORAL
  Filled 2016-05-22 (×2): qty 1

## 2016-05-22 MED ORDER — ENOXAPARIN SODIUM 40 MG/0.4ML ~~LOC~~ SOLN: 30.0000 mg | SUBCUTANEOUS | Status: DC

## 2016-05-22 MED ORDER — IPRATROPIUM-ALBUTEROL 0.5-2.5 (3) MG/3ML IN SOLN: 3.0000 mL | RESPIRATORY_TRACT | Status: DC | PRN

## 2016-05-22 MED ORDER — CHLORHEXIDINE GLUCONATE 0.12 % MT SOLN
15.0000 mL | Freq: Two times a day (BID) | OROMUCOSAL | Status: DC
  Administered 2016-05-22 (×2): 15 mL via OROMUCOSAL
  Filled 2016-05-22 (×3): qty 15

## 2016-05-22 MED ORDER — INSULIN ASPART 100 UNIT/ML ~~LOC~~ SOLN: 2.0000 [IU] | SUBCUTANEOUS | Status: DC

## 2016-05-22 NOTE — Progress Notes (Signed)
V-60 BIPAP taken to patient room. Patient resting on 4L [place] in no acute distress, refused BIPAP use this shift stating he will try tomorrow.

## 2016-05-22 NOTE — Progress Notes (Signed)
Dialysis complete

## 2016-05-22 NOTE — Progress Notes (Signed)
CSW is aware that patient is from [facility]). Left voicemail for [place]- Admissions at [facility]. Awaiting phone call back.  Woodroe Mode, MSW, LCSW, LCAS-A Clinical Social Worker [phone]

## 2016-05-22 NOTE — ED Provider Notes (Signed)
[facility] Emergency Department Provider Note   ____________________________________________   First MD Initiated Contact with Patient 05/22/16 0131     (approximate)  I have reviewed the triage vital signs and the nursing notes.   HISTORY  Chief Complaint Respiratory Distress  Caveat-history of present illness and review of systems is limited due to the patient's respiratory distress.  HPI [name] is a 65 y.o. male with history of ESRD on HD T/Th/Sat, paroxysmal atrial fibrillation (CHADS-Vasc score of at least 3, presumably not anticoagulated due to history of polysubstance abuse, on amiodarone followed by Dr. Teressa Lower), diabetes, and chronic systolic heart failure presents for evaluation of cough and shortness of breath this evening, gradual onset, constant, severe. Patient reports that he had been dialyzed in over a week "because no one came to pick me up". He did attend dialysis yesterday and was fully dialyzed. He denies chest pain. No vomiting, diarrhea, fevers or chills. He does feel as if he is retaining fluid. Per EMS, he was hypoxic on arrival with an O2 saturation in the low 80s which improved with nonrebreather.   Past Medical History:  Diagnosis Date  . Anemia   . Apical mural thrombus   . Arthritis   . Atrial fibrillation, chronic (HCC)   . Chronic systolic heart failure (HCC)    a) ECHO [facility] 02/2014): EF <15%, multiple apical thrombi, RV mod enlarged, mod MR b. RHC (02/21/14) at [facility]: RA 16, RV 64/9 (15), PA 64/32 (42), PCWP: 24, CI: 1.4  . CKD (chronic kidney disease), stage III   . Diabetes mellitus without complication (HCC)   . Drug use    cocaine  . HLD (hyperlipidemia)   . HTN (hypertension)   . Intrahepatic cholestasis 02/20/2016  . Mitral regurgitation   . Pneumonia     Patient Active Problem List   Diagnosis Date Noted  . ESRD (end stage renal disease) on dialysis (HCC) 05/03/2016  . Generalized weakness  05/02/2016  . Intrahepatic cholestasis 02/20/2016  . Direct hyperbilirubinemia   . Paroxysmal atrial fibrillation (HCC)   . Shortness of breath   . Type 2 diabetes mellitus with diabetic nephropathy, without long-term current use of insulin (HCC)   . End stage renal disease (HCC) 12/25/2014  . Atrial fibrillation [I48.91] 08/09/2014  . Encounter for therapeutic drug monitoring 08/09/2014  . Chronic systolic heart failure (HCC) 07/04/2014  . ESRD (end stage renal disease) (HCC) 07/04/2014  . PAF (paroxysmal atrial fibrillation) (HCC) 07/04/2014  . Controlled diabetes mellitus type II without complication (HCC) 06/22/2014  . Gout 06/22/2014  . ESRD needing dialysis (HCC)   . Pulmonary edema   . Bacteremia associated with intravascular line (HCC)   . Blood poisoning (HCC)   . Acute respiratory failure with hypoxia (HCC)   . AKI (acute kidney injury) (HCC)   . Screen for STD (sexually transmitted disease)   . Acute respiratory failure (HCC) 06/08/2014  . Cardiogenic shock (HCC) 06/08/2014  . Acute on chronic systolic congestive heart failure (HCC) 06/08/2014  . Acute on chronic renal failure (HCC) 06/08/2014  . Hyperkalemia 06/08/2014  . Respiratory failure (HCC) 06/08/2014    Past Surgical History:  Procedure Laterality Date  . AV FISTULA PLACEMENT Right 06/27/2014   Procedure: ARTERIOVENOUS (AV) FISTULA CREATION VERSUS GRAFT INSERTION;  Surgeon: Pryor Ochoa, MD;  Location: [facility] OR;  Service: Vascular;  Laterality: Right;  . COLONOSCOPY    . FISTULA SUPERFICIALIZATION Right 10/25/2014   Procedure: RIGHT ARM RADIOCEPHALIC FISTULA SUPERFICIALIZATION;  Surgeon: Pryor OchoaJames D Lawson, MD;  Location: [facility] OR;  Service: Vascular;  Laterality: Right;  . INSERTION OF DIALYSIS CATHETER  12/15    Prior to Admission medications   Medication Sig Start Date End Date Taking? Authorizing Provider  amiodarone (PACERONE) 200 MG tablet Take 1 tablet (200 mg total) by mouth daily. 08/23/14  Yes Amy D Clegg,  NP  aspirin EC 81 MG EC tablet Take 1 tablet (81 mg total) by mouth daily. 05/05/16  Yes Costin Otelia SergeantM Gherghe, MD  atorvastatin (LIPITOR) 20 MG tablet Take 20 mg by mouth at bedtime.   Yes Historical Provider, MD  hydrocortisone cream 1 % Apply 1 application topically 3 (three) times daily.   Yes Historical Provider, MD  insulin NPH Human (HUMULIN N,NOVOLIN N) 100 UNIT/ML injection Inject 0.12 mLs (12 Units total) into the skin at bedtime. 09/13/14  Yes Margarita Grizzleanielle Ray, MD  insulin regular (NOVOLIN R,HUMULIN R) 100 units/mL injection Inject 0.02 mLs (2 Units total) into the skin 3 (three) times daily before meals. 05/05/16  Yes Costin Otelia SergeantM Gherghe, MD  sevelamer carbonate (RENVELA) 800 MG tablet Take 3 tablets (2,400 mg total) by mouth 3 (three) times daily with meals. 02/22/16  Yes Almon Herculesaye T Gonfa, MD    Allergies Review of patient's allergies indicates no known allergies.  History reviewed. No pertinent family history.  Social History Social History  Substance Use Topics  . Smoking status: Never Smoker  . Smokeless tobacco: Never Used  . Alcohol use No     Comment: last usuage 1 yr. ago    Review of Systems  Respiratory: + shortness of breath.   Caveat-history of present illness and review of systems is limited due to the patient's respiratory distress. ____________________________________________   PHYSICAL EXAM:  Vitals:   05/22/16 0129 05/22/16 0131 05/22/16 0138 05/22/16 0145  BP: 95/75   90/72  Pulse: 96   92  Resp: 17   17  Temp: 97.9 F (36.6 C)     TempSrc: Axillary     SpO2: 100%  100% 100%  Weight:  258 lb (117 kg)    Height:  6' (1.829 m)      VITAL SIGNS: ED Triage Vitals  Enc Vitals Group     BP 05/22/16 0129 95/75     Pulse Rate 05/22/16 0129 96     Resp 05/22/16 0129 17     Temp --      Temp src --      SpO2 05/22/16 0125 92 %     Weight 05/22/16 0131 258 lb (117 kg)     Height 05/22/16 0131 6' (1.829 m)     Head Circumference --      Peak Flow --      Pain  Score --      Pain Loc --      Pain Edu? --      Excl. in GC? --     Constitutional: Alert and oriented.In moderate to severe respiratory distress able to speak only in short instances. Eyes: Conjunctivae are normal. PERRL. EOMI. Head: Atraumatic. Nose: No congestion/rhinnorhea. Mouth/Throat: Mucous membranes are moist.  Oropharynx non-erythematous. Neck: No stridor. Supple without meningismus. Cardiovascular: Normal rate, regular rhythm. Grossly normal heart sounds.  Good peripheral circulation. Respiratory: Tachypnea with increased work of breathing, diminished breath sounds with crackles in the bases. Gastrointestinal: Soft and nontender. No distention.  No CVA tenderness. Genitourinary: deferred Musculoskeletal: 3+ pitting edema bilateral lower extremities with weeping small blisters. Neurologic:  Normal speech  and language. No gross focal neurologic deficits are appreciated. Skin:  Skin is warm, dry and intact. No rash noted. Psychiatric: Mood and affect are normal. Speech and behavior are normal.  ____________________________________________   LABS (all labs ordered are listed, but only abnormal results are displayed)  Labs Reviewed  CBC WITH DIFFERENTIAL/PLATELET - Abnormal; Notable for the following:       Result Value   RBC 3.75 (*)    Hemoglobin 11.8 (*)    HCT 34.7 (*)    RDW 16.8 (*)    Platelets 132 (*)    Lymphs Abs 0.6 (*)    Monocytes Absolute 1.6 (*)    All other components within normal limits  COMPREHENSIVE METABOLIC PANEL - Abnormal; Notable for the following:    Sodium 131 (*)    Chloride 89 (*)    Glucose, Bld 107 (*)    BUN 32 (*)    Creatinine, Ser 5.98 (*)    Calcium 8.7 (*)    Total Protein 8.4 (*)    Albumin 2.9 (*)    Total Bilirubin 3.7 (*)    GFR calc non Af Amer 9 (*)    GFR calc Af Amer 10 (*)    All other components within normal limits  PROTIME-INR - Abnormal; Notable for the following:    Prothrombin Time 18.2 (*)    All other  components within normal limits  APTT - Abnormal; Notable for the following:    aPTT 40 (*)    All other components within normal limits  TROPONIN I  MAGNESIUM  BRAIN NATRIURETIC PEPTIDE  COMPREHENSIVE METABOLIC PANEL  MAGNESIUM  PHOSPHORUS   ____________________________________________  EKG  ED ECG REPORT I, Gayla Doss, the attending physician, personally viewed and interpreted this ECG.   Date: 05/22/2016  EKG Time: 01:30  Rate: 96  Rhythm: normal sinus rhythm  Axis: normal  Intervals: Nonspecific intraventricular conduction delay  ST&T Change: No acute ST elevation or acute ST depression.  ____________________________________________  RADIOLOGY  CXR IMPRESSION: Progressive cardiomegaly with vascular congestion. Questionable pleural effusions. ____________________________________________   PROCEDURES  Procedure(s) performed: None  Procedures  Critical Care performed: Yes, see critical care note(s)   CRITICAL CARE Performed by: Toney Rakes A   Total critical care time: 35 minutes  Critical care time was exclusive of separately billable procedures and treating other patients.  Critical care was necessary to treat or prevent imminent or life-threatening deterioration.  Critical care was time spent personally by me on the following activities: development of treatment plan with patient and/or surrogate as well as nursing, discussions with consultants, evaluation of patient's response to treatment, examination of patient, obtaining history from patient or surrogate, ordering and performing treatments and interventions, ordering and review of laboratory studies, ordering and review of radiographic studies, pulse oximetry and re-evaluation of patient's condition.  ____________________________________________   INITIAL IMPRESSION / ASSESSMENT AND PLAN / ED COURSE  Pertinent labs & imaging results that were available during my care of the patient were  reviewed by me and considered in my medical decision making (see chart for details).  [name] is a 65 y.o. male with history of ESRD on HD T/Th/Sat, paroxysmal atrial fibrillation (CHADS-Vasc score of at least 3, presumably not anticoagulated due to history of polysubstance abuse, on amiodarone followed by Dr. Teressa Lower), diabetes, and chronic systolic heart failure presents for evaluation of cough and shortness of breath this evening. On arrival to the emergency department he is in moderate to severe respiratory distress. BiPAP  therapy initiated immediately and his respiratory status is improving. Vital signs are notable for mild hypotension however he is maintaining MAPs greater than 60 on BiPAP. Suspect acute volume overload in the setting of under diuresis and under dialysis. He grossly appears volume overloaded with severe bilateral lower extremity edema. We'll obtain screening labs, chest x-ray and anticipate admission.  ----------------------------------------- 2:56 AM on 05/22/2016 ----------------------------------------- Patient continues to appear very comfortable on BiPAP therapy, now able to speak over the BiPAP. Chest x-ray shows vascular congestion as well as likely pleural effusions bilaterally. Labs reviewed. CMP shows mild hyponatremia, creatinine elevation as expected in the setting of end-stage renal disease, normal potassium. CBC with mild chronic anemia, hemoglobin 11.8. Negative troponin. BNP is pending. I discussed the case with Dr. Kathrin Penner of the ICU and the patient will be admitted to the ICU. I also discussed the case with the ICU nurse practitioner, [place]. I discussed the case with Dr. Thedore Mins of nephrology who will arrange for  patient to have dialysis later this morning.   Clinical Course     ____________________________________________   FINAL CLINICAL IMPRESSION(S) / ED DIAGNOSES  Final diagnoses:  Acute respiratory failure with hypoxia (HCC)  Acute on  chronic systolic congestive heart failure (HCC)      NEW MEDICATIONS STARTED DURING THIS VISIT:  New Prescriptions   No medications on file     Note:  This document was prepared using Dragon voice recognition software and may include unintentional dictation errors.    Gayla Doss, MD 05/22/16 [phone]

## 2016-05-22 NOTE — Progress Notes (Signed)
Pre Dialysis 

## 2016-05-22 NOTE — Progress Notes (Signed)
Dialysis started 

## 2016-05-22 NOTE — Consult Note (Signed)
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  Patient states he Is from [place]. He moved to this area 3 months ago to be closer to his mother who is in a nursing home. He normally Dialyzes at [facility] with [facility].  He was hospitalized at [facility] then discharged to [facility]. There seems to be some confusion about transportation to his dialysis center - [facility] [address]. Last HD was wedesday according to patient.  He presented Thursday night for acute SOB. Felt better with [facility] Nephrology consult is requested for HD   Medications: Outpatient medications: Prescriptions Prior to Admission  Medication Sig Dispense Refill Last Dose  . amiodarone (PACERONE) 200 MG tablet Take 1 tablet (200 mg total) by mouth daily. 30 tablet 6 Not Taking at Unknown time  . aspirin EC 81 MG EC tablet Take 1 tablet (81 mg total) by mouth daily.     Marland Kitchen atorvastatin (LIPITOR) 20 MG tablet Take 20 mg by mouth at bedtime.   Not Taking at Unknown time  . hydrocortisone cream 1 % Apply 1 application topically 3 (three) times daily.     . insulin NPH Human (HUMULIN N,NOVOLIN N) 100 UNIT/ML injection Inject 0.12 mLs (12 Units total) into the skin at bedtime. 10 mL 1 Not Taking at Unknown time  . insulin regular (NOVOLIN R,HUMULIN R) 100 units/mL injection Inject  0.02 mLs (2 Units total) into the skin 3 (three) times daily before meals. 10 mL 1   . sevelamer carbonate (RENVELA) 800 MG tablet Take 3 tablets (2,400 mg total) by mouth 3 (three) times daily with meals. 90 tablet 0 Not Taking at Unknown time    Current medications: Current Facility-Administered Medications  Medication Dose Route Frequency Provider Last Rate Last Dose  . amiodarone (PACERONE) tablet 200 mg  200 mg Oral Daily Bincy S Varughese, NP   200 mg at 05/22/16 0840  . aspirin EC tablet 81 mg  81 mg Oral Daily Bincy S Varughese, NP   81 mg at 05/22/16 0840  . atorvastatin (LIPITOR) tablet 20 mg  20 mg Oral q1800 Bincy S Varughese, NP      . chlorhexidine (PERIDEX) 0.12 % solution 15 mL  15 mL Mouth Rinse BID Bincy S Varughese, NP   15 mL at 05/22/16 0414  . heparin injection 5,000 Units  5,000 Units Subcutaneous Q8H Bincy S Varughese, NP   5,000 Units at 05/22/16 0502  . insulin aspart (novoLOG) injection 2 Units  2 Units Subcutaneous TID AC Bincy S Varughese, NP      .  insulin aspart (novoLOG) injection 2-6 Units  2-6 Units Subcutaneous Q4H Bincy S Varughese, NP      . insulin detemir (LEVEMIR) injection 12 Units  12 Units Subcutaneous QHS Bincy S Varughese, NP      . ipratropium-albuterol (DUONEB) 0.5-2.5 (3) MG/3ML nebulizer solution 3 mL  3 mL Nebulization Q4H PRN Vishal Mungal, MD      . MEDLINE mouth rinse  15 mL Mouth Rinse q12n4p Bincy S Varughese, NP      . midodrine (PROAMATINE) tablet 10 mg  10 mg Oral Daily PRN Mosetta Pigeon, MD      . sevelamer carbonate (RENVELA) tablet 2,400 mg  2,400 mg Oral TID WC Bincy S Varughese, NP   2,400 mg at 05/22/16 0840      Allergies: No Known Allergies    Past Medical History: Past Medical History:  Diagnosis Date  . Anemia   . Apical mural thrombus   . Arthritis   . Atrial fibrillation, chronic (HCC)   . Chronic systolic heart failure (HCC)    a) ECHO [facility] 02/2014): EF <15%, multiple apical thrombi, RV mod enlarged, mod MR b.  RHC (02/21/14) at [facility]: RA 16, RV 64/9 (15), PA 64/32 (42), PCWP: 24, CI: 1.4  . CKD (chronic kidney disease), stage III   . Diabetes mellitus without complication (HCC)   . Drug use    cocaine  . HLD (hyperlipidemia)   . HTN (hypertension)   . Intrahepatic cholestasis 02/20/2016  . Mitral regurgitation   . Pneumonia      Past Surgical History: Past Surgical History:  Procedure Laterality Date  . AV FISTULA PLACEMENT Right 06/27/2014   Procedure: ARTERIOVENOUS (AV) FISTULA CREATION VERSUS GRAFT INSERTION;  Surgeon: Pryor Ochoa, MD;  Location: [facility] OR;  Service: Vascular;  Laterality: Right;  . COLONOSCOPY    . FISTULA SUPERFICIALIZATION Right 10/25/2014   Procedure: RIGHT ARM RADIOCEPHALIC FISTULA SUPERFICIALIZATION;  Surgeon: Pryor Ochoa, MD;  Location: [facility] OR;  Service: Vascular;  Laterality: Right;  . INSERTION OF DIALYSIS CATHETER  12/15     Family History: History reviewed. No pertinent family history.   Social History: Social History   Social History  . Marital status: Divorced    Spouse name: N/A  . Number of children: N/A  . Years of education: N/A   Occupational History  . Not on file.   Social History Main Topics  . Smoking status: Never Smoker  . Smokeless tobacco: Never Used  . Alcohol use No     Comment: last usuage 1 yr. ago  . Drug use:     Types: Cocaine     Comment: hx cocaine use  as of 1 yr. ago  . Sexual activity: Not on file   Other Topics Concern  . Not on file   Social History Narrative   Respiratory therapist     Review of Systems: Gen: no fever, chills HEENT: denies acute c/o CV: end stage CMP Resp: shortness of breath; required BiPAP GI: appetite is good GU :  anuric MS: states he is able to ambulate prior to admission Derm:  Dry skin Psych: no c/o Heme:  No c/o  Neuro: no c/o  Endocrine no c/o   Vital Signs: Blood pressure 111/82, pulse (!) 118, temperature 98.1 F (36.7 C), temperature source Axillary, resp. rate  (!) 26, height 6' (1.829 m), weight 129.3 kg (285 lb 0.9 oz), SpO2 97 %.  No intake or output data in the 24 hours ending 05/22/16 0933  Weight trends: Filed Weights   05/22/16 0131 05/22/16 0357  Weight: 117 kg (258 lb) 129.3 kg (285 lb 0.9 oz)    Physical Exam: General:  No acute distress;   HEENT Moist oral mucus membranes  Neck:  supple  Lungs: Crackles b/l  Heart::  tachyardic  Abdomen: Soft, NT  Extremities:  +++ dependent edema  Neurologic: Alert, orieinted  Skin: Dry skin  Access Rt forearm avf          Lab results: Basic Metabolic Panel:  Recent Labs Lab 05/22/16 0130 05/22/16 0132 05/22/16 0447  NA  --  131* 132*  K  --  4.6 4.2  CL  --  89* 89*  CO2  --  30 32  GLUCOSE  --  107* 83  BUN  --  32* 32*  CREATININE  --  5.98* 6.08*  CALCIUM  --  8.7* 8.7*  MG 2.2  --  2.2  PHOS  --   --  4.0    Liver Function Tests:  Recent Labs Lab 05/22/16 0447  AST 36  ALT 18  ALKPHOS 97  BILITOT 3.5*  PROT 8.3*  ALBUMIN 2.9*   No results for input(s): LIPASE, AMYLASE in the last 168 hours. No results for input(s): AMMONIA in the last 168 hours.  CBC:  Recent Labs Lab 05/22/16 0132  WBC 7.0  NEUTROABS 4.6  HGB 11.8*  HCT 34.7*  MCV 92.4  PLT 132*    Cardiac Enzymes:  Recent Labs Lab 05/22/16 0132  TROPONINI <0.03    BNP: Invalid input(s): POCBNP  CBG:  Recent Labs Lab 05/22/16 0351 05/22/16 0731  GLUCAP 78 106*    Microbiology: Recent Results (from the past 720 hour(s))  MRSA PCR Screening     Status: None   Collection Time: 05/03/16  1:10 AM  Result Value Ref Range Status   MRSA by PCR NEGATIVE NEGATIVE Final    Comment:        The GeneXpert MRSA Assay (FDA approved for NASAL specimens only), is one component of a comprehensive MRSA colonization surveillance program. It is not intended to diagnose MRSA infection nor to guide or monitor treatment for MRSA infections.   MRSA PCR Screening     Status: None    Collection Time: 05/22/16  3:48 AM  Result Value Ref Range Status   MRSA by PCR NEGATIVE NEGATIVE Final    Comment:        The GeneXpert MRSA Assay (FDA approved for NASAL specimens only), is one component of a comprehensive MRSA colonization surveillance program. It is not intended to diagnose MRSA infection nor to guide or monitor treatment for MRSA infections.      Coagulation Studies:  Recent Labs  05/22/16 0130  LABPROT 18.2*  INR 1.49    Urinalysis: No results for input(s): COLORURINE, LABSPEC, PHURINE, GLUCOSEU, HGBUR, BILIRUBINUR, KETONESUR, PROTEINUR, UROBILINOGEN, NITRITE, LEUKOCYTESUR in the last 72 hours.  Invalid input(s): APPERANCEUR      Imaging: Dg Chest Portable 1 View  Result Date: 05/22/2016 CLINICAL DATA:  Respiratory distress this morning. EXAM: PORTABLE CHEST 1 VIEW COMPARISON:  02/17/2016 FINDINGS: Progressive cardiomegaly. Mild vascular congestion. Questionable hazy opacity at the lung bases, possible pleural effusions. Stimulator tip projects over the central chest, unchanged, battery pack not included. No pneumothorax. IMPRESSION: Progressive cardiomegaly with vascular congestion. Questionable pleural effusions. Electronically Signed   By: Rubye OaksMelanie  Ehinger M.D.   On: 05/22/2016 01:56      Assessment & Plan: Pt is a 65 y.o.  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  [employer] [facility], [facility] [address], [facility]  1. ESRD with volume overload and acute pulmonary edema -  Will dialyze daily for the next few days to improve volume status - Due to severe heart failure patient is at  high risk of complications - iv albumin for oncotic support - UF goal 3-4 kg as tolerated - fluid restriction to 1000-1200 cc /day  2. LE edema - plan as above  3. AOCKD - Hgb 11.8 - will start EPO once Hgb < 11  4. SHPTH - monitor phos  Patient seen during dialysis Tolerating well    HEMODIALYSIS FLOWSHEET:  Blood Flow Rate (mL/min): 400 mL/min Arterial Pressure (mmHg): -160 mmHg Venous Pressure (mmHg): 200 mmHg Transmembrane Pressure (mmHg): 50 mmHg Ultrafiltration Rate (mL/min): 710 mL/min Dialysate Flow Rate (mL/min): 800 ml/min Conductivity: Machine : 14.2 Conductivity: Machine : 14.2 Dialysis Fluid Bolus: Normal Saline Bolus Amount (mL): 250 mL Dialysate Change: Other (comment) (3K) Intra-Hemodialysis Comments: Resting

## 2016-05-22 NOTE — H&P (Signed)
PULMONARY / CRITICAL CARE MEDICINE   Name: Tonio Seider MRN: [id number] DOB: [birth year]    ADMISSION DATE:  05/22/2016 CONSULTATION DATE:05/22/16  REFERRING MD:  Dr. Inocencio Homes  CHIEF COMPLAINT:  Shortness of breath  HISTORY OF PRESENT ILLNESS:   Aarib Pulido is a 66 year old male with known history of end-stage renal disease, diabetes, apical mural thrombus, atrial fibrillation, chronic systolic heart failure, hyperlipidemia and hypertension. Patient is a resident of [facility]. Patient states that he had not being dialyzed in over a week because no one came to pick him up. However he was dialyzed to fully on 10/12. Patient presented to the ED with acute shortness of breath, with oxygen sats down to 80's. Patient was put on BiPAP. Chest x-ray is concerning for pulmonary edema due to volume over load. Patient looks stable at this time on BiPAP. Nephrology team was consulted and they decided that he will be dialyzed in the morning(05/22/16). [facility] M team was called to admit the patient.  PAST MEDICAL HISTORY :  He  has a past medical history of Anemia; Apical mural thrombus; Arthritis; Atrial fibrillation, chronic (HCC); Chronic systolic heart failure (HCC); CKD (chronic kidney disease), stage III; Diabetes mellitus without complication (HCC); Drug use; HLD (hyperlipidemia); HTN (hypertension); Intrahepatic cholestasis (02/20/2016); Mitral regurgitation; and Pneumonia.  PAST SURGICAL HISTORY: He  has a past surgical history that includes AV fistula placement (Right, 06/27/2014); Colonoscopy; Insertion of dialysis catheter (12/15); and Fistula superficialization (Right, 10/25/2014).  No Known Allergies  No current facility-administered medications on file prior to encounter.    Current Outpatient Prescriptions on File Prior to Encounter  Medication Sig  . amiodarone (PACERONE) 200 MG tablet Take 1 tablet (200 mg total) by mouth daily.  Marland Kitchen aspirin EC 81 MG EC tablet Take 1 tablet (81  mg total) by mouth daily.  Marland Kitchen atorvastatin (LIPITOR) 20 MG tablet Take 20 mg by mouth at bedtime.  . insulin NPH Human (HUMULIN N,NOVOLIN N) 100 UNIT/ML injection Inject 0.12 mLs (12 Units total) into the skin at bedtime.  . insulin regular (NOVOLIN R,HUMULIN R) 100 units/mL injection Inject 0.02 mLs (2 Units total) into the skin 3 (three) times daily before meals.  . sevelamer carbonate (RENVELA) 800 MG tablet Take 3 tablets (2,400 mg total) by mouth 3 (three) times daily with meals.    FAMILY HISTORY:  His indicated that his mother is alive. He indicated that his father is deceased.    SOCIAL HISTORY: He  reports that he has never smoked. He has never used smokeless tobacco. He reports that he uses drugs, including Cocaine. He reports that he does not drink alcohol.  REVIEW OF SYSTEMS:   Review of Systems  Constitutional: Negative for chills, diaphoresis, fever, malaise/fatigue and weight loss.  Eyes: Negative for double vision, photophobia and pain.  Respiratory: Positive for shortness of breath. Negative for hemoptysis, sputum production and wheezing.   Cardiovascular: Positive for leg swelling. Negative for chest pain and palpitations.  Gastrointestinal: Negative for abdominal pain, blood in stool, constipation and diarrhea.  Genitourinary: Negative for frequency, hematuria and urgency.  Musculoskeletal: Negative for back pain, joint pain and neck pain.  Neurological: Negative for sensory change, speech change and weakness.  Endo/Heme/Allergies: Negative for environmental allergies.  Psychiatric/Behavioral: Negative for hallucinations and substance abuse. The patient is not nervous/anxious.      SUBJECTIVE:  Patient states that he "feels better and is not short of breath"  VITAL SIGNS: BP 90/72 (BP Location: Left Arm)   Pulse 92  Temp 97.9 F (36.6 C) (Axillary)   Resp 17   Ht 6' (1.829 m)   Wt 117 kg (258 lb)   SpO2 100%   BMI 34.99 kg/m   HEMODYNAMICS:     VENTILATOR SETTINGS: FiO2 (%):  [28 %] 28 %  INTAKE / OUTPUT: No intake/output data recorded.  PHYSICAL EXAMINATION: General:  AA male, found on BiPAP in no acute distress Neuro:  Awake, alert, oriented, follows command, no focal deficits HEENT:  Atraumatic, normocephalic, no disharge, no JVD appreciated Cardiovascular: S1S2, RRR, no MRG noted Lungs:  Diminished breath sounds with crackles. Abdomen: Soft, nontender, active bowel sounds Musculoskeletal:  +3 pitting edema on bilateral lower extremities, blister present Skin:  Blister present, no rash or ulcer noted.  LABS:  BMET  Recent Labs Lab 05/22/16 0132  NA 131*  K 4.6  CL 89*  CO2 30  BUN 32*  CREATININE 5.98*  GLUCOSE 107*    Electrolytes  Recent Labs Lab 05/22/16 0130 05/22/16 0132  CALCIUM  --  8.7*  MG 2.2  --     CBC  Recent Labs Lab 05/22/16 0132  WBC 7.0  HGB 11.8*  HCT 34.7*  PLT 132*    Coag's  Recent Labs Lab 05/22/16 0130  APTT 40*  INR 1.49    Sepsis Markers No results for input(s): LATICACIDVEN, PROCALCITON, O2SATVEN in the last 168 hours.  ABG No results for input(s): PHART, PCO2ART, PO2ART in the last 168 hours.  Liver Enzymes  Recent Labs Lab 05/22/16 0132  AST 37  ALT 17  ALKPHOS 99  BILITOT 3.7*  ALBUMIN 2.9*    Cardiac Enzymes  Recent Labs Lab 05/22/16 0132  TROPONINI <0.03    Glucose No results for input(s): GLUCAP in the last 168 hours.  Imaging Dg Chest Portable 1 View  Result Date: 05/22/2016 CLINICAL DATA:  Respiratory distress this morning. EXAM: PORTABLE CHEST 1 VIEW COMPARISON:  02/17/2016 FINDINGS: Progressive cardiomegaly. Mild vascular congestion. Questionable hazy opacity at the lung bases, possible pleural effusions. Stimulator tip projects over the central chest, unchanged, battery pack not included. No pneumothorax. IMPRESSION: Progressive cardiomegaly with vascular congestion. Questionable pleural effusions. Electronically Signed    By: Rubye Oaks M.D.   On: 05/22/2016 01:56     STUDIES:  none  CULTURES: none  ANTIBIOTICS: none  SIGNIFICANT EVENTS: 10/13>>  Patient with ESRD, admitted to the ICU with volume overload on BiPAP  LINES/TUBES: None  DISCUSSION: 65 yo male with ESRD,Pulmonary edema due to volume overload.  On BiPAP  ASSESSMENT / PLAN:  RENAL A:   End stage Renal disease Hyponatremia possibly related to volume overload Hypoalbuminemia P:   Nephrology consulted, will be dialyzed in a.m. Avoid nephrotoxic drugs Strict intakes output Replace electrolytes per ICU protocol Follow chemistry  PULMONARY A: Acute respiratory failure due to volume overload P:   Continue BiPAP, wean as tolerated Keep O2 sats >92%  CARDIOVASCULAR A:  Acute on chronic systolic heart failure Chronic atrial fibrillation History of hyperlipidemia P:  Continuous telemetry Continue amiodarone Continue aspirin Continue Lipitor GASTROINTESTINAL A:   No active issues P:   Renal diet with fluid restriction  HEMATOLOGIC A:   No active issues P:  Lovenox for DVT prophylaxis  transfuse if Hgb <7   INFECTIOUS A:   No active issues  P:   Monitor fever curve   ENDOCRINE A:   Diabetes mellitus  P:   Blood sugar checks before meals at bedtime resume home insulin regimen SSI Coverage  NEUROLOGIC  A:   No active issues P:    FAMILY  - Updates:  Patient was updated regarding the plan.     Bincy Varughese,AG-ACNP Pulmonary and Critical Care Medicine [facility]   05/22/2016, 2:57 AM  STAFF NOTE: I, Dr. Stephanie AcreVishal Adabella Stanis have personally reviewed patient's available data, including medical history, events of note, physical examination and test results as part of my evaluation. I have discussed with NP Karin GoldenBlakeney Tukov Varughese  and other care providers such as pharmacist, RN and RRT.    HPI: Ill male past medical history of end-stage renal disease on hemodialysis, diabetes,  history of apical mural thrombus, history of atrial fibrillation, chronic systolic heart failure, hyperlipidemia and hypertension, presents with shortness of breath after missing dialysis for the past one week. Patient is a resident of  healthcare, the patient states that he has been some transportation issues with his presentation service over the past week and he has not received adequate dialysis. He was dialyzed fully on 1012, and missed the next scheduled day. Chest x-ray showed pulmonary edema due to volume overload, he was placed on BiPAP and pulmonary was consulted for further management. Nephrology has been notified and the plantar dialyze him today. He admits to mild cough with clear sputum production, states that this happens when he gets fluid overload.   A: A 65-year-old male with history of end-stage renal disease on hemodialysis, now with fluid overload secondary to missed dialysis and pulmonary edema requiring intermittent BiPAP. Only one BiPAP for about 2.5 hours now on nasal cannula and stable.  End-stage renal disease on hemodialysis Hypovolemia Fluid overload Hypoxic respiratory failure, acute Hypoalbuminemia Diabetes Acute on chronic systolic heart failure EF<15% Street of multiple LV apical thrombi line RV enlargement LA enlargement Moderate mitral regurg Moderate tricuspid regurg Chronic atrial fibrillation Hyperlipidemia  P:  -Hemodialysis this morning -Patient stable on nasal cannula of 2 L -Tolerated intermittent BiPAP with significant improvement in his respiratory status. -Missed dialysis leading to fluid overload may have exacerbated his pulmonary status and ensued dyspnea and respiratory failure - After dialysis can be transferred to general medical floor,spoke with Hospitalist (Dr. Auburn BilberryShreyang Patel), transfer to hospitalist service 10/14 AM  .  Rest per NP/medical resident whose note is outlined above and that I agree with  The patient is critically  ill with multiple organ systems failure and requires high complexity decision making for assessment and support, frequent evaluation and titration of therapies, application of advanced monitoring technologies and extensive interpretation of multiple databases.   Critical Care Time devoted to patient care services described in this note is  40 Minutes.   This time reflects time of care of this signee Dr Stephanie AcreVishal Farheen Pfahler.  This critical care time does not reflect procedure time, or teaching time or supervisory time of PA/NP/Med-student/Med Resident etc but could involve care discussion time.  Stephanie AcreVishal Maecy Podgurski, MD [facility] Pager [phone] (please enter 7-digits) On Call Pager - [phone] (please enter 7-digits)  Note: This note was prepared with Dragon dictation along with smaller phrase technology. Any transcriptional errors that result from this process are unintentional.

## 2016-05-22 NOTE — Progress Notes (Signed)
Post dialysis 

## 2016-05-22 NOTE — ED Triage Notes (Signed)
From [facility] via EMS hadn't been dialyzed in 6 days but was dialyzed yesterday. Having trouble breathing and sating 80's on room air at facility.

## 2016-05-22 NOTE — Progress Notes (Signed)
Called by ER. Discussed case with Dr Shaune Pollack. Patient stable on BiPAP. BEing transferred to ICU. Will dialyze in AM

## 2016-05-22 NOTE — Progress Notes (Addendum)
CSW received phone call from Paramedic of [facility]). Per Christiane Ha patient missed his Tuesday dialysis session due to transportation issues. He reports that he's unsure of what those issues were but patient did receive dialysis on Wednesday and Thursday this week before he was admitted into [facility].   Woodroe Mode, MSW, LCSW, LCAS-A Clinical Social Worker [phone]

## 2016-05-23 DIAGNOSIS — R0602 Shortness of breath: Secondary | ICD-10-CM | POA: Diagnosis not present

## 2016-05-23 DIAGNOSIS — I132 Hypertensive heart and chronic kidney disease with heart failure and with stage 5 chronic kidney disease, or end stage renal disease: Secondary | ICD-10-CM | POA: Diagnosis not present

## 2016-05-23 LAB — GLUCOSE, CAPILLARY
GLUCOSE-CAPILLARY: 99 mg/dL (ref 65–99)
Glucose-Capillary: 123 mg/dL — ABNORMAL HIGH (ref 65–99)

## 2016-05-23 LAB — HEPATITIS B SURFACE ANTIBODY, QUANTITATIVE: Hepatitis B-Post: >1000 m[IU]/mL (ref >9.9)

## 2016-05-23 LAB — HEPATITIS B SURFACE ANTIGEN: Hepatitis B Surface Ag: NEGATIVE

## 2016-05-23 MED ORDER — ALBUMIN HUMAN 25 % IV SOLN
25.0000 g | Freq: Once | INTRAVENOUS | Status: AC
  Administered 2016-05-23: 25 g via INTRAVENOUS
  Filled 2016-05-23 (×2): qty 100

## 2016-05-23 NOTE — Progress Notes (Signed)
Dialysis completed without issue, total UF 4L as ordered. Pt tolerated well.

## 2016-05-23 NOTE — Progress Notes (Signed)
Dialysis Initiated  

## 2016-05-23 NOTE — Progress Notes (Signed)
Pre Dialysis 

## 2016-05-23 NOTE — NC FL2 (Signed)
[place] MEDICAID FL2 LEVEL OF CARE SCREENING TOOL     IDENTIFICATION  Patient Name: [name] Birthdate: 10-24-1950 Sex: male Admission Date (Current Location): 05/22/2016  [place] and [place] Number:  Chiropodist and Address:  [facility], [address], [place], [place] [address]      Provider Number: [id number]  Attending Physician Name and Address:  Enedina Finner, MD  Relative Name and Phone Number:       Current Level of Care: Hospital Recommended Level of Care: Skilled Nursing Facility Prior Approval Number:    Date Approved/Denied: 03/16/14 PASRR Number: [id number]  Discharge Plan: SNF    Current Diagnoses: Patient Active Problem List   Diagnosis Date Noted  . ESRD (end stage renal disease) on dialysis (HCC) 05/03/2016  . Generalized weakness 05/02/2016  . Intrahepatic cholestasis 02/20/2016  . Direct hyperbilirubinemia   . Paroxysmal atrial fibrillation (HCC)   . Shortness of breath   . Type 2 diabetes mellitus with diabetic nephropathy, without long-term current use of insulin (HCC)   . End stage renal disease (HCC) 12/25/2014  . Atrial fibrillation [I48.91] 08/09/2014  . Encounter for therapeutic drug monitoring 08/09/2014  . Chronic systolic heart failure (HCC) 07/04/2014  . ESRD (end stage renal disease) (HCC) 07/04/2014  . PAF (paroxysmal atrial fibrillation) (HCC) 07/04/2014  . Controlled diabetes mellitus type II without complication (HCC) 06/22/2014  . Gout 06/22/2014  . ESRD needing dialysis (HCC)   . Pulmonary edema   . Bacteremia associated with intravascular line (HCC)   . Blood poisoning (HCC)   . Acute respiratory failure with hypoxia (HCC)   . AKI (acute kidney injury) (HCC)   . Screen for STD (sexually transmitted disease)   . Acute respiratory failure (HCC) 06/08/2014  . Cardiogenic shock (HCC) 06/08/2014  . Acute on chronic systolic congestive heart failure (HCC) 06/08/2014  . Acute on  chronic renal failure (HCC) 06/08/2014  . Hyperkalemia 06/08/2014  . Respiratory failure (HCC) 06/08/2014    Orientation RESPIRATION BLADDER Height & Weight     Self, Time, Situation  Normal Continent Weight: 286 lb 3.2 oz (129.8 kg) Height:  6' (182.9 cm)  BEHAVIORAL SYMPTOMS/MOOD NEUROLOGICAL BOWEL NUTRITION STATUS      Continent    AMBULATORY STATUS COMMUNICATION OF NEEDS Skin   Limited Assist Verbally Normal                       Personal Care Assistance Level of Assistance    Bathing Assistance: Limited assistance Feeding assistance: Independent Dressing Assistance: Limited assistance     Functional Limitations Info    Sight Info: Adequate Hearing Info: Adequate Speech Info: Adequate    SPECIAL CARE FACTORS FREQUENCY  PT (By licensed PT)     PT Frequency: Minimum 3X per day               Contractures Contractures Info: Present    Additional Factors Info  Insulin Sliding Scale       Insulin Sliding Scale Info: 0-9 units insulin 3X per day with meals; 0-5 unitis insulin daily at bedtime       Current Medications (05/23/2016):  This is the current hospital active medication list Current Facility-Administered Medications  Medication Dose Route Frequency Provider Last Rate Last Dose  . amiodarone (PACERONE) tablet 200 mg  200 mg Oral Daily Gwendolyn Fill, NP   Stopped at 05/23/16 0920  . aspirin EC tablet 81 mg  81 mg Oral Daily Bincy S  Varughese, NP   Stopped at 05/23/16 [id number]  . atorvastatin (LIPITOR) tablet 20 mg  20 mg Oral q1800 Bincy S Varughese, NP   20 mg at 05/22/16 1731  . chlorhexidine (PERIDEX) 0.12 % solution 15 mL  15 mL Mouth Rinse BID Gwendolyn FillBincy S Varughese, NP   Stopped at 05/23/16 0942  . heparin injection 5,000 Units  5,000 Units Subcutaneous Q8H Bincy S Varughese, NP   5,000 Units at 05/23/16 0521  . insulin aspart (novoLOG) injection 2 Units  2 Units Subcutaneous TID AC Bincy S Varughese, NP   2 Units at 05/22/16 1726  . insulin  detemir (LEVEMIR) injection 12 Units  12 Units Subcutaneous QHS Gwendolyn FillBincy S Varughese, NP   12 Units at 05/22/16 2131  . ipratropium-albuterol (DUONEB) 0.5-2.5 (3) MG/3ML nebulizer solution 3 mL  3 mL Nebulization Q4H PRN Vishal Mungal, MD      . MEDLINE mouth rinse  15 mL Mouth Rinse q12n4p Bincy S Varughese, NP      . midodrine (PROAMATINE) tablet 10 mg  10 mg Oral Daily PRN Mosetta PigeonHarmeet Singh, MD   10 mg at 05/23/16 0941  . sevelamer carbonate (RENVELA) tablet 2,400 mg  2,400 mg Oral TID WC Bincy S Varughese, NP   2,400 mg at 05/23/16 [id number]     Discharge Medications: Please see discharge summary for a list of discharge medications.  Relevant Imaging Results:  Relevant Lab Results:   Additional Information  SS #[id number]  Judi CongKaren M Henya Aguallo, LCSW

## 2016-05-23 NOTE — Discharge Summary (Signed)
[facility] [place] at [facility]   PATIENT NAME: [name] MaceKenneth Zawadzki    MR#:  [id number]  DATE OF BIRTH:  06-Mar-1951  DATE OF ADMISSION:  05/22/2016 ADMITTING PHYSICIAN: Stephanie AcreVishal Mungal, MD  DATE OF DISCHARGE: 05/23/16  PRIMARY CARE PHYSICIAN: Dimple CaseySean A Smith, MD    ADMISSION DIAGNOSIS:  Acute on chronic systolic congestive heart failure (HCC) [I50.23] Acute respiratory failure with hypoxia (HCC) [J96.01]  DISCHARGE DIAGNOSIS:  Acute Pulmonary edema due to missing HD-improved Acute on chronic CHF, systolic due to pulmonary vascular congestion ESRD on HD Chronic afib SECONDARY DIAGNOSIS:   Past Medical History:  Diagnosis Date  . Anemia   . Apical mural thrombus   . Arthritis   . Atrial fibrillation, chronic (HCC)   . Chronic systolic heart failure (HCC)    a) ECHO [facility] 02/2014): EF <15%, multiple apical thrombi, RV mod enlarged, mod MR b. [facility]: RA 16, RV 64/9 (15), PA 64/32 (42), PCWP: 24, CI: 1.4  . CKD (chronic kidney disease), stage III   . Diabetes mellitus without complication (HCC)   . Drug use    cocaine  . HLD (hyperlipidemia)   . HTN (hypertension)   . Intrahepatic cholestasis 02/20/2016  . Mitral regurgitation   . Pneumonia     HOSPITAL COURSE:   Pt is a 65 y.o. 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 Feels better -wean to RA -prn oxygen use to keep sats>92% -was on BIPAP now off of it  2. LE edema - plan as  above  3. ESRD on HD  4.Severe CMP with EF 10%, chronic afib on amiodarone  5.DM-2 resumed home dose insulin  Will d/c after HD today if remains stable  CONSULTS OBTAINED:    DRUG ALLERGIES:  No Known Allergies  DISCHARGE MEDICATIONS:   Current Discharge Medication List    CONTINUE these medications which have NOT CHANGED   Details  amiodarone (PACERONE) 200 MG tablet Take 1 tablet (200 mg total) by mouth daily. Qty: 30 tablet, Refills: 6    aspirin EC 81 MG EC tablet Take 1 tablet (81 mg total) by mouth daily.    atorvastatin (LIPITOR) 20 MG tablet Take 20 mg by mouth at bedtime.    hydrocortisone cream 1 % Apply 1 application topically 3 (three) times daily.    insulin NPH Human (HUMULIN N,NOVOLIN N) 100 UNIT/ML injection Inject 0.12 mLs (12 Units total) into the skin at bedtime. Qty: 10 mL, Refills: 1    insulin regular (NOVOLIN R,HUMULIN R) 100 units/mL injection Inject 0.02 mLs (2 Units total) into the skin 3 (three) times daily before meals. Qty: 10 mL, Refills: 1    sevelamer carbonate (RENVELA) 800 MG tablet Take 3 tablets (2,400 mg total) by mouth 3 (three) times daily with meals. Qty: 90 tablet, Refills: 0        If you experience worsening of your admission symptoms, develop shortness of breath, life threatening emergency, suicidal or homicidal thoughts you must seek medical attention  immediately by calling 911 or calling your MD immediately  if symptoms less severe.  You Must read complete instructions/literature along with all the possible adverse reactions/side effects for all the Medicines you take and that have been prescribed to you. Take any new Medicines after you have completely understood and accept all the possible adverse reactions/side effects.   Please note  You were cared for by a hospitalist during your hospital stay. If you have any questions about your discharge medications or the care you received while you were in the hospital after you  are discharged, you can call the unit and asked to speak with the hospitalist on call if the hospitalist that took care of you is not available. Once you are discharged, your primary care physician will handle any further medical issues. Please note that NO REFILLS for any discharge medications will be authorized once you are discharged, as it is imperative that you return to your primary care physician (or establish a relationship with a primary care physician if you do not have one) for your aftercare needs so that they can reassess your need for medications and monitor your lab values. Today   SUBJECTIVE   Seen at HD today  VITAL SIGNS:  Blood pressure 99/76, pulse 93, temperature 98.3 F (36.8 C), temperature source Oral, resp. rate 17, height 6' (1.829 m), weight 129.8 kg (286 lb 3.2 oz), SpO2 100 %.  I/O:   Intake/Output Summary (Last 24 hours) at 05/23/16 1143 Last data filed at 05/23/16 0900  Gross per 24 hour  Intake              480 ml  Output             3500 ml  Net            -3020 ml    PHYSICAL EXAMINATION:  GENERAL:  65 y.o.-year-old patient lying in the bed with no acute distress.  EYES: Pupils equal, round, reactive to light and accommodation. No scleral icterus. Extraocular muscles intact.  HEENT: Head atraumatic, normocephalic. Oropharynx and nasopharynx clear.  NECK:  Supple, no jugular venous distention. No thyroid enlargement, no tenderness.  LUNGS: Normal breath sounds bilaterally, no wheezing, rales,rhonchi or crepitation. No use of accessory muscles of respiration.  CARDIOVASCULAR: S1, S2 normal. No murmurs, rubs, or gallops.  ABDOMEN: Soft, non-tender, non-distended. Bowel sounds present. No organomegaly or mass.  EXTREMITIES: No pedal edema, cyanosis, or clubbing.  NEUROLOGIC: Cranial nerves II through XII are intact. Muscle strength 5/5 in all extremities. Sensation intact. Gait not checked.  PSYCHIATRIC: The patient is alert and oriented x 3.  SKIN: No  obvious rash, lesion, or ulcer.   DATA REVIEW:   CBC   Recent Labs Lab 05/22/16 0132  WBC 7.0  HGB 11.8*  HCT 34.7*  PLT 132*    Chemistries   Recent Labs Lab 05/22/16 0447  NA 132*  K 4.2  CL 89*  CO2 32  GLUCOSE 83  BUN 32*  CREATININE 6.08*  CALCIUM 8.7*  MG 2.2  AST 36  ALT 18  ALKPHOS 97  BILITOT 3.5*    Microbiology Results   Recent Results (from the past 240 hour(s))  MRSA PCR Screening     Status: None   Collection Time: 05/22/16  3:48 AM  Result Value Ref Range Status   MRSA by PCR NEGATIVE NEGATIVE Final    Comment:        The GeneXpert MRSA Assay (FDA approved for NASAL specimens  only), is one component of a comprehensive MRSA colonization surveillance program. It is not intended to diagnose MRSA infection nor to guide or monitor treatment for MRSA infections.     RADIOLOGY:  Dg Chest Portable 1 View  Result Date: 05/22/2016 CLINICAL DATA:  Respiratory distress this morning. EXAM: PORTABLE CHEST 1 VIEW COMPARISON:  02/17/2016 FINDINGS: Progressive cardiomegaly. Mild vascular congestion. Questionable hazy opacity at the lung bases, possible pleural effusions. Stimulator tip projects over the central chest, unchanged, battery pack not included. No pneumothorax. IMPRESSION: Progressive cardiomegaly with vascular congestion. Questionable pleural effusions. Electronically Signed   By: Rubye Oaks M.D.   On: 05/22/2016 01:56     Management plans discussed with the patient, family and they are in agreement.  CODE STATUS:     Code Status Orders        Start     Ordered   05/22/16 0248  Full code  Continuous     05/22/16 0249    Code Status History    Date Active Date Inactive Code Status Order ID Comments User Context   05/03/2016 12:21 AM 05/06/2016  9:49 PM Full Code [id number]  Michael Litter, MD Inpatient   02/17/2016  7:38 PM 02/22/2016 11:44 PM DNR [id number]  Renne Musca, MD Inpatient   06/22/2014  2:36 PM 06/28/2014  8:24 PM  Full Code [id number]  Berdine Dance, MD Inpatient   06/08/2014 12:10 PM 06/22/2014  2:36 PM Full Code [id number]  Alyson Reedy, MD ED      TOTAL TIME TAKING CARE OF THIS PATIENT: 40 minutes.    Lisandro Meggett M.D on 05/23/2016 at 11:43 AM  Between 7am to 6pm - Pager - [phone] After 6pm go to [email or web address] - password [email or web address] [facility]  [place]  [facility]  Office  [phone]  CC: Primary care physician; Dimple Casey, MD

## 2016-05-23 NOTE — Progress Notes (Signed)
Pt arrived on unit from dialysis. Verbal order from Dr. Allena Katz to place D/C order, back to [facility].  [name]

## 2016-05-23 NOTE — Progress Notes (Signed)
Post Dialysis 

## 2016-05-23 NOTE — Progress Notes (Signed)
Report given to receiving nurse at [facility]. Transport has been called for non-emergant transfer to [facility]. Awaiting transportation.   [name]

## 2016-05-23 NOTE — Progress Notes (Signed)
Pt prepared for d/c to SNF. IV and central monitoring d/c'd. Skin intact except as charted in most recent assessments. Vitals are stable.Pt to be transported by ambulance service.  [name]

## 2016-05-23 NOTE — Discharge Instructions (Signed)
Resume your HD T TH SAT as before Use oxygen as needed to keep sats greater than 92%

## 2016-05-23 NOTE — Clinical Social Work Note (Signed)
Client to dc back to [facility] via non-emergent EMS due to weakness. The CSW attempted multiple times to contact family members, but none of the phone numbers in the file would connect. Facility is aware that patient will dc back today. CSW will con't to follow pending any dc needs.  Argentina Ponder, MSW, LCSW-A [phone]

## 2016-05-23 NOTE — Progress Notes (Signed)
Subjective:   Patient seen during dialysis Tolerating well    HEMODIALYSIS FLOWSHEET:  Blood Flow Rate (mL/min): 300 mL/min Arterial Pressure (mmHg): -90 mmHg Venous Pressure (mmHg): 160 mmHg Transmembrane Pressure (mmHg): 50 mmHg Ultrafiltration Rate (mL/min): 1020 mL/min Dialysate Flow Rate (mL/min): 600 ml/min Conductivity: Machine : 14 Conductivity: Machine : 14 Dialysis Fluid Bolus: Normal Saline Bolus Amount (mL): 100 mL Dialysate Change:  (3k 2.5ca) Intra-Hemodialysis Comments: 1231. pt sleeping. vs stable  3500 cc fluid removed with HD yesterday  Objective:  Vital signs in last 24 hours:  Temp:  [97.7 F (36.5 C)-98.5 F (36.9 C)] 98.3 F (36.8 C) (10/14 0956) Pulse Rate:  [88-102] 97 (10/14 1118) Resp:  [12-24] 13 (10/14 1118) BP: (85-114)/(57-80) 91/62 (10/14 1118) SpO2:  [94 %-100 %] 100 % (10/14 1118) FiO2 (%):  [36 %] 36 % (10/13 1530) Weight:  [128.1 kg (282 lb 4.8 oz)-129.8 kg (286 lb 3.2 oz)] 129.8 kg (286 lb 3.2 oz) (10/14 0956)  Weight change: 14.9 kg (32 lb 12.6 oz) Filed Weights   05/22/16 1000 05/23/16 0500 05/23/16 0956  Weight: 131.9 kg (290 lb 12.6 oz) 128.1 kg (282 lb 4.8 oz) 129.8 kg (286 lb 3.2 oz)    Intake/Output:    Intake/Output Summary (Last 24 hours) at 05/23/16 1128 Last data filed at 05/23/16 0900  Gross per 24 hour  Intake              480 ml  Output             3500 ml  Net            -3020 ml     Physical Exam: General: NAD, laying in bed  HEENT Moist oral mucus membranes  Neck supple  Pulm/lungs Normal effort, basilar crackles  CVS/Heart No rub  Abdomen:  Soft, NT  Extremities: ++ dependent edema  Neurologic: Alert, oriented  Skin: dry  Access: Rt arm AVF       Basic Metabolic Panel:   Recent Labs Lab 05/22/16 0130 05/22/16 0132 05/22/16 0447  NA  --  131* 132*  K  --  4.6 4.2  CL  --  89* 89*  CO2  --  30 32  GLUCOSE  --  107* 83  BUN  --  32* 32*  CREATININE  --  5.98* 6.08*  CALCIUM  --  8.7*  8.7*  MG 2.2  --  2.2  PHOS  --   --  4.0     CBC:  Recent Labs Lab 05/22/16 0132  WBC 7.0  NEUTROABS 4.6  HGB 11.8*  HCT 34.7*  MCV 92.4  PLT 132*      Microbiology:  Recent Results (from the past 720 hour(s))  MRSA PCR Screening     Status: None   Collection Time: 05/03/16  1:10 AM  Result Value Ref Range Status   MRSA by PCR NEGATIVE NEGATIVE Final    Comment:        The GeneXpert MRSA Assay (FDA approved for NASAL specimens only), is one component of a comprehensive MRSA colonization surveillance program. It is not intended to diagnose MRSA infection nor to guide or monitor treatment for MRSA infections.   MRSA PCR Screening     Status: None   Collection Time: 05/22/16  3:48 AM  Result Value Ref Range Status   MRSA by PCR NEGATIVE NEGATIVE Final    Comment:        The GeneXpert MRSA Assay (FDA approved for NASAL  specimens only), is one component of a comprehensive MRSA colonization surveillance program. It is not intended to diagnose MRSA infection nor to guide or monitor treatment for MRSA infections.     Coagulation Studies:  Recent Labs  05/22/16 0130  LABPROT 18.2*  INR 1.49    Urinalysis: No results for input(s): COLORURINE, LABSPEC, PHURINE, GLUCOSEU, HGBUR, BILIRUBINUR, KETONESUR, PROTEINUR, UROBILINOGEN, NITRITE, LEUKOCYTESUR in the last 72 hours.  Invalid input(s): APPERANCEUR    Imaging: Dg Chest Portable 1 View  Result Date: 05/22/2016 CLINICAL DATA:  Respiratory distress this morning. EXAM: PORTABLE CHEST 1 VIEW COMPARISON:  02/17/2016 FINDINGS: Progressive cardiomegaly. Mild vascular congestion. Questionable hazy opacity at the lung bases, possible pleural effusions. Stimulator tip projects over the central chest, unchanged, battery pack not included. No pneumothorax. IMPRESSION: Progressive cardiomegaly with vascular congestion. Questionable pleural effusions. Electronically Signed   By: Rubye OaksMelanie  Ehinger M.D.   On:  05/22/2016 01:56     Medications:     . amiodarone  200 mg Oral Daily  . aspirin EC  81 mg Oral Daily  . atorvastatin  20 mg Oral q1800  . chlorhexidine  15 mL Mouth Rinse BID  . heparin subcutaneous  5,000 Units Subcutaneous Q8H  . insulin aspart  2 Units Subcutaneous TID AC  . insulin detemir  12 Units Subcutaneous QHS  . mouth rinse  15 mL Mouth Rinse q12n4p  . sevelamer carbonate  2,400 mg Oral TID WC   ipratropium-albuterol, midodrine  Assessment/ Plan:  65 y.o. 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  [facility], [facility] [address] [facility], [facility]  1. ESRD with volume overload and acute pulmonary edema -  3500 CC removed with HD yesterday. - Due to severe heart failure patient is at high risk of complications - iv albumin for oncotic support - UF goal 3-4 kg as tolerated - fluid restriction to 1000-1200 cc /day discussed with patient and educated re: fluid and salt restriction  2. LE edema - plan as above  3. AOCKD - Hgb 11.8 - will start EPO once Hgb < 11  4. SHPTH - monitor phos  Patient seen during dialysis Tolerating well    LOS: 1 Braxdon Gappa 10/14/201711:28 AM

## 2016-06-02 IMAGING — CR DG CHEST 1V PORT
1 series · 1 of 1 positions shown · non-contrast
Comparison: 06/08/2014

CLINICAL DATA: Intubation. Diabetes. Heart failure. Chronic kidney
disease.

EXAM:
PORTABLE CHEST - 1 VIEW

[AP]
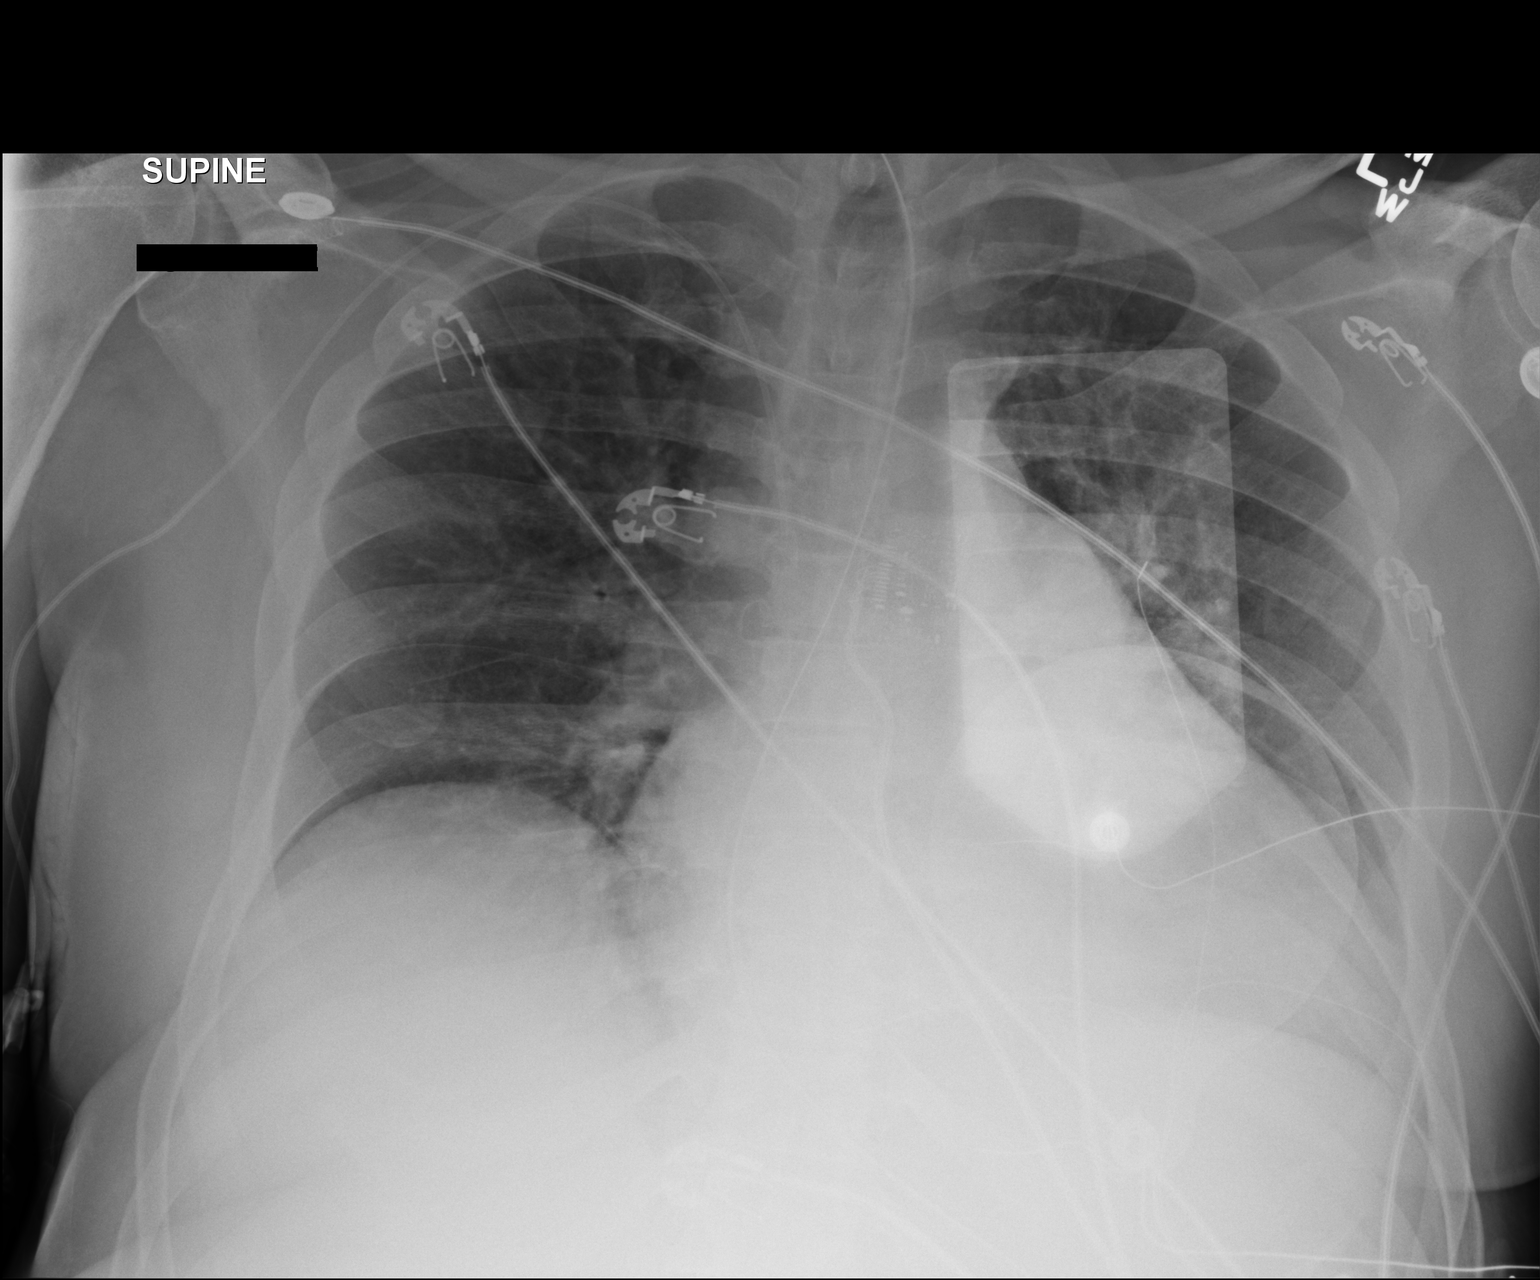

[1 of 1 positions shown; findings below may reference images not displayed]

FINDINGS: A nasogastric tube extends down to the stomach. A right-sided PICC
line terminates over the SVC.

There is some tubing projecting over the lower neck. This is about
2.5 cm above the thoracic inlet. If this is an endotracheal tube, it
probably needs to be advanced about 7 cm.

Moderate enlargement of the cardiopericardial silhouette. Low lung
volumes.
IMPRESSION: 1. Tubing projects over the lower neck, well above the thoracic
inlet. If this is an endotracheal tube, it probably needs to be
advanced about 7 cm.
2. Moderate enlargement of the cardiopericardial silhouette.
These results will be called to the ordering clinician or
representative by the Radiologist Assistant, and communication
documented in the PACS or zVision Dashboard.

## 2016-06-02 IMAGING — US US RENAL
1 series · 14 of 25 positions shown · non-contrast
Comparison: None.

CLINICAL DATA: Chronic kidney disease.

EXAM:
RENAL/URINARY TRACT ULTRASOUND COMPLETE

[Series 1: us renal · 0.22mm/px · 14 of 30 slices shown]
[im 1/30]
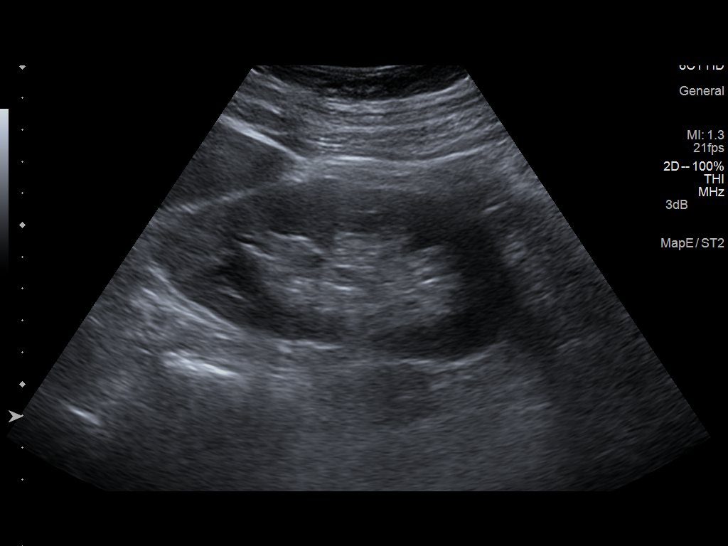
[im 3/30]
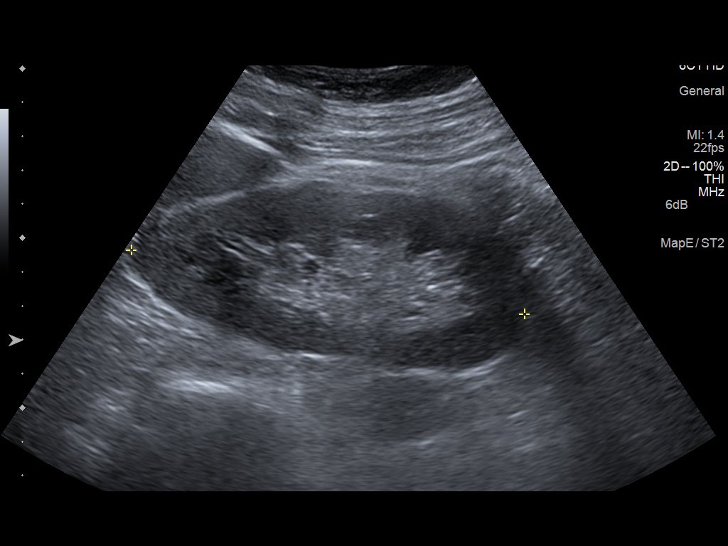
[im 5/30]
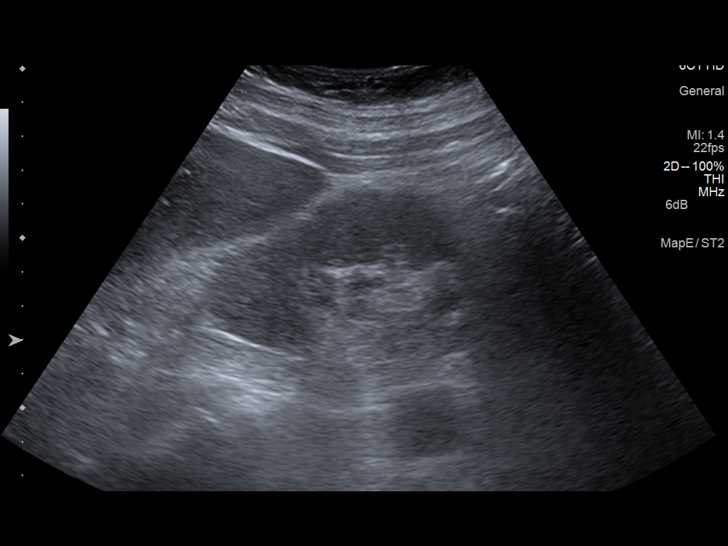
[im 8/30]
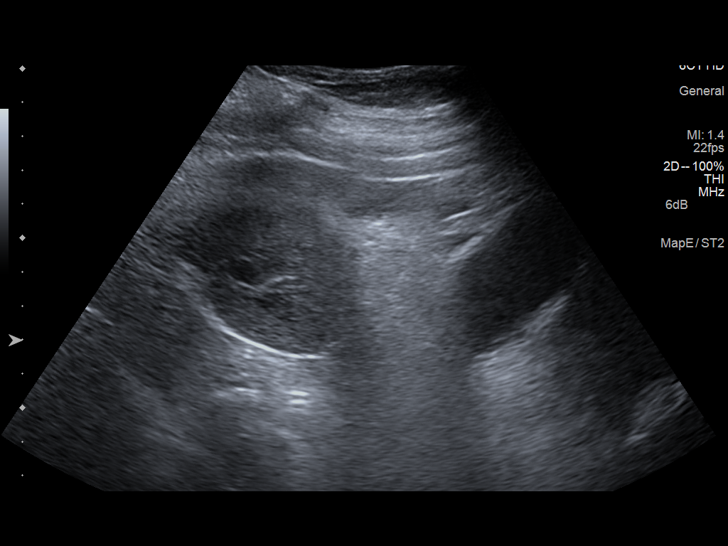
[im 10/30]
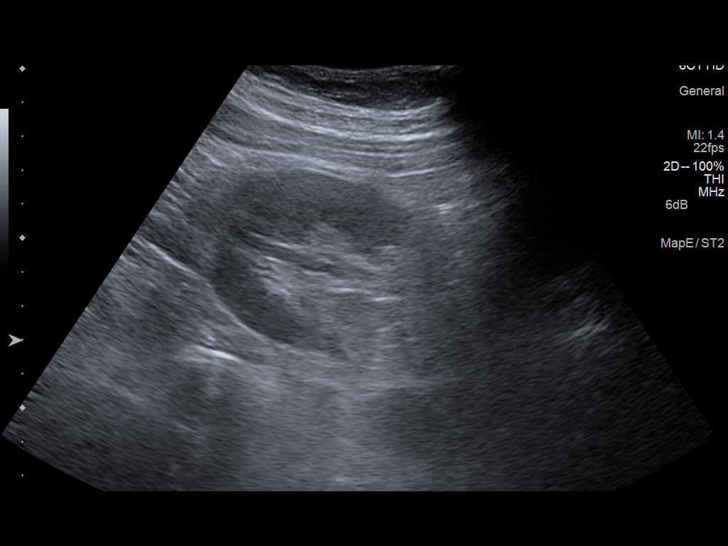
[im 11/30]
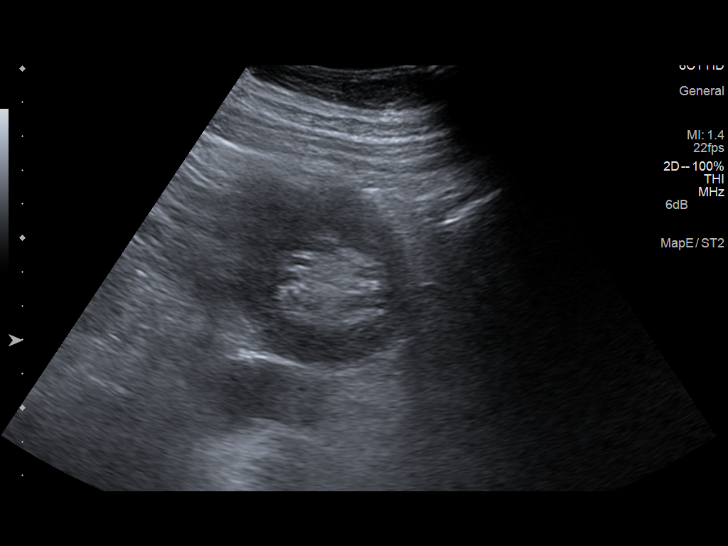
[im 14/30]
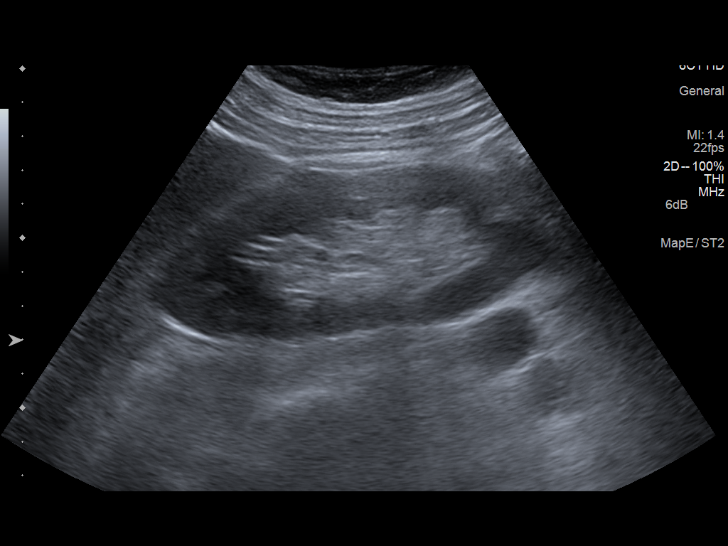
[im 16/30]
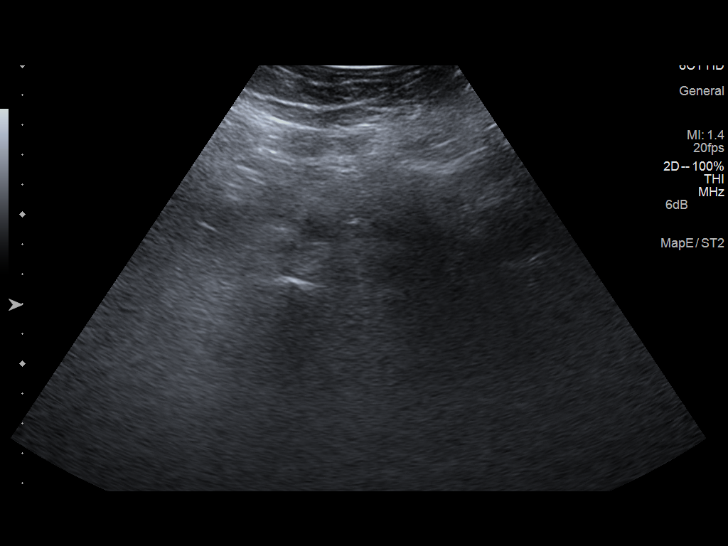
[im 19/30]
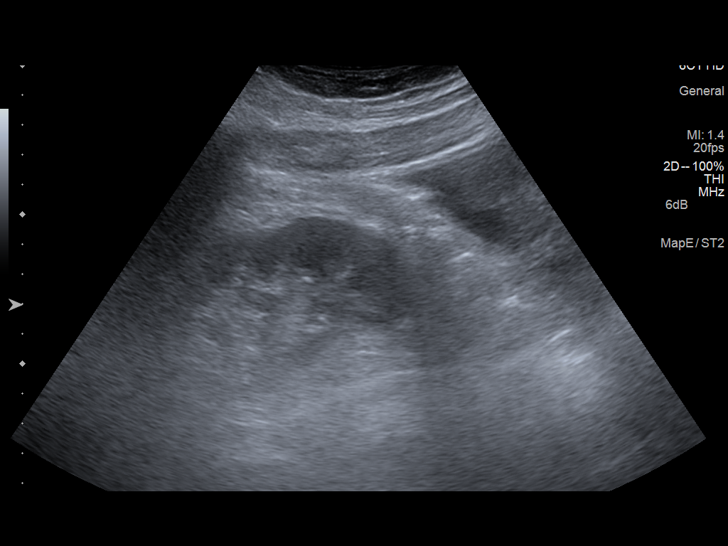
[im 20/30]
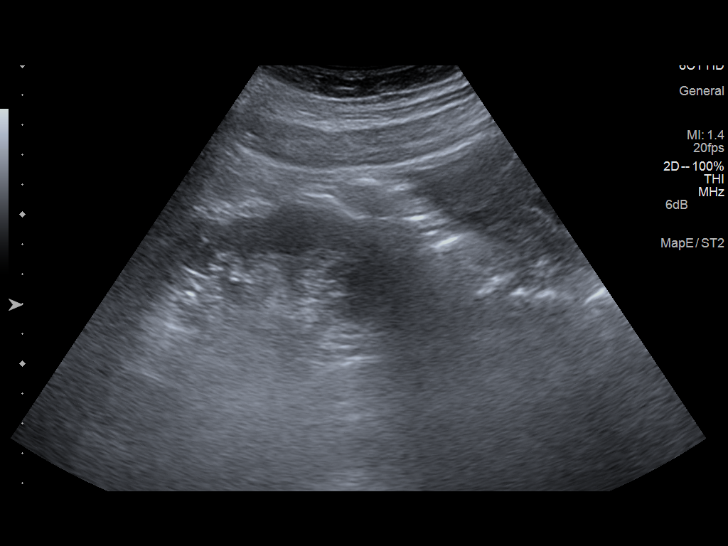
[im 22/30]
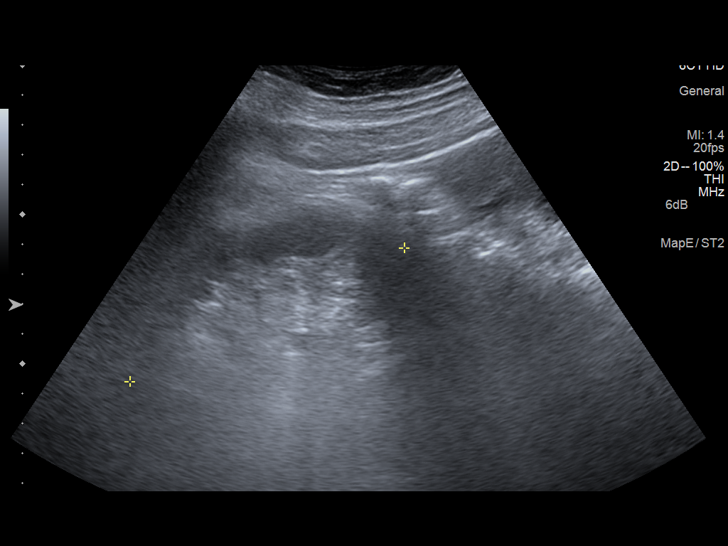
[im 25/30]
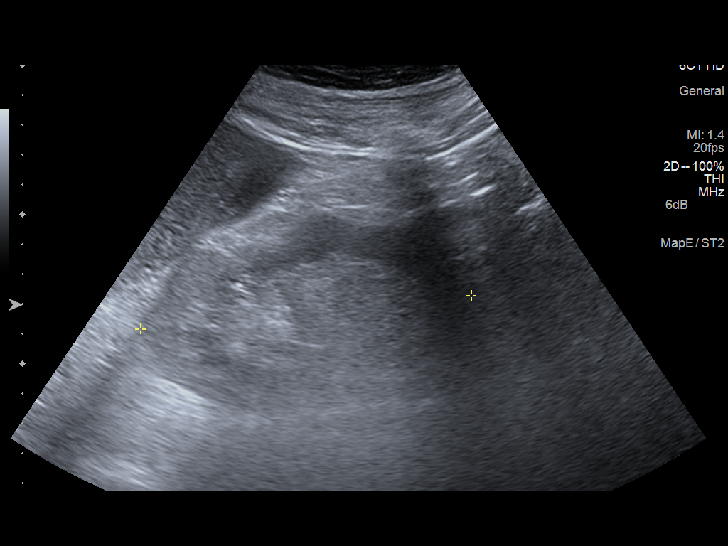
[im 27/30]
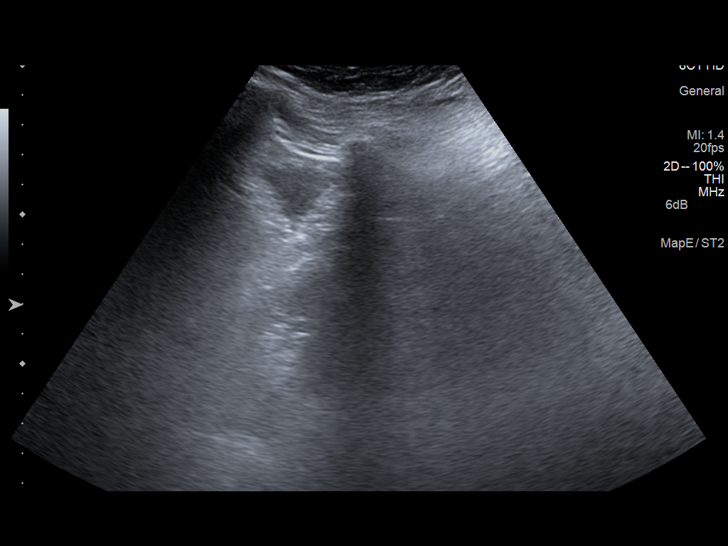
[im 30/30]
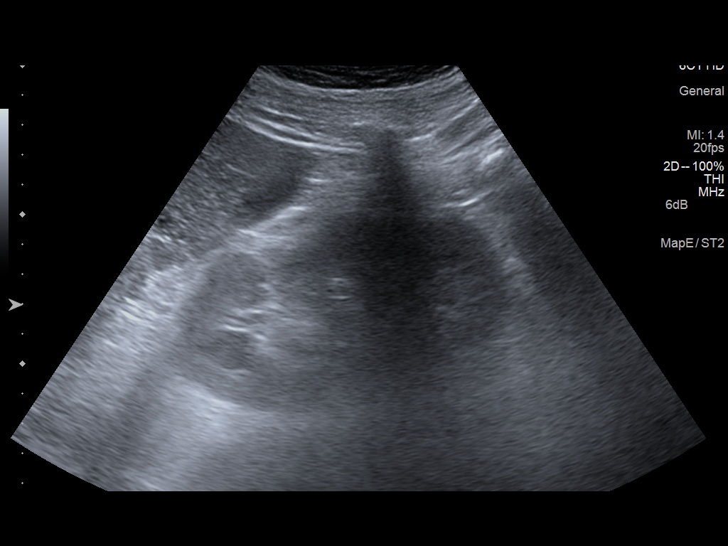

[14 of 25 positions shown; findings below may reference images not displayed]

FINDINGS: Right Kidney:

Length: 11.7 cm in length. Echogenicity within normal limits. No
mass or hydronephrosis visualized.

Left Kidney:

Length: 11.1 cm in length. Echogenicity within normal limits. No
mass or hydronephrosis visualized.

Bladder:

The bladder cannot be visualized. This likely represents that it is
completely decompressed.
IMPRESSION: Within normal limits.

The bladder is most likely completely decompressed by the Foley
catheter since it was not visualized.

## 2016-06-12 ENCOUNTER — Encounter: Payer: Self-pay | Admitting: Emergency Medicine

## 2016-06-12 ENCOUNTER — Emergency Department: Payer: Medicare HMO

## 2016-06-12 ENCOUNTER — Emergency Department
Admission: EM | Admit: 2016-06-12 | Discharge: 2016-06-12 | Disposition: A | Discharge status: Home / Self Care | Payer: Medicare HMO | Attending: Emergency Medicine | Admitting: Emergency Medicine

## 2016-06-12 DIAGNOSIS — Z7982 Long term (current) use of aspirin: Secondary | ICD-10-CM | POA: Diagnosis not present

## 2016-06-12 DIAGNOSIS — Z79899 Other long term (current) drug therapy: Secondary | ICD-10-CM | POA: Diagnosis not present

## 2016-06-12 DIAGNOSIS — I132 Hypertensive heart and chronic kidney disease with heart failure and with stage 5 chronic kidney disease, or end stage renal disease: Secondary | ICD-10-CM | POA: Diagnosis not present

## 2016-06-12 DIAGNOSIS — N186 End stage renal disease: Secondary | ICD-10-CM | POA: Insufficient documentation

## 2016-06-12 DIAGNOSIS — Z794 Long term (current) use of insulin: Secondary | ICD-10-CM | POA: Insufficient documentation

## 2016-06-12 DIAGNOSIS — E1122 Type 2 diabetes mellitus with diabetic chronic kidney disease: Secondary | ICD-10-CM | POA: Insufficient documentation

## 2016-06-12 DIAGNOSIS — R0602 Shortness of breath: Secondary | ICD-10-CM | POA: Diagnosis present

## 2016-06-12 DIAGNOSIS — Z992 Dependence on renal dialysis: Secondary | ICD-10-CM | POA: Insufficient documentation

## 2016-06-12 DIAGNOSIS — I509 Heart failure, unspecified: Secondary | ICD-10-CM

## 2016-06-12 LAB — CBC
HEMATOCRIT: 35.8 % — AB (ref 40.0–52.0)
Hemoglobin: 12 g/dL — ABNORMAL LOW (ref 13.0–18.0)
MCH: 30.3 pg (ref 26.0–34.0)
MCHC: 33.6 g/dL (ref 32.0–36.0)
MCV: 90.3 fL (ref 80.0–100.0)
PLATELETS: 205 10*3/uL (ref 150–440)
RBC: 3.96 MIL/uL — AB (ref 4.40–5.90)
RDW: 16.6 % — AB (ref 11.5–14.5)
WBC: 6 10*3/uL (ref 3.8–10.6)

## 2016-06-12 LAB — BASIC METABOLIC PANEL
Anion gap: 12 (ref 5–15)
BUN: 29 mg/dL — AB (ref 6–20)
CHLORIDE: 87 mmol/L — AB (ref 101–111)
CO2: 33 mmol/L — ABNORMAL HIGH (ref 22–32)
CREATININE: 5.41 mg/dL — AB (ref 0.61–1.24)
Calcium: 9 mg/dL (ref 8.9–10.3)
GFR, EST AFRICAN AMERICAN: 11 mL/min — AB (ref >60)
GFR, EST NON AFRICAN AMERICAN: 10 mL/min — AB (ref >60)
Glucose, Bld: 150 mg/dL — ABNORMAL HIGH (ref 65–99)
POTASSIUM: 4.8 mmol/L (ref 3.5–5.1)
SODIUM: 132 mmol/L — AB (ref 135–145)

## 2016-06-12 LAB — TROPONIN I: Troponin I: <0.03 ng/mL (ref <0.03)

## 2016-06-12 MED ORDER — HYDROCOD POLST-CPM POLST ER 10-8 MG/5ML PO SUER
5.0000 mL | Freq: Once | ORAL | Status: AC
  Administered 2016-06-12: 5 mL via ORAL
  Filled 2016-06-12: qty 5

## 2016-06-12 MED ORDER — AZITHROMYCIN 250 MG PO TABS: 250.0000 mg | ORAL_TABLET | Freq: Every day | ORAL | 0 refills | Status: DC

## 2016-06-12 MED ORDER — GUAIFENESIN-CODEINE 100-10 MG/5ML PO SOLN: 5.0000 mL | Freq: Four times a day (QID) | ORAL | 0 refills | Status: DC | PRN

## 2016-06-12 MED ORDER — AZITHROMYCIN 500 MG PO TABS
500.0000 mg | ORAL_TABLET | Freq: Once | ORAL | Status: AC
  Administered 2016-06-12: 500 mg via ORAL
  Filled 2016-06-12: qty 1

## 2016-06-12 NOTE — ED Triage Notes (Signed)
Pt comes into the ED via EMS from [facility] care c.o shortness of breath.  Patient has chronic respiratory failure episodes.  Wears 3L all the time and was sating at 98% upon EMS arrival.  Patient in NAD at this time with even and unlabored respirations.

## 2016-06-12 NOTE — ED Notes (Signed)
Called [facility] to update them on pt status and let them know pt is being discharged.

## 2016-06-12 NOTE — ED Provider Notes (Signed)
[facility] Emergency Department Provider Note  Time seen: 3:27 PM  I have reviewed the triage vital signs and the nursing notes.   HISTORY  Chief Complaint Shortness of Breath    HPI [name] is a 65 y.o. male with multiple medical problems including end-stage renal disease on hemodialysis who presents to the emergency department or shortness of breath. According to the patient approximately 6 hours ago he developed acute onset of shortness of breath. States he has been coughing for the past 2 days. Believes he went to dialysis yesterday but is not sure. He believes he takes out since Tuesday, Thursday, Saturday, but is not sure. Patient denies any chest pain. Patient currently lives at [facility]. No acute distress at this time. Per EMS the patient were 3 L of oxygen via nasal cannula at baseline.  Past Medical History:  Diagnosis Date  . Anemia   . Apical mural thrombus   . Arthritis   . Atrial fibrillation, chronic (HCC)   . Chronic systolic heart failure (HCC)    a) ECHO [facility] 02/2014): EF <15%, multiple apical thrombi, RV mod enlarged, mod MR b. [facility]: RA 16, RV 64/9 (15), PA 64/32 (42), PCWP: 24, CI: 1.4  . CKD (chronic kidney disease), stage III   . Diabetes mellitus without complication (HCC)   . Drug use    cocaine  . HLD (hyperlipidemia)   . HTN (hypertension)   . Intrahepatic cholestasis 02/20/2016  . Mitral regurgitation   . Pneumonia     Patient Active Problem List   Diagnosis Date Noted  . ESRD (end stage renal disease) on dialysis (HCC) 05/03/2016  . Generalized weakness 05/02/2016  . Intrahepatic cholestasis 02/20/2016  . Direct hyperbilirubinemia   . Paroxysmal atrial fibrillation (HCC)   . Shortness of breath   . Type 2 diabetes mellitus with diabetic nephropathy, without long-term current use of insulin (HCC)   . End stage renal disease (HCC) 12/25/2014  . Atrial fibrillation  [I48.91] 08/09/2014  . Encounter for therapeutic drug monitoring 08/09/2014  . Chronic systolic heart failure (HCC) 07/04/2014  . ESRD (end stage renal disease) (HCC) 07/04/2014  . PAF (paroxysmal atrial fibrillation) (HCC) 07/04/2014  . Controlled diabetes mellitus type II without complication (HCC) 06/22/2014  . Gout 06/22/2014  . ESRD needing dialysis (HCC)   . Pulmonary edema   . Bacteremia associated with intravascular line (HCC)   . Blood poisoning (HCC)   . Acute respiratory failure with hypoxia (HCC)   . AKI (acute kidney injury) (HCC)   . Screen for STD (sexually transmitted disease)   . Acute respiratory failure (HCC) 06/08/2014  . Cardiogenic shock (HCC) 06/08/2014  . Acute on chronic systolic congestive heart failure (HCC) 06/08/2014  . Acute on chronic renal failure (HCC) 06/08/2014  . Hyperkalemia 06/08/2014  . Respiratory failure (HCC) 06/08/2014    Past Surgical History:  Procedure Laterality Date  . AV FISTULA PLACEMENT Right 06/27/2014   Procedure: ARTERIOVENOUS (AV) FISTULA CREATION VERSUS GRAFT INSERTION;  Surgeon: Pryor OchoaJames D Lawson, MD;  [facility];  Service: Vascular;  Laterality: Right;  . COLONOSCOPY    . FISTULA SUPERFICIALIZATION Right 10/25/2014   Procedure: RIGHT ARM RADIOCEPHALIC FISTULA SUPERFICIALIZATION;  Surgeon: Pryor OchoaJames D Lawson, MD;  [facility];  Service: Vascular;  Laterality: Right;  . INSERTION OF DIALYSIS CATHETER  12/15    Prior to Admission medications   Medication Sig Start Date End Date Taking? Authorizing Provider  amiodarone (PACERONE)  200 MG tablet Take 1 tablet (200 mg total) by mouth daily. 08/23/14   Amy D Filbert Schilder, NP  aspirin EC 81 MG EC tablet Take 1 tablet (81 mg total) by mouth daily. 05/05/16   Costin Otelia Sergeant, MD  atorvastatin (LIPITOR) 20 MG tablet Take 20 mg by mouth at bedtime.    Historical Provider, MD  hydrocortisone cream 1 % Apply 1 application topically 3 (three) times daily.    Historical Provider, MD  insulin NPH  Human (HUMULIN N,NOVOLIN N) 100 UNIT/ML injection Inject 0.12 mLs (12 Units total) into the skin at bedtime. 09/13/14   Margarita Grizzle, MD  insulin regular (NOVOLIN R,HUMULIN R) 100 units/mL injection Inject 0.02 mLs (2 Units total) into the skin 3 (three) times daily before meals. 05/05/16   Costin Otelia Sergeant, MD  sevelamer carbonate (RENVELA) 800 MG tablet Take 3 tablets (2,400 mg total) by mouth 3 (three) times daily with meals. 02/22/16   Almon Hercules, MD    No Known Allergies  No family history on file.  Social History Social History  Substance Use Topics  . Smoking status: Never Smoker  . Smokeless tobacco: Never Used  . Alcohol use No     Comment: last usuage 1 yr. ago    Review of Systems Constitutional: Negative for fever. Cardiovascular: Negative for chest pain. Respiratory: Positive for shortness of breath. Gastrointestinal: Negative for abdominal pain Neurological: Negative for headache 10-point ROS otherwise negative.  ____________________________________________   PHYSICAL EXAM:  Constitutional: Alert and oriented. Well appearing and in no distress. Eyes: Normal exam ENT   Head: Normocephalic and atraumatic   Mouth/Throat: Mucous membranes are moist. Cardiovascular: Normal rate, regular rhythm.  Respiratory: Normal respiratory effort without tachypnea nor retractions. Breath sounds are clear and no obvious wheezes, rales or rhonchi. Gastrointestinal: Soft and nontender. No distention.   Musculoskeletal: Nontender with normal range of motion in all extremities. Neurologic:  Normal speech and language. No gross focal neurologic deficits  Skin:  Skin is warm, dry and intact.  Psychiatric: Mood and affect are normal. Speech and behavior are normal.   ____________________________________________    EKG  EKG reviewed and interpreted by myself shows sinus tachycardia at 99 bpm, low amplitude QRS, normal axis, nonspecific ST changes. No ST  elevation.  ____________________________________________    RADIOLOGY  Chest x-ray shows cardiomegaly with mild vascular congestion mild increased bibasilar opacities atelectasis verse pneumonia.  ____________________________________________   INITIAL IMPRESSION / ASSESSMENT AND PLAN / ED COURSE  Pertinent labs & imaging results that were available during my care of the patient were reviewed by me and considered in my medical decision making (see chart for details).  The patient presents to the emergency department with shortness of breath. Patient is end-stage renal disease on hemodialysis. Denies any chest pain. We will check labs including cardiac enzymes, chest x-ray, and closely monitor in the emergency department. Patient agreeable plan.  Chest x-ray shows mild vascular congestion. Possible atelectasis versus pneumonia. As the patient has increased cough we will cover with Zithromax for possible infectious cause. Overall the patient appears well, satting 99-100 percent on 3 L which is his baseline. I discussed the patient with the nurse at [facility] health care who states the patient is scheduled for dialysis tomorrow morning. At this time with overall baseline appearing vitals and well-appearing labs I believe the patient will be safe for discharge home with Zithromax, cough medication as needed and follow-up tomorrow morning for dialysis. I discussed return precautions with the patient for  any increased trouble breathing or any chest pain. The patient is agreeable to this plan. Patient does have mild hypotension however in reviewing the patient's records it appears that his baseline is approximately 95/70. As this is very close to today's blood pressure we will discharge home with follow-up with his dialysis center tomorrow morning and strict return precautions. Patient agreeable.  ____________________________________________   FINAL CLINICAL IMPRESSION(S) / ED  DIAGNOSES  Dyspnea CHF exacerbation   Minna Antis, MD 06/12/16 1740

## 2016-06-12 NOTE — Discharge Instructions (Signed)
Please take antibiotic as prescribed for the next 4 days. Please take your cough medication as needed for discomfort. Please go to dialysis appointment tomorrow morning as scheduled. Return to the emergency department for any worsening trouble breathing, development of any chest pain, fever, or any other symptom personally concerning to your self.

## 2016-07-13 ENCOUNTER — Emergency Department: Payer: Medicare HMO

## 2016-07-13 ENCOUNTER — Encounter: Payer: Self-pay | Admitting: Emergency Medicine

## 2016-07-13 ENCOUNTER — Inpatient Hospital Stay
Admission: EM | Admit: 2016-07-13 | Discharge: 2016-07-30 | DRG: 291 | Disposition: A | Discharge status: Skilled Nursing Facility | Payer: Medicare HMO | Attending: Internal Medicine | Admitting: Internal Medicine

## 2016-07-13 DIAGNOSIS — E871 Hypo-osmolality and hyponatremia: Secondary | ICD-10-CM | POA: Diagnosis present

## 2016-07-13 DIAGNOSIS — E8779 Other fluid overload: Secondary | ICD-10-CM

## 2016-07-13 DIAGNOSIS — Z9119 Patient's noncompliance with other medical treatment and regimen: Secondary | ICD-10-CM

## 2016-07-13 DIAGNOSIS — K7689 Other specified diseases of liver: Secondary | ICD-10-CM | POA: Diagnosis not present

## 2016-07-13 DIAGNOSIS — N186 End stage renal disease: Secondary | ICD-10-CM

## 2016-07-13 DIAGNOSIS — I5023 Acute on chronic systolic (congestive) heart failure: Secondary | ICD-10-CM | POA: Diagnosis not present

## 2016-07-13 DIAGNOSIS — Z515 Encounter for palliative care: Secondary | ICD-10-CM

## 2016-07-13 DIAGNOSIS — Z7189 Other specified counseling: Secondary | ICD-10-CM | POA: Diagnosis not present

## 2016-07-13 DIAGNOSIS — I420 Dilated cardiomyopathy: Secondary | ICD-10-CM | POA: Diagnosis present

## 2016-07-13 DIAGNOSIS — Z66 Do not resuscitate: Secondary | ICD-10-CM | POA: Diagnosis present

## 2016-07-13 DIAGNOSIS — R5381 Other malaise: Secondary | ICD-10-CM | POA: Diagnosis present

## 2016-07-13 DIAGNOSIS — Z992 Dependence on renal dialysis: Secondary | ICD-10-CM | POA: Diagnosis not present

## 2016-07-13 DIAGNOSIS — I132 Hypertensive heart and chronic kidney disease with heart failure and with stage 5 chronic kidney disease, or end stage renal disease: Secondary | ICD-10-CM | POA: Diagnosis present

## 2016-07-13 DIAGNOSIS — E440 Moderate protein-calorie malnutrition: Secondary | ICD-10-CM | POA: Insufficient documentation

## 2016-07-13 DIAGNOSIS — E43 Unspecified severe protein-calorie malnutrition: Secondary | ICD-10-CM | POA: Diagnosis present

## 2016-07-13 DIAGNOSIS — E1121 Type 2 diabetes mellitus with diabetic nephropathy: Secondary | ICD-10-CM | POA: Diagnosis present

## 2016-07-13 DIAGNOSIS — I313 Pericardial effusion (noninflammatory): Secondary | ICD-10-CM | POA: Diagnosis present

## 2016-07-13 DIAGNOSIS — I5043 Acute on chronic combined systolic (congestive) and diastolic (congestive) heart failure: Secondary | ICD-10-CM | POA: Diagnosis present

## 2016-07-13 DIAGNOSIS — E785 Hyperlipidemia, unspecified: Secondary | ICD-10-CM | POA: Diagnosis present

## 2016-07-13 DIAGNOSIS — R570 Cardiogenic shock: Secondary | ICD-10-CM | POA: Diagnosis present

## 2016-07-13 DIAGNOSIS — Z794 Long term (current) use of insulin: Secondary | ICD-10-CM | POA: Diagnosis not present

## 2016-07-13 DIAGNOSIS — D631 Anemia in chronic kidney disease: Secondary | ICD-10-CM | POA: Diagnosis present

## 2016-07-13 DIAGNOSIS — Z9115 Patient's noncompliance with renal dialysis: Secondary | ICD-10-CM | POA: Diagnosis not present

## 2016-07-13 DIAGNOSIS — Z6829 Body mass index (BMI) 29.0-29.9, adult: Secondary | ICD-10-CM

## 2016-07-13 DIAGNOSIS — D638 Anemia in other chronic diseases classified elsewhere: Secondary | ICD-10-CM | POA: Diagnosis not present

## 2016-07-13 DIAGNOSIS — E11649 Type 2 diabetes mellitus with hypoglycemia without coma: Secondary | ICD-10-CM | POA: Diagnosis not present

## 2016-07-13 DIAGNOSIS — I4892 Unspecified atrial flutter: Secondary | ICD-10-CM | POA: Diagnosis present

## 2016-07-13 DIAGNOSIS — Z7982 Long term (current) use of aspirin: Secondary | ICD-10-CM

## 2016-07-13 DIAGNOSIS — J9601 Acute respiratory failure with hypoxia: Secondary | ICD-10-CM | POA: Diagnosis present

## 2016-07-13 DIAGNOSIS — L8990 Pressure ulcer of unspecified site, unspecified stage: Secondary | ICD-10-CM | POA: Insufficient documentation

## 2016-07-13 DIAGNOSIS — Z91158 Patient's noncompliance with renal dialysis for other reason: Secondary | ICD-10-CM

## 2016-07-13 DIAGNOSIS — Z79899 Other long term (current) drug therapy: Secondary | ICD-10-CM | POA: Diagnosis not present

## 2016-07-13 DIAGNOSIS — E1122 Type 2 diabetes mellitus with diabetic chronic kidney disease: Secondary | ICD-10-CM | POA: Diagnosis present

## 2016-07-13 DIAGNOSIS — R0602 Shortness of breath: Secondary | ICD-10-CM | POA: Diagnosis present

## 2016-07-13 DIAGNOSIS — J9600 Acute respiratory failure, unspecified whether with hypoxia or hypercapnia: Secondary | ICD-10-CM | POA: Diagnosis present

## 2016-07-13 DIAGNOSIS — R0600 Dyspnea, unspecified: Secondary | ICD-10-CM | POA: Diagnosis not present

## 2016-07-13 DIAGNOSIS — N2581 Secondary hyperparathyroidism of renal origin: Secondary | ICD-10-CM | POA: Diagnosis present

## 2016-07-13 DIAGNOSIS — I480 Paroxysmal atrial fibrillation: Secondary | ICD-10-CM | POA: Diagnosis present

## 2016-07-13 DIAGNOSIS — Z9114 Patient's other noncompliance with medication regimen: Secondary | ICD-10-CM

## 2016-07-13 DIAGNOSIS — I428 Other cardiomyopathies: Secondary | ICD-10-CM

## 2016-07-13 DIAGNOSIS — R627 Adult failure to thrive: Secondary | ICD-10-CM | POA: Diagnosis present

## 2016-07-13 DIAGNOSIS — R531 Weakness: Secondary | ICD-10-CM

## 2016-07-13 DIAGNOSIS — Z91199 Patient's noncompliance with other medical treatment and regimen due to unspecified reason: Secondary | ICD-10-CM

## 2016-07-13 DIAGNOSIS — L89152 Pressure ulcer of sacral region, stage 2: Secondary | ICD-10-CM | POA: Diagnosis present

## 2016-07-13 DIAGNOSIS — F1410 Cocaine abuse, uncomplicated: Secondary | ICD-10-CM | POA: Diagnosis present

## 2016-07-13 HISTORY — DX: Type 2 diabetes mellitus with unspecified complications: E11.8

## 2016-07-13 HISTORY — DX: Patient's noncompliance with other medical treatment and regimen due to unspecified reason: Z91.199

## 2016-07-13 HISTORY — DX: Paroxysmal atrial fibrillation: I48.0

## 2016-07-13 HISTORY — DX: End stage renal disease: N18.6

## 2016-07-13 HISTORY — DX: Patient's noncompliance with other medical treatment and regimen: Z91.19

## 2016-07-13 HISTORY — DX: Dependence on renal dialysis: Z99.2

## 2016-07-13 HISTORY — DX: Anemia in other chronic diseases classified elsewhere: D63.8

## 2016-07-13 LAB — COMPREHENSIVE METABOLIC PANEL
ALBUMIN: 2.7 g/dL — AB (ref 3.5–5.0)
ALT: 17 U/L (ref 17–63)
AST: 41 U/L (ref 15–41)
Alkaline Phosphatase: 96 U/L (ref 38–126)
Anion gap: 11 (ref 5–15)
BUN: 15 mg/dL (ref 6–20)
CO2: 32 mmol/L (ref 22–32)
CREATININE: 3.21 mg/dL — AB (ref 0.61–1.24)
Calcium: 8.6 mg/dL — ABNORMAL LOW (ref 8.9–10.3)
Chloride: 90 mmol/L — ABNORMAL LOW (ref 101–111)
GFR calc non Af Amer: 19 mL/min — ABNORMAL LOW (ref >60)
GFR, EST AFRICAN AMERICAN: 21 mL/min — AB (ref >60)
GLUCOSE: 77 mg/dL (ref 65–99)
Potassium: 3.3 mmol/L — ABNORMAL LOW (ref 3.5–5.1)
SODIUM: 133 mmol/L — AB (ref 135–145)
Total Bilirubin: 3.2 mg/dL — ABNORMAL HIGH (ref 0.3–1.2)
Total Protein: 9.2 g/dL — ABNORMAL HIGH (ref 6.5–8.1)

## 2016-07-13 LAB — CBC
HCT: 37.3 % — ABNORMAL LOW (ref 40.0–52.0)
HEMOGLOBIN: 12.3 g/dL — AB (ref 13.0–18.0)
MCH: 29.3 pg (ref 26.0–34.0)
MCHC: 33 g/dL (ref 32.0–36.0)
MCV: 88.6 fL (ref 80.0–100.0)
PLATELETS: 148 10*3/uL — AB (ref 150–440)
RBC: 4.21 MIL/uL — AB (ref 4.40–5.90)
RDW: 17.2 % — ABNORMAL HIGH (ref 11.5–14.5)
WBC: 5.7 10*3/uL (ref 3.8–10.6)

## 2016-07-13 LAB — MRSA PCR SCREENING: MRSA by PCR: NEGATIVE

## 2016-07-13 LAB — GLUCOSE, CAPILLARY: GLUCOSE-CAPILLARY: 108 mg/dL — AB (ref 65–99)

## 2016-07-13 LAB — TROPONIN I: Troponin I: <0.03 ng/mL (ref <0.03)

## 2016-07-13 LAB — MAGNESIUM: Magnesium: 2.1 mg/dL (ref 1.7–2.4)

## 2016-07-13 LAB — PHOSPHORUS: PHOSPHORUS: 1.9 mg/dL — AB (ref 2.5–4.6)

## 2016-07-13 MED ORDER — ORAL CARE MOUTH RINSE
15.0000 mL | Freq: Two times a day (BID) | OROMUCOSAL | Status: DC
  Administered 2016-07-13 – 2016-07-30 (×24): 15 mL via OROMUCOSAL

## 2016-07-13 MED ORDER — HYDROCORTISONE 1 % EX CREA
1.0000 "application " | TOPICAL_CREAM | Freq: Three times a day (TID) | CUTANEOUS | Status: DC
  Administered 2016-07-13 – 2016-07-30 (×33): 1 via TOPICAL
  Filled 2016-07-13 (×3): qty 28

## 2016-07-13 MED ORDER — SEVELAMER CARBONATE 800 MG PO TABS
2400.0000 mg | ORAL_TABLET | Freq: Three times a day (TID) | ORAL | Status: DC
  Administered 2016-07-14: 2400 mg via ORAL
  Filled 2016-07-13: qty 3

## 2016-07-13 MED ORDER — ASPIRIN EC 81 MG PO TBEC
81.0000 mg | DELAYED_RELEASE_TABLET | Freq: Every day | ORAL | Status: DC
  Administered 2016-07-14 – 2016-07-30 (×15): 81 mg via ORAL
  Filled 2016-07-13 (×16): qty 1

## 2016-07-13 MED ORDER — AMIODARONE HCL 200 MG PO TABS
200.0000 mg | ORAL_TABLET | Freq: Every day | ORAL | Status: DC
  Administered 2016-07-14 – 2016-07-30 (×16): 200 mg via ORAL
  Filled 2016-07-13 (×16): qty 1

## 2016-07-13 MED ORDER — HEPARIN SODIUM (PORCINE) 5000 UNIT/ML IJ SOLN
5000.0000 [IU] | Freq: Three times a day (TID) | INTRAMUSCULAR | Status: DC
  Administered 2016-07-13 – 2016-07-30 (×42): 5000 [IU] via SUBCUTANEOUS
  Filled 2016-07-13 (×44): qty 1

## 2016-07-13 MED ORDER — ATORVASTATIN CALCIUM 20 MG PO TABS
20.0000 mg | ORAL_TABLET | Freq: Every day | ORAL | Status: DC
  Administered 2016-07-13 – 2016-07-29 (×16): 20 mg via ORAL
  Filled 2016-07-13 (×18): qty 1

## 2016-07-13 MED ORDER — INSULIN DETEMIR 100 UNIT/ML ~~LOC~~ SOLN
12.0000 [IU] | Freq: Every day | SUBCUTANEOUS | Status: DC
  Administered 2016-07-13 – 2016-07-15 (×3): 12 [IU] via SUBCUTANEOUS
  Filled 2016-07-13 (×4): qty 0.12

## 2016-07-13 MED ORDER — SODIUM CHLORIDE 0.9% FLUSH
3.0000 mL | Freq: Two times a day (BID) | INTRAVENOUS | Status: DC
  Administered 2016-07-13 – 2016-07-20 (×15): 3 mL via INTRAVENOUS

## 2016-07-13 MED ORDER — GUAIFENESIN-CODEINE 100-10 MG/5ML PO SOLN
5.0000 mL | Freq: Four times a day (QID) | ORAL | Status: DC | PRN
  Administered 2016-07-14 – 2016-07-15 (×3): 5 mL via ORAL
  Filled 2016-07-13 (×3): qty 5

## 2016-07-13 MED ORDER — INSULIN ASPART 100 UNIT/ML ~~LOC~~ SOLN
0.0000 [IU] | Freq: Three times a day (TID) | SUBCUTANEOUS | Status: DC
  Administered 2016-07-14 – 2016-07-15 (×2): 2 [IU] via SUBCUTANEOUS
  Administered 2016-07-15: 1 [IU] via SUBCUTANEOUS
  Administered 2016-07-16: 2 [IU] via SUBCUTANEOUS
  Administered 2016-07-17: 1 [IU] via SUBCUTANEOUS
  Administered 2016-07-18 (×2): 2 [IU] via SUBCUTANEOUS
  Administered 2016-07-19: 1 [IU] via SUBCUTANEOUS
  Administered 2016-07-19 – 2016-07-20 (×3): 2 [IU] via SUBCUTANEOUS
  Administered 2016-07-21: 1 [IU] via SUBCUTANEOUS
  Administered 2016-07-21: 2 [IU] via SUBCUTANEOUS
  Administered 2016-07-22: 1 [IU] via SUBCUTANEOUS
  Administered 2016-07-22 (×2): 2 [IU] via SUBCUTANEOUS
  Administered 2016-07-23 – 2016-07-25 (×7): 1 [IU] via SUBCUTANEOUS
  Administered 2016-07-25: 3 [IU] via SUBCUTANEOUS
  Administered 2016-07-26 (×2): 1 [IU] via SUBCUTANEOUS
  Administered 2016-07-26: 2 [IU] via SUBCUTANEOUS
  Administered 2016-07-27 (×2): 1 [IU] via SUBCUTANEOUS
  Administered 2016-07-28 (×2): 2 [IU] via SUBCUTANEOUS
  Administered 2016-07-28: 3 [IU] via SUBCUTANEOUS
  Administered 2016-07-30 (×2): 2 [IU] via SUBCUTANEOUS
  Filled 2016-07-13: qty 3
  Filled 2016-07-13 (×3): qty 2
  Filled 2016-07-13: qty 1
  Filled 2016-07-13: qty 2
  Filled 2016-07-13: qty 1
  Filled 2016-07-13 (×2): qty 2
  Filled 2016-07-13: qty 1
  Filled 2016-07-13 (×3): qty 2
  Filled 2016-07-13 (×2): qty 1
  Filled 2016-07-13: qty 2
  Filled 2016-07-13 (×2): qty 1
  Filled 2016-07-13 (×2): qty 2
  Filled 2016-07-13: qty 1
  Filled 2016-07-13: qty 2
  Filled 2016-07-13 (×8): qty 1
  Filled 2016-07-13: qty 3
  Filled 2016-07-13 (×4): qty 2
  Filled 2016-07-13: qty 1
  Filled 2016-07-13: qty 2

## 2016-07-13 NOTE — ED Notes (Signed)
Unsuccessful IV attempts x 2 by this RN.

## 2016-07-13 NOTE — Plan of Care (Signed)
Problem: Skin Integrity: Goal: Risk for impaired skin integrity will decrease Outcome: Not Progressing Has stage II wound on coccyx present on admission.

## 2016-07-13 NOTE — H&P (Signed)
[facility] [place] [facility]   PATIENT NAME: [name]    MR#:  [id number]  DATE OF BIRTH:  [birth year]  DATE OF ADMISSION:  07/13/2016  PRIMARY CARE PHYSICIAN: Dimple Casey, MD   REQUESTING/REFERRING PHYSICIAN: Cyril Loosen  CHIEF COMPLAINT:   Chief Complaint  Patient presents with  . Testicle Pain    HISTORY OF PRESENT ILLNESS: [name]  is a 65 y.o. male with a known history of Chronic anemia, severe cardiomyopathy with ejection fraction less than 15%, chronic kidney disease and ESRD on hemodialysis, diabetes, there abuse, hyperlipidemia, hypertension- claims to be compliant with his dialysis in his medications. He started noticing generalized swelling and edema for last few days. Also feeling short of breath so came to emergency room. In ER he was hypoxic and requiring 3-4 L of oxygen to maintain saturation up to 90%. And he has severe the swallow and legs thighs and scrotum abdominal wall, and his blood pressure is running on the lower normal side because of very low EF. ER physician spoke to her nephrologist and he suggested the patient might need to daily dialysis for next few days so suggested to admit to hospital.  PAST MEDICAL HISTORY:   Past Medical History:  Diagnosis Date  . Anemia   . Apical mural thrombus   . Arthritis   . Atrial fibrillation, chronic (HCC)   . Chronic systolic heart failure (HCC)    a) ECHO [facility] 02/2014): EF <15%, multiple apical thrombi, RV mod enlarged, mod MR b. RHC (02/21/14) at [facility]: RA 16, RV 64/9 (15), PA 64/32 (42), PCWP: 24, CI: 1.4  . CKD (chronic kidney disease), stage III   . Diabetes mellitus without complication (HCC)   . Drug use    cocaine  . HLD (hyperlipidemia)   . HTN (hypertension)   . Intrahepatic cholestasis 02/20/2016  . Mitral regurgitation   . Pneumonia     PAST SURGICAL HISTORY: Past Surgical History:  Procedure Laterality Date  . AV FISTULA PLACEMENT Right 06/27/2014   Procedure:  ARTERIOVENOUS (AV) FISTULA CREATION VERSUS GRAFT INSERTION;  Surgeon: Pryor Ochoa, MD;  Location: [facility] OR;  Service: Vascular;  Laterality: Right;  . COLONOSCOPY    . FISTULA SUPERFICIALIZATION Right 10/25/2014   Procedure: RIGHT ARM RADIOCEPHALIC FISTULA SUPERFICIALIZATION;  Surgeon: Pryor Ochoa, MD;  Location: [facility] OR;  Service: Vascular;  Laterality: Right;  . INSERTION OF DIALYSIS CATHETER  12/15    SOCIAL HISTORY:  Social History  Substance Use Topics  . Smoking status: Never Smoker  . Smokeless tobacco: Never Used  . Alcohol use No     Comment: last usuage 1 yr. ago    FAMILY HISTORY:  Family History  Problem Relation Age of Onset  . Hypertension Mother     DRUG ALLERGIES: No Known Allergies  REVIEW OF SYSTEMS:   CONSTITUTIONAL: No fever, fatigue or weakness.  EYES: No blurred or double vision.  EARS, NOSE, AND THROAT: No tinnitus or ear pain.  RESPIRATORY: No cough, positive for shortness of breath, no wheezing or hemoptysis.  CARDIOVASCULAR: No chest pain, positive for orthopnea, edema.  GASTROINTESTINAL: No nausea, vomiting, diarrhea or abdominal pain.  GENITOURINARY: No dysuria, hematuria.  ENDOCRINE: No polyuria, nocturia,  HEMATOLOGY: No anemia, easy bruising or bleeding SKIN: No rash or lesion. MUSCULOSKELETAL: No joint pain or arthritis.   NEUROLOGIC: No tingling, numbness, weakness.  PSYCHIATRY: No anxiety or depression.   MEDICATIONS AT HOME:  Prior to Admission medications   Medication Sig  Start Date End Date Taking? Authorizing Provider  acetaminophen (TYLENOL) 325 MG tablet Take 650 mg by mouth every 6 (six) hours as needed for mild pain or moderate pain.    Historical Provider, MD  amiodarone (PACERONE) 200 MG tablet Take 1 tablet (200 mg total) by mouth daily. 08/23/14   Amy D Filbert Schilderlegg, NP  aspirin EC 81 MG EC tablet Take 1 tablet (81 mg total) by mouth daily. 05/05/16   Costin Otelia SergeantM Gherghe, MD  atorvastatin (LIPITOR) 20 MG tablet Take 20 mg by mouth at  bedtime.    Historical Provider, MD  azithromycin (ZITHROMAX) 250 MG tablet Take 1 tablet (250 mg total) by mouth daily. 06/12/16   Minna AntisKevin Paduchowski, MD  guaiFENesin-codeine 100-10 MG/5ML syrup Take 5 mLs by mouth every 6 (six) hours as needed for cough. 06/12/16   Minna AntisKevin Paduchowski, MD  hydrocortisone cream 1 % Apply 1 application topically 3 (three) times daily.    Historical Provider, MD  insulin aspart (NOVOLOG) 100 UNIT/ML injection Inject 3 Units into the skin 3 (three) times daily before meals.    Historical Provider, MD  insulin NPH Human (HUMULIN N,NOVOLIN N) 100 UNIT/ML injection Inject 0.12 mLs (12 Units total) into the skin at bedtime. 09/13/14   Margarita Grizzleanielle Ray, MD  sevelamer carbonate (RENVELA) 800 MG tablet Take 3 tablets (2,400 mg total) by mouth 3 (three) times daily with meals. 02/22/16   Almon Herculesaye T Gonfa, MD      PHYSICAL EXAMINATION:   VITAL SIGNS: Blood pressure (!) 82/61, pulse 95, temperature 97.7 F (36.5 C), temperature source Oral, resp. rate 20, height 6' (1.829 m), weight 127 kg (280 lb), SpO2 100 %.  GENERAL:  65 y.o.-year-old patient lying in the bed with no acute distress.  EYES: Pupils equal, round, reactive to light and accommodation. No scleral icterus. Extraocular muscles intact.  HEENT: Head atraumatic, normocephalic. Oropharynx and nasopharynx clear.  NECK:  Supple, no jugular venous distention. No thyroid enlargement, no tenderness.  LUNGS: Normal breath sounds bilaterally, no wheezing, some crepitation. Positive use of accessory muscles of respiration. Requiring supplemental oxygen. CARDIOVASCULAR: S1, S2 normal. No murmurs, rubs, or gallops.  ABDOMEN: Soft, nontender, nondistended. Bowel sounds present. No organomegaly or mass.  EXTREMITIES: Bilateral severe pedal edema extending on thighs, scrotum, lower abdominal wall, no cyanosis, or clubbing.  NEUROLOGIC: Cranial nerves II through XII are intact. Muscle strength 5/5 in all extremities. Sensation intact. Gait  not checked.  PSYCHIATRIC: The patient is alert and oriented x 3.  SKIN: No obvious rash, lesion, or ulcer.   LABORATORY PANEL:   CBC  Recent Labs Lab 07/13/16 1645  WBC 5.7  HGB 12.3*  HCT 37.3*  PLT 148*  MCV 88.6  MCH 29.3  MCHC 33.0  RDW 17.2*   ------------------------------------------------------------------------------------------------------------------  Chemistries   Recent Labs Lab 07/13/16 1645  NA 133*  K 3.3*  CL 90*  CO2 32  GLUCOSE 77  BUN 15  CREATININE 3.21*  CALCIUM 8.6*  MG 2.1  AST 41  ALT 17  ALKPHOS 96  BILITOT 3.2*   ------------------------------------------------------------------------------------------------------------------ estimated creatinine clearance is 30.8 mL/min (by C-G formula based on SCr of 3.21 mg/dL (H)). ------------------------------------------------------------------------------------------------------------------ No results for input(s): TSH, T4TOTAL, T3FREE, THYROIDAB in the last 72 hours.  Invalid input(s): FREET3   Coagulation profile No results for input(s): INR, PROTIME in the last 168 hours. ------------------------------------------------------------------------------------------------------------------- No results for input(s): DDIMER in the last 72 hours. -------------------------------------------------------------------------------------------------------------------  Cardiac Enzymes  Recent Labs Lab 07/13/16 1645  TROPONINI <0.03   ------------------------------------------------------------------------------------------------------------------  Invalid input(s): POCBNP  ---------------------------------------------------------------------------------------------------------------  Urinalysis    Component Value Date/Time   COLORURINE RED (A) 09/13/2014 0952   APPEARANCEUR HAZY (A) 09/13/2014 0952   LABSPEC 1.025 09/13/2014 0952   PHURINE 5.0 09/13/2014 0952   GLUCOSEU 100 (A) 09/13/2014  0952   HGBUR NEGATIVE 09/13/2014 0952   BILIRUBINUR LARGE (A) 09/13/2014 0952   KETONESUR 15 (A) 09/13/2014 0952   PROTEINUR 30 (A) 09/13/2014 0952   UROBILINOGEN 2.0 (H) 09/13/2014 0952   NITRITE POSITIVE (A) 09/13/2014 0952   LEUKOCYTESUR SMALL (A) 09/13/2014 0952     RADIOLOGY: Dg Chest Port 1 View  Result Date: 07/13/2016 CLINICAL DATA:  65 y/o  M; shortness of breath. EXAM: PORTABLE CHEST 1 VIEW COMPARISON:  06/12/2016 chest radiograph and 06/10/2014. FINDINGS: Severe cardiomegaly much increased in size from 2015, question pericardial effusion. Small left and trace right pleural effusions. Associated basilar opacities may represent atelectasis or pneumonia. No significant interval change. Bones are unremarkable. AICD lead noted. IMPRESSION: 1. Severe cardiomegaly much increased in size from 2015, question pericardial effusion. 2. Small left and trace right pleural effusions with associated basilar opacities are stable and may represent atelectasis or pneumonia. Electronically Signed   By: Mitzi Hansen M.D.   On: 07/13/2016 17:27    EKG: Orders placed or performed during the hospital encounter of 07/13/16  . ED EKG  . ED EKG  . EKG 12-Lead  . EKG 12-Lead    IMPRESSION AND PLAN:  * Acute on chronic systolic congestive heart failure   Acute hypoxic respiratory failure   Anasarca with end-stage renal disease.    Nephrology consult for dialysis.   Cardiology consult for help.   Continue baseline cardiac medications.  * Hypotension   With extremely low EF his blood pressure is running up to 80s systolic but patient is currently alert and oriented so he would be stable to be monitored on telemetry floor.  * Diabetes   Continue basal NPH and keep on sliding scale coverage.  All the records are reviewed and case discussed with ED provider. Management plans discussed with the patient, family and they are in agreement.  CODE STATUS: DO NOT RESUSCITATE Code Status  History    Date Active Date Inactive Code Status Order ID Comments User Context   05/22/2016  2:49 AM 05/23/2016  7:07 PM Full Code [id number]  Gwendolyn Fill, NP ED   05/03/2016 12:21 AM 05/06/2016  9:49 PM Full Code [id number]  Michael Litter, MD Inpatient   02/17/2016  7:38 PM 02/22/2016 11:44 PM DNR [id number]  Renne Musca, MD Inpatient   06/22/2014  2:36 PM 06/28/2014  8:24 PM Full Code [id number]  Berdine Dance, MD Inpatient   06/08/2014 12:10 PM 06/22/2014  2:36 PM Full Code [id number]  Alyson Reedy, MD ED     I confirmed with patient about his wishes for resuscitation and he confirms DO NOT RESUSCITATE.   He says his power of attorney would be his daughter who lives in [place] and she is aware about his wishes.  TOTAL TIME TAKING CARE OF THIS PATIENT: 50 minutes.    Altamese Dilling M.D on 07/13/2016   Between 7am to 6pm - Pager - [phone]  After 6pm go to [email or web address] - password [email or web address]  [facility]  Office  [phone]  CC: Primary care physician; Dimple Casey, MD   Note: This dictation was prepared with Dragon dictation along with smaller phrase technology. Any transcriptional errors that result from this process  are unintentional.

## 2016-07-13 NOTE — ED Triage Notes (Signed)
Pt presents to ED via [facility] with c/o bilateral testicle swelling, reports noticed swelling yesterday. Pt has dialysis MWF. Pt completed dialysis today. Pt alert and oriented, labored breathing.

## 2016-07-13 NOTE — ED Notes (Signed)
Admitting MD at bedside.

## 2016-07-13 NOTE — ED Notes (Addendum)
[name] Cooter RN called the admitting MD Dr. Hoyle Sauer to inquire about parameter orders for the Pt's blood pressure and she order the Pt to be admitted to stepdown instead of telemetry.

## 2016-07-13 NOTE — ED Provider Notes (Addendum)
[facility] Emergency Department Provider Note   ____________________________________________    I have reviewed the triage vital signs and the nursing notes.   HISTORY  Chief Complaint Swelling and shortness of breath    HPI [name] is a 65 y.o. male history of multiple medical issues including end-stage renal disease presents today with significant lower show any swelling and shortness of breath. Patient did receive dialysis today. He denies chest pain but reports difficulty breathing. He reports swollen scrotum as well which is new but denies pain or redness. His nephrologist is a Dr. Kathrene Bongo   Past Medical History:  Diagnosis Date  . Anemia   . Apical mural thrombus   . Arthritis   . Atrial fibrillation, chronic (HCC)   . Chronic systolic heart failure (HCC)    a) ECHO [facility] 02/2014): EF <15%, multiple apical thrombi, RV mod enlarged, mod MR b. RHC (02/21/14) at [facility]: RA 16, RV 64/9 (15), PA 64/32 (42), PCWP: 24, CI: 1.4  . CKD (chronic kidney disease), stage III   . Diabetes mellitus without complication (HCC)   . Drug use    cocaine  . HLD (hyperlipidemia)   . HTN (hypertension)   . Intrahepatic cholestasis 02/20/2016  . Mitral regurgitation   . Pneumonia     Patient Active Problem List   Diagnosis Date Noted  . ESRD (end stage renal disease) on dialysis (HCC) 05/03/2016  . Generalized weakness 05/02/2016  . Intrahepatic cholestasis 02/20/2016  . Direct hyperbilirubinemia   . Paroxysmal atrial fibrillation (HCC)   . Shortness of breath   . Type 2 diabetes mellitus with diabetic nephropathy, without long-term current use of insulin (HCC)   . End stage renal disease (HCC) 12/25/2014  . Atrial fibrillation [I48.91] 08/09/2014  . Encounter for therapeutic drug monitoring 08/09/2014  . Chronic systolic heart failure (HCC) 07/04/2014  . ESRD (end stage renal disease) (HCC) 07/04/2014  . PAF (paroxysmal atrial fibrillation)  (HCC) 07/04/2014  . Controlled diabetes mellitus type II without complication (HCC) 06/22/2014  . Gout 06/22/2014  . ESRD needing dialysis (HCC)   . Pulmonary edema   . Bacteremia associated with intravascular line (HCC)   . Blood poisoning (HCC)   . Acute respiratory failure with hypoxia (HCC)   . AKI (acute kidney injury) (HCC)   . Screen for STD (sexually transmitted disease)   . Acute respiratory failure (HCC) 06/08/2014  . Cardiogenic shock (HCC) 06/08/2014  . Acute on chronic systolic congestive heart failure (HCC) 06/08/2014  . Acute on chronic renal failure (HCC) 06/08/2014  . Hyperkalemia 06/08/2014  . Respiratory failure (HCC) 06/08/2014    Past Surgical History:  Procedure Laterality Date  . AV FISTULA PLACEMENT Right 06/27/2014   Procedure: ARTERIOVENOUS (AV) FISTULA CREATION VERSUS GRAFT INSERTION;  Surgeon: Pryor Ochoa, MD;  Location: [facility] OR;  Service: Vascular;  Laterality: Right;  . COLONOSCOPY    . FISTULA SUPERFICIALIZATION Right 10/25/2014   Procedure: RIGHT ARM RADIOCEPHALIC FISTULA SUPERFICIALIZATION;  Surgeon: Pryor Ochoa, MD;  Location: [facility] OR;  Service: Vascular;  Laterality: Right;  . INSERTION OF DIALYSIS CATHETER  12/15    Prior to Admission medications   Medication Sig Start Date End Date Taking? Authorizing Provider  acetaminophen (TYLENOL) 325 MG tablet Take 650 mg by mouth every 6 (six) hours as needed for mild pain or moderate pain.    Historical Provider, MD  amiodarone (PACERONE) 200 MG tablet Take 1 tablet (200 mg total) by mouth daily. 08/23/14  Amy Georgie Chard, NP  aspirin EC 81 MG EC tablet Take 1 tablet (81 mg total) by mouth daily. 05/05/16   Costin Otelia Sergeant, MD  atorvastatin (LIPITOR) 20 MG tablet Take 20 mg by mouth at bedtime.    Historical Provider, MD  azithromycin (ZITHROMAX) 250 MG tablet Take 1 tablet (250 mg total) by mouth daily. 06/12/16   Minna Antis, MD  guaiFENesin-codeine 100-10 MG/5ML syrup Take 5 mLs by mouth every 6  (six) hours as needed for cough. 06/12/16   Minna Antis, MD  hydrocortisone cream 1 % Apply 1 application topically 3 (three) times daily.    Historical Provider, MD  insulin aspart (NOVOLOG) 100 UNIT/ML injection Inject 3 Units into the skin 3 (three) times daily before meals.    Historical Provider, MD  insulin NPH Human (HUMULIN N,NOVOLIN N) 100 UNIT/ML injection Inject 0.12 mLs (12 Units total) into the skin at bedtime. 09/13/14   Margarita Grizzle, MD  sevelamer carbonate (RENVELA) 800 MG tablet Take 3 tablets (2,400 mg total) by mouth 3 (three) times daily with meals. 02/22/16   Almon Hercules, MD     Allergies Patient has no known allergies.  No family history on file.  Social History Social History  Substance Use Topics  . Smoking status: Never Smoker  . Smokeless tobacco: Never Used  . Alcohol use No     Comment: last usuage 1 yr. ago    Review of Systems  Constitutional: No fever/chills  ENT: No sore throat. Cardiovascular: Denies chest pain. Respiratory: Positive shortness of breath Gastrointestinal: No abdominal pain.   Genitourinary: Negative for dysuria. Negative for redness, positive for swelling Musculoskeletal: Negative for back pain. Skin: Negative for rash. Neurological: Negative for headaches or weakness  10-point ROS otherwise negative.  ____________________________________________   PHYSICAL EXAM:  VITAL SIGNS: ED Triage Vitals  Enc Vitals Group     BP 07/13/16 1621 (!) 84/58     Pulse Rate 07/13/16 1621 90     Resp 07/13/16 1621 20     Temp 07/13/16 1621 97.7 F (36.5 C)     Temp Source 07/13/16 1621 Oral     SpO2 07/13/16 1621 (!) 80 %     Weight 07/13/16 1628 280 lb (127 kg)     Height 07/13/16 1628 6' (1.829 m)     Head Circumference --      Peak Flow --      Pain Score --      Pain Loc --      Pain Edu? --      Excl. in GC? --     Constitutional: Alert and oriented.  Eyes: Conjunctivae are normal.  Head: Atraumatic. Nose: No  congestion/rhinnorhea. Mouth/Throat: Mucous membranes are moist.   Cardiovascular: Normal rate, . Grossly normal heart sounds.  Good peripheral circulation. Respiratory: Normal respiratory effort.  No retractions. Lungs CTAB. Gastrointestinal: Soft and nontender. No distention.  No CVA tenderness. Genitourinary: Significant edema of the scrotum, cantaloupe sized, no erythema or crepitus Musculoskeletal: 3+ edema to the level of the groin.  Warm and well perfused Neurologic:  Normal speech and language. No gross focal neurologic deficits are appreciated.  Skin:  Skin is warm, dry and intact. No rash noted. Psychiatric: Mood and affect are normal. Speech and behavior are normal.  ____________________________________________   LABS (all labs ordered are listed, but only abnormal results are displayed)  Labs Reviewed  CBC  COMPREHENSIVE METABOLIC PANEL  TROPONIN I  PHOSPHORUS  MAGNESIUM   ____________________________________________  EKG  ED ECG REPORT I, Jene Every, the attending physician, personally viewed and interpreted this ECG.  Date: 07/13/2016 EKG Time: 5:18 PM  Rhythm: sinus rhythm QRS Axis: normal Intervals: normal ST/T Wave abnormalities: non specific Conduction Disturbances: IVCD   ____________________________________________  RADIOLOGY  Cardiomegaly and pleural effusions on chest x-ray ____________________________________________   PROCEDURES  Procedure(s) performed: No    Critical Care performed: yes  CRITICAL CARE Performed by: Jene Every   Total critical care time: 30 minutes  Critical care time was exclusive of separately billable procedures and treating other patients.  Critical care was necessary to treat or prevent imminent or life-threatening deterioration.  Critical care was time spent personally by me on the following activities: development of treatment plan with patient and/or surrogate as well as nursing, discussions  with consultants, evaluation of patient's response to treatment, examination of patient, obtaining history from patient or surrogate, ordering and performing treatments and interventions, ordering and review of laboratory studies, ordering and review of radiographic studies, pulse oximetry and re-evaluation of patient's condition.  ____________________________________________   INITIAL IMPRESSION / ASSESSMENT AND PLAN / ED COURSE  Pertinent labs & imaging results that were available during my care of the patient were reviewed by me and considered in my medical decision making (see chart for details).  Patient presents with impressive amount of lower extremity and scrotal edema, suspect fluid overload as the cause of the shortness of breath/hypoxia. Despite having received dialysis today. Check labs, chest x-ray and reevaluate and carefully monitor. Hypoxia is improved with O2. Patient's blood pressure is 84/73, his baseline pressure is typically 90/76 so this is actually not too far off, we will monitor closely  Clinical Course   Discussed with nephrologist he will see the patient  Will admit to the hospitalist service ____________________________________________   FINAL CLINICAL IMPRESSION(S) / ED DIAGNOSES  Final diagnoses:  Acute respiratory failure with hypoxia (HCC)  Acute on chronic systolic congestive heart failure (HCC)  Other hypervolemia      NEW MEDICATIONS STARTED DURING THIS VISIT:  New Prescriptions   No medications on file     Note:  This document was prepared using Dragon voice recognition software and may include unintentional dictation errors.    Jene Every, MD 07/13/16 1750    Jene Every, MD 07/13/16 [phone]

## 2016-07-14 ENCOUNTER — Encounter: Payer: Self-pay | Admitting: Physician Assistant

## 2016-07-14 ENCOUNTER — Inpatient Hospital Stay (HOSPITAL_COMMUNITY)
Admit: 2016-07-14 | Discharge: 2016-07-14 | Disposition: A | Discharge status: Home / Self Care | Payer: Medicare HMO | Attending: Physician Assistant | Admitting: Physician Assistant

## 2016-07-14 DIAGNOSIS — L8990 Pressure ulcer of unspecified site, unspecified stage: Secondary | ICD-10-CM | POA: Insufficient documentation

## 2016-07-14 DIAGNOSIS — Z9119 Patient's noncompliance with other medical treatment and regimen: Secondary | ICD-10-CM

## 2016-07-14 DIAGNOSIS — N186 End stage renal disease: Secondary | ICD-10-CM

## 2016-07-14 DIAGNOSIS — K7689 Other specified diseases of liver: Secondary | ICD-10-CM

## 2016-07-14 DIAGNOSIS — Z91199 Patient's noncompliance with other medical treatment and regimen due to unspecified reason: Secondary | ICD-10-CM

## 2016-07-14 DIAGNOSIS — D638 Anemia in other chronic diseases classified elsewhere: Secondary | ICD-10-CM | POA: Diagnosis present

## 2016-07-14 DIAGNOSIS — Z9115 Patient's noncompliance with renal dialysis: Secondary | ICD-10-CM

## 2016-07-14 DIAGNOSIS — E1121 Type 2 diabetes mellitus with diabetic nephropathy: Secondary | ICD-10-CM

## 2016-07-14 DIAGNOSIS — R0600 Dyspnea, unspecified: Secondary | ICD-10-CM

## 2016-07-14 DIAGNOSIS — I428 Other cardiomyopathies: Secondary | ICD-10-CM

## 2016-07-14 DIAGNOSIS — I5023 Acute on chronic systolic (congestive) heart failure: Secondary | ICD-10-CM

## 2016-07-14 DIAGNOSIS — E440 Moderate protein-calorie malnutrition: Secondary | ICD-10-CM | POA: Insufficient documentation

## 2016-07-14 DIAGNOSIS — E43 Unspecified severe protein-calorie malnutrition: Secondary | ICD-10-CM | POA: Diagnosis present

## 2016-07-14 DIAGNOSIS — J9601 Acute respiratory failure with hypoxia: Secondary | ICD-10-CM

## 2016-07-14 DIAGNOSIS — Z992 Dependence on renal dialysis: Secondary | ICD-10-CM

## 2016-07-14 DIAGNOSIS — I480 Paroxysmal atrial fibrillation: Secondary | ICD-10-CM

## 2016-07-14 LAB — CBC
HEMATOCRIT: 34.7 % — AB (ref 40.0–52.0)
Hemoglobin: 11.4 g/dL — ABNORMAL LOW (ref 13.0–18.0)
MCH: 29.6 pg (ref 26.0–34.0)
MCHC: 32.8 g/dL (ref 32.0–36.0)
MCV: 90.3 fL (ref 80.0–100.0)
Platelets: 139 10*3/uL — ABNORMAL LOW (ref 150–440)
RBC: 3.85 MIL/uL — ABNORMAL LOW (ref 4.40–5.90)
RDW: 17.2 % — AB (ref 11.5–14.5)
WBC: 6.1 10*3/uL (ref 3.8–10.6)

## 2016-07-14 LAB — BASIC METABOLIC PANEL
Anion gap: 8 (ref 5–15)
BUN: 18 mg/dL (ref 6–20)
CHLORIDE: 92 mmol/L — AB (ref 101–111)
CO2: 34 mmol/L — AB (ref 22–32)
Calcium: 8.3 mg/dL — ABNORMAL LOW (ref 8.9–10.3)
Creatinine, Ser: 3.73 mg/dL — ABNORMAL HIGH (ref 0.61–1.24)
GFR calc Af Amer: 18 mL/min — ABNORMAL LOW (ref >60)
GFR calc non Af Amer: 15 mL/min — ABNORMAL LOW (ref >60)
GLUCOSE: 127 mg/dL — AB (ref 65–99)
POTASSIUM: 3.1 mmol/L — AB (ref 3.5–5.1)
Sodium: 134 mmol/L — ABNORMAL LOW (ref 135–145)

## 2016-07-14 LAB — GLUCOSE, CAPILLARY
GLUCOSE-CAPILLARY: 167 mg/dL — AB (ref 65–99)
GLUCOSE-CAPILLARY: 162 mg/dL — AB (ref 65–99)
Glucose-Capillary: 80 mg/dL (ref 65–99)
Glucose-Capillary: 96 mg/dL (ref 65–99)

## 2016-07-14 LAB — PHOSPHORUS: PHOSPHORUS: 3 mg/dL (ref 2.5–4.6)

## 2016-07-14 LAB — MAGNESIUM: MAGNESIUM: 2.2 mg/dL (ref 1.7–2.4)

## 2016-07-14 MED ORDER — DIPHENHYDRAMINE HCL 50 MG/ML IJ SOLN
INTRAMUSCULAR | Status: AC
  Filled 2016-07-14: qty 1

## 2016-07-14 MED ORDER — ENSURE ENLIVE PO LIQD
237.0000 mL | Freq: Two times a day (BID) | ORAL | Status: DC
  Administered 2016-07-14 – 2016-07-20 (×13): 237 mL via ORAL

## 2016-07-14 MED ORDER — MILRINONE LACTATE IN DEXTROSE 20-5 MG/100ML-% IV SOLN
0.2500 ug/kg/min | INTRAVENOUS | Status: DC
  Administered 2016-07-14 (×2): 0.25 ug/kg/min via INTRAVENOUS
  Filled 2016-07-14 (×3): qty 100

## 2016-07-14 MED ORDER — POTASSIUM CHLORIDE CRYS ER 20 MEQ PO TBCR
40.0000 meq | EXTENDED_RELEASE_TABLET | ORAL | Status: AC
  Administered 2016-07-14 (×2): 40 meq via ORAL
  Filled 2016-07-14 (×2): qty 2

## 2016-07-14 MED ORDER — DOPAMINE-DEXTROSE 3.2-5 MG/ML-% IV SOLN
2.0000 ug/kg/min | INTRAVENOUS | Status: DC
Start: 2016-07-14 — End: 2016-07-15
  Filled 2016-07-14: qty 250

## 2016-07-14 MED ORDER — DIPHENHYDRAMINE HCL 50 MG/ML IJ SOLN
25.0000 mg | Freq: Once | INTRAMUSCULAR | Status: AC
  Administered 2016-07-14: 25 mg via INTRAVENOUS

## 2016-07-14 MED ORDER — ALBUMIN HUMAN 25 % IV SOLN
25.0000 g | Freq: Once | INTRAVENOUS | Status: AC
  Administered 2016-07-14: 25 g via INTRAVENOUS
  Filled 2016-07-14: qty 100

## 2016-07-14 NOTE — Progress Notes (Signed)
Chaplain rounded the unit to provide a compassionate presence and support to the patient. Patient appeared to be sleeping.  No visitors were at the bedside. Chaplain provided silent prayer. Chaplain Eara Burruel [phone] 

## 2016-07-14 NOTE — Progress Notes (Signed)
Pre Dialysis 

## 2016-07-14 NOTE — Progress Notes (Addendum)
[facility] [place] [facility]   PATIENT NAME: [name]    MR#:  [id number]  DATE OF BIRTH:  Jan 15, 1951  SUBJECTIVE:  Came with increasing SOB and edmea-generalized  REVIEW OF SYSTEMS:   Review of Systems  Constitutional: Negative for chills, fever and weight loss.  HENT: Negative for ear discharge, ear pain and nosebleeds.   Eyes: Negative for blurred vision, pain and discharge.  Respiratory: Positive for shortness of breath. Negative for sputum production, wheezing and stridor.   Cardiovascular: Positive for orthopnea, leg swelling and PND. Negative for chest pain and palpitations.  Gastrointestinal: Negative for abdominal pain, diarrhea, nausea and vomiting.  Genitourinary: Negative for frequency and urgency.  Musculoskeletal: Negative for back pain and joint pain.  Neurological: Positive for weakness. Negative for sensory change, speech change and focal weakness.  Psychiatric/Behavioral: Negative for depression and hallucinations. The patient is not nervous/anxious.    Tolerating Diet: Tolerating PT:   DRUG ALLERGIES:  No Known Allergies  VITALS:  Blood pressure 96/68, pulse 100, temperature 97.6 F (36.4 C), temperature source Axillary, resp. rate (!) 27, height 6' (1.829 m), weight 119.7 kg (263 lb 14.3 oz), SpO2 93 %.  PHYSICAL EXAMINATION:   Physical Exam  GENERAL:  65 y.o.-year-old patient lying in the bed with no acute distress. Chronically ill, Anasarca EYES: Pupils equal, round, reactive to light and accommodation. No scleral icterus. Extraocular muscles intact.  HEENT: Head atraumatic, normocephalic. Oropharynx and nasopharynx clear.  NECK:  Supple, no jugular venous distention. No thyroid enlargement, no tenderness.  LUNGS: decreased breath sounds bilaterally, no wheezing, rales, rhonchi. No use of accessory muscles of respiration.  CARDIOVASCULAR: S1, S2 normal. No murmurs, rubs, or gallops.  ABDOMEN: Soft, nontender,  nondistended. Bowel sounds present. No organomegaly or mass.  EXTREMITIES: No cyanosis, clubbing  ++++ edema b/l.    NEUROLOGIC: Cranial nerves II through XII are intact. No focal Motor or sensory deficits b/l.   PSYCHIATRIC:  patient is alert and oriented x 3.  SKIN: No obvious rash, lesion, or ulcer.   LABORATORY PANEL:  CBC  Recent Labs Lab 07/14/16 0353  WBC 6.1  HGB 11.4*  HCT 34.7*  PLT 139*    Chemistries   Recent Labs Lab 07/13/16 1645 07/14/16 0353 07/14/16 1500  NA 133* 134*  --   K 3.3* 3.1*  --   CL 90* 92*  --   CO2 32 34*  --   GLUCOSE 77 127*  --   BUN 15 18  --   CREATININE 3.21* 3.73*  --   CALCIUM 8.6* 8.3*  --   MG 2.1  --  2.2  AST 41  --   --   ALT 17  --   --   ALKPHOS 96  --   --   BILITOT 3.2*  --   --    Cardiac Enzymes  Recent Labs Lab 07/13/16 1645  TROPONINI <0.03   RADIOLOGY:  Dg Chest Port 1 View  Result Date: 07/13/2016 CLINICAL DATA:  65 y/o  M; shortness of breath. EXAM: PORTABLE CHEST 1 VIEW COMPARISON:  06/12/2016 chest radiograph and 06/10/2014. FINDINGS: Severe cardiomegaly much increased in size from 2015, question pericardial effusion. Small left and trace right pleural effusions. Associated basilar opacities may represent atelectasis or pneumonia. No significant interval change. Bones are unremarkable. AICD lead noted. IMPRESSION: 1. Severe cardiomegaly much increased in size from 2015, question pericardial effusion. 2. Small left and trace right pleural effusions with associated basilar opacities  are stable and may represent atelectasis or pneumonia. Electronically Signed   By: Mitzi Hansen M.D.   On: 07/13/2016 17:27   ASSESSMENT AND PLAN:   [name]  is a 65 y.o. male with a known history of Chronic anemia, severe cardiomyopathy with ejection fraction less than 15%, chronic kidney disease and ESRD on hemodialysis, diabetes, there abuse, hyperlipidemia, hypertension- claims to be compliant with his  dialysis in his medications. He started noticing generalized swelling and edema for last few days. Also feeling short of breath so came to emergency room.  * Acute on chronic systolic congestive heart failure   Acute hypoxic respiratory failure   Anasarca with end-stage renal disease.   -Nephrology consult for dialysis with UF of about 4 liters planned   Cardiology consult for severe cardiomyopathy--on dobutamine and Milrinone gtt   Continue baseline cardiac medications -amiodarone  * Hypotension   With extremely low EF his blood pressure is running up to 80s systolic but patient is currently alert and oriented -cont cardiac meds  * Diabetes   Continue basal NPH and keep on sliding scale coverage.  * severe malnutrition on IV albumin for hypotension and poor volume status due to cardiomyopathy -dietitian to follwo  CM for d/c planning  Case discussed with Care Management/Social Worker. Management plans discussed with the patient, family and they are in agreement.  CODE STATUS:DNR  DVT Prophylaxis: heparin  TOTAL critical TIME TAKING CARE OF THIS PATIENT: 35 minutes.  >50% time spent on counselling and coordination of care  POSSIBLE D/C IN 2-3 DAYS, DEPENDING ON CLINICAL CONDITION.  Note: This dictation was prepared with Dragon dictation along with smaller phrase technology. Any transcriptional errors that result from this process are unintentional.  Kimmi Acocella M.D on 07/14/2016 at 6:23 PM  Between 7am to 6pm - Pager - [phone]  After 6pm go to [email or web address] - password [email or web address] [facility]  [place] [facility]  Office  [phone]  CC: Primary care physician; Dimple Casey, MD

## 2016-07-14 NOTE — Progress Notes (Signed)
Pre dialysis assessment 

## 2016-07-14 NOTE — Progress Notes (Signed)
Initial Nutrition Assessment  DOCUMENTATION CODES:   Non-severe (moderate) malnutrition in context of chronic illness  INTERVENTION:  -Recommend Ensure Enlive po BID, each supplement provides 350 kcal and 20 grams of protein (potassium and phosphorus low at present, will continue to monitor electrolytes and po intake) -Recommend holding renvela at present as pt is hypophosphatemic (1.9 on 12/4); discussed with MD Kolloru -Pt may benefit from liberalizing diet to Regular  NUTRITION DIAGNOSIS:   Malnutrition related to chronic illness as evidenced by moderate depletion of body fat, moderate depletions of muscle mass, severe fluid accumulation.  GOAL:   Patient will meet greater than or equal to 90% of their needs  MONITOR:   PO intake, Supplement acceptance, Labs, Weight trends  REASON FOR ASSESSMENT:   Malnutrition Screening Tool    ASSESSMENT:   65 yo male admitted with acute on chronic CHF, severe anasarca with ESRD on HD. Pt with CHF with EF 15%, CKD, HTN, HLD  Plan for HD today with goal of 4 L fluid Pt very groggy on visit today. No recorded po intake but pt did appear to eat some breakfast. Pt reports he eats 3 meals per day at home but unable to tell writer what he eats. No wt loss but pt very edematous  Nutrition-Focused physical exam completed. Findings are mild/moderate fat depletion, mild/moderate muscle depletion, and severe edema.   Labs: sodium 134, potassium 3.1, phosphorus 1.9 Meds: renvela, ss novolog, levemir  Diet Order:  Diet heart healthy/carb modified Room service appropriate? Yes; Fluid consistency: Thin  Skin:  Wound (see comment) (stage II coccyx)  Last BM:  12/2  Height:   Ht Readings from Last 1 Encounters:  07/13/16 6' (1.829 m)    Weight:   Wt Readings from Last 1 Encounters:  07/13/16 263 lb 14.3 oz (119.7 kg)    Ideal Body Weight:     BMI:  Body mass index is 35.79 kg/m.  Estimated Nutritional Needs:   Kcal:  2200-2500  kcals  Protein:  110-125 g   Fluid:  1000 mL plus UOP  EDUCATION NEEDS:   No education needs identified at this time  Romelle Starcher MS, RD, LDN [phone] Pager  [phone] Weekend/On-Call Pager

## 2016-07-14 NOTE — Consult Note (Signed)
Cardiology Consultation Note  Patient ID: [name], MRN: [id number], DOB/AGE: Oct 20, 1950 65 y.o. Admit date: 07/13/2016   Date of Consult: 07/14/2016 Primary Physician: Dimple Casey, MD Primary Cardiologist: [facility] (not seen since 2015) Requesting Physician: Dr. Elisabeth Pigeon, MD  Chief Complaint: LE swelling and SOB Reason for Consult: Acute on chronic systolic CHF  HPI: 66 y.o. male with h/o chronic systolic CHF with EF < 15% previously on chronic dobutamine infusion in summer of 2015, NICM, cardiogenic shock previously requiring IABP, ESRD on HD (MWF), PAF/VT on amiodarone previously on Coumadin, history of mural thrombus previously on Coumadin, anemia of chronic disease, malnutrition, prior cocaine abuse, and medication noncompliance presenting significant health hazards who presented to [facility] on 12/4 with SOB and LE swelling.  Patient was last seen by cardiology in 2015 at [facility] He has been lost to follow up since, for reasons he cannot explain. Previously on IV dobutamine, though has not been on this for an extended time period, though he is uncertain how long. Prior echo from 04/2014 showed EF < 15%, multiple LV apical thrombi, AK of interior, septal, and posterior walls. RV moderately enlarged, LA moderately enlarged, moderate MR.  Recently admitted in October 2017 for the same, requiring BiPAP, underwent dialysis with UF and was discharged. Discharge weight of 29 pounds.   He tells me for the past couple of weeks he has been SOB and noted increased LE swelling. He does not know his dry weight or his weight at home. Admission weight of 263 pounds. He required 3-4 L of oxygen upon arrival to the ED to maintain O2 sats above 90%. He was noted to be hypotensive with SBP in the 80's mmHg. Troponin negative x 2. He has been seen by renal with plans for dialysis.     Past Medical History:  Diagnosis Date  . Anemia of chronic disease   . Apical mural thrombus   . Arthritis   . Chronic  systolic heart failure (HCC)    a) ECHO [facility] 02/2014): EF <15%, multiple apical thrombi, RV mod enlarged, mod MR b. RHC (02/21/14) at [facility]: RA 16, RV 64/9 (15), PA 64/32 (42), PCWP: 24, CI: 1.4  . Diabetes mellitus with complication (HCC)   . Drug use    cocaine  . ESRD on hemodialysis (HCC)    a. MWF  . H/O noncompliance with medical treatment, presenting hazards to health   . HLD (hyperlipidemia)   . HTN (hypertension)   . Intrahepatic cholestasis 02/20/2016  . Mitral regurgitation   . PAF (paroxysmal atrial fibrillation) (HCC)   . Pneumonia       Most Recent Cardiac Studies: As above, from 2015   Surgical History:  Past Surgical History:  Procedure Laterality Date  . AV FISTULA PLACEMENT Right 06/27/2014   Procedure: ARTERIOVENOUS (AV) FISTULA CREATION VERSUS GRAFT INSERTION;  Surgeon: Pryor Ochoa, MD;  Location[facility] OR;  Service: Vascular;  Laterality: Right;  . COLONOSCOPY    . FISTULA SUPERFICIALIZATION Right 10/25/2014   Procedure: RIGHT ARM RADIOCEPHALIC FISTULA SUPERFICIALIZATION;  Surgeon: Pryor Ochoa, MD;  Location: [facility] OR;  Service: Vascular;  Laterality: Right;  . INSERTION OF DIALYSIS CATHETER  12/15     Home Meds: Prior to Admission medications   Medication Sig Start Date End Date Taking? Authorizing Provider  acetaminophen (TYLENOL) 325 MG tablet Take 650 mg by mouth every 6 (six) hours as needed for mild pain or moderate pain.   Yes Historical Provider, MD  amiodarone (PACERONE) 200 MG  tablet Take 1 tablet (200 mg total) by mouth daily. 08/23/14  Yes Amy D Clegg, NP  aspirin EC 81 MG EC tablet Take 1 tablet (81 mg total) by mouth daily. 05/05/16  Yes Costin Otelia Sergeant, MD  atorvastatin (LIPITOR) 20 MG tablet Take 20 mg by mouth at bedtime.   Yes Historical Provider, MD  hydrocortisone cream 1 % Apply 1 application topically 3 (three) times daily.   Yes Historical Provider, MD  insulin aspart (NOVOLOG) 100 UNIT/ML injection Inject 3 Units into the skin 3 (three)  times daily before meals.   Yes Historical Provider, MD  insulin NPH Human (HUMULIN N,NOVOLIN N) 100 UNIT/ML injection Inject 0.12 mLs (12 Units total) into the skin at bedtime. 09/13/14  Yes Margarita Grizzle, MD  sevelamer carbonate (RENVELA) 800 MG tablet Take 3 tablets (2,400 mg total) by mouth 3 (three) times daily with meals. 02/22/16  Yes Almon Hercules, MD  azithromycin (ZITHROMAX) 250 MG tablet Take 1 tablet (250 mg total) by mouth daily. Patient not taking: Reported on 07/13/2016 06/12/16   Minna Antis, MD  guaiFENesin-codeine 100-10 MG/5ML syrup Take 5 mLs by mouth every 6 (six) hours as needed for cough. Patient not taking: Reported on 07/13/2016 06/12/16   Minna Antis, MD    Inpatient Medications:  . amiodarone  200 mg Oral Daily  . aspirin EC  81 mg Oral Daily  . atorvastatin  20 mg Oral QHS  . heparin  5,000 Units Subcutaneous Q8H  . hydrocortisone cream  1 application Topical TID  . insulin aspart  0-9 Units Subcutaneous TID WC  . insulin detemir  12 Units Subcutaneous QHS  . mouth rinse  15 mL Mouth Rinse BID  . sevelamer carbonate  2,400 mg Oral TID WC  . sodium chloride flush  3 mL Intravenous Q12H   . milrinone      Allergies: No Known Allergies  Social History   Social History  . Marital status: Divorced    Spouse name: N/A  . Number of children: N/A  . Years of education: N/A   Occupational History  . Not on file.   Social History Main Topics  . Smoking status: Never Smoker  . Smokeless tobacco: Never Used  . Alcohol use No     Comment: last usuage 1 yr. ago  . Drug use:     Types: Cocaine     Comment: hx cocaine use  as of 1 yr. ago  . Sexual activity: Not on file   Other Topics Concern  . Not on file   Social History Narrative   Respiratory therapist     Family History  Problem Relation Age of Onset  . Hypertension Mother      Review of Systems: Review of Systems  Constitutional: Positive for malaise/fatigue. Negative for chills,  diaphoresis, fever and weight loss.  HENT: Negative for congestion.   Eyes: Negative for discharge and redness.  Respiratory: Positive for shortness of breath. Negative for cough, hemoptysis, sputum production and wheezing.   Cardiovascular: Negative for chest pain, palpitations, orthopnea, claudication, leg swelling and PND.  Gastrointestinal: Negative for abdominal pain, blood in stool, heartburn, melena, nausea and vomiting.  Genitourinary: Negative for hematuria.  Musculoskeletal: Negative for falls and myalgias.  Skin: Negative for rash.  Neurological: Positive for weakness. Negative for dizziness, tingling, tremors, sensory change, speech change, focal weakness and loss of consciousness.  Endo/Heme/Allergies: Does not bruise/bleed easily.  Psychiatric/Behavioral: Negative for substance abuse. The patient is not nervous/anxious.  All other systems reviewed and are negative.   Labs:  Recent Labs  07/13/16 1645  TROPONINI <0.03   Lab Results  Component Value Date   WBC 6.1 07/14/2016   HGB 11.4 (L) 07/14/2016   HCT 34.7 (L) 07/14/2016   MCV 90.3 07/14/2016   PLT 139 (L) 07/14/2016     Recent Labs Lab 07/13/16 1645 07/14/16 0353  NA 133* 134*  K 3.3* 3.1*  CL 90* 92*  CO2 32 34*  BUN 15 18  CREATININE 3.21* 3.73*  CALCIUM 8.6* 8.3*  PROT 9.2*  --   BILITOT 3.2*  --   ALKPHOS 96  --   ALT 17  --   AST 41  --   GLUCOSE 77 127*   No results found for: CHOL, HDL, LDLCALC, TRIG No results found for: DDIMER  Radiology/Studies:  Dg Chest Port 1 View  Result Date: 07/13/2016 CLINICAL DATA:  65 y/o  M; shortness of breath. EXAM: PORTABLE CHEST 1 VIEW COMPARISON:  06/12/2016 chest radiograph and 06/10/2014. FINDINGS: Severe cardiomegaly much increased in size from 2015, question pericardial effusion. Small left and trace right pleural effusions. Associated basilar opacities may represent atelectasis or pneumonia. No significant interval change. Bones are unremarkable.  AICD lead noted. IMPRESSION: 1. Severe cardiomegaly much increased in size from 2015, question pericardial effusion. 2. Small left and trace right pleural effusions with associated basilar opacities are stable and may represent atelectasis or pneumonia. Electronically Signed   By: Mitzi Hansen M.D.   On: 07/13/2016 17:27    EKG: Interpreted by me showed: sinus tachycardia, 124 bpm, poor R wave progression, low voltage QRS, nonspecific st/t changes Telemetry: Interpreted by me showed: no data for review  Weights: Filed Weights   07/13/16 1628 07/13/16 2215  Weight: 280 lb (127 kg) 263 lb 14.3 oz (119.7 kg)     Physical Exam: Blood pressure (!) 82/60, pulse 95, temperature 97.6 F (36.4 C), temperature source Axillary, resp. rate 18, height 6' (1.829 m), weight 263 lb 14.3 oz (119.7 kg), SpO2 100 %. Body mass index is 35.79 kg/m. General: Ill appearing, in no acute distress. Head: Normocephalic, atraumatic, sclera non-icteric, no xanthomas, nares are without discharge.  Neck: Negative for carotid bruits. JVD elevated ~ 10 cm. Lungs: Bilateral crackles. Breathing is unlabored. Heart: RRR with S1 S2. II/VI systolic murmur, no rubs, or gallops appreciated. Abdomen: Soft, non-tender, non-distended with normoactive bowel sounds. No hepatomegaly. No rebound/guarding. No obvious abdominal masses. Msk:  Strength and tone appear normal for age. Extremities: No clubbing or cyanosis. 2+ edema. Distal pedal pulses are 2+ and equal bilaterally. Neuro: Alert and oriented X 3. No facial asymmetry. No focal deficit. Moves all extremities spontaneously. Psych:  Responds to questions appropriately with a normal affect.    Assessment and Plan:  Principal Problem:   Acute respiratory failure (HCC) Active Problems:   NICM (nonischemic cardiomyopathy) (HCC)   H/O noncompliance with medical treatment, presenting hazards to health   Protein-calorie malnutrition, severe (HCC)   Anemia of chronic  disease   Acute on chronic systolic congestive heart failure (HCC)   PAF (paroxysmal atrial fibrillation) (HCC)   Type 2 diabetes mellitus with diabetic nephropathy, without long-term current use of insulin (HCC)   ESRD (end stage renal disease) on dialysis (HCC)   Pressure injury of skin    1. Acute respiratory failure with hypoxia: -Likely in the setting of noncompliance and acute on chronic systolic CHF -Wean oxygen as able  2. Acute on chronic systolic CHF/cardiogenic  shock/NICM/dilated cardiomyopathy: -He is significantly volume overloaded -Start low-dose milrinone infusion -Volume management per dialysis -Check echo -CHF education -Check urine drug screen  -Avoid beta blocker at this time given hypotension, cardiogenic shock, decompensation, and prior cocaine abuse -Not a candidate for ACEi/ARB/spiro/Entresto given CKD and hypotension -Not a candidate for Imdur/hydralazine given hypotension  3. PAF/history of VT: -Currently in sinus rhythm -Continue amio, monitor for prolonged QTc -Not on Coumadin upon admission. Given his compliance issues he may not be a strong candidate to restart this at this time -CHADS2VASc at least 5 (CHF, HTN, age x 1, DM, vascular disease)  4. History of mural thrombus: -Last echo from 2015 -Previously on Coumadin -Check echo as above  5. Medication noncompliance presenting significant health hazards: -Overall, poor outcome -Recommend Palliative Care consult to discuss goals of care  6. ESRD on HD: -Per Renal  7. Malnutrition/anasarca: -Receiving IV albumin   8. Anemia of chronic disease: -Stable   Signed, Eula Listen, PA-C [facility] Pager: [phone] 07/14/2016, 10:06 AM

## 2016-07-14 NOTE — Progress Notes (Signed)
Dr. Sheryle Hail notified of SBP <90. No new orders received.

## 2016-07-14 NOTE — Progress Notes (Signed)
HD initiated without issue. No heparin tx. Labs sent. Pt currently sleeping. 4L goal with order to keep map>65.

## 2016-07-14 NOTE — Progress Notes (Signed)
Hemodialysis completed without issue. Goal of 4L met. BP remains >80. Patient medicated with benadryl for severe itching. No other complaints. Repositioned in bed. Vitals stable. Report given to [facility]

## 2016-07-14 NOTE — Plan of Care (Signed)
Pt states that he can't remember the last time he had a Bm. Pt stated that he was having difficulty passing urine Bladder scan showed , pt has edema 4+ including scrotal edema ESRD Anuric Will HD him today with a goal of 4Litres

## 2016-07-14 NOTE — Progress Notes (Signed)
Post HD assessment  

## 2016-07-14 NOTE — Progress Notes (Signed)
[facility]  ROUNDING NOTE   Subjective:   [name] admitted on 07/13/2016 for Acute on chronic systolic congestive heart failure (HCC) [I50.23] Acute respiratory failure with hypoxia (HCC) [J96.01] Other hypervolemia [E87.79]   Patient in ICU with hypotension. Last hemodialysis was yesterday.   Objective:  Vital signs in last 24 hours:  Temp:  [97.6 F (36.4 C)-98.4 F (36.9 C)] 97.6 F (36.4 C) (12/05 0800) Pulse Rate:  [87-97] 95 (12/05 0700) Resp:  [15-25] 18 (12/05 0700) BP: (78-95)/(42-73) 82/60 (12/05 0700) SpO2:  [80 %-100 %] 100 % (12/05 0700) Weight:  [119.7 kg (263 lb 14.3 oz)-127 kg (280 lb)] 119.7 kg (263 lb 14.3 oz) (12/04 2215)  Weight change:  Filed Weights   07/13/16 1628 07/13/16 2215  Weight: 127 kg (280 lb) 119.7 kg (263 lb 14.3 oz)    Intake/Output: No intake/output data recorded.   Intake/Output this shift:  No intake/output data recorded.  Physical Exam: General: NAD, sitting up in bed  Head: Normocephalic, atraumatic. Moist oral mucosal membranes  Eyes: Anicteric, PERRL  Neck: Supple, trachea midline  Lungs:  Bilateral crackles  Heart: Regular rate and rhythm  Abdomen:  Soft, nontender, +distended  Extremities:  +++ peripheral edema.  Neurologic: Nonfocal, moving all four extremities  Skin: No lesions  Access:     Basic Metabolic Panel:  Recent Labs Lab 07/13/16 1645 07/14/16 0353  NA 133* 134*  K 3.3* 3.1*  CL 90* 92*  CO2 32 34*  GLUCOSE 77 127*  BUN 15 18  CREATININE 3.21* 3.73*  CALCIUM 8.6* 8.3*  MG 2.1  --   PHOS 1.9*  --     Liver Function Tests:  Recent Labs Lab 07/13/16 1645  AST 41  ALT 17  ALKPHOS 96  BILITOT 3.2*  PROT 9.2*  ALBUMIN 2.7*   No results for input(s): LIPASE, AMYLASE in the last 168 hours. No results for input(s): AMMONIA in the last 168 hours.  CBC:  Recent Labs Lab 07/13/16 1645 07/14/16 0353  WBC 5.7 6.1  HGB 12.3* 11.4*  HCT 37.3* 34.7*  MCV 88.6 90.3   PLT 148* 139*    Cardiac Enzymes:  Recent Labs Lab 07/13/16 1645  TROPONINI <0.03    BNP: Invalid input(s): POCBNP  CBG:  Recent Labs Lab 07/13/16 2216 07/14/16 0826  GLUCAP 108* 80    Microbiology: Results for orders placed or performed during the hospital encounter of 07/13/16  MRSA PCR Screening     Status: None   Collection Time: 07/13/16 10:11 PM  Result Value Ref Range Status   MRSA by PCR NEGATIVE NEGATIVE Final    Comment:        The GeneXpert MRSA Assay (FDA approved for NASAL specimens only), is one component of a comprehensive MRSA colonization surveillance program. It is not intended to diagnose MRSA infection nor to guide or monitor treatment for MRSA infections.     Coagulation Studies: No results for input(s): LABPROT, INR in the last 72 hours.  Urinalysis: No results for input(s): COLORURINE, LABSPEC, PHURINE, GLUCOSEU, HGBUR, BILIRUBINUR, KETONESUR, PROTEINUR, UROBILINOGEN, NITRITE, LEUKOCYTESUR in the last 72 hours.  Invalid input(s): APPERANCEUR    Imaging: Dg Chest Port 1 View  Result Date: 07/13/2016 CLINICAL DATA:  65 y/o  M; shortness of breath. EXAM: PORTABLE CHEST 1 VIEW COMPARISON:  06/12/2016 chest radiograph and 06/10/2014. FINDINGS: Severe cardiomegaly much increased in size from 2015, question pericardial effusion. Small left and trace right pleural effusions. Associated basilar opacities may represent atelectasis or  pneumonia. No significant interval change. Bones are unremarkable. AICD lead noted. IMPRESSION: 1. Severe cardiomegaly much increased in size from 2015, question pericardial effusion. 2. Small left and trace right pleural effusions with associated basilar opacities are stable and may represent atelectasis or pneumonia. Electronically Signed   By: Mitzi HansenLance  Furusawa-Stratton M.D.   On: 07/13/2016 17:27     Medications:    . amiodarone  200 mg Oral Daily  . aspirin EC  81 mg Oral Daily  . atorvastatin  20 mg Oral  QHS  . heparin  5,000 Units Subcutaneous Q8H  . hydrocortisone cream  1 application Topical TID  . insulin aspart  0-9 Units Subcutaneous TID WC  . insulin detemir  12 Units Subcutaneous QHS  . mouth rinse  15 mL Mouth Rinse BID  . sevelamer carbonate  2,400 mg Oral TID WC  . sodium chloride flush  3 mL Intravenous Q12H   guaiFENesin-codeine  Assessment/ Plan:  [name] with ESRD on hemodialysis, atrial fibrillation, mural thrombus, systolic congestive heart failure, cocaine abuse  [facility]  1. ESRD with volume overload and acute pulmonary edema - hemodialysis today. Plan for daily dialysis - IV albumin with treatment.   2. Systolic congestive heart failure: acute exacerbation - echocardiogram - dobutamine and milrinone as per cardiology  3. Hypotension: due to congestive heart failure.   4. Anemia of chronic kidney disease: hemoglobin at goal, 11.4 - hold epo  5. Secondary Hyperparathyroidism:  - sevelamer with meals.   LOS: 1 [name] 12/5/20179:44 AM

## 2016-07-15 LAB — PARATHYROID HORMONE, INTACT (NO CA): PTH: 113 pg/mL — ABNORMAL HIGH (ref 15–65)

## 2016-07-15 LAB — BASIC METABOLIC PANEL
ANION GAP: 7 (ref 5–15)
BUN: 18 mg/dL (ref 6–20)
CALCIUM: 8.6 mg/dL — AB (ref 8.9–10.3)
CHLORIDE: 94 mmol/L — AB (ref 101–111)
CO2: 33 mmol/L — AB (ref 22–32)
CREATININE: 3.44 mg/dL — AB (ref 0.61–1.24)
GFR calc non Af Amer: 17 mL/min — ABNORMAL LOW (ref >60)
GFR, EST AFRICAN AMERICAN: 20 mL/min — AB (ref >60)
Glucose, Bld: 127 mg/dL — ABNORMAL HIGH (ref 65–99)
Potassium: 3.8 mmol/L (ref 3.5–5.1)
SODIUM: 134 mmol/L — AB (ref 135–145)

## 2016-07-15 LAB — GLUCOSE, CAPILLARY
GLUCOSE-CAPILLARY: 119 mg/dL — AB (ref 65–99)
GLUCOSE-CAPILLARY: 145 mg/dL — AB (ref 65–99)
Glucose-Capillary: 188 mg/dL — ABNORMAL HIGH (ref 65–99)
Glucose-Capillary: 108 mg/dL — ABNORMAL HIGH (ref 65–99)

## 2016-07-15 LAB — ECHOCARDIOGRAM COMPLETE
Height: 72 in
Weight: 4081.16 oz

## 2016-07-15 LAB — HEPATITIS B CORE ANTIBODY, TOTAL: Hep B Core Total Ab: NEGATIVE

## 2016-07-15 LAB — HEPATITIS B SURFACE ANTIBODY,QUALITATIVE: HEP B S AB: REACTIVE

## 2016-07-15 LAB — HEPATITIS B SURFACE ANTIGEN: Hepatitis B Surface Ag: NEGATIVE

## 2016-07-15 MED ORDER — ALBUMIN HUMAN 25 % IV SOLN
12.5000 g | Freq: Once | INTRAVENOUS | Status: AC
  Administered 2016-07-15: 12.5 g via INTRAVENOUS
  Filled 2016-07-15: qty 50

## 2016-07-15 MED ORDER — ALBUTEROL SULFATE (2.5 MG/3ML) 0.083% IN NEBU
2.5000 mg | INHALATION_SOLUTION | Freq: Four times a day (QID) | RESPIRATORY_TRACT | Status: DC | PRN
  Administered 2016-07-15 – 2016-07-18 (×2): 2.5 mg via RESPIRATORY_TRACT
  Filled 2016-07-15 (×2): qty 3

## 2016-07-15 MED ORDER — GUAIFENESIN-CODEINE 100-10 MG/5ML PO SOLN
10.0000 mL | Freq: Four times a day (QID) | ORAL | Status: DC | PRN
  Administered 2016-07-20 – 2016-07-29 (×5): 10 mL via ORAL
  Filled 2016-07-15 (×6): qty 10

## 2016-07-15 MED ORDER — DIPHENHYDRAMINE HCL 25 MG PO CAPS
25.0000 mg | ORAL_CAPSULE | Freq: Once | ORAL | Status: AC | PRN
  Administered 2016-07-15: 25 mg via ORAL
  Filled 2016-07-15: qty 1

## 2016-07-15 MED ORDER — DOCUSATE SODIUM 50 MG/5ML PO LIQD
100.0000 mg | Freq: Every day | ORAL | Status: DC
  Administered 2016-07-15 – 2016-07-30 (×13): 100 mg via ORAL
  Filled 2016-07-15 (×15): qty 10

## 2016-07-15 NOTE — NC FL2 (Signed)
[place] MEDICAID FL2 LEVEL OF CARE SCREENING TOOL     IDENTIFICATION  Patient Name: [name] Birthdate: 08-03-1951 Sex: male Admission Date (Current Location): 07/13/2016  [facility] [place]   Facility and Address:  [facility], [address], [place], [place] [address]      Provider Number: [id number]  Attending Physician Name and Address:  Enedina FinnerSona Patel, MD  Relative Name and Phone Number:       Current Level of Care: Hospital Recommended Level of Care: Skilled Nursing Facility Prior Approval Number:    Date Approved/Denied:   PASRR Number:    Discharge Plan: SNF    Current Diagnoses: Patient Active Problem List   Diagnosis Date Noted  . Pressure injury of skin 07/14/2016  . NICM (nonischemic cardiomyopathy) (HCC) 07/14/2016  . H/O noncompliance with medical treatment, presenting hazards to health 07/14/2016  . Anemia of chronic disease 07/14/2016  . Protein-calorie malnutrition, severe (HCC) 07/14/2016  . Malnutrition of moderate degree 07/14/2016  . Liver dysfunction   . Noncompliance with renal dialysis (HCC)   . ESRD (end stage renal disease) on dialysis (HCC) 05/03/2016  . Generalized weakness 05/02/2016  . Intrahepatic cholestasis 02/20/2016  . Direct hyperbilirubinemia   . Paroxysmal atrial fibrillation (HCC)   . Shortness of breath   . Type 2 diabetes mellitus with diabetic nephropathy, without long-term current use of insulin (HCC)   . End stage renal disease (HCC) 12/25/2014  . Atrial fibrillation [I48.91] 08/09/2014  . Encounter for therapeutic drug monitoring 08/09/2014  . Chronic systolic heart failure (HCC) 07/04/2014  . ESRD (end stage renal disease) (HCC) 07/04/2014  . PAF (paroxysmal atrial fibrillation) (HCC) 07/04/2014  . Controlled diabetes mellitus type II without complication (HCC) 06/22/2014  . Gout 06/22/2014  . ESRD needing dialysis (HCC)   . Pulmonary edema   . Bacteremia  associated with intravascular line (HCC)   . Blood poisoning (HCC)   . Acute respiratory failure with hypoxia (HCC)   . AKI (acute kidney injury) (HCC)   . Screen for STD (sexually transmitted disease)   . Acute respiratory failure (HCC) 06/08/2014  . Cardiogenic shock (HCC) 06/08/2014  . Acute on chronic systolic congestive heart failure (HCC) 06/08/2014  . Acute on chronic renal failure (HCC) 06/08/2014  . Hyperkalemia 06/08/2014  . Respiratory failure (HCC) 06/08/2014    Orientation RESPIRATION BLADDER Height & Weight     Self, Place, Time, Situation  Normal, O2 (3 liters) Continent Weight: 269 lb 6.4 oz (122.2 kg) Height:  6' (182.9 cm)  BEHAVIORAL SYMPTOMS/MOOD NEUROLOGICAL BOWEL NUTRITION STATUS   (none)  (none) Continent Diet (heart healthy/carb modified)  AMBULATORY STATUS COMMUNICATION OF NEEDS Skin   Extensive Assist Verbally PU Stage and Appropriate Care                       Personal Care Assistance Level of Assistance  Bathing, Dressing Bathing Assistance: Limited assistance   Dressing Assistance: Limited assistance     Functional Limitations Info   (no issues)          SPECIAL CARE FACTORS FREQUENCY  PT (By licensed PT)                    Contractures Contractures Info: Not present    Additional Factors Info  Allergies, Code Status Code Status Info: dnr Allergies Info: nka           Current Medications (07/15/2016):  This is the current hospital  active medication list Current Facility-Administered Medications  Medication Dose Route Frequency Provider Last Rate Last Dose  . albuterol (PROVENTIL) (2.5 MG/3ML) 0.083% nebulizer solution 2.5 mg  2.5 mg Nebulization Q6H PRN Enedina Finner, MD   2.5 mg at 07/15/16 1348  . amiodarone (PACERONE) tablet 200 mg  200 mg Oral Daily Altamese Dilling, MD   200 mg at 07/15/16 0856  . aspirin EC tablet 81 mg  81 mg Oral Daily Altamese Dilling, MD   81 mg at 07/15/16 0855  . atorvastatin  (LIPITOR) tablet 20 mg  20 mg Oral QHS Altamese Dilling, MD   20 mg at 07/14/16 2301  . docusate (COLACE) 50 MG/5ML liquid 100 mg  100 mg Oral Daily Enedina Finner, MD   100 mg at 07/15/16 1409  . DOPamine (INTROPIN) 800 mg in dextrose 5 % 250 mL (3.2 mg/mL) infusion  2-5 mcg/kg/min Intravenous Titrated Ryan M Dunn, PA-C      . feeding supplement (ENSURE ENLIVE) (ENSURE ENLIVE) liquid 237 mL  237 mL Oral BID BM Enedina Finner, MD   237 mL at 07/15/16 1412  . guaiFENesin-codeine 100-10 MG/5ML solution 5 mL  5 mL Oral Q6H PRN Altamese Dilling, MD   5 mL at 07/15/16 1409  . heparin injection 5,000 Units  5,000 Units Subcutaneous Q8H Altamese Dilling, MD   5,000 Units at 07/15/16 1409  . hydrocortisone cream 1 % 1 application  1 application Topical TID Altamese Dilling, MD   1 application at 07/15/16 0856  . insulin aspart (novoLOG) injection 0-9 Units  0-9 Units Subcutaneous TID WC Altamese Dilling, MD   1 Units at 07/15/16 1409  . insulin detemir (LEVEMIR) injection 12 Units  12 Units Subcutaneous QHS Altamese Dilling, MD   12 Units at 07/14/16 2301  . MEDLINE mouth rinse  15 mL Mouth Rinse BID Ramonita Lab, MD   15 mL at 07/15/16 0857  . milrinone (PRIMACOR) 20 MG/100 ML (0.2 mg/mL) infusion  0.25 mcg/kg/min Intravenous Continuous Sondra Barges, PA-C   Stopped at 07/15/16 [phone]  . sodium chloride flush (NS) 0.9 % injection 3 mL  3 mL Intravenous Q12H Altamese Dilling, MD   3 mL at 07/15/16 0857     Discharge Medications: Please see discharge summary for a list of discharge medications.  Relevant Imaging Results:  Relevant Lab Results:   Additional Information    York Spaniel, LCSW

## 2016-07-15 NOTE — Clinical Social Work Note (Signed)
CSW is aware patient is from [employer]. CSW has contacted [employer] and patient has been there for STR. Patient is medicare [employer] and will require re-auth for return. Patient will require a PT evaluation. York Spaniel MSW,LCSW [phone]

## 2016-07-15 NOTE — Progress Notes (Signed)
[facility] - [place] at [facility]   PATIENT NAME: [name] MaceKenneth Bynum    MR#:  [id number]  DATE OF BIRTH:  [birth year]  SUBJECTIVE:  Came with increasing SOB and edmea-generalized Feels better today REVIEW OF SYSTEMS:   Review of Systems  Constitutional: Negative for chills, fever and weight loss.  HENT: Negative for ear discharge, ear pain and nosebleeds.   Eyes: Negative for blurred vision, pain and discharge.  Respiratory: Positive for shortness of breath. Negative for sputum production, wheezing and stridor.   Cardiovascular: Positive for orthopnea, leg swelling and PND. Negative for chest pain and palpitations.  Gastrointestinal: Negative for abdominal pain, diarrhea, nausea and vomiting.  Genitourinary: Negative for frequency and urgency.  Musculoskeletal: Negative for back pain and joint pain.  Neurological: Positive for weakness. Negative for sensory change, speech change and focal weakness.  Psychiatric/Behavioral: Negative for depression and hallucinations. The patient is not nervous/anxious.    Tolerating Diet:yes Tolerating PT: pending  DRUG ALLERGIES:  No Known Allergies  VITALS:  Blood pressure (!) 88/63, pulse 100, temperature 98.2 F (36.8 C), temperature source Oral, resp. rate (!) 25, height 6' (1.829 m), weight 117.3 kg (258 lb 9.6 oz), SpO2 92 %.  PHYSICAL EXAMINATION:   Physical Exam  GENERAL:  65 y.o.-year-old patient lying in the bed with no acute distress. Chronically ill, Anasarca EYES: Pupils equal, round, reactive to light and accommodation. No scleral icterus. Extraocular muscles intact.  HEENT: Head atraumatic, normocephalic. Oropharynx and nasopharynx clear.  NECK:  Supple, no jugular venous distention. No thyroid enlargement, no tenderness.  LUNGS: decreased breath sounds bilaterally, no wheezing, rales, rhonchi. No use of accessory muscles of respiration.  CARDIOVASCULAR: S1, S2 normal. No murmurs, rubs, or gallops.   ABDOMEN: Soft, nontender, nondistended. Bowel sounds present. No organomegaly or mass.  EXTREMITIES: No cyanosis, clubbing  ++++ edema b/l.    NEUROLOGIC: Cranial nerves II through XII are intact. No focal Motor or sensory deficits b/l.   PSYCHIATRIC:  patient is alert and oriented x 3.  SKIN: No obvious rash, lesion, or ulcer.   LABORATORY PANEL:  CBC  Recent Labs Lab 07/14/16 0353  WBC 6.1  HGB 11.4*  HCT 34.7*  PLT 139*    Chemistries   Recent Labs Lab 07/13/16 1645  07/14/16 1500 07/15/16 0615  NA 133*  < >  --  134*  K 3.3*  < >  --  3.8  CL 90*  < >  --  94*  CO2 32  < >  --  33*  GLUCOSE 77  < >  --  127*  BUN 15  < >  --  18  CREATININE 3.21*  < >  --  3.44*  CALCIUM 8.6*  < >  --  8.6*  MG 2.1  --  2.2  --   AST 41  --   --   --   ALT 17  --   --   --   ALKPHOS 96  --   --   --   BILITOT 3.2*  --   --   --   < > = values in this interval not displayed. Cardiac Enzymes  Recent Labs Lab 07/13/16 1645  TROPONINI <0.03   RADIOLOGY:  No results found. ASSESSMENT AND PLAN:   [name] MaceKenneth Mcglade  is a 65 y.o. male with a known history of Chronic anemia, severe cardiomyopathy with ejection fraction less than 15%, chronic kidney disease and ESRD on hemodialysis, diabetes, there abuse, hyperlipidemia,  hypertension- claims to be compliant with his dialysis in his medications. He started noticing generalized swelling and edema for last few days. Also feeling short of breath so came to emergency room.  * Acute on chronic systolic congestive heart failure   Acute hypoxic respiratory failure   Anasarca with end-stage renal disease.   -Nephrology consult for dialysis with UF of about 9 liters since [facility]   Cardiology consult for severe cardiomyopathy--was suppose to be on dobutamine and Milrinone gtt but never got started since pt maintained his bp   Continue baseline cardiac medications -amiodarone  * Hypotension   With extremely low EF his blood pressure  is running up to 80s systolic but patient is currently alert and oriented -cont cardiac meds  * Diabetes   Continue basal NPH and keep on sliding scale coverage.  * severe malnutrition on IV albumin for hypotension and poor volume status due to cardiomyopathy -dietitian to follwo  CM for d/c planning  Case discussed with Care Management/Social Worker. Management plans discussed with the patient, family and they are in agreement.  CODE STATUS:DNR  DVT Prophylaxis: heparin  TOTAL critical TIME TAKING CARE OF THIS PATIENT: 35 minutes.  >50% time spent on counselling and coordination of care  POSSIBLE D/C IN 2-3 DAYS, DEPENDING ON CLINICAL CONDITION.  Note: This dictation was prepared with Dragon dictation along with smaller phrase technology. Any transcriptional errors that result from this process are unintentional.  Hulbert Branscome M.D on 07/15/2016 at 8:56 PM  Between 7am to 6pm - Pager - [phone]  After 6pm go to [email or web address] - password [email or web address] [facility]  [facility]  Office  [phone]  CC: Primary care physician; Dimple Casey, MD

## 2016-07-15 NOTE — Progress Notes (Signed)
Pre Dialysis 

## 2016-07-15 NOTE — Progress Notes (Signed)
Post dialysis 

## 2016-07-15 NOTE — Progress Notes (Signed)
Dialysis started 

## 2016-07-15 NOTE — Progress Notes (Signed)
Dialysis complete

## 2016-07-15 NOTE — Progress Notes (Signed)
Patient Name: [name] Date of Encounter: 07/15/2016  Primary Cardiologist: Kateri Mc (not seen in 2 years)  Hospital Problem List     Principal Problem:   Acute respiratory failure (HCC) Active Problems:   NICM (nonischemic cardiomyopathy) (HCC)   H/O noncompliance with medical treatment, presenting hazards to health   Protein-calorie malnutrition, severe (HCC)   Anemia of chronic disease   Acute on chronic systolic congestive heart failure (HCC)   PAF (paroxysmal atrial fibrillation) (HCC)   Type 2 diabetes mellitus with diabetic nephropathy, without long-term current use of insulin (HCC)   ESRD (end stage renal disease) on dialysis (HCC)   Pressure injury of skin   Malnutrition of moderate degree   Liver dysfunction   Noncompliance with renal dialysis (HCC)     Subjective   4 L taken off through dialysis on 12/5 without milrinone or dopamine. Milrinone was stopped 2/2 hypotension. MAP maintained > 65 throughout dialysis. Renal plans for HD again today with goal of 4-4.5 L. MAP currrently mid 70's off pressors. Heart rate upper 90's to low 100's.   Inpatient Medications    Scheduled Meds: . albumin human  12.5 g Intravenous Once  . amiodarone  200 mg Oral Daily  . aspirin EC  81 mg Oral Daily  . atorvastatin  20 mg Oral QHS  . feeding supplement (ENSURE ENLIVE)  237 mL Oral BID BM  . heparin  5,000 Units Subcutaneous Q8H  . hydrocortisone cream  1 application Topical TID  . insulin aspart  0-9 Units Subcutaneous TID WC  . insulin detemir  12 Units Subcutaneous QHS  . mouth rinse  15 mL Mouth Rinse BID  . sodium chloride flush  3 mL Intravenous Q12H   Continuous Infusions: . DOPamine    . milrinone Stopped (07/15/16 0716)   PRN Meds: guaiFENesin-codeine   Vital Signs    Vitals:   07/15/16 0400 07/15/16 0444 07/15/16 0500 07/15/16 0600  BP:   92/61 (!) 86/61  Pulse:   100 (!) 102  Resp: 20  14 (!) 21  Temp: 97.5 F (36.4 C)     TempSrc:      SpO2:    94% (!) 86%  Weight:  263 lb 14.3 oz (119.7 kg)    Height:        Intake/Output Summary (Last 24 hours) at 07/15/16 0937 Last data filed at 07/15/16 0600  Gross per 24 hour  Intake            736.5 ml  Output             4007 ml  Net          -3270.5 ml   Filed Weights   07/14/16 1438 07/14/16 1824 07/15/16 0444  Weight: 263 lb 14.3 oz (119.7 kg) 255 lb 1.2 oz (115.7 kg) 263 lb 14.3 oz (119.7 kg)    Physical Exam    GEN: Critically ill appearing, in no acute distress.  HEENT: Grossly normal.  Neck: Supple, + JVD to the ears, carotid bruits, or masses. Cardiac: RRR, no murmurs, rubs, or gallops. No clubbing, cyanosis. Improved LE edema, still with scrotal edema.   Respiratory:  Improved breath sounds bilaterally. GI: Soft, nontender, distended with anasarca, BS + x 4. MS: no deformity or atrophy. Skin: warm and dry, no rash. Neuro:  Strength and sensation are intact. Psych: Communicates and opens eyes to command.  Normal affect.  Labs    CBC  Recent Labs  07/13/16 1645 07/14/16  0353  WBC 5.7 6.1  HGB 12.3* 11.4*  HCT 37.3* 34.7*  MCV 88.6 90.3  PLT 148* 139*   Basic Metabolic Panel  Recent Labs  07/13/16 1645 07/14/16 0353 07/14/16 1500 07/15/16 0615  NA 133* 134*  --  134*  K 3.3* 3.1*  --  3.8  CL 90* 92*  --  94*  CO2 32 34*  --  33*  GLUCOSE 77 127*  --  127*  BUN 15 18  --  18  CREATININE 3.21* 3.73*  --  3.44*  CALCIUM 8.6* 8.3*  --  8.6*  MG 2.1  --  2.2  --   PHOS 1.9*  --  3.0  --    Liver Function Tests  Recent Labs  07/13/16 1645  AST 41  ALT 17  ALKPHOS 96  BILITOT 3.2*  PROT 9.2*  ALBUMIN 2.7*   No results for input(s): LIPASE, AMYLASE in the last 72 hours. Cardiac Enzymes  Recent Labs  07/13/16 1645  TROPONINI <0.03   BNP Invalid input(s): POCBNP D-Dimer No results for input(s): DDIMER in the last 72 hours. Hemoglobin A1C No results for input(s): HGBA1C in the last 72 hours. Fasting Lipid Panel No results for  input(s): CHOL, HDL, LDLCALC, TRIG, CHOLHDL, LDLDIRECT in the last 72 hours. Thyroid Function Tests No results for input(s): TSH, T4TOTAL, T3FREE, THYROIDAB in the last 72 hours.  Invalid input(s): FREET3  Telemetry    Telemetry unable to be reviewed at this time 2/2 update - Personally Reviewed  ECG    n/a - Personally Reviewed  Radiology    Dg Chest Port 1 View  Result Date: 07/13/2016 CLINICAL DATA:  65 y/o  M; shortness of breath. EXAM: PORTABLE CHEST 1 VIEW COMPARISON:  06/12/2016 chest radiograph and 06/10/2014. FINDINGS: Severe cardiomegaly much increased in size from 2015, question pericardial effusion. Small left and trace right pleural effusions. Associated basilar opacities may represent atelectasis or pneumonia. No significant interval change. Bones are unremarkable. AICD lead noted. IMPRESSION: 1. Severe cardiomegaly much increased in size from 2015, question pericardial effusion. 2. Small left and trace right pleural effusions with associated basilar opacities are stable and may represent atelectasis or pneumonia. Electronically Signed   By: Mitzi HansenLance  Furusawa-Stratton M.D.   On: 07/13/2016 17:27    Cardiac Studies   Echo 07/14/16: Study Conclusions  - Left ventricle: The cavity size was moderately dilated. Systolic   function was severely reduced. The estimated ejection fraction   was in the range of <20%. Diffuse hypokinesis. Regional wall   motion abnormalities cannot be excluded. - Mitral valve: There was mild regurgitation. - Left atrium: The atrium was mildly dilated. - Right ventricle: The cavity size was severely dilated. Wall   thickness was normal. Systolic function was moderately to   severely reduced. - Right atrium: The atrium was mildly dilated. - Tricuspid valve: There was moderate regurgitation. - Pulmonary arteries: Systolic pressure was at least moderately   elevated. PA peak pressure: 50+ mm Hg (S). - Pericardium, extracardiac: A moderate sized  pericardial effusion   was identified. No significant hemodynamic compromise noted.  Patient Profile     65 y.o. male with history of chronic systolic CHF with EF < 15% previously on chronic dobutamine infusion in summer of 2015, NICM, cardiogenic shock previously requiring IABP, ESRD on HD (MWF), PAF/VT on amiodarone previously on Coumadin, history of mural thrombus previously on Coumadin, anemia of chronic disease, malnutrition, prior cocaine abuse, and medication noncompliance presenting significant health hazards who  [facility]ed to [facility] on [facility]4 with SOB and LE swelling found to have acute on chronic systolic CHF and anasarca with ESRD on HD.  Assessment & Plan    1. Acute respiratory failure with hypoxia: -In the setting of noncompliance with acute on chronic systolic CHF and anasarca with ESRD on HD -Wean oxygen as able  2. Acute on chronic combined systolic and diastolic CHF/cardiogenic shock/NICM/dilated cardiomyopathy: -He remains significantly volume overloaded with distended neck veins -4 L taken off through HD on 12/5 -Renal planning for HD again today with goal of 4 to 4/5 L taken off -He did not require pressors during HD, MAP maintained > 65 without assistance -Milrinone stopped 2/2 hypotension prior to HD -Dopamine not on 2/2 heart rate -Continue milrinone/dopamine as needed to maintain MAP > 65 mmHg -Echo as above -UDS pending, may need venous blood draw if able to obtain -Avoid beta blocker at this time given hypotension, cardiogenic shock, decompensation, and prior cocaine abuse -Not a candidate for ACEi/ARB/spiro/Entresto given CKD and hypotension -Not a candidate for Imdur/hydralazine given hypotension  3. PAF/history of VT: -Currently in sinus rhythm -Continue amio, monitor for prolonged QTc (check 12-lead EKG today) -Not on Coumadin upon admission. Given his compliance issues he may not be a strong candidate to restart this at this time -CHADS2VASc at least 5 (CHF,  HTN, age x 1, DM, vascular disease)  4. History of mural thrombus: -Last echo from 2015 -Previously on Coumadin -Echo as above  5. Medication noncompliance presenting significant health hazards: -Overall, poor outcome -Recommend Palliative Care consult to discuss goals of care  6. ESRD on HD: -Per Renal  7. Malnutrition/anasarca: -Receiving IV albumin   8. Anemia of chronic disease: -Stable   Signed, Eula Listen, PA-C [facility] Pager: [phone] 07/15/2016, 9:37 AM   Attending Note Patient seen and examined, agree with detailed note above,  Patient presentation and plan discussed on rounds.   On rounds again this morning, patient is minimally conversant, will not open his eyes, Short response to questions Nurses report successful hemodialysis yesterday with several liters taken off HD again planned for today Systolic pressure continues to run 80-90, on no pressors Milrinone, dopamine were not started yesterday Hemodialysis went as planned without requiring extra-support  Review of records shows frequent hospital admission and ER visits starting July 2017 Poor transportation, frequently missing dialysis Lives in a nursing home Notes indicating he was in [place] from May 2016 until July 2017  On physical exam still with distended neck veins 15 cm/to the mandible Dullness at the bases bilaterally otherwise clear, heart sounds regular with systolic ejection murmur appreciated left sternal border, abdomen distended, nontender, dull to percussion, hard woody edema of the legs bilaterally  Lab work reviewed showing creatinine 3.4, potassium 3.8 Echocardiogram reviewed showing ejection fraction less than 20%, global hypokinesis, at least moderate to moderately to severely elevated right heart pressures  --- Noncompliance with hemodialysis Causing acute on chronic systolic CHF Severely depressed ejection fraction, Not a candidate for outpatient pressors, or  more aggressive modalities such as LVAD given history of noncompliance Relying on hemodialysis for improved fluid status Suspect he will need several more days of hemodialysis In an effort to minimize emergency room visits and hospital admissions, needs transportation to dialysis and clinical social worker to follow-up on HD compliance  Discussed with nursing, hospitalist service Greater than 50% was spent in counseling and coordination of care with patient Total encounter time 35 minutes or more   Signed: Dossie Arbour  M.D., Ph.D. [facility]

## 2016-07-15 NOTE — Progress Notes (Signed)
[facility]  ROUNDING NOTE   Subjective:   Hemodialysis yesterday. UF of 4 litres. Did not require vasopressors.   Objective:  Vital signs in last 24 hours:  Temp:  [97.5 F (36.4 C)-98.2 F (36.8 C)] 97.5 F (36.4 C) (12/06 0400) Pulse Rate:  [94-103] 102 (12/06 0600) Resp:  [14-29] 21 (12/06 0600) BP: (77-106)/(54-76) 86/61 (12/06 0600) SpO2:  [85 %-100 %] 86 % (12/06 0600) Weight:  [115.7 kg (255 lb 1.2 oz)-119.7 kg (263 lb 14.3 oz)] 119.7 kg (263 lb 14.3 oz) (12/06 0444)  Weight change: -7.307 kg (-16 lb 1.8 oz) Filed Weights   07/14/16 1438 07/14/16 1824 07/15/16 0444  Weight: 119.7 kg (263 lb 14.3 oz) 115.7 kg (255 lb 1.2 oz) 119.7 kg (263 lb 14.3 oz)    Intake/Output: I/O last 3 completed shifts: In: 736.5 [P.O.:600; I.V.:136.5] Out: 4007 [Other:4007]   Intake/Output this shift:  No intake/output data recorded.  Physical Exam: General: NAD, sitting up in bed  Head: Normocephalic, atraumatic. Moist oral mucosal membranes  Eyes: Anicteric, PERRL  Neck: Supple, trachea midline  Lungs:  Bilateral crackles  Heart: Regular rate and rhythm  Abdomen:  Soft, nontender, +distended  Extremities:  +++ peripheral edema.  Neurologic: Nonfocal, moving all four extremities  Skin: No lesions  Access: Right arm AVF    Basic Metabolic Panel:  Recent Labs Lab 07/13/16 1645 07/14/16 0353 07/14/16 1500 07/15/16 0615  NA 133* 134*  --  134*  K 3.3* 3.1*  --  3.8  CL 90* 92*  --  94*  CO2 32 34*  --  33*  GLUCOSE 77 127*  --  127*  BUN 15 18  --  18  CREATININE 3.21* 3.73*  --  3.44*  CALCIUM 8.6* 8.3*  --  8.6*  MG 2.1  --  2.2  --   PHOS 1.9*  --  3.0  --     Liver Function Tests:  Recent Labs Lab 07/13/16 1645  AST 41  ALT 17  ALKPHOS 96  BILITOT 3.2*  PROT 9.2*  ALBUMIN 2.7*   No results for input(s): LIPASE, AMYLASE in the last 168 hours. No results for input(s): AMMONIA in the last 168 hours.  CBC:  Recent Labs Lab 07/13/16 1645  07/14/16 0353  WBC 5.7 6.1  HGB 12.3* 11.4*  HCT 37.3* 34.7*  MCV 88.6 90.3  PLT 148* 139*    Cardiac Enzymes:  Recent Labs Lab 07/13/16 1645  TROPONINI <0.03    BNP: Invalid input(s): POCBNP  CBG:  Recent Labs Lab 07/14/16 0826 07/14/16 1137 07/14/16 1647 07/14/16 2300 07/15/16 0739  GLUCAP 80 96 167* 162* 119*    Microbiology: Results for orders placed or performed during the hospital encounter of 07/13/16  MRSA PCR Screening     Status: None   Collection Time: 07/13/16 10:11 PM  Result Value Ref Range Status   MRSA by PCR NEGATIVE NEGATIVE Final    Comment:        The GeneXpert MRSA Assay (FDA approved for NASAL specimens only), is one component of a comprehensive MRSA colonization surveillance program. It is not intended to diagnose MRSA infection nor to guide or monitor treatment for MRSA infections.     Coagulation Studies: No results for input(s): LABPROT, INR in the last 72 hours.  Urinalysis: No results for input(s): COLORURINE, LABSPEC, PHURINE, GLUCOSEU, HGBUR, BILIRUBINUR, KETONESUR, PROTEINUR, UROBILINOGEN, NITRITE, LEUKOCYTESUR in the last 72 hours.  Invalid input(s): APPERANCEUR    Imaging: Dg Chest [facility]  1 View  Result Date: 07/13/2016 CLINICAL DATA:  65 y/o  M; shortness of breath. EXAM: PORTABLE CHEST 1 VIEW COMPARISON:  06/12/2016 chest radiograph and 06/10/2014. FINDINGS: Severe cardiomegaly much increased in size from 2015, question pericardial effusion. Small left and trace right pleural effusions. Associated basilar opacities may represent atelectasis or pneumonia. No significant interval change. Bones are unremarkable. AICD lead noted. IMPRESSION: 1. Severe cardiomegaly much increased in size from 2015, question pericardial effusion. 2. Small left and trace right pleural effusions with associated basilar opacities are stable and may represent atelectasis or pneumonia. Electronically Signed   By: Mitzi HansenLance  Furusawa-Stratton M.D.   On:  07/13/2016 17:27     Medications:   . DOPamine    . milrinone Stopped (07/15/16 0716)   . albumin human  12.5 g Intravenous Once  . amiodarone  200 mg Oral Daily  . aspirin EC  81 mg Oral Daily  . atorvastatin  20 mg Oral QHS  . feeding supplement (ENSURE ENLIVE)  237 mL Oral BID BM  . heparin  5,000 Units Subcutaneous Q8H  . hydrocortisone cream  1 application Topical TID  . insulin aspart  0-9 Units Subcutaneous TID WC  . insulin detemir  12 Units Subcutaneous QHS  . mouth rinse  15 mL Mouth Rinse BID  . sodium chloride flush  3 mL Intravenous Q12H   guaiFENesin-codeine  Assessment/ Plan:  [name] is a 65 y.o. black male with ESRD on hemodialysis, atrial fibrillation, mural thrombus, systolic congestive heart failure, cocaine abuse  [facility]  1. ESRD with volume overload and acute pulmonary edema. Tolerated dialysis yesterday. UF of 4 litres - hemodialysis today. Plan for daily dialysis - IV albumin with treatment.   2. Systolic congestive heart failure: acute exacerbation - echocardiogram 20-25%  - dobutamine and milrinone as per cardiology  3. Hypotension: due to congestive heart failure.   4. Anemia of chronic kidney disease: hemoglobin at goal, 11.4 - hold epo  5. Secondary Hyperparathyroidism:  - held sevelamer  LOS: 2 Perlita Forbush 12/6/20179:37 AM

## 2016-07-15 NOTE — Consult Note (Signed)
WOC Nurse wound consult note Reason for Consult:Partial thickness stage 2 pressure injury to coccyx  Wound type:stage 2 pressure injury Pressure Ulcer POA: Yes Measurement:5 cm x 2 cm x 0.1 cm  Wound DZH:GDJM and moist Drainage (amount, consistency, odor)minimal serosanguinous  Periwound:intact  Skin noted to be rarely moist Dressing procedure/placement/frequency:Cleanse sacral wound with soap and water.  Apply silicone border foam dressing.  Change every 3 days and PRN soilage.  Avoid disposable briefs and pads. Will not follow at this time.  Please re-consult if needed.  Maple Hudson RN BSN [facility] Pager [phone]

## 2016-07-16 LAB — CBC
HCT: 35.6 % — ABNORMAL LOW (ref 40.0–52.0)
Hemoglobin: 11.2 g/dL — ABNORMAL LOW (ref 13.0–18.0)
MCH: 28.5 pg (ref 26.0–34.0)
MCHC: 31.4 g/dL — AB (ref 32.0–36.0)
MCV: 90.9 fL (ref 80.0–100.0)
PLATELETS: 136 10*3/uL — AB (ref 150–440)
RBC: 3.92 MIL/uL — AB (ref 4.40–5.90)
RDW: 17.9 % — AB (ref 11.5–14.5)
WBC: 6.4 10*3/uL (ref 3.8–10.6)

## 2016-07-16 LAB — RENAL FUNCTION PANEL
Albumin: 2.5 g/dL — ABNORMAL LOW (ref 3.5–5.0)
Anion gap: 12 (ref 5–15)
BUN: 16 mg/dL (ref 6–20)
CALCIUM: 8.7 mg/dL — AB (ref 8.9–10.3)
CHLORIDE: 96 mmol/L — AB (ref 101–111)
CO2: 28 mmol/L (ref 22–32)
CREATININE: 3.11 mg/dL — AB (ref 0.61–1.24)
GFR calc Af Amer: 22 mL/min — ABNORMAL LOW (ref >60)
GFR calc non Af Amer: 19 mL/min — ABNORMAL LOW (ref >60)
GLUCOSE: 55 mg/dL — AB (ref 65–99)
Phosphorus: 2.3 mg/dL — ABNORMAL LOW (ref 2.5–4.6)
Potassium: 4.2 mmol/L (ref 3.5–5.1)
SODIUM: 136 mmol/L (ref 135–145)

## 2016-07-16 LAB — GLUCOSE, CAPILLARY
GLUCOSE-CAPILLARY: 89 mg/dL (ref 65–99)
GLUCOSE-CAPILLARY: 97 mg/dL (ref 65–99)
GLUCOSE-CAPILLARY: 94 mg/dL (ref 65–99)
GLUCOSE-CAPILLARY: 116 mg/dL — AB (ref 65–99)

## 2016-07-16 LAB — BASIC METABOLIC PANEL
ANION GAP: 7 (ref 5–15)
BUN: 15 mg/dL (ref 6–20)
CALCIUM: 8.5 mg/dL — AB (ref 8.9–10.3)
CO2: 32 mmol/L (ref 22–32)
Chloride: 96 mmol/L — ABNORMAL LOW (ref 101–111)
Creatinine, Ser: 3.23 mg/dL — ABNORMAL HIGH (ref 0.61–1.24)
GFR calc Af Amer: 21 mL/min — ABNORMAL LOW (ref >60)
GFR calc non Af Amer: 18 mL/min — ABNORMAL LOW (ref >60)
GLUCOSE: 57 mg/dL — AB (ref 65–99)
Potassium: 4.2 mmol/L (ref 3.5–5.1)
Sodium: 135 mmol/L (ref 135–145)

## 2016-07-16 MED ORDER — ALBUMIN HUMAN 25 % IV SOLN
12.5000 g | Freq: Once | INTRAVENOUS | Status: AC
  Administered 2016-07-16: 12.5 g via INTRAVENOUS
  Filled 2016-07-16 (×2): qty 50

## 2016-07-16 NOTE — Progress Notes (Signed)
Blood glucose result was 57 at 0457, performed a POCT at 0654 glucose level is 89.

## 2016-07-16 NOTE — Progress Notes (Signed)
Nutrition Follow-up  DOCUMENTATION CODES:   Non-severe (moderate) malnutrition in context of chronic illness  INTERVENTION:  1. Continue Ensure Enlive po BID, each supplement provides 350 kcal and 20 grams of protein 2. Continue Magic cup TID with meals, each supplement provides 290 kcal and 9 grams of protein 3. Encourage and monitor PO intake  NUTRITION DIAGNOSIS:   Malnutrition related to chronic illness as evidenced by moderate depletion of body fat, moderate depletions of muscle mass, severe fluid accumulation -ongoing  GOAL:   Patient will meet greater than or equal to 90% of their needs -progressing  MONITOR:   PO intake, Supplement acceptance, Labs, Weight trends  REASON FOR ASSESSMENT:   Malnutrition Screening Tool    ASSESSMENT:   65 yo male admitted with acute on chronic CHF, severe anasarca with ESRD on HD. Pt with CHF with EF 15%, CKD, HTN, HLD  Patient continues to exhibit anasarca. Weight is trending downwards - down 13kgs since admit. Has undergone 2 days of HD thus far - to be dialysized again today. 9 Liters pulled thus far. Continues to eat poorly per RN. Eating bits here and there. Consuming Ensure.   Labs and medications reviewed: K 4.2  Diet Order:  Diet renal/carb modified with fluid restriction Diet-HS Snack? Nothing; Room service appropriate? Yes; Fluid consistency: Thin; Fluid restriction: 1200 mL Fluid  Skin:  Wound (see comment) (stage II coccyx)  Last BM:  12/2  Height:   Ht Readings from Last 1 Encounters:  07/13/16 6' (1.829 m)    Weight:   Wt Readings from Last 1 Encounters:  07/16/16 251 lb 1.6 oz (113.9 kg)    Ideal Body Weight:     BMI:  Body mass index is 34.06 kg/m.  Estimated Nutritional Needs:   Kcal:  2200-2500 kcals  Protein:  110-125 g   Fluid:  1000 mL plus UOP  EDUCATION NEEDS:   No education needs identified at this time  Dionne Ano. Ataya Murdy, MS, RD LDN Inpatient Clinical Dietitian Pager  [phone]

## 2016-07-16 NOTE — Progress Notes (Signed)
Pt signed off dialysis  tx 1 hr early.

## 2016-07-16 NOTE — Progress Notes (Signed)
Dialysis started 

## 2016-07-16 NOTE — Care Management (Signed)
Patient transferred out of icu to 2A 12.6 pm.  He is a chronic dialysis patient for esrd.  Elnita Maxwell with [facility] notified of admission and faxed H/p. He is from [facility].  CSW is involved.  Discussed during progression that patient should be sitting for his dialysis treatments

## 2016-07-16 NOTE — Progress Notes (Signed)
[facility]  ROUNDING NOTE   Subjective:   2 days of hemadialysis treatment, total UF of 9 litres. Plan on dialysis again today.   Objective:  Vital signs in last 24 hours:  Temp:  [97.9 F (36.6 C)-98.7 F (37.1 C)] 97.9 F (36.6 C) (12/07 1113) Pulse Rate:  [93-105] 96 (12/07 1113) Resp:  [16-31] 18 (12/07 1113) BP: (74-111)/(42-77) 80/53 (12/07 1113) SpO2:  [84 %-99 %] 93 % (12/07 1113) Weight:  [113 kg (249 lb 1.6 oz)-122.2 kg (269 lb 6.4 oz)] 113.9 kg (251 lb 1.6 oz) (12/07 0513)  Weight change: 2.5 kg (5 lb 8.2 oz) Filed Weights   07/15/16 1810 07/15/16 2142 07/16/16 0513  Weight: 117.3 kg (258 lb 9.6 oz) 113 kg (249 lb 1.6 oz) 113.9 kg (251 lb 1.6 oz)    Intake/Output: I/O last 3 completed shifts: In: 1407.4 [P.O.:1297; I.V.:110.4] Out: 5000 [Other:5000]   Intake/Output this shift:  No intake/output data recorded.  Physical Exam: General: NAD, sitting up in bed  Head: Normocephalic, atraumatic. Moist oral mucosal membranes  Eyes: Anicteric, PERRL  Neck: Supple, trachea midline  Lungs:  Bilateral crackles  Heart: Regular rate and rhythm  Abdomen:  Soft, nontender, +distended  Extremities:  +++ peripheral edema.  Neurologic: Nonfocal, moving all four extremities  Skin: No lesions  Access: Right arm AVF    Basic Metabolic Panel:  Recent Labs Lab 07/13/16 1645 07/14/16 0353 07/14/16 1500 07/15/16 0615 07/16/16 0454  NA 133* 134*  --  134* 135  K 3.3* 3.1*  --  3.8 4.2  CL 90* 92*  --  94* 96*  CO2 32 34*  --  33* 32  GLUCOSE 77 127*  --  127* 57*  BUN 15 18  --  18 15  CREATININE 3.21* 3.73*  --  3.44* 3.23*  CALCIUM 8.6* 8.3*  --  8.6* 8.5*  MG 2.1  --  2.2  --   --   PHOS 1.9*  --  3.0  --   --     Liver Function Tests:  Recent Labs Lab 07/13/16 1645  AST 41  ALT 17  ALKPHOS 96  BILITOT 3.2*  PROT 9.2*  ALBUMIN 2.7*   No results for input(s): LIPASE, AMYLASE in the last 168 hours. No results for input(s): AMMONIA in  the last 168 hours.  CBC:  Recent Labs Lab 07/13/16 1645 07/14/16 0353  WBC 5.7 6.1  HGB 12.3* 11.4*  HCT 37.3* 34.7*  MCV 88.6 90.3  PLT 148* 139*    Cardiac Enzymes:  Recent Labs Lab 07/13/16 1645  TROPONINI <0.03    BNP: Invalid input(s): POCBNP  CBG:  Recent Labs Lab 07/15/16 1608 07/15/16 2211 07/16/16 0651 07/16/16 0801 07/16/16 1115  GLUCAP 188* 108* 89 97 94    Microbiology: Results for orders placed or performed during the hospital encounter of 07/13/16  MRSA PCR Screening     Status: None   Collection Time: 07/13/16 10:11 PM  Result Value Ref Range Status   MRSA by PCR NEGATIVE NEGATIVE Final    Comment:        The GeneXpert MRSA Assay (FDA approved for NASAL specimens only), is one component of a comprehensive MRSA colonization surveillance program. It is not intended to diagnose MRSA infection nor to guide or monitor treatment for MRSA infections.     Coagulation Studies: No results for input(s): LABPROT, INR in the last 72 hours.  Urinalysis: No results for input(s): COLORURINE, LABSPEC, PHURINE, GLUCOSEU, HGBUR, BILIRUBINUR,  KETONESUR, PROTEINUR, UROBILINOGEN, NITRITE, LEUKOCYTESUR in the last 72 hours.  Invalid input(s): APPERANCEUR    Imaging: No results found.   Medications:    . amiodarone  200 mg Oral Daily  . aspirin EC  81 mg Oral Daily  . atorvastatin  20 mg Oral QHS  . docusate  100 mg Oral Daily  . feeding supplement (ENSURE ENLIVE)  237 mL Oral BID BM  . heparin  5,000 Units Subcutaneous Q8H  . hydrocortisone cream  1 application Topical TID  . insulin aspart  0-9 Units Subcutaneous TID WC  . mouth rinse  15 mL Mouth Rinse BID  . sodium chloride flush  3 mL Intravenous Q12H   albuterol, guaiFENesin-codeine  Assessment/ Plan:  [name] with ESRD on hemodialysis, atrial fibrillation, mural thrombus, systolic congestive heart failure, cocaine abuse  [facility] [address]  [facility]  1. ESRD with volume overload and acute pulmonary edema. Daily dialysis. UF since admission 9 litres - hemodialysis today. Plan for daily dialysis - IV albumin with treatment.   2. Systolic congestive heart failure: acute exacerbation - echocardiogram 20-25%  - appreciate cardiology inpu  3. Hypotension: due to congestive heart failure.   4. Anemia of chronic kidney disease: hemoglobin at goal, 11.4 - hold epo  5. Secondary Hyperparathyroidism:  - held sevelamer  6. Hyponatremia: from volume overload.   LOS: 3 [name] 12/7/201711:21 AM

## 2016-07-16 NOTE — Care Management Important Message (Signed)
Important Message  Patient Details  Name: [name] MRN: [id number] Date of Birth: June 24, 1951   Medicare Important Message Given:       Eber Hong, RN 07/16/2016, 4:17 PM

## 2016-07-16 NOTE — Progress Notes (Signed)
Pre Dialysis 

## 2016-07-16 NOTE — Progress Notes (Signed)
Notified patient of low blood pressure. Patient is asymptomatic. Dr.Pyreddy gave no new orders but to continue to monitor and observe.

## 2016-07-16 NOTE — Progress Notes (Signed)
[facility] [place] [facility]   PATIENT NAME: [name]    MR#:  [id number]  DATE OF BIRTH:  15-Feb-1951  SUBJECTIVE:  Came with increasing SOB and edmea-generalized Having cough, low sugars this morning. Blood pressure is  Low, REVIEW OF SYSTEMS:   Review of Systems  Constitutional: Negative for chills, fever and weight loss.  HENT: Negative for ear discharge, ear pain and nosebleeds.   Eyes: Negative for blurred vision, pain and discharge.  Respiratory: Positive for shortness of breath. Negative for sputum production, wheezing and stridor.   Cardiovascular: Positive for orthopnea, leg swelling and PND. Negative for chest pain and palpitations.  Gastrointestinal: Negative for abdominal pain, diarrhea, nausea and vomiting.  Genitourinary: Negative for frequency and urgency.  Musculoskeletal: Negative for back pain and joint pain.  Neurological: Positive for weakness. Negative for sensory change, speech change and focal weakness.  Psychiatric/Behavioral: Negative for depression and hallucinations. The patient is not nervous/anxious.    Tolerating Diet:yes Tolerating PT: pending  DRUG ALLERGIES:  No Known Allergies  VITALS:  Blood pressure (!) 81/54, pulse 99, temperature 97.9 F (36.6 C), temperature source Oral, resp. rate (!) 22, height 6' (1.829 m), weight 113.9 kg (251 lb 1.6 oz), SpO2 93 %.  PHYSICAL EXAMINATION:   Physical Exam  GENERAL:  65 y.o.-year-old patient lying in the bed with no acute distress. Chronically ill, Anasarca EYES: Pupils equal, round, reactive to light and accommodation. No scleral icterus. Extraocular muscles intact.  HEENT: Head atraumatic, normocephalic. Oropharynx and nasopharynx clear.  NECK:  Supple, no jugular venous distention. No thyroid enlargement, no tenderness.  LUNGS: decreased breath sounds bilaterally, no wheezing, rales, rhonchi. No use of accessory muscles of respiration.  CARDIOVASCULAR: S1, S2  normal. No murmurs, rubs, or gallops.  ABDOMEN: Soft, nontender, nondistended. Bowel sounds present. No organomegaly or mass.  EXTREMITIES: No cyanosis, clubbing  ++++ edema b/l.    NEUROLOGIC: Cranial nerves II through XII are intact. No focal Motor or sensory deficits b/l.   PSYCHIATRIC:  patient is alert and oriented x 3.  SKIN: No obvious rash, lesion, or ulcer.   LABORATORY PANEL:  CBC  Recent Labs Lab 07/14/16 0353  WBC 6.1  HGB 11.4*  HCT 34.7*  PLT 139*    Chemistries   Recent Labs Lab 07/13/16 1645  07/14/16 1500  07/16/16 0454  NA 133*  < >  --   < > 135  K 3.3*  < >  --   < > 4.2  CL 90*  < >  --   < > 96*  CO2 32  < >  --   < > 32  GLUCOSE 77  < >  --   < > 57*  BUN 15  < >  --   < > 15  CREATININE 3.21*  < >  --   < > 3.23*  CALCIUM 8.6*  < >  --   < > 8.5*  MG 2.1  --  2.2  --   --   AST 41  --   --   --   --   ALT 17  --   --   --   --   ALKPHOS 96  --   --   --   --   BILITOT 3.2*  --   --   --   --   < > = values in this interval not displayed. Cardiac Enzymes  Recent Labs Lab 07/13/16 1645  TROPONINI <0.03   RADIOLOGY:  No results found. ASSESSMENT AND PLAN:   [name]  is a 65 y.o. male with a known history of Chronic anemia, severe cardiomyopathy with ejection fraction less than 15%, chronic kidney disease and ESRD on hemodialysis, diabetes, there abuse, hyperlipidemia, hypertension- claims to be compliant with his dialysis in his medications. He started noticing generalized swelling and edema for last few days. Also feeling short of breath so came to emergency room.  * Acute on chronic systolic congestive heart failure   Acute hypoxic respiratory failure   Anasarca with end-stage renal disease.   -Nephrology consult for dialysis with UF of about 9 liters since [facility]   Cardiology consult for severe cardiomyopathy--was suppose to be on dobutamine and Milrinone gtt but never got started since pt maintained his bp   Continue  baseline cardiac medications -amiodarone  * Hypotension   With extremely low EF his blood pressure is running up to 80s systolic but patient is currently alert and oriented -cont cardiac meds Midodrine to help with XT:KWIO talk to nephrology.  * Diabetes';hypoglycemia;hold levemir tonight;continue close monitoring of BG,pt asymptomatic. PT consult;pt has no place to go,  * severe malnutrition on IV albumin for hypotension and poor volume status due to cardiomyopathy -dietitian to follwo  CM for d/c planning  Case discussed with Care Management/Social Worker. Management plans discussed with the patient, family and they are in agreement.  CODE STATUS:DNR  DVT Prophylaxis: heparin  TOTAL critical TIME TAKING CARE OF THIS PATIENT: 35 minutes.  >50% time spent on counselling and coordination of care  POSSIBLE D/C IN 2-3 DAYS, DEPENDING ON CLINICAL CONDITION.  Note: This dictation was prepared with Dragon dictation along with smaller phrase technology. Any transcriptional errors that result from this process are unintentional.  [name] M.D on 07/16/2016 at 8:39 AM  Between 7am to 6pm - Pager - [phone]  After 6pm go to [email or web address] - password [email or web address] [facility]  [place] [facility]  Office  [phone]  CC: Primary care physician; Dimple Casey, MD

## 2016-07-16 NOTE — Progress Notes (Signed)
Post dialysis 

## 2016-07-16 NOTE — Plan of Care (Signed)
Problem: Fluid Volume: Goal: Ability to maintain a balanced intake and output will improve Outcome: Not Progressing Patient in non compliant with fluid restrictions.

## 2016-07-16 NOTE — H&P (Signed)
Blood glucose result was 57 at 0457, performed a POCT at 0654 glucose level is 89. 

## 2016-07-16 NOTE — Evaluation (Signed)
Physical Therapy Evaluation Patient Details Name: [name] MaceKenneth [name] MRN: [id number] DOB: [birth year] Today's Date: 07/16/2016   History of Present Illness  Pt is a 65 y/o M who noticed generalized swelling and edema for a few days along with SOB.  In the ER he was hypoxic requiring 3-4 L O2.  Pt has been receiving HD treatment while in the hospital.  Pt with ESRD with volume overload and acute pulmonary edema and acute exacerbation of systolic congestive heart failure.  Pt's PMH includes chronic hypotension due to very low EF, a-fib, cocaine abuse.    Clinical Impression  Pt admitted with above diagnosis. Pt currently with functional limitations due to the deficits listed below (see PT Problem List). [name] presents very agitated but [facility]  He does not offer any information in regards to PLOF or home layout.  Pt from SNF and recommend return to SNF as he currently requires modA +2 for stand pivot transfer.  Pt will benefit from skilled PT to increase their independence and safety with mobility to allow discharge to the venue listed below.      Follow Up Recommendations SNF    Equipment Recommendations  Other (comment) (TBD at next venue of care)    Recommendations for Other Services       Precautions / Restrictions Precautions Precautions: Fall Restrictions Weight Bearing Restrictions: No      Mobility  Bed Mobility Overal bed mobility: Needs Assistance Bed Mobility: Supine to Sit     Supine to sit: Mod assist;HOB elevated     General bed mobility comments: Assist to advance LEs to EOB and pt with heavy use of bed rail with increased time and effort to achieve sitting.  Transfers Overall transfer level: Needs assistance Equipment used: 2 person hand held assist Transfers: Sit to/from [employer];Stand Pivot Transfers Sit to Stand: +2 physical assistance;Mod assist Stand pivot transfers: Mod assist;+2 physical assistance       General  transfer comment: Assist to boost to standing, to control [facility], and to remain steady while pivoting.  Directional cues provided as well as cues for proper hand placement.  Pt shuffles his feet and demonstrates flexed posture.  Ambulation/Gait             General Gait Details: unable to assess at this time  Stairs            Wheelchair Mobility    Modified Rankin (Stroke Patients Only)       Balance Overall balance assessment: Needs assistance Sitting-balance support: Single extremity supported;Feet supported Sitting balance-Leahy Scale: Poor Sitting balance - Comments: Relies on at least 1 UE supported on bed while sitting EOB   Standing balance support: Bilateral upper extremity supported;During functional activity Standing balance-Leahy Scale: Poor                               Pertinent Vitals/Pain Pain Assessment: Faces Faces Pain Scale: Hurts even more Pain Location: grimacing with mobility, pt did not specify Pain Descriptors / Indicators: Grimacing Pain Intervention(s): Limited activity within patient's tolerance;Monitored during session    Home Living Family/patient expects to be discharged to:: Skilled nursing facility                 Additional Comments: Pt declines to offer this PT information regarding home living.  Per most recent PT note (9/17) pt was experiencing homelessness.    Prior Function Level  of Independence: Needs assistance   Gait / Transfers Assistance Needed: Pt reports he has not been ambulating for a few days and has been staying in bed.  Prior to this pt reports he was ambulating but does not provide any additional details.  ADL's / Homemaking Assistance Needed: Likely was needing assist with most ADLs  Comments: Per chart review assume pt is from [facility].       Hand Dominance        Extremity/Trunk Assessment   Upper Extremity Assessment: Generalized weakness            Lower Extremity Assessment: Generalized weakness      Cervical / Trunk Assessment: Kyphotic  Communication   Communication: No difficulties  Cognition Arousal/Alertness: Awake/alert Behavior During Therapy: Agitated;Impulsive Overall Cognitive Status: Within Functional Limits for tasks assessed                      General Comments General comments (skin integrity, edema, etc.): On first attempt to see pt the pt refuses PT and requests that PT return 12/8.  However, a few minutes later pt is requesting that PT assist him to the [facility].  Pt easily agitated throughout session and is impulsive during transfers.  He demonstrates poor safety awareness and is at an increased risk of falling.    Exercises     Assessment/Plan    PT Assessment Patient needs continued PT services  PT Problem List Decreased strength;Decreased activity tolerance;Decreased balance;Decreased mobility;Decreased knowledge of use of DME;Decreased safety awareness;Pain          PT Treatment Interventions DME instruction;Gait training;Functional mobility training;Therapeutic activities;Therapeutic exercise;Balance training;Neuromuscular re-education;Patient/family education    PT Goals (Current goals can be found in the Care Plan section)  Acute Rehab PT Goals Patient Stated Goal: none stated PT Goal Formulation: With patient Time For Goal Achievement: 07/30/16 Potential to Achieve Goals: Fair    Frequency Min 2X/week   Barriers to discharge   Unsure of assist available at home or home layout    Co-evaluation               End of Session Equipment Utilized During Treatment: Gait belt Activity Tolerance: Treatment limited secondary to agitation Patient left: Other (comment);with nursing/sitter in room;with call bell/phone within reach (on [facility]) Nurse Communication: Mobility status (RN and NT assist/present during PT session)         Time: 1346-1400 PT Time Calculation (min) (ACUTE  ONLY): 14 min   Charges:   PT Evaluation $PT Eval Low Complexity: 1 Procedure     PT G CodesEncarnacion Chu PT, DPT 07/16/2016, 2:46 PM

## 2016-07-17 LAB — GLUCOSE, CAPILLARY
GLUCOSE-CAPILLARY: 112 mg/dL — AB (ref 65–99)
GLUCOSE-CAPILLARY: 160 mg/dL — AB (ref 65–99)
GLUCOSE-CAPILLARY: 177 mg/dL — AB (ref 65–99)
Glucose-Capillary: 161 mg/dL — ABNORMAL HIGH (ref 65–99)
Glucose-Capillary: 169 mg/dL — ABNORMAL HIGH (ref 65–99)
Glucose-Capillary: 132 mg/dL — ABNORMAL HIGH (ref 65–99)

## 2016-07-17 LAB — BASIC METABOLIC PANEL
ANION GAP: 8 (ref 5–15)
BUN: 16 mg/dL (ref 6–20)
CHLORIDE: 92 mmol/L — AB (ref 101–111)
CO2: 31 mmol/L (ref 22–32)
Calcium: 8.6 mg/dL — ABNORMAL LOW (ref 8.9–10.3)
Creatinine, Ser: 2.88 mg/dL — ABNORMAL HIGH (ref 0.61–1.24)
GFR, EST AFRICAN AMERICAN: 24 mL/min — AB (ref >60)
GFR, EST NON AFRICAN AMERICAN: 21 mL/min — AB (ref >60)
Glucose, Bld: 120 mg/dL — ABNORMAL HIGH (ref 65–99)
POTASSIUM: 3.9 mmol/L (ref 3.5–5.1)
SODIUM: 131 mmol/L — AB (ref 135–145)

## 2016-07-17 MED ORDER — ALBUMIN HUMAN 25 % IV SOLN
12.5000 g | Freq: Once | INTRAVENOUS | Status: AC
  Administered 2016-07-17: 12.5 g via INTRAVENOUS
  Filled 2016-07-17: qty 50

## 2016-07-17 NOTE — Progress Notes (Signed)
HD initiated without issue  

## 2016-07-17 NOTE — Progress Notes (Signed)
HD completed. 4L goal met without issue. Pt unable to finish the last 15 minutes of treatment d/t uncomfortable in bed. Stated "Ok that's enough, take me off." Treatment completed safely. Pt is due back for HD tomorrow as well.

## 2016-07-17 NOTE — Care Management (Addendum)
Error regarding clinic- [facility]   Patient is followed at [address].  During progression discussed the need for patient to sit for dialysis treatments.

## 2016-07-17 NOTE — Care Management (Signed)
Patient's dialysis clinic is [facility] [address].

## 2016-07-17 NOTE — Progress Notes (Signed)
Pre HD assessment  

## 2016-07-17 NOTE — Progress Notes (Signed)
Pre HD  

## 2016-07-17 NOTE — Clinical Social Work Note (Signed)
Patient is from [facility], MSW contacted SNF, they restarted insurance authorization.  Patient plans to return back to SNF.  MSW to complete assessment at later time.  [name]. Hassan Rowan, MSW [phone]  Mon-Fri 8a-4:30p 07/17/2016 5:41 PM

## 2016-07-17 NOTE — Progress Notes (Signed)
[facility] - [place] at [facility]   PATIENT NAME: [name]    MR#:  [id number]  DATE OF BIRTH:  Nov 22, 1950  SUBJECTIVE:  Came with increasing SOB and edmea-generalized Having cough, low sugars this morning. Blood pressure is  Low, REVIEW OF SYSTEMS:   Review of Systems  Constitutional: Negative for chills, fever and weight loss.  HENT: Negative for ear discharge, ear pain and nosebleeds.   Eyes: Negative for blurred vision, pain and discharge.  Respiratory: Positive for shortness of breath. Negative for sputum production, wheezing and stridor.   Cardiovascular: Positive for orthopnea, leg swelling and PND. Negative for chest pain and palpitations.  Gastrointestinal: Negative for abdominal pain, diarrhea, nausea and vomiting.  Genitourinary: Negative for frequency and urgency.  Musculoskeletal: Negative for back pain and joint pain.  Neurological: Positive for weakness. Negative for sensory change, speech change and focal weakness.  Psychiatric/Behavioral: Negative for depression and hallucinations. The patient is not nervous/anxious.    Tolerating Diet:yes Tolerating PT: pending  DRUG ALLERGIES:  No Known Allergies  VITALS:  Blood pressure (!) 92/56, pulse 98, temperature 97.8 F (36.6 C), temperature source Oral, resp. rate 18, height 6' (1.829 m), weight 114.3 kg (252 lb), SpO2 93 %.  PHYSICAL EXAMINATION:   Physical Exam  GENERAL:  65 y.o.-year-old patient lying in the bed with no acute distress. Chronically ill, Anasarca EYES: Pupils equal, round, reactive to light and accommodation. No scleral icterus. Extraocular muscles intact.  HEENT: Head atraumatic, normocephalic. Oropharynx and nasopharynx clear.  NECK:  Supple, no jugular venous distention. No thyroid enlargement, no tenderness.  LUNGS: decreased breath sounds bilaterally, no wheezing, rales, rhonchi. No use of accessory muscles of respiration.  CARDIOVASCULAR: S1, S2 normal. No  murmurs, rubs, or gallops.  ABDOMEN: Soft, nontender, nondistended. Bowel sounds present. No organomegaly or mass.  EXTREMITIES: No cyanosis, clubbing  ++++ edema b/l.    NEUROLOGIC: Cranial nerves II through XII are intact. No focal Motor or sensory deficits b/l.   PSYCHIATRIC:  patient is alert and oriented x 3.  SKIN: No obvious rash, lesion, or ulcer.   LABORATORY PANEL:  CBC  Recent Labs Lab 07/16/16 0454  WBC 6.4  HGB 11.2*  HCT 35.6*  PLT 136*    Chemistries   Recent Labs Lab 07/13/16 1645  07/14/16 1500  07/17/16 0428  NA 133*  < >  --   < > 131*  K 3.3*  < >  --   < > 3.9  CL 90*  < >  --   < > 92*  CO2 32  < >  --   < > 31  GLUCOSE 77  < >  --   < > 120*  BUN 15  < >  --   < > 16  CREATININE 3.21*  < >  --   < > 2.88*  CALCIUM 8.6*  < >  --   < > 8.6*  MG 2.1  --  2.2  --   --   AST 41  --   --   --   --   ALT 17  --   --   --   --   ALKPHOS 96  --   --   --   --   BILITOT 3.2*  --   --   --   --   < > = values in this interval not displayed. Cardiac Enzymes  Recent Labs Lab 07/13/16 1645  TROPONINI <0.03  RADIOLOGY:  No results found. ASSESSMENT AND PLAN:   Trevor MaceKenneth Walts  is a 65 y.o. male with a known history of Chronic anemia, severe cardiomyopathy with ejection fraction less than 15%, chronic kidney disease and ESRD on hemodialysis, diabetes, there abuse, hyperlipidemia, hypertension- claims to be compliant with his dialysis in his medications. He started noticing generalized swelling and edema for last few days. Also feeling short of breath so came to emergency room.  * Acute on chronic systolic congestive heart failure   Acute hypoxic respiratory failure   Anasarca with end-stage renal disease.   -Nephrology [facility] for [facility]   Cardiology consult for severe cardiomyopathy--was suppose to be on dobutamine and Milrinone gtt but never got started since pt maintained his bp   Continue baseline  cardiac medications -amiodarone  * Hypotension   With extremely low EF his blood pressure is running up to 80s systolic but patient is currently alert and oriented -cont cardiac meds Midodrine to help with ZO:XWRUBP:will talk to nephrology.  * Diabetes';hypoglycemia;hold levemir tonight;continue close monitoring of BG,pt asymptomatic. PT consult;pt has no place to go,  * severe malnutrition on IV albumin for hypotension and poor volume status due to cardiomyopathy -dietitian to follwo  CM for d/c planning  Case discussed with Care Management/Social Worker. Management plans discussed with the patient, family and they are in agreement.  CODE STATUS:DNR  DVT Prophylaxis: heparin  TOTAL critical TIME TAKING CARE OF THIS PATIENT: 35 minutes.  >50% time spent on counselling and coordination of care  POSSIBLE D/C IN 2-3 DAYS, DEPENDING ON CLINICAL CONDITION.  Note: This dictation was prepared with Dragon dictation along with smaller phrase technology. Any transcriptional errors that result from this process are unintentional.  Chivonne Rascon M.D on 07/17/2016 at 11:38 AM  Between 7am to 6pm - Pager - [phone]  After 6pm go to [email or web address] - password [email or web address] [facility]  [place] [facility]  Office  [phone]  CC: Primary care physician; Dimple CaseySean A Smith, MD

## 2016-07-17 NOTE — Progress Notes (Signed)
PT Cancellation Note  Patient Details Name: [name] MRN: [id number] DOB: 08/01/1951   Cancelled Treatment:    Reason Eval/Treat Not Completed: Patient at procedure or test/unavailable (at HD).  Will continue to follow acutely.   Encarnacion Chu PT, DPT 07/17/2016, 1:49 PM

## 2016-07-17 NOTE — Care Management Important Message (Signed)
Important Message  Patient Details  Name: [name] MRN: [id number] Date of Birth: 22-Apr-1951   Medicare Important Message Given:  Yes    Eber Hong, RN 07/17/2016, 9:54 AM

## 2016-07-17 NOTE — Progress Notes (Signed)
[facility]  ROUNDING NOTE   Subjective:   Patient asked to come off hemodialysis treatment early yesterday. UF of 3686 mL.    Objective:  Vital signs in last 24 hours:  Temp:  [97.8 F (36.6 C)-98.2 F (36.8 C)] 97.8 F (36.6 C) (12/08 0830) Pulse Rate:  [95-98] 98 (12/08 0830) Resp:  [16-28] 18 (12/08 0329) BP: (77-92)/(46-67) 92/56 (12/08 0830) SpO2:  [90 %-100 %] 93 % (12/08 0830) Weight:  [114.3 kg (252 lb)-120.9 kg (266 lb 8.6 oz)] 114.3 kg (252 lb) (12/08 0329)  Weight change: -1.3 kg (-2 lb 13.9 oz) Filed Weights   07/16/16 1455 07/16/16 1812 07/17/16 0329  Weight: 120.9 kg (266 lb 8.6 oz) 117.3 kg (258 lb 9.6 oz) 114.3 kg (252 lb)    Intake/Output: I/O last 3 completed shifts: In: 600 [P.O.:600] Out: 3686 [Other:3686]   Intake/Output this shift:  Total I/O In: 120 [P.O.:120] Out: -   Physical Exam: General: NAD, sitting up in bed  Head: Normocephalic, atraumatic. Moist oral mucosal membranes  Eyes: Anicteric, PERRL  Neck: Supple, trachea midline  Lungs:  Bilateral crackles  Heart: Regular rate and rhythm  Abdomen:  Soft, nontender, +distended  Extremities:  +++ peripheral edema.  Neurologic: Nonfocal, moving all four extremities  Skin: No lesions  Access: Right arm AVF    Basic Metabolic Panel:  Recent Labs Lab 07/13/16 1645 07/14/16 0353 07/14/16 1500 07/15/16 0615 07/16/16 0454 07/17/16 0428  NA 133* 134*  --  134* 136  135 131*  K 3.3* 3.1*  --  3.8 4.2  4.2 3.9  CL 90* 92*  --  94* 96*  96* 92*  CO2 32 34*  --  33* 28  32 31  GLUCOSE 77 127*  --  127* 55*  57* 120*  BUN 15 18  --  18 16  15 16   CREATININE 3.21* 3.73*  --  3.44* 3.11*  3.23* 2.88*  CALCIUM 8.6* 8.3*  --  8.6* 8.7*  8.5* 8.6*  MG 2.1  --  2.2  --   --   --   PHOS 1.9*  --  3.0  --  2.3*  --     Liver Function Tests:  Recent Labs Lab 07/13/16 1645 07/16/16 0454  AST 41  --   ALT 17  --   ALKPHOS 96  --   BILITOT 3.2*  --   PROT 9.2*  --    ALBUMIN 2.7* 2.5*   No results for input(s): LIPASE, AMYLASE in the last 168 hours. No results for input(s): AMMONIA in the last 168 hours.  CBC:  Recent Labs Lab 07/13/16 1645 07/14/16 0353 07/16/16 0454  WBC 5.7 6.1 6.4  HGB 12.3* 11.4* 11.2*  HCT 37.3* 34.7* 35.6*  MCV 88.6 90.3 90.9  PLT 148* 139* 136*    Cardiac Enzymes:  Recent Labs Lab 07/13/16 1645  TROPONINI <0.03    BNP: Invalid input(s): POCBNP  CBG:  Recent Labs Lab 07/16/16 0651 07/16/16 0801 07/16/16 1115 07/16/16 2049 07/17/16 0740  GLUCAP 89 97 94 116* 112*    Microbiology: Results for orders placed or performed during the hospital encounter of 07/13/16  MRSA PCR Screening     Status: None   Collection Time: 07/13/16 10:11 PM  Result Value Ref Range Status   MRSA by PCR NEGATIVE NEGATIVE Final    Comment:        The GeneXpert MRSA Assay (FDA approved for NASAL specimens only), is one component of a  comprehensive MRSA colonization surveillance program. It is not intended to diagnose MRSA infection nor to guide or monitor treatment for MRSA infections.     Coagulation Studies: No results for input(s): LABPROT, INR in the last 72 hours.  Urinalysis: No results for input(s): COLORURINE, LABSPEC, PHURINE, GLUCOSEU, HGBUR, BILIRUBINUR, KETONESUR, PROTEINUR, UROBILINOGEN, NITRITE, LEUKOCYTESUR in the last 72 hours.  Invalid input(s): APPERANCEUR    Imaging: No results found.   Medications:    . albumin human  12.5 g Intravenous Once  . amiodarone  200 mg Oral Daily  . aspirin EC  81 mg Oral Daily  . atorvastatin  20 mg Oral QHS  . docusate  100 mg Oral Daily  . feeding supplement (ENSURE ENLIVE)  237 mL Oral BID BM  . heparin  5,000 Units Subcutaneous Q8H  . hydrocortisone cream  1 application Topical TID  . insulin aspart  0-9 Units Subcutaneous TID WC  . mouth rinse  15 mL Mouth Rinse BID  . sodium chloride flush  3 mL Intravenous Q12H   albuterol,  guaiFENesin-codeine  Assessment/ Plan:  [name] with ESRD on hemodialysis, atrial fibrillation, mural thrombus, systolic congestive heart failure, cocaine abuse  [facility] [address] [facility]  1. ESRD with volume overload and acute pulmonary edema. Daily dialysis. UF since admission 12 litres - hemodialysis today. Plan for daily dialysis - IV albumin with treatment.   2. Systolic congestive heart failure: acute exacerbation - echocardiogram 20-25%  - appreciate cardiology input  3. Hypotension: due to congestive heart failure.   4. Anemia of chronic kidney disease: hemoglobin at goal, 11.4 - hold epo  5. Secondary Hyperparathyroidism:  - held sevelamer  6. Hyponatremia: from volume overload.   LOS: 4 [name] 12/8/201710:24 AM

## 2016-07-17 NOTE — Progress Notes (Signed)
Patient called out wanting to use the bathroom. Due to the patient being a high fall risk and a 2 assist, RN instructed the patient to use the bedpan. Patient continually refused and argued with nursing staff, insistent on using the [facility] or sitting in a chair to use the bedpan. Patient verbally threatened he was going to "tear this room up" if not given the option to use the [facility]. Nursing staff convinced patient to lay back in bed when patient briefly passed out. CBG 160, BP 81/55, HR 96, SpO2 92% on 3L [place]. Patient stated, "Lord take me now, why do you keep me here." RN notified MD Hugelmeyer and nursing supervisor of the situation; nursing supervisor spoke with the patient. Per MD, monitor for any further changes in patient status and notify as needed. Patient is currently resting in bed in no acute distress with the bed alarm on. Lamonte Richer, RN

## 2016-07-17 NOTE — Progress Notes (Signed)
Post HD assessment unchanged  

## 2016-07-18 LAB — BASIC METABOLIC PANEL
ANION GAP: 9 (ref 5–15)
BUN: 17 mg/dL (ref 6–20)
CALCIUM: 8.4 mg/dL — AB (ref 8.9–10.3)
CO2: 29 mmol/L (ref 22–32)
Chloride: 93 mmol/L — ABNORMAL LOW (ref 101–111)
Creatinine, Ser: 3.07 mg/dL — ABNORMAL HIGH (ref 0.61–1.24)
GFR, EST AFRICAN AMERICAN: 23 mL/min — AB (ref >60)
GFR, EST NON AFRICAN AMERICAN: 20 mL/min — AB (ref >60)
GLUCOSE: 237 mg/dL — AB (ref 65–99)
POTASSIUM: 4.2 mmol/L (ref 3.5–5.1)
Sodium: 131 mmol/L — ABNORMAL LOW (ref 135–145)

## 2016-07-18 LAB — GLUCOSE, CAPILLARY
GLUCOSE-CAPILLARY: 190 mg/dL — AB (ref 65–99)
GLUCOSE-CAPILLARY: 182 mg/dL — AB (ref 65–99)
Glucose-Capillary: 153 mg/dL — ABNORMAL HIGH (ref 65–99)

## 2016-07-18 MED ORDER — ALBUMIN HUMAN 25 % IV SOLN
12.5000 g | Freq: Once | INTRAVENOUS | Status: AC
  Administered 2016-07-18: 12.5 g via INTRAVENOUS
  Filled 2016-07-18: qty 50

## 2016-07-18 NOTE — Progress Notes (Signed)
HD initiated without issue via RFA AVF. Patient has no specific complaints this am but states "Im just in a bad mood." Given warm blankets. Currently sleeping. Continue to monitor.

## 2016-07-18 NOTE — Progress Notes (Signed)
Hemodialysis- Patient chose to end treatment early. "I've got to get up. " "If you don't take me off and let me get up im going to tear this place apart, I told the girl last time and she didn't believe me" Patient continued to raise voice and use profanity. Treatment discontinued per patient request with 45 minutes remaining. Needles pulled and dressings applied. While holding pressure patient falling in and out of sleep. Repositioned in bed for comfort. Vitals WNL, report called to primary RN. Will notify Dr. Wynelle Link as well.

## 2016-07-18 NOTE — Progress Notes (Signed)
Patient returned from dialysis, no distress noted.  Had extra large BM.

## 2016-07-18 NOTE — Progress Notes (Signed)
Post HD assessment unchanged  

## 2016-07-18 NOTE — Progress Notes (Signed)
[facility]  ROUNDING NOTE   Subjective:   Seen and examined on hemodialysis. Tolerating treatment well. UF goal of 5 litres.  Daily dialysis: 4 treatments so far: UF total of 16.7 kg removed on this admission.    Objective:  Vital signs in last 24 hours:  Temp:  [97.4 F (36.3 C)-98.4 F (36.9 C)] 98.1 F (36.7 C) (12/09 1000) Pulse Rate:  [90-101] 97 (12/09 1128) Resp:  [15-21] 20 (12/09 1128) BP: (72-88)/(44-64) 88/63 (12/09 1128) SpO2:  [92 %-100 %] 100 % (12/09 1128) Weight:  [111.1 kg (245 lb)-114.6 kg (252 lb 10.4 oz)] 111.1 kg (245 lb) (12/09 1000)  Weight change: -6.3 kg (-13 lb 14.2 oz) Filed Weights   07/17/16 1325 07/18/16 0413 07/18/16 1000  Weight: 114.6 kg (252 lb 10.4 oz) 113.8 kg (250 lb 12.8 oz) 111.1 kg (245 lb)    Intake/Output: I/O last 3 completed shifts: In: 120 [P.O.:120] Out: 4115 [Other:4115]   Intake/Output this shift:  Total I/O In: 240 [P.O.:240] Out: -   Physical Exam: General: NAD, sitting up in bed  Head: Normocephalic, atraumatic. Moist oral mucosal membranes  Eyes: Anicteric, PERRL  Neck: Supple, trachea midline  Lungs:  Bilateral crackles  Heart: Regular rate and rhythm  Abdomen:  Soft, nontender, +distended  Extremities:  ++ peripheral edema.  Neurologic: Nonfocal, moving all four extremities  Skin: No lesions  Access: Right arm AVF    Basic Metabolic Panel:  Recent Labs Lab 07/13/16 1645 07/14/16 0353 07/14/16 1500 07/15/16 0615 07/16/16 0454 07/17/16 0428 07/18/16 0457  NA 133* 134*  --  134* 136  135 131* 131*  K 3.3* 3.1*  --  3.8 4.2  4.2 3.9 4.2  CL 90* 92*  --  94* 96*  96* 92* 93*  CO2 32 34*  --  33* 28  32 31 29  GLUCOSE 77 127*  --  127* 55*  57* 120* 237*  BUN 15 18  --  18 16  15 16 17   CREATININE 3.21* 3.73*  --  3.44* 3.11*  3.23* 2.88* 3.07*  CALCIUM 8.6* 8.3*  --  8.6* 8.7*  8.5* 8.6* 8.4*  MG 2.1  --  2.2  --   --   --   --   PHOS 1.9*  --  3.0  --  2.3*  --   --      Liver Function Tests:  Recent Labs Lab 07/13/16 1645 07/16/16 0454  AST 41  --   ALT 17  --   ALKPHOS 96  --   BILITOT 3.2*  --   PROT 9.2*  --   ALBUMIN 2.7* 2.5*   No results for input(s): LIPASE, AMYLASE in the last 168 hours. No results for input(s): AMMONIA in the last 168 hours.  CBC:  Recent Labs Lab 07/13/16 1645 07/14/16 0353 07/16/16 0454  WBC 5.7 6.1 6.4  HGB 12.3* 11.4* 11.2*  HCT 37.3* 34.7* 35.6*  MCV 88.6 90.3 90.9  PLT 148* 139* 136*    Cardiac Enzymes:  Recent Labs Lab 07/13/16 1645  TROPONINI <0.03    BNP: Invalid input(s): POCBNP  CBG:  Recent Labs Lab 07/17/16 1227 07/17/16 1814 07/17/16 2100 07/17/16 2331 07/18/16 0803  GLUCAP 169* 132* 177* 160* 153*    Microbiology: Results for orders placed or performed during the hospital encounter of 07/13/16  MRSA PCR Screening     Status: None   Collection Time: 07/13/16 10:11 PM  Result Value Ref Range Status  MRSA by PCR NEGATIVE NEGATIVE Final    Comment:        The GeneXpert MRSA Assay (FDA approved for NASAL specimens only), is one component of a comprehensive MRSA colonization surveillance program. It is not intended to diagnose MRSA infection nor to guide or monitor treatment for MRSA infections.     Coagulation Studies: No results for input(s): LABPROT, INR in the last 72 hours.  Urinalysis: No results for input(s): COLORURINE, LABSPEC, PHURINE, GLUCOSEU, HGBUR, BILIRUBINUR, KETONESUR, PROTEINUR, UROBILINOGEN, NITRITE, LEUKOCYTESUR in the last 72 hours.  Invalid input(s): APPERANCEUR    Imaging: No results found.   Medications:    . amiodarone  200 mg Oral Daily  . aspirin EC  81 mg Oral Daily  . atorvastatin  20 mg Oral QHS  . docusate  100 mg Oral Daily  . feeding supplement (ENSURE ENLIVE)  237 mL Oral BID BM  . heparin  5,000 Units Subcutaneous Q8H  . hydrocortisone cream  1 application Topical TID  . insulin aspart  0-9 Units Subcutaneous TID  WC  . mouth rinse  15 mL Mouth Rinse BID  . sodium chloride flush  3 mL Intravenous Q12H   albuterol, guaiFENesin-codeine  Assessment/ Plan:  [name] with ESRD on hemodialysis, atrial fibrillation, mural thrombus, systolic congestive heart failure, cocaine abuse  MWF [facility] [address] [facility]  1. ESRD with volume overload and acute pulmonary edema. Daily dialysis. UF since admission 16.7 litres - hemodialysis today. - Next treatment for Monday - IV albumin with treatment.   2. Systolic congestive heart failure: acute exacerbation - echocardiogram 20-25%  - appreciate cardiology input  3. Hypotension: due to congestive heart failure.   4. Anemia of chronic kidney disease: hemoglobin at goal, 11.2 - hold epo  5. Secondary Hyperparathyroidism: phos low at 2.3 - held sevelamer  6. Hyponatremia: from volume overload.   LOS: 5 [name] 12/9/201711:30 AM

## 2016-07-18 NOTE — Progress Notes (Signed)
Patient blood pressure reported to Dr. Luberta Mutter, no new order as patient is asymptomatic. Will continue to monitor.

## 2016-07-18 NOTE — Progress Notes (Signed)
Notified MD Pyreddy of decreased BP 73/53, all other VSS; patient's baseline is closer to 90s/70s. Patient is in no acute distress, however he did complain of "not feeling right" and that earlier he "lost control" of his hands while drinking some juice. No new orders received at this time. Nursing staff will continue to monitor for any changes in patient status and will notify MD accordingly. Lamonte Richer, RN

## 2016-07-18 NOTE — Progress Notes (Signed)
[facility] - [place] at [facility]   PATIENT NAME: [name] MaceKenneth [name]    MR#:  [id number]  DATE OF BIRTH:  10/17/1950  SUBJECTIVE: Seen during hemodialysis, patient is sleeping. BP low today.   Came with increasing SOB and edmea-generalized  REVIEW OF SYSTEMS:   Review of Systems  Constitutional: Negative for chills, fever and weight loss.  HENT: Negative for ear discharge, ear pain and nosebleeds.   Eyes: Negative for blurred vision, pain and discharge.  Respiratory: Positive for shortness of breath. Negative for sputum production, wheezing and stridor.   Cardiovascular: Positive for orthopnea, leg swelling and PND. Negative for chest pain and palpitations.  Gastrointestinal: Negative for abdominal pain, diarrhea, nausea and vomiting.  Genitourinary: Negative for frequency and urgency.  Musculoskeletal: Negative for back pain and joint pain.  Neurological: Positive for weakness. Negative for sensory change, speech change and focal weakness.  Psychiatric/Behavioral: Negative for depression and hallucinations. The patient is not nervous/anxious.    Tolerating Diet:yes Tolerating PT: pending  DRUG ALLERGIES:  No Known Allergies  VITALS:  Blood pressure (!) 80/65, pulse 97, temperature 98.1 F (36.7 C), temperature source Oral, resp. rate (!) 22, height 6' (1.829 m), weight 111.1 kg (245 lb), SpO2 100 %.  PHYSICAL EXAMINATION:   Physical Exam  GENERAL:  65 y.o.-year-old patient lying in the bed with no acute distress. Chronically ill, Anasarca EYES: Pupils equal, round, reactive to light and accommodation. No scleral icterus. Extraocular muscles intact.  HEENT: Head atraumatic, normocephalic. Oropharynx and nasopharynx clear.  NECK:  Supple, no jugular venous distention. No thyroid enlargement, no tenderness.  LUNGS: decreased breath sounds bilaterally, no wheezing, rales, rhonchi. No use of accessory muscles of respiration.  CARDIOVASCULAR: S1, S2  normal. No murmurs, rubs, or gallops.  ABDOMEN: Soft, nontender, nondistended. Bowel sounds present. No organomegaly or mass.  EXTREMITIES: No cyanosis, clubbing  ++++ edema b/l.    NEUROLOGIC: Cranial nerves II through XII are intact. No focal Motor or sensory deficits b/l.   PSYCHIATRIC:  patient is alert and oriented x 3.  SKIN: No obvious rash, lesion, or ulcer.   LABORATORY PANEL:  CBC  Recent Labs Lab 07/16/16 0454  WBC 6.4  HGB 11.2*  HCT 35.6*  PLT 136*    Chemistries   Recent Labs Lab 07/13/16 1645  07/14/16 1500  07/18/16 0457  NA 133*  < >  --   < > 131*  K 3.3*  < >  --   < > 4.2  CL 90*  < >  --   < > 93*  CO2 32  < >  --   < > 29  GLUCOSE 77  < >  --   < > 237*  BUN 15  < >  --   < > 17  CREATININE 3.21*  < >  --   < > 3.07*  CALCIUM 8.6*  < >  --   < > 8.4*  MG 2.1  --  2.2  --   --   AST 41  --   --   --   --   ALT 17  --   --   --   --   ALKPHOS 96  --   --   --   --   BILITOT 3.2*  --   --   --   --   < > = values in this interval not displayed. Cardiac Enzymes  Recent Labs Lab 07/13/16 1645  TROPONINI <0.03  RADIOLOGY:  No results found. ASSESSMENT AND PLAN:   [name]  is a 65 y.o. male with a known history of Chronic anemia, severe cardiomyopathy with ejection fraction less than 15%, chronic kidney disease and ESRD on hemodialysis, diabetes, there abuse, hyperlipidemia, hypertension- claims to be compliant with his dialysis in his medications. He started noticing generalized swelling and edema for last few days. Also feeling short of breath so came to emergency room.  * Acute on chronic systolic congestive heart failure   Acute hypoxic respiratory failure   Anasarca with end-stage renal disease.   -Nephrology consult for dialysis with UF of about  12 litressince admisison   Cardiology consult for severe cardiomyopathy--was suppose to be on dobutamine and Milrinone gtt but never got started since pt maintained his bp   Continue  baseline cardiac medications -amiodarone  * Hypotension   With extremely low EF his blood pressure is running up to 80s systolic but patient is currently alert and oriented -cont cardiac meds Midodrine to help with BO:FBPZ talk to nephrology.  * Diabetes';; resolved hypoglycemia.   PT consult;pt has no place to go,  * severe malnutrition on IV albumin for hypotension and poor volume status due to cardiomyopathy -dietitian to follwo  CM for d/c planning; [place] [facility] when arrangements are made  Case discussed with Care Management/Social Worker. Management plans discussed with the patient, family and they are in agreement.  CODE STATUS:DNR  DVT Prophylaxis: heparin  TOTAL critical TIME TAKING CARE OF THIS PATIENT: 35 minutes.  >50% time spent on counselling and coordination of care  POSSIBLE D/C IN 2-3 DAYS, DEPENDING ON CLINICAL CONDITION.  Note: This dictation was prepared with Dragon dictation along with smaller phrase technology. Any transcriptional errors that result from this process are unintentional.  Daylah Sayavong M.D on 07/18/2016 at 12:26 PM  Between 7am to 6pm - Pager - [phone]  After 6pm go to [email or web address] - password [email or web address]  [facility]  Office  [phone]  CC: Primary care physician; Dimple Casey, MD

## 2016-07-18 NOTE — Progress Notes (Signed)
Pre dialysis  

## 2016-07-18 NOTE — Progress Notes (Signed)
Pre dialysis assessment 

## 2016-07-19 DIAGNOSIS — I471 Supraventricular tachycardia: Secondary | ICD-10-CM

## 2016-07-19 DIAGNOSIS — D638 Anemia in other chronic diseases classified elsewhere: Secondary | ICD-10-CM

## 2016-07-19 LAB — BASIC METABOLIC PANEL
ANION GAP: 6 (ref 5–15)
BUN: 20 mg/dL (ref 6–20)
CALCIUM: 8.7 mg/dL — AB (ref 8.9–10.3)
CO2: 32 mmol/L (ref 22–32)
Chloride: 95 mmol/L — ABNORMAL LOW (ref 101–111)
Creatinine, Ser: 3.01 mg/dL — ABNORMAL HIGH (ref 0.61–1.24)
GFR calc Af Amer: 23 mL/min — ABNORMAL LOW (ref >60)
GFR, EST NON AFRICAN AMERICAN: 20 mL/min — AB (ref >60)
GLUCOSE: 165 mg/dL — AB (ref 65–99)
Potassium: 3.9 mmol/L (ref 3.5–5.1)
SODIUM: 133 mmol/L — AB (ref 135–145)

## 2016-07-19 LAB — GLUCOSE, CAPILLARY
Glucose-Capillary: 128 mg/dL — ABNORMAL HIGH (ref 65–99)
Glucose-Capillary: 181 mg/dL — ABNORMAL HIGH (ref 65–99)
Glucose-Capillary: 180 mg/dL — ABNORMAL HIGH (ref 65–99)
Glucose-Capillary: 162 mg/dL — ABNORMAL HIGH (ref 65–99)

## 2016-07-19 MED ORDER — LACTULOSE 10 GM/15ML PO SOLN
20.0000 g | Freq: Two times a day (BID) | ORAL | Status: DC | PRN
  Administered 2016-07-19 – 2016-07-30 (×2): 20 g via ORAL
  Filled 2016-07-19 (×2): qty 30

## 2016-07-19 NOTE — Progress Notes (Signed)
Patient Name: [name] Date of Encounter: 07/19/2016  Primary Cardiologist: Kateri Mc and Nicholes Mango, MD  Hospital Problem List     Principal Problem:   Acute respiratory failure [facility]) Active Problems:   Acute on chronic systolic congestive heart failure (HCC)   PAF (paroxysmal atrial fibrillation) (HCC)   Type 2 diabetes mellitus with diabetic nephropathy, without long-term current use of insulin (HCC)   ESRD (Zunaira Lamy stage renal disease) on dialysis (HCC)   Pressure injury of skin   NICM (nonischemic cardiomyopathy) (HCC)   H/O noncompliance with medical treatment, presenting hazards to health   Anemia of chronic disease   Protein-calorie malnutrition, severe (HCC)   Malnutrition of moderate degree   Liver dysfunction   Noncompliance with renal dialysis [facility])   Subjective   Patient feels well. No chest pain or shortness of breath. Leg swelling much improved since admission, though a small amount remains. No lightheadedness or dizziness.  Inpatient Medications    Scheduled Meds: . amiodarone  200 mg Oral Daily  . aspirin EC  81 mg Oral Daily  . atorvastatin  20 mg Oral QHS  . docusate  100 mg Oral Daily  . feeding supplement (ENSURE ENLIVE)  237 mL Oral BID BM  . heparin  5,000 Units Subcutaneous Q8H  . hydrocortisone cream  1 application Topical TID  . insulin aspart  0-9 Units Subcutaneous TID WC  . mouth rinse  15 mL Mouth Rinse BID  . sodium chloride flush  3 mL Intravenous Q12H   Continuous Infusions:  PRN Meds: albuterol, guaiFENesin-codeine   Vital Signs    Vitals:   07/18/16 2116 07/19/16 0339 07/19/16 0500 07/19/16 0803  BP:  (!) 87/57  (!) 76/56  Pulse:  100  99  Resp:  18    Temp:  98.9 F (37.2 C)  98.1 F (36.7 C)  TempSrc:  Oral  Oral  SpO2: 92% 91%  99%  Weight:   241 lb 3.2 oz (109.4 kg)   Height:        Intake/Output Summary (Last 24 hours) at 07/19/16 1146 Last data filed at 07/19/16 1019  Gross per 24 hour  Intake               582 ml  Output             3290 ml  Net            -2708 ml   Filed Weights   07/18/16 1000 07/18/16 1327 07/19/16 0500  Weight: 245 lb (111.1 kg) 240 lb (108.9 kg) 241 lb 3.2 oz (109.4 kg)    Physical Exam   GEN: Chronically ill man seated comfortably in bed. HEENT: Grossly normal.  Neck: JVP to the earlobe 1 seated upright with positive HJR. Supple. Cardiac: Distant heart sounds; regular rate and rhythm without murmurs. 2+ woody pretibial edema bilaterally.  Right forearm dialysis graft with thrill present. Respiratory:  Respirations regular and unlabored, clear to auscultation bilaterally. GI: Soft, nontender, nondistended, BS + x 4. MS: no deformity or atrophy. Skin: warm and dry, no rash. Neuro:  Strength and sensation are intact. Psych: AAOx3.  Normal affect.  Labs    CBC No results for input(s): WBC, NEUTROABS, HGB, HCT, MCV, PLT in the last 72 hours. Basic Metabolic Panel  Recent Labs  07/18/16 0457 07/19/16 0449  NA 131* 133*  K 4.2 3.9  CL 93* 95*  CO2 29 32  GLUCOSE 237* 165*  BUN 17 20  CREATININE 3.07*  3.01*  CALCIUM 8.4* 8.7*    Telemetry    Minimal heart rate variability near 100 bpm. Suspect atypical atrial flutter versus atrial tachycardia- Personally Reviewed  ECG    07/15/16: Atypical atrial flutter versus atrial tachycardia with 2:1 conduction. Low voltage and incomplete left bundle branch block. No significant change in rhythm since at least 05/04/16. - Personally Reviewed  Radiology    No results found.  Cardiac Studies   Transthoracic echocardiogram (07/14/16): Moderately dilated LV with severely reduced contraction (EF less than 20%). Mild MR. Mild left atrial enlargement. Severely dilated RV with moderately severely reduced contraction. Mildly dilated RA. Moderate TR. At least moderate pulmonary hypertension. Moderate pericardial effusion. - Personally reviewed.  Patient Profile     65 y.o. man with history of chronic systolic CHF with  EF < 20% previously on chronic dobutamine infusion in summer of 2015, NICM, cardiogenic shock previously requiring IABP, ESRD on HD (MWF), PAF/VT on amiodarone previously on warfarin, history of mural thrombus previously on warfarin, anemia of chronic disease, malnutrition, prior cocaine abuse, and medication noncompliance presenting significant health hazards who [facility] with SOB and LE swelling found to have acute on chronic systolic CHF and anasarca with ESRD on HD.  Assessment & Plan    Acute on chronic systolic heart failure: Patient feels much improved since his presentation but continues to have evidence of volume overload, including lower extremity edema. JVP is also elevated, though some of this may be related to at least moderate tricuspid regurgitation. Moderate pericardial effusion also noted on recent echo.  Continue fluid removal with hemodialysis.  Unable to initiate any evidence based heart failure therapy due to hypotension.  Pericardial effusion: Moderate pericardial effusion noted on recent echocardiogram without evidence of significantly increased intrapericardial pressure. This is most likely due to renal disease with noncompliance with hemodialysis as well as severe biventricular failure.  Continue regular hemodialysis.  Atypical atrial flutter versus atrial tachycardia: Review of the patient's EKG today and several prior tracings reveals minimal heart rate variability. This most likely reflects atrial tachycardia versus an atypical flutter that has been present since at least 04/2016. He remains on amiodarone given history of VT and atrial fibrillation. He is not currently on therapeutic anticoagulation due to noncompliance with warfarin.  Consider EP consultation. Could consider cardioversion in the future if patient can demonstrate compliance with medications and follow-up, as he would need to be maintained on warfarin chronically.  Continue amiodarone  for now.  No good options for rate control given hypotension and renal failure.  History of mural thrombus: Not identified on recent echocardiogram. Patient previously on warfarin but not compliant with medications/follow-up.  Consider reinitiation of warfarin if patient is able to maintain compliance. This may be facilitated by placement of a skilled nursing facility after discharge.  Mariacristina Aday-stage renal disease on hemodialysis:  Per nephrology.  Chronic anemia: Likely due to chronic disease, including CKD.  Per nephrology and hospitalist service.  Signed, Yvonne Kendallhristopher Rori Goar, MD  07/19/2016, 11:46 AM

## 2016-07-19 NOTE — Plan of Care (Signed)
Problem: Education: Goal: Knowledge of [facility] Education information/materials will improve Outcome: Not Progressing Pt does not adhere to MD orders regarding his fluid restriction, pt continuously ask for water/juice/soda.   Problem: Safety: Goal: Ability to remain free from injury will improve Outcome: Progressing Bed alarm set, sitter at bedside. Call bell within reach.  Problem: Physical Regulation: Goal: Ability to maintain clinical measurements within normal limits will improve Outcome: Not Progressing OOB with max assist, lift needed. Pt dangled legs at side of bed over night.   Problem: Skin Integrity: Goal: Risk for impaired skin integrity will decrease Outcome: Not Progressing Pt with c/o itching, cream applied per MD order. Pt with persistent scratching.   Problem: Bowel/Gastric: Goal: Will not experience complications related to bowel motility Outcome: Progressing Pt with large BM on day shift.

## 2016-07-19 NOTE — Progress Notes (Signed)
PT Cancellation Note  Patient Details Name: [name] MRN: [id number] DOB: 08/05/1951   Cancelled Treatment:    Reason Eval/Treat Not Completed: Other (comment). Chart reviewed, RN consulted. Dr. Wynelle Link asking PT to see the patient over weekend for treatment while on a break from HD. Upon entry, pt immediately refusing PT, reporting he is trying to sleep, has not been getting much rest, and is exhausted. PT explained the importance of participating in simple mobility to maintain strength but he is again uninterested. Will attempt again at later date/time.    11:09 AM, 07/19/16 Rosamaria Lints, PT, DPT Physical Therapist - [place] [phone] (mobile)

## 2016-07-19 NOTE — Progress Notes (Signed)
Pt's last BP was 73/65mmHg. Per morning shift RN, MD Luberta Mutter was aware and no orders were made as pt was asymptomatic. BP now is 79/53mmHg, pt remains asymptomatic with 3Lpm O2 inhalation. Will continue to monitor.

## 2016-07-19 NOTE — Progress Notes (Signed)
[facility] - [place] [facility]   PATIENT NAME: [name]    MR#:  [id number]  DATE OF BIRTH:  Jan 31, 1951  SUBJECTIVE: Still has some cough. Did not work with physical therapy..   Came with increasing SOB and edmea-generalized  REVIEW OF SYSTEMS:   Review of Systems  Constitutional: Negative for chills, fever and weight loss.  HENT: Negative for ear discharge, ear pain and nosebleeds.   Eyes: Negative for blurred vision, pain and discharge.  Respiratory: Positive for shortness of breath. Negative for sputum production, wheezing and stridor.   Cardiovascular: Positive for orthopnea, leg swelling and PND. Negative for chest pain and palpitations.  Gastrointestinal: Negative for abdominal pain, diarrhea, nausea and vomiting.  Genitourinary: Negative for frequency and urgency.  Musculoskeletal: Negative for back pain and joint pain.  Neurological: Positive for weakness. Negative for sensory change, speech change and focal weakness.  Psychiatric/Behavioral: Negative for depression and hallucinations. The patient is not nervous/anxious.    Tolerating Diet:yes Tolerating PT: pending  DRUG ALLERGIES:  No Known Allergies  VITALS:  Blood pressure (!) 73/50, pulse 97, temperature 98 F (36.7 C), temperature source Oral, resp. rate 19, height 6' (1.829 m), weight 109.4 kg (241 lb 3.2 oz), SpO2 100 %.  PHYSICAL EXAMINATION:   Physical Exam  GENERAL:  65 y.o.-year-old patient lying in the bed with no acute distress. Chronically ill, Anasarca EYES: Pupils equal, round, reactive to light and accommodation. No scleral icterus. Extraocular muscles intact.  HEENT: Head atraumatic, normocephalic. Oropharynx and nasopharynx clear.  NECK:  Supple, no jugular venous distention. No thyroid enlargement, no tenderness.  LUNGS: decreased breath sounds bilaterally, no wheezing, rales, rhonchi. No use of accessory muscles of respiration.  CARDIOVASCULAR: S1, S2  normal. No murmurs, rubs, or gallops.  ABDOMEN: Soft, nontender, nondistended. Bowel sounds present. No organomegaly or mass.  EXTREMITIES: No cyanosis, clubbing  ++++ edema b/l.    NEUROLOGIC: Cranial nerves II through XII are intact. No focal Motor or sensory deficits b/l.   PSYCHIATRIC:  patient is alert and oriented x 3.  SKIN: No obvious rash, lesion, or ulcer.   LABORATORY PANEL:  CBC  Recent Labs Lab 07/16/16 0454  WBC 6.4  HGB 11.2*  HCT 35.6*  PLT 136*    Chemistries   Recent Labs Lab 07/13/16 1645  07/14/16 1500  07/19/16 0449  NA 133*  < >  --   < > 133*  K 3.3*  < >  --   < > 3.9  CL 90*  < >  --   < > 95*  CO2 32  < >  --   < > 32  GLUCOSE 77  < >  --   < > 165*  BUN 15  < >  --   < > 20  CREATININE 3.21*  < >  --   < > 3.01*  CALCIUM 8.6*  < >  --   < > 8.7*  MG 2.1  --  2.2  --   --   AST 41  --   --   --   --   ALT 17  --   --   --   --   ALKPHOS 96  --   --   --   --   BILITOT 3.2*  --   --   --   --   < > = values in this interval not displayed. Cardiac Enzymes  Recent Labs Lab 07/13/16 1645  TROPONINI <0.03   RADIOLOGY:  No results found. ASSESSMENT AND PLAN:   [name]  is a 65 y.o. male with a known history of Chronic anemia, severe cardiomyopathy with ejection fraction less than 15%, chronic kidney disease and ESRD on hemodialysis, diabetes, there abuse, hyperlipidemia, hypertension- claims to be compliant with his dialysis in his medications. He started noticing generalized swelling and edema for last few days. Also feeling short of breath so came to emergency room.  * Acute on chronic systolic congestive heart failure   Acute hypoxic respiratory failure   Anasarca with end-stage renal disease.Getting hemodialysis, total ultrafiltration since admission 19.9 KG    -  * Diabetes';; resolved hypoglycemia. *History of atrial flutter versus atrial tachycardia: Continue amiodarone, consider EP consultation, History of mural thrombus  is not identified on recent echo, will start the warfarin, patient noncompliant  PT consult;waiting for rehabilitation placement  * severe malnutrition on IV albumin for hypotension and poor volume status due to cardiomyopathy -dietitian to follwo  CM for d/c planning; [facility] when arrangements are made  Case discussed with Care Management/Social Worker. Management plans discussed with the patient, family and they are in agreement.  CODE STATUS:DNR  DVT Prophylaxis: heparin  TOTAL critical TIME TAKING CARE OF THIS PATIENT: 35 minutes.  >50% time spent on counselling and coordination of care  POSSIBLE D/C IN 2-3 DAYS, DEPENDING ON CLINICAL CONDITION.  Note: This dictation was prepared with Dragon dictation along with smaller phrase technology. Any transcriptional errors that result from this process are unintentional.  [name] M.D on 07/19/2016 at 1:07 PM  Between 7am to 6pm - Pager - [phone]  After 6pm go to [email or web address] - password [email or web address] [facility]  [facility]  Office  [phone]  CC: Primary care physician; Dimple CaseySean A Smith, MD

## 2016-07-19 NOTE — Progress Notes (Signed)
[facility]  ROUNDING NOTE   Subjective:   Refused PT this morning.  Hemodialysis treatment yesterday. Asked to be taken off early. UF of 3.289 - total UF since admission of 19.9kg.    Objective:  Vital signs in last 24 hours:  Temp:  [98 F (36.7 C)-99.1 F (37.3 C)] 98.1 F (36.7 C) (12/10 0803) Pulse Rate:  [94-100] 99 (12/10 0803) Resp:  [18-23] 18 (12/10 0339) BP: (73-124)/(41-103) 76/56 (12/10 0803) SpO2:  [91 %-100 %] 99 % (12/10 0803) Weight:  [108.9 kg (240 lb)-109.4 kg (241 lb 3.2 oz)] 109.4 kg (241 lb 3.2 oz) (12/10 0500)  Weight change: -3.469 kg (-7 lb 10.4 oz) Filed Weights   07/18/16 1000 07/18/16 1327 07/19/16 0500  Weight: 111.1 kg (245 lb) 108.9 kg (240 lb) 109.4 kg (241 lb 3.2 oz)    Intake/Output: I/O last 3 completed shifts: In: 462 [P.O.:462] Out: 3290 [Other:3289; Stool:1]   Intake/Output this shift:  Total I/O In: 360 [P.O.:360] Out: -   Physical Exam: General: NAD, sitting up in bed  Head: Normocephalic, atraumatic. Moist oral mucosal membranes  Eyes: Anicteric, PERRL  Neck: Supple, trachea midline  Lungs:  Bilateral crackles  Heart: Regular rate and rhythm  Abdomen:  Soft, nontender, +distended  Extremities:  ++ peripheral edema.  Neurologic: Nonfocal, moving all four extremities  Skin: No lesions  Access: Right arm AVF    Basic Metabolic Panel:  Recent Labs Lab 07/13/16 1645  07/14/16 1500 07/15/16 0615 07/16/16 0454 07/17/16 0428 07/18/16 0457 07/19/16 0449  NA 133*  < >  --  134* 136  135 131* 131* 133*  K 3.3*  < >  --  3.8 4.2  4.2 3.9 4.2 3.9  CL 90*  < >  --  94* 96*  96* 92* 93* 95*  CO2 32  < >  --  33* 28  32 31 29 32  GLUCOSE 77  < >  --  127* 55*  57* 120* 237* 165*  BUN 15  < >  --  18 16  15 16 17 20   CREATININE 3.21*  < >  --  3.44* 3.11*  3.23* 2.88* 3.07* 3.01*  CALCIUM 8.6*  < >  --  8.6* 8.7*  8.5* 8.6* 8.4* 8.7*  MG 2.1  --  2.2  --   --   --   --   --   PHOS 1.9*  --  3.0  --   2.3*  --   --   --   < > = values in this interval not displayed.  Liver Function Tests:  Recent Labs Lab 07/13/16 1645 07/16/16 0454  AST 41  --   ALT 17  --   ALKPHOS 96  --   BILITOT 3.2*  --   PROT 9.2*  --   ALBUMIN 2.7* 2.5*   No results for input(s): LIPASE, AMYLASE in the last 168 hours. No results for input(s): AMMONIA in the last 168 hours.  CBC:  Recent Labs Lab 07/13/16 1645 07/14/16 0353 07/16/16 0454  WBC 5.7 6.1 6.4  HGB 12.3* 11.4* 11.2*  HCT 37.3* 34.7* 35.6*  MCV 88.6 90.3 90.9  PLT 148* 139* 136*    Cardiac Enzymes:  Recent Labs Lab 07/13/16 1645  TROPONINI <0.03    BNP: Invalid input(s): POCBNP  CBG:  Recent Labs Lab 07/17/16 2331 07/18/16 0803 07/18/16 1718 07/18/16 2030 07/19/16 0744  GLUCAP 160* 153* 190* 182* 128*    Microbiology: Results for  orders placed or performed during the hospital encounter of 07/13/16  MRSA PCR Screening     Status: None   Collection Time: 07/13/16 10:11 PM  Result Value Ref Range Status   MRSA by PCR NEGATIVE NEGATIVE Final    Comment:        The GeneXpert MRSA Assay (FDA approved for NASAL specimens only), is one component of a comprehensive MRSA colonization surveillance program. It is not intended to diagnose MRSA infection nor to guide or monitor treatment for MRSA infections.     Coagulation Studies: No results for input(s): LABPROT, INR in the last 72 hours.  Urinalysis: No results for input(s): COLORURINE, LABSPEC, PHURINE, GLUCOSEU, HGBUR, BILIRUBINUR, KETONESUR, PROTEINUR, UROBILINOGEN, NITRITE, LEUKOCYTESUR in the last 72 hours.  Invalid input(s): APPERANCEUR    Imaging: No results found.   Medications:    . amiodarone  200 mg Oral Daily  . aspirin EC  81 mg Oral Daily  . atorvastatin  20 mg Oral QHS  . docusate  100 mg Oral Daily  . feeding supplement (ENSURE ENLIVE)  237 mL Oral BID BM  . heparin  5,000 Units Subcutaneous Q8H  . hydrocortisone cream  1  application Topical TID  . insulin aspart  0-9 Units Subcutaneous TID WC  . mouth rinse  15 mL Mouth Rinse BID  . sodium chloride flush  3 mL Intravenous Q12H   albuterol, guaiFENesin-codeine  Assessment/ Plan:  [name] with ESRD on hemodialysis, atrial fibrillation, mural thrombus, systolic congestive heart failure, cocaine abuse  [facility]  1. ESRD with volume overload and acute pulmonary edema. Daily dialysis. UF since admission 19.9 litres - Next treatment for Monday - IV albumin with treatment.   2. Systolic congestive heart failure: acute exacerbation - echocardiogram 20-25%  - appreciate cardiology input  3. Hypotension: due to congestive heart failure.   4. Anemia of chronic kidney disease: hemoglobin at goal, 11.2 - hold epo  5. Secondary Hyperparathyroidism: phos low at 2.3 - held sevelamer  6. Hyponatremia: from volume overload.   LOS: 6 [name] 12/10/201711:33 AM

## 2016-07-20 LAB — GLUCOSE, CAPILLARY
GLUCOSE-CAPILLARY: 153 mg/dL — AB (ref 65–99)
GLUCOSE-CAPILLARY: 102 mg/dL — AB (ref 65–99)
GLUCOSE-CAPILLARY: 105 mg/dL — AB (ref 65–99)
Glucose-Capillary: 120 mg/dL — ABNORMAL HIGH (ref 65–99)

## 2016-07-20 LAB — CBC
HCT: 34 % — ABNORMAL LOW (ref 40.0–52.0)
Hemoglobin: 11.1 g/dL — ABNORMAL LOW (ref 13.0–18.0)
MCH: 28.9 pg (ref 26.0–34.0)
MCHC: 32.5 g/dL (ref 32.0–36.0)
MCV: 88.9 fL (ref 80.0–100.0)
PLATELETS: 154 10*3/uL (ref 150–440)
RBC: 3.83 MIL/uL — AB (ref 4.40–5.90)
RDW: 17.3 % — AB (ref 11.5–14.5)
WBC: 6.1 10*3/uL (ref 3.8–10.6)

## 2016-07-20 LAB — RENAL FUNCTION PANEL
ALBUMIN: 2.7 g/dL — AB (ref 3.5–5.0)
Anion gap: 8 (ref 5–15)
BUN: 28 mg/dL — AB (ref 6–20)
CALCIUM: 8.8 mg/dL — AB (ref 8.9–10.3)
CHLORIDE: 93 mmol/L — AB (ref 101–111)
CO2: 31 mmol/L (ref 22–32)
CREATININE: 3.88 mg/dL — AB (ref 0.61–1.24)
GFR, EST AFRICAN AMERICAN: 17 mL/min — AB (ref >60)
GFR, EST NON AFRICAN AMERICAN: 15 mL/min — AB (ref >60)
Glucose, Bld: 152 mg/dL — ABNORMAL HIGH (ref 65–99)
Phosphorus: 2 mg/dL — ABNORMAL LOW (ref 2.5–4.6)
Potassium: 4.4 mmol/L (ref 3.5–5.1)
SODIUM: 132 mmol/L — AB (ref 135–145)

## 2016-07-20 MED ORDER — ALBUMIN HUMAN 25 % IV SOLN
12.5000 g | Freq: Once | INTRAVENOUS | Status: AC
  Administered 2016-07-20: 12.5 g via INTRAVENOUS
  Filled 2016-07-20: qty 50

## 2016-07-20 MED ORDER — WARFARIN SODIUM 3 MG PO TABS: 3.0000 mg | ORAL_TABLET | Freq: Every day | ORAL | 0 refills | Status: DC

## 2016-07-20 MED ORDER — INSULIN ASPART 100 UNIT/ML ~~LOC~~ SOLN: 0.0000 [IU] | Freq: Three times a day (TID) | SUBCUTANEOUS | 11 refills | Status: AC

## 2016-07-20 MED ORDER — ENSURE ENLIVE PO LIQD
237.0000 mL | Freq: Three times a day (TID) | ORAL | Status: DC
  Administered 2016-07-21 – 2016-07-30 (×22): 237 mL via ORAL

## 2016-07-20 MED ORDER — ENSURE ENLIVE PO LIQD: 237.0000 mL | Freq: Two times a day (BID) | ORAL | 12 refills | Status: AC

## 2016-07-20 MED ORDER — LORAZEPAM 2 MG/ML IJ SOLN
2.0000 mg | Freq: Four times a day (QID) | INTRAMUSCULAR | Status: DC | PRN
  Administered 2016-07-20 (×2): 2 mg via INTRAVENOUS
  Filled 2016-07-20 (×3): qty 1

## 2016-07-20 NOTE — Discharge Planning (Signed)
Patient BP still 74/56, therefore unable to DC to facility yet.  [facility] contacted to inform BP needs to be at least 80 systolic for DC. Facility stated they would not be able to take him back after 7PM, therefore DC will probably be tomorrow.  S/W Alphonzo Lemmings, RN (Scientific laboratory technician).

## 2016-07-20 NOTE — Progress Notes (Signed)
Pre dialysis  

## 2016-07-20 NOTE — Progress Notes (Signed)
Pt yelling, becoming increasingly agitated, getting out of bed and pulling out telemetry leads despite reorientation. Oncall MD Pyreddy paged and ordered for Ativan 2mg  IV every 6hrs PRN for agitation. Will administer as ordered, bed alarms kept on and continue to monitor.

## 2016-07-20 NOTE — Progress Notes (Signed)
[facility]  ROUNDING NOTE   Subjective:   Patient underwent HD earlier today 5000 cc of fluid was removed States that he still feels volume overload and is not able to breath normal Sitting up in bed eating dinner    Objective:  Vital signs in last 24 hours:  Temp:  [98.1 F (36.7 C)-98.5 F (36.9 C)] 98.1 F (36.7 C) (12/11 1425) Pulse Rate:  [94-99] 96 (12/11 1548) Resp:  [16-26] 18 (12/11 1425) BP: (73-94)/(49-69) 76/58 (12/11 1548) SpO2:  [87 %-100 %] 99 % (12/11 1425) Weight:  [105.2 kg (231 lb 14.8 oz)-110.2 kg (243 lb)] 105.2 kg (231 lb 14.8 oz) (12/11 1345)  Weight change: -0.907 kg (-2 lb) Filed Weights   07/20/16 0435 07/20/16 0931 07/20/16 1345  Weight: 110.2 kg (243 lb) 110.2 kg (243 lb) 105.2 kg (231 lb 14.8 oz)    Intake/Output: I/O last 3 completed shifts: In: 702 [P.O.:702] Out: 1 [Stool:1]   Intake/Output this shift:  Total I/O In: 120 [P.O.:120] Out: 5000 [Other:5000]  Physical Exam: General: NAD, sitting up in bed  Head: Normocephalic, atraumatic. Moist oral mucosal membranes  Eyes: Anicteric,    Neck: Supple, trachea midline  Lungs:  Bilateral crackles  Heart: irregular rhythm  Abdomen:  Soft, nontender, +distended  Extremities:  ++ peripheral edema.  Neurologic: Nonfocal, moving all four extremities  Skin: No lesions  Access: Right arm AVF    Basic Metabolic Panel:  Recent Labs Lab 07/13/16 1645  07/14/16 1500  07/16/16 0454 07/17/16 0428 07/18/16 0457 07/19/16 0449 07/20/16 0332  NA 133*  < >  --   < > 136  135 131* 131* 133* 132*  K 3.3*  < >  --   < > 4.2  4.2 3.9 4.2 3.9 4.4  CL 90*  < >  --   < > 96*  96* 92* 93* 95* 93*  CO2 32  < >  --   < > 28  32 31 29 32 31  GLUCOSE 77  < >  --   < > 55*  57* 120* 237* 165* 152*  BUN 15  < >  --   < > 16  15 16 17 20  28*  CREATININE 3.21*  < >  --   < > 3.11*  3.23* 2.88* 3.07* 3.01* 3.88*  CALCIUM 8.6*  < >  --   < > 8.7*  8.5* 8.6* 8.4* 8.7* 8.8*  MG 2.1   --  2.2  --   --   --   --   --   --   PHOS 1.9*  --  3.0  --  2.3*  --   --   --  2.0*  < > = values in this interval not displayed.  Liver Function Tests:  Recent Labs Lab 07/13/16 1645 07/16/16 0454 07/20/16 0332  AST 41  --   --   ALT 17  --   --   ALKPHOS 96  --   --   BILITOT 3.2*  --   --   PROT 9.2*  --   --   ALBUMIN 2.7* 2.5* 2.7*   No results for input(s): LIPASE, AMYLASE in the last 168 hours. No results for input(s): AMMONIA in the last 168 hours.  CBC:  Recent Labs Lab 07/13/16 1645 07/14/16 0353 07/16/16 0454 07/20/16 0332  WBC 5.7 6.1 6.4 6.1  HGB 12.3* 11.4* 11.2* 11.1*  HCT 37.3* 34.7* 35.6* 34.0*  MCV 88.6 90.3 90.9  88.9  PLT 148* 139* 136* 154    Cardiac Enzymes:  Recent Labs Lab 07/13/16 1645  TROPONINI <0.03    BNP: Invalid input(s): POCBNP  CBG:  Recent Labs Lab 07/19/16 1209 07/19/16 1719 07/19/16 2129 07/20/16 0758 07/20/16 1421  GLUCAP 181* 180* 162* 153* 102*    Microbiology: Results for orders placed or performed during the hospital encounter of 07/13/16  MRSA PCR Screening     Status: None   Collection Time: 07/13/16 10:11 PM  Result Value Ref Range Status   MRSA by PCR NEGATIVE NEGATIVE Final    Comment:        The GeneXpert MRSA Assay (FDA approved for NASAL specimens only), is one component of a comprehensive MRSA colonization surveillance program. It is not intended to diagnose MRSA infection nor to guide or monitor treatment for MRSA infections.     Coagulation Studies: No results for input(s): LABPROT, INR in the last 72 hours.  Urinalysis: No results for input(s): COLORURINE, LABSPEC, PHURINE, GLUCOSEU, HGBUR, BILIRUBINUR, KETONESUR, PROTEINUR, UROBILINOGEN, NITRITE, LEUKOCYTESUR in the last 72 hours.  Invalid input(s): APPERANCEUR    Imaging: No results found.   Medications:    . amiodarone  200 mg Oral Daily  . aspirin EC  81 mg Oral Daily  . atorvastatin  20 mg Oral QHS  . docusate   100 mg Oral Daily  . feeding supplement (ENSURE ENLIVE)  237 mL Oral TID BM  . heparin  5,000 Units Subcutaneous Q8H  . hydrocortisone cream  1 application Topical TID  . insulin aspart  0-9 Units Subcutaneous TID WC  . mouth rinse  15 mL Mouth Rinse BID  . sodium chloride flush  3 mL Intravenous Q12H   albuterol, guaiFENesin-codeine, lactulose, LORazepam  Assessment/ Plan:  Mr. Trevor MaceKenneth Mera is a 65 y.o. black male with ESRD on hemodialysis, atrial fibrillation, mural thrombus, systolic congestive heart failure, cocaine abuse  [facility] [address] [facility]  1. ESRD with volume overload and acute pulmonary edema. Daily dialysis. UF since admission  Almost 22 litres - Next treatment tomorrow for volume removal - IV albumin with treatment.   2. Systolic congestive heart failure: acute exacerbation - echocardiogram < 20 %  - appreciate cardiology input  3. Hypotension: due to congestive heart failure.   4. Anemia of chronic kidney disease: hemoglobin at goal, 11.1 - hold epo  5. Secondary Hyperparathyroidism: phos low at 2.0 - held sevelamer  6. Hyponatremia: from volume overload.   LOS: 7 Taelor Moncada 12/11/20174:21 PM

## 2016-07-20 NOTE — Progress Notes (Signed)
Patient Name: [name] MaceKenneth [name] Date of Encounter: 07/20/2016  [facility]  Hospital Problem List     Principal Problem:   Acute respiratory failure [facility](HCC) Active Problems:   NICM (nonischemic cardiomyopathy) (HCC)   H/O noncompliance with medical treatment, presenting hazards to health   Protein-calorie malnutrition, severe (HCC)   Anemia of chronic disease   Acute on chronic systolic congestive heart failure (HCC)   PAF (paroxysmal atrial fibrillation) (HCC)   Type 2 diabetes mellitus with diabetic nephropathy, without long-term current use of insulin (HCC)   ESRD (end stage renal disease) on dialysis (HCC)   Pressure injury of skin   Malnutrition of moderate degree   Liver dysfunction   Noncompliance with renal dialysis (HCC)     Subjective   Agitated overnight. Sleepy this morning. No complaints this morning.   Inpatient Medications    Scheduled Meds: . amiodarone  200 mg Oral Daily  . aspirin EC  81 mg Oral Daily  . atorvastatin  20 mg Oral QHS  . docusate  100 mg Oral Daily  . feeding supplement (ENSURE ENLIVE)  237 mL Oral BID BM  . heparin  5,000 Units Subcutaneous Q8H  . hydrocortisone cream  1 application Topical TID  . insulin aspart  0-9 Units Subcutaneous TID WC  . mouth rinse  15 mL Mouth Rinse BID  . sodium chloride flush  3 mL Intravenous Q12H   Continuous Infusions:  PRN Meds: albuterol, guaiFENesin-codeine, lactulose, LORazepam   Vital Signs    Vitals:   07/19/16 1219 07/19/16 1954 07/20/16 0435 07/20/16 0541  BP: (!) 73/50 (!) 79/55  93/66  Pulse: 97 99  96  Resp: 19 18  20   Temp: 98 F (36.7 C) 98.4 F (36.9 C)  98.3 F (36.8 C)  TempSrc: Oral Oral  Oral  SpO2: 100% 99%  (!) 87%  Weight:   243 lb (110.2 kg)   Height:        Intake/Output Summary (Last 24 hours) at 07/20/16 0746 Last data filed at 07/19/16 2153  Gross per 24 hour  Intake              480 ml  Output                1 ml  Net              479 ml     Filed Weights   07/18/16 1327 07/19/16 0500 07/20/16 0435  Weight: 240 lb (108.9 kg) 241 lb 3.2 oz (109.4 kg) 243 lb (110.2 kg)    Physical Exam    GEN: Well nourished, well developed, in no acute distress.  HEENT: Grossly normal.  Neck: Supple, JVD elevated, no carotid bruits, or masses. Cardiac: RRR, no murmurs, rubs, or gallops. No clubbing or cyanosis. 1+ pitting LE edema persists.   Respiratory:  Respirations regular and unlabored, clear to auscultation bilaterally. GI: Soft, nontender, distended, BS + x 4. MS: no deformity or atrophy. Skin: warm and dry, no rash. Neuro:  Strength and sensation are intact. Psych: AAOx3.  Normal affect.  Labs    CBC  Recent Labs  07/20/16 0332  WBC 6.1  HGB 11.1*  HCT 34.0*  MCV 88.9  PLT 154   Basic Metabolic Panel  Recent Labs  07/19/16 0449 07/20/16 0332  NA 133* 132*  K 3.9 4.4  CL 95* 93*  CO2 32 31  GLUCOSE 165* 152*  BUN 20 28*  CREATININE 3.01* 3.88*  CALCIUM  8.7* 8.8*  PHOS  --  2.0*   Liver Function Tests  Recent Labs  07/20/16 0332  ALBUMIN 2.7*   No results for input(s): LIPASE, AMYLASE in the last 72 hours. Cardiac Enzymes No results for input(s): CKTOTAL, CKMB, CKMBINDEX, TROPONINI in the last 72 hours. BNP Invalid input(s): POCBNP D-Dimer No results for input(s): DDIMER in the last 72 hours. Hemoglobin A1C No results for input(s): HGBA1C in the last 72 hours. Fasting Lipid Panel No results for input(s): CHOL, HDL, LDLCALC, TRIG, CHOLHDL, LDLDIRECT in the last 72 hours. Thyroid Function Tests No results for input(s): TSH, T4TOTAL, T3FREE, THYROIDAB in the last 72 hours.  Invalid input(s): FREET3  Telemetry    Sinus rhythm, 90's bpm - Personally Reviewed  ECG    n/a - Personally Reviewed  Radiology    No results found.  Cardiac Studies   Transthoracic echocardiogram (07/14/16): Moderately dilated LV with severely reduced contraction (EF less than 20%). Mild MR. Mild left atrial  enlargement. Severely dilated RV with moderately severely reduced contraction. Mildly dilated RA. Moderate TR. At least moderate pulmonary hypertension. Moderate pericardial effusion.   Patient Profile     65 y.o. male with history of chronic systolic CHF with EF <20% previously on chronic dobutamine infusion in summer of 2015, NICM, cardiogenic shock previously requiring IABP, ESRD on HD (MWF), PAF/VT on amiodarone previously on warfarin, history of mural thrombus previously on warfarin, anemia of chronic disease, malnutrition, prior cocaine abuse, and medication noncompliance presenting significant health hazards who presented to [facility] on 12/4 with SOB and LE swelling found to have acute on chronic systolic CHF and anasarca with ESRD on HD.  Assessment & Plan    Acute on chronic systolic heart failure: Patient feels much improved since his presentation but continues to have evidence of volume overload, including lower extremity edema. JVP is also elevated, though some of this may be related to at least moderate tricuspid regurgitation. Moderate pericardial effusion also noted on recent echo.  Continue fluid removal with hemodialysis.  Unable to initiate any evidence based heart failure therapy due to hypotension.  Pericardial effusion: Moderate pericardial effusion noted on recent echocardiogram without evidence of significantly increased intrapericardial pressure. This is most likely due to renal disease with noncompliance with hemodialysis as well as severe biventricular failure.  Continue regular hemodialysis.  Atypical atrial flutter versus atrial tachycardia: Review of patient's prior EKGs reveals minimal heart rate variability. This may reflect atrial tachycardia versus an atypical flutter that has been present since at least 04/2016. He remains on amiodarone given history of VT and atrial fibrillation. He is not currently on therapeutic anticoagulation due to noncompliance with  warfarin.  Sinus rhythm on tele  Consider EP consultation. Could consider cardioversion in the future if patient can demonstrate compliance with medications and follow-up, as he would need to be maintained on warfarin chronically.  Continue amiodarone for now.  No good options for rate control given hypotension and renal failure.  History of mural thrombus: Not identified on recent echocardiogram. Patient previously on warfarin but not compliant with medications/follow-up.  Consider reinitiation of warfarin if patient is able to maintain compliance. This may be facilitated by placement of a skilled nursing facility after discharge.  End-stage renal disease on hemodialysis:  Per nephrology.  Chronic anemia: Likely due to chronic disease, including CKD.  Per nephrology and hospitalist service.   Signed, Eula Listen, PA-C [facility] Pager: [phone] 07/20/2016, 7:46 AM

## 2016-07-20 NOTE — Progress Notes (Signed)
Post dialysis 

## 2016-07-20 NOTE — Clinical Social Work Note (Signed)
Patient to be d/c'ed today to [facility] pending blood pressure values and if physician feels patient is ready for discharge.  atient and family agreeable to plans will transport via ems RN to call report to [phone].  Windell Moulding, MSW Mon-Fri 8a-4:30p [phone]

## 2016-07-20 NOTE — Progress Notes (Signed)
Pre HD assessment  

## 2016-07-20 NOTE — Clinical Social Work Note (Signed)
Clinical Social Work Assessment  Patient Details  Name: [name] MRN: [id number] Date of Birth: Aug 09, 1951  Date of referral:  07/20/16               Reason for consult:  Facility Placement                Permission sought to share information with:  Family Supports, Magazine features editor Permission granted to share information::     Name::     Jerffries,[name] Daughter [phone]  [phone]   Agency::  SNF admissions  Relationship::     Contact Information:     Housing/Transportation Living arrangements for the past 2 months:  Skilled Building surveyor of Information:  Facility, Medical Team Patient Interpreter Needed:  None Criminal Activity/Legal Involvement Pertinent to Current Situation/Hospitalization:  No - Comment as needed Significant Relationships:  Adult Children Lives with:  Facility Resident Do you feel safe going back to the place where you live?  Yes Need for family participation in patient care:  No (Coment)  Care giving concerns:  Patient plans to return back to SNF   Social Worker assessment / plan:  Patient is a 65 year old long term care resident at [facility].  Patient is alert and oriented x2 and able to express his feelings.  Patient plans to return back to [facility].  MSW contacted SNF so they can begin insurance reauthorzization.  SNF reported they were able to get insurance approval again.  MSW was given permission to update [place] health care on patient's progress. Employment status:  Retired Database administrator PT Recommendations:  Skilled Nursing Facility Information / Referral to community resources:  Skilled Nursing Facility  Patient/Family's Response to care:  Patient agreeable to returning back to SNF.  Patient/Family's Understanding of and Emotional Response to Diagnosis, Current Treatment, and Prognosis: Patient is aware of current treatment plan.  Emotional  Assessment Appearance:  Appears stated age Attitude/Demeanor/Rapport:    Affect (typically observed):  Appropriate, Calm Orientation:  Oriented to Self, Oriented to Place Alcohol / Substance use:  Not Applicable Psych involvement (Current and /or in the community):  No (Comment)  Discharge Needs  Concerns to be addressed:  No discharge needs identified Readmission within the last 30 days:  No Current discharge risk:  None Barriers to Discharge:  No Barriers Identified   Darleene Cleaver 07/20/2016, 5:17 PM

## 2016-07-20 NOTE — Care Management Important Message (Signed)
Important Message  Patient Details  Name: [name] MRN: [id number] Date of Birth: [birth year]   Medicare Important Message Given:  Yes    Carlisle Torgeson A, RN 07/20/2016, 7:53 AM

## 2016-07-20 NOTE — Progress Notes (Signed)
Physical Therapy Treatment Patient Details Name: [name] MaceKenneth [name] MRN: [id number] DOB: [birth year] Today's Date: 07/20/2016    History of Present Illness Pt is a 65 y/o M who noticed generalized swelling and edema for a few days along with SOB.  In the ER he was hypoxic requiring 3-4 L O2.  Pt has been receiving HD treatment while in the hospital.  Pt with ESRD with volume overload and acute pulmonary edema and acute exacerbation of systolic congestive heart failure.  Pt's PMH includes chronic hypotension due to very low EF, a-fib, cocaine abuse.    PT Comments    Participated in exercises as described below.  Discussed with primary nurse who reported +3 assist with staff yesterday to commode.  Pt to edge of bed with mod a x 1 and increased assist.  Pt c/o edema in testicles that is limiting his mobility and causing discomfort. Once sitting he is able to maintain his balance with supervision.  Stood x 1 with walker and mod a x 2.  He is able to remain standing and take small steps to reposition his feet but unable to ambulate.  Wide base of support due to edema.  Pt is appropriate for lift with staff at this time for pt and staff safety.   Follow Up Recommendations  SNF     Equipment Recommendations       Recommendations for Other Services       Precautions / Restrictions Precautions Precautions: Fall Restrictions Weight Bearing Restrictions: No    Mobility  Bed Mobility Overal bed mobility: Needs Assistance Bed Mobility: Supine to Sit;Sit to Supine     Supine to sit: Mod assist;HOB elevated Sit to supine: Mod assist;+2 for physical assistance   General bed mobility comments: increased time  Transfers Overall transfer level: Needs assistance Equipment used: Rolling walker (2 wheeled) Transfers: Sit to/from Stand Sit to Stand: +2 physical assistance;Mod assist         General transfer comment: NSG reports +3 assist transfer yesterday to commode.  Sit to stand lift in  room for use.   PT able to stand today and weight shift but unable to step due to fatigue.  Ambulation/Gait             General Gait Details: unable to ambulate today   Stairs            Wheelchair Mobility    Modified Rankin (Stroke Patients Only)       Balance Overall balance assessment: Needs assistance Sitting-balance support: Single extremity supported;Feet supported Sitting balance-Leahy Scale: Poor Sitting balance - Comments: Relies on at least 1 UE supported on bed while sitting EOB   Standing balance support: Bilateral upper extremity supported;During functional activity Standing balance-Leahy Scale: Poor                      Cognition Arousal/Alertness: Lethargic Behavior During Therapy: WFL for tasks assessed/performed Overall Cognitive Status: Within Functional Limits for tasks assessed                      Exercises General Exercises - Lower Extremity Ankle Circles/Pumps: AAROM;Both;10 reps;Supine Heel Slides: AAROM;Supine;Both;10 reps Hip ABduction/ADduction: AAROM;Supine;Both;10 reps Straight Leg Raises: AAROM;Supine;Both;10 reps    General Comments        Pertinent Vitals/Pain Faces Pain Scale: Hurts even more Pain Location: swollen testicles, general discomfort Pain Descriptors / Indicators: Grimacing;Sore Pain Intervention(s): Limited activity within patient's tolerance    Home Living  Prior Function            PT Goals (current goals can now be found in the care plan section)      Frequency    Min 2X/week      PT Plan Current plan remains appropriate    Co-evaluation             End of Session Equipment Utilized During Treatment: Gait belt;Oxygen Activity Tolerance: Patient limited by fatigue Patient left: in bed;with call bell/phone within reach;with bed alarm set     Time: 4097-3532 PT Time Calculation (min) (ACUTE ONLY): 23 min  Charges:  $Therapeutic  Exercise: 8-22 mins $Therapeutic Activity: 8-22 mins                    G Codes:      Danielle Dess 08/06/2016, 11:11 AM

## 2016-07-20 NOTE — Care Management Note (Signed)
Case Management Note  Patient Details  Name: [name] MRN: [id number] Date of Birth: 1950/08/21  Subjective/Objective:      [name] is being discharged to [facility] today. However, after dialysis today [name] BP=73/46. No fluid boluses. Hospital staff is monitoring BP. Per hospital policy, [name] cannot be discharged until his SBP=80 at least.               Action/Plan:   Expected Discharge Date:                  Expected Discharge Plan:     In-House Referral:     Discharge planning Services     Post Acute Care Choice:    Choice offered to:     DME Arranged:    DME Agency:     HH Arranged:    HH Agency:     Status of Service:     If discussed at [employer], dates discussed:    Additional Comments:  Ashliegh Parekh A, RN 07/20/2016, 3:06 PM

## 2016-07-20 NOTE — Progress Notes (Signed)
Nutrition Follow-up  DOCUMENTATION CODES:   Non-severe (moderate) malnutrition in context of chronic illness  INTERVENTION:  1. Increase Ensure Enlive to po TID, each supplement provides 350 kcal and 20 grams of protein 2. Magic cup TID with meals, each supplement provides 290 kcal and 9 grams of protein 3. Encourage PO intake - appetite poor 4. Recommend Continue to hold Phos binders - Phos 2.0 this morning  NUTRITION DIAGNOSIS:   Malnutrition related to chronic illness as evidenced by moderate depletion of body fat, moderate depletions of muscle mass, severe fluid accumulation. -ongoing  GOAL:   Patient will meet greater than or equal to 90% of their needs -progressing  MONITOR:   PO intake, Supplement acceptance, Labs, Weight trends  REASON FOR ASSESSMENT:   Malnutrition Screening Tool    ASSESSMENT:   65 yo male admitted with acute on chronic CHF, severe anasarca with ESRD on HD. Pt with CHF with EF 15%, CKD, HTN, HLD  Mr. Wurdeman continues to have poor appetite.  States he Did not eat any breakfast - but meal completion documented 50% this morning. Had a lunch tray at bedside - was falling asleep after dialysis. Per NT - he did not eat any dinner last night either. He is consuming Ensures - will continue to provide. Down 15kgs thus far - arms are less edematous but legs still pitting.  Intake/Output Summary (Last 24 hours) at 07/20/16 1439 Last data filed at 07/20/16 1345  Gross per 24 hour  Intake              240 ml  Output             5001 ml  Net            -4761 ml   Labs and medications reviewed: Na 132, Phos 2.0 Colace  Diet Order:  Diet renal/carb modified with fluid restriction Diet-HS Snack? Nothing; Room service appropriate? Yes; Fluid consistency: Thin; Fluid restriction: 1200 mL Fluid  Skin:  Wound (see comment) (stage II coccyx)  Last BM:  12/10  Height:   Ht Readings from Last 1 Encounters:  07/13/16 6' (1.829 m)    Weight:   Wt  Readings from Last 1 Encounters:  07/20/16 231 lb 14.8 oz (105.2 kg)    Ideal Body Weight:     BMI:  Body mass index is 31.45 kg/m.  Estimated Nutritional Needs:   Kcal:  2200-2500 kcals  Protein:  110-125 g   Fluid:  1000 mL plus UOP  EDUCATION NEEDS:   No education needs identified at this time  Dionne Ano. Makena Murdock, MS, RD LDN Inpatient Clinical Dietitian Pager [phone]

## 2016-07-20 NOTE — Progress Notes (Signed)
HD initiated without issue. Dr. Thedore Mins notified of bp (70s-80s systolic). Order for albumin placed. Pharmacy notified will bring to HD unit. Pt currently asymptomatic. BP 77/58, HR 96, 100% on 3L [place].

## 2016-07-20 NOTE — Discharge Planning (Signed)
Patient has been given ok by Dr. To DC back to [facility] today, after BP increases to at least 80 systolic.  Currently 73/46.   Low BP is baseline for patient and he is asymptomatic, however still unable to give boluses d/t patient having ESR and CHF.  RN will continue to assess and discharge when able.  SW informed of situation.

## 2016-07-20 NOTE — Discharge Summary (Signed)
[name], is a 65 y.o. male  DOB October 26, 1950  MRN [id number].  Admission date:  67/11/2015  Admitting Physician  Altamese Dilling, MD  Discharge Date:  07/20/2016   Primary MD  Dimple Casey, MD  Recommendations for primary care physician for things to follow: \ Follow-up with primary doctor in 1 week.  follow up  With [facility] cardiology in one week    Admission Diagnosis  Acute on chronic systolic congestive heart failure (HCC) [I50.23] Acute respiratory failure with hypoxia (HCC) [J96.01] Other hypervolemia [E87.79]   Discharge Diagnosis  Acute on chronic systolic congestive heart failure (HCC) [I50.23] Acute respiratory failure with hypoxia (HCC) [J96.01] Other hypervolemia [E87.79]  Principal Problem:   Acute respiratory failure (HCC) Active Problems:   Acute on chronic systolic congestive heart failure (HCC)   PAF (paroxysmal atrial fibrillation) (HCC)   Type 2 diabetes mellitus with diabetic nephropathy, without long-term current use of insulin (HCC)   ESRD (end stage renal disease) on dialysis (HCC)   Pressure injury of skin   NICM (nonischemic cardiomyopathy) (HCC)   H/O noncompliance with medical treatment, presenting hazards to health   Anemia of chronic disease   Protein-calorie malnutrition, severe (HCC)   Malnutrition of moderate degree   Liver dysfunction   Noncompliance with renal dialysis [facility])      Past Medical History:  Diagnosis Date  . Anemia of chronic disease   . Apical mural thrombus   . Arthritis   . Chronic systolic heart failure (HCC)    a) ECHO [facility] 02/2014): EF <15%, multiple apical thrombi, RV mod enlarged, mod MR b. RHC (02/21/14) at [facility]: RA 16, RV 64/9 (15), PA 64/32 (42), PCWP: 24, CI: 1.4  . Diabetes mellitus with complication (HCC)   . Drug use    cocaine   . ESRD on hemodialysis (HCC)    a. MWF  . H/O noncompliance with medical treatment, presenting hazards to health   . HLD (hyperlipidemia)   . HTN (hypertension)   . Intrahepatic cholestasis 02/20/2016  . Mitral regurgitation   . PAF (paroxysmal atrial fibrillation) (HCC)   . Pneumonia     Past Surgical History:  Procedure Laterality Date  . AV FISTULA PLACEMENT Right 06/27/2014   Procedure: ARTERIOVENOUS (AV) FISTULA CREATION VERSUS GRAFT INSERTION;  Surgeon: Pryor Ochoa, MD;  Location: [facility] OR;  Service: Vascular;  Laterality: Right;  . COLONOSCOPY    . FISTULA SUPERFICIALIZATION Right 10/25/2014   Procedure: RIGHT ARM RADIOCEPHALIC FISTULA SUPERFICIALIZATION;  Surgeon: Pryor Ochoa, MD;  Location: [facility] OR;  Service: Vascular;  Laterality: Right;  . INSERTION OF DIALYSIS CATHETER  12/15       History of present illness and  Hospital Course:     Kindly see H&P for history of present illness and admission details, please review complete Labs, Consult reports and Test reports for all details in brief  HPI  from the history and physical done on the day of admission  65 year old male patient with history of chronic systolic heart failure with EF less than 20%, previously on dobutamine infusions, nonischemic cardiomyopathy, ESRD on hemodialysis Monday,, Wednesday ,Friday, history of V. tach on amiodarone, previously was a warfarin came in because of shortness of breath, pedal edema, found to have acute on chronic systolic heart failure, anasarca with ESRD.   Hospital Course  #1 acute on chronic systolic heart failure: Patient admitted to hospitalist service for this. Ultrafiltration since admission 19.9 L. Patient had volume overload with acute pulmonary edema  on admission. And has generalized and anasarca. Received IV albumin and also with the treatment. And [facility]  [address] [place] [facility] for dialysis needs. Patient did have dialysis today that is  Monday. And dialysis will be givenat December 13.  #2 systolic heart failure with acute exacerbation: EF 20-25%. Seen by cardiology. Patient has history of nonischemic cardiomyopathy, noncompliance with medication treatment, patient has h/o chronic dobutamine infusion in 2015. Patient feels much better since admission. But continues to have elevated JVP, lower extremity edema may be related to M moderate tricuspid regurgitation, moderate pericardial effusion. Cardiology advised fluid removal with dialysis, unable to initiate evidence based  Heart failure medicines because of hypotension.   #3 moderate pericardial effusion secondary to noncompliance with hemodialysis, severe biventricular failure.   #4 atypical atrial flutter versus atrial tachycardia: Patient remains on amiodarone given history of V. tach. Not on the mechanical ventilation because of noncompliance with warfarin. Sinus rhythm on telemetry. Patient needs outpatient cardiology evaluation for EP studies and cardioversion if he can differentiate complaints with medicines and follow-ups.  5.History of mural thrombosis; restart the Coumadin because patient is going to nursing home.  Chronic anemia due to chronic kidney disease:  #6. Moderate malnutrition: Seen by dietitian. Started on Magic 3 times a day with meals,ensure enlive  mouth twice a day.   #7 .diabetes mellitus type 2: Have hypoglycemic episodes. Continue sliding scale with coverage only.  Code status is DO NOT RESUSCITATE.  #8 high risk for recurrent admissions because of severe cardiomyopathy with poor EF, ESRD. Discharge Condition: stable   Follow UP; check PT, INR and adjust the Coumadin. INR goal between 2-3      Discharge Instructions  and  Discharge Medications  Low sodium ADA diet Follow up with cardio in 10 days Routine HD on Monday, Wednesday, Friday.     Medication List    STOP taking these medications   insulin NPH Human 100 UNIT/ML  injection Commonly known as:  HUMULIN N,NOVOLIN N     TAKE these medications   acetaminophen 325 MG tablet Commonly known as:  TYLENOL Take 650 mg by mouth every 6 (six) hours as needed for mild pain or moderate pain.   amiodarone 200 MG tablet Commonly known as:  PACERONE Take 1 tablet (200 mg total) by mouth daily.   aspirin 81 MG EC tablet Take 1 tablet (81 mg total) by mouth daily.   atorvastatin 20 MG tablet Commonly known as:  LIPITOR Take 20 mg by mouth at bedtime.   feeding supplement (ENSURE ENLIVE) Liqd Take 237 mLs by mouth 2 (two) times daily between meals.   guaiFENesin-codeine 100-10 MG/5ML syrup Commonly known as:  ROBITUSSIN AC Take 5 mLs by mouth every 6 (six) hours as needed for cough.   hydrocortisone cream 1 % Apply 1 application topically 3 (three) times daily.   insulin aspart 100 UNIT/ML injection Commonly known as:  novoLOG Inject 0-9 Units into the skin 3 (three) times daily with meals. What changed:  how much to take  when to take this   Melatonin 3 MG Tabs Take 3 mg by mouth at bedtime.   sevelamer carbonate 800 MG tablet Commonly known as:  RENVELA Take 3 tablets (2,400 mg total) by mouth 3 (three) times daily with meals.   warfarin 3 MG tablet Commonly known as:  COUMADIN Take 1 tablet (3 mg total) by mouth daily.         Diet and Activity recommendation: See  Discharge Instructions above   Consults obtained - cardiology nephrology, nutritionist   Major procedures and Radiology Reports - PLEASE review detailed and final reports for all details, in brief -      Dg Chest Port 1 View  Result Date: 07/13/2016 CLINICAL DATA:  65 y/o  M; shortness of breath. EXAM: PORTABLE CHEST 1 VIEW COMPARISON:  06/12/2016 chest radiograph and 06/10/2014. FINDINGS: Severe cardiomegaly much increased in size from 2015, question pericardial effusion. Small left and trace right pleural effusions. Associated basilar opacities may represent  atelectasis or pneumonia. No significant interval change. Bones are unremarkable. AICD lead noted. IMPRESSION: 1. Severe cardiomegaly much increased in size from 2015, question pericardial effusion. 2. Small left and trace right pleural effusions with associated basilar opacities are stable and may represent atelectasis or pneumonia. Electronically Signed   By: Mitzi Hansen M.D.   On: 07/13/2016 17:27    Micro Results     Recent Results (from the past 240 hour(s))  MRSA PCR Screening     Status: None   Collection Time: 07/13/16 10:11 PM  Result Value Ref Range Status   MRSA by PCR NEGATIVE NEGATIVE Final    Comment:        The GeneXpert MRSA Assay (FDA approved for NASAL specimens only), is one component of a comprehensive MRSA colonization surveillance program. It is not intended to diagnose MRSA infection nor to guide or monitor treatment for MRSA infections.        Today   Subjective:   [name] today ,stable  for discharge to SNF>  Objective:   Blood pressure (!) 73/49, pulse 97, temperature 98.1 F (36.7 C), temperature source Oral, resp. rate 18, height 6' (1.829 m), weight 105.2 kg (231 lb 14.8 oz), SpO2 99 %.   Intake/Output Summary (Last 24 hours) at 07/20/16 1425 Last data filed at 07/20/16 1345  Gross per 24 hour  Intake              240 ml  Output             5001 ml  Net            -4761 ml    Exam Awake Alert, Oriented x 3, No new F.N deficits, Normal affect [place].AT,PERRAL Supple Neck,No JVD, No cervical lymphadenopathy appriciated.  Symmetrical Chest wall movement, Good air movement bilaterally, CTAB RRR,No Gallops,Rubs or new Murmurs, No Parasternal Heave +ve B.Sounds, Abd Soft, Non tender, No organomegaly appriciated, No rebound -guarding or rigidity. I R excellent edemaBilateral LE edema Data Review   CBC w Diff:  Lab Results  Component Value Date   WBC 6.1 07/20/2016   HGB 11.1 (L) 07/20/2016   HCT 34.0 (L) 07/20/2016    PLT 154 07/20/2016   LYMPHOPCT 9 05/22/2016   MONOPCT 24 05/22/2016   EOSPCT 1 05/22/2016   BASOPCT 1 05/22/2016    CMP:  Lab Results  Component Value Date   NA 132 (L) 07/20/2016   K 4.4 07/20/2016   CL 93 (L) 07/20/2016   CO2 31 07/20/2016   BUN 28 (H) 07/20/2016   CREATININE 3.88 (H) 07/20/2016   PROT 9.2 (H) 07/13/2016   ALBUMIN 2.7 (L) 07/20/2016   BILITOT 3.2 (H) 07/13/2016   ALKPHOS 96 07/13/2016   AST 41 07/13/2016   ALT 17 07/13/2016  .   Total Time in preparing paper work, data evaluation and todays exam - 35 minutes  [name] M.D on 07/20/2016 at 2:25 PM    Note: This  dictation was prepared with Dragon dictation along with smaller phrase technology. Any transcriptional errors that result from this process are unintentional.

## 2016-07-21 LAB — GLUCOSE, CAPILLARY
GLUCOSE-CAPILLARY: 143 mg/dL — AB (ref 65–99)
GLUCOSE-CAPILLARY: 154 mg/dL — AB (ref 65–99)
GLUCOSE-CAPILLARY: 157 mg/dL — AB (ref 65–99)

## 2016-07-21 MED ORDER — LORAZEPAM 2 MG PO TABS
2.0000 mg | ORAL_TABLET | Freq: Four times a day (QID) | ORAL | Status: DC | PRN
  Administered 2016-07-21 – 2016-07-26 (×6): 2 mg via ORAL
  Filled 2016-07-21 (×6): qty 1

## 2016-07-21 MED ORDER — ALBUMIN HUMAN 25 % IV SOLN
12.5000 g | Freq: Once | INTRAVENOUS | Status: DC
  Filled 2016-07-21: qty 50

## 2016-07-21 MED ORDER — HYDROXYZINE HCL 10 MG PO TABS
10.0000 mg | ORAL_TABLET | Freq: Once | ORAL | Status: AC
  Administered 2016-07-22: 10 mg via ORAL
  Filled 2016-07-21: qty 1

## 2016-07-21 NOTE — Progress Notes (Signed)
Post hd assessment 

## 2016-07-21 NOTE — Progress Notes (Signed)
Pre hd assessment  

## 2016-07-21 NOTE — Progress Notes (Signed)
  End of hd 

## 2016-07-21 NOTE — Progress Notes (Signed)
Discharge canceled today because of hypotension, patient has chronic hypotension and fluctuating blood pressures between 90/60/70/50. But the nursing home staff refused to take them because of chronic hypotension.

## 2016-07-21 NOTE — Progress Notes (Deleted)
[facility] [place] [facility]   PATIENT NAME: [name]    MR#:  [id number]  DATE OF BIRTH:  February 10, 1951  Discharge canceled yesterday because of hypotension. Lethargic this morning.   Came with increasing SOB and edmea-generalized  REVIEW OF SYSTEMS:   Review of Systems  Constitutional: Negative for chills, fever and weight loss.  HENT: Negative for ear discharge, ear pain and nosebleeds.   Eyes: Negative for blurred vision, pain and discharge.  Respiratory: Positive for shortness of breath. Negative for sputum production, wheezing and stridor.   Cardiovascular: Positive for orthopnea, leg swelling and PND. Negative for chest pain and palpitations.  Gastrointestinal: Negative for abdominal pain, diarrhea, nausea and vomiting.  Genitourinary: Negative for frequency and urgency.  Musculoskeletal: Negative for back pain and joint pain.  Neurological: Positive for weakness. Negative for sensory change, speech change and focal weakness.  Psychiatric/Behavioral: Negative for depression and hallucinations. The patient is not nervous/anxious.    Tolerating Diet:yes Tolerating PT: pending  DRUG ALLERGIES:  No Known Allergies  VITALS:  Blood pressure (!) 79/57, pulse 90, temperature 97.9 F (36.6 C), temperature source Oral, resp. rate 18, height 6' (1.829 m), weight 106.2 kg (234 lb 3.2 oz), SpO2 97 %.  PHYSICAL EXAMINATION:   Physical Exam  GENERAL:  65 y.o.-year-old patient lying in the bed with no acute distress. Chronically ill, Anasarca EYES: Pupils equal, round, reactive to light and accommodation. No scleral icterus. Extraocular muscles intact.  HEENT: Head atraumatic, normocephalic. Oropharynx and nasopharynx clear.  NECK:  Supple, no jugular venous distention. No thyroid enlargement, no tenderness.  LUNGS: decreased breath sounds bilaterally, no wheezing, rales, rhonchi. No use of accessory muscles of respiration.  CARDIOVASCULAR: S1, S2  normal. No murmurs, rubs, or gallops.  ABDOMEN: Soft, nontender, nondistended. Bowel sounds present. No organomegaly or mass.  EXTREMITIES: No cyanosis, clubbing  ++++ edema b/l.    NEUROLOGIC: Cranial nerves II through XII are intact. No focal Motor or sensory deficits b/l.   PSYCHIATRIC:  patient is alert and oriented x 3.  SKIN: No obvious rash, lesion, or ulcer.   LABORATORY PANEL:  CBC  Recent Labs Lab 07/20/16 0332  WBC 6.1  HGB 11.1*  HCT 34.0*  PLT 154    Chemistries   Recent Labs Lab 07/14/16 1500  07/20/16 0332  NA  --   < > 132*  K  --   < > 4.4  CL  --   < > 93*  CO2  --   < > 31  GLUCOSE  --   < > 152*  BUN  --   < > 28*  CREATININE  --   < > 3.88*  CALCIUM  --   < > 8.8*  MG 2.2  --   --   < > = values in this interval not displayed. Cardiac Enzymes No results for input(s): TROPONINI in the last 168 hours. RADIOLOGY:  No results found. ASSESSMENT AND PLAN:   [name]  is a 65 y.o. male with a known history of Chronic anemia, severe cardiomyopathy with ejection fraction less than 15%, chronic kidney disease and ESRD on hemodialysis, diabetes, there abuse, hyperlipidemia, hypertension- claims to be compliant with his dialysis in his medications. He started noticing generalized swelling and edema for last few days. Also feeling short of breath so came to emergency room.  * Acute on chronic systolic congestive heart failure   Acute hypoxic respiratory failure   Anasarca with end-stage renal disease.Getting  hemodialysis, total ultrafiltration since admission 19.9 KG    -  * Diabetes';; resolved hypoglycemia. *History of atrial flutter versus atrial tachycardia: Continue amiodarone, consider EP consultation, History of mural thrombus is not identified on recent echo, will start the warfarin, patient noncompliant  PT consult;waiting for rehabilitation placement  Patient did not go to skilled nursing yesterday because of hypotension. With  nephrology, patient has chronic hypotension and fluctuation in blood pressure from 90/70-79/57 is not uncommon for him. ,   * severe malnutrition on IV albumin for hypotension and poor volume status due to cardiomyopathy -dietitian to follwo  CM for d/c planning; [facility] when arrangements are made  Case discussed with Care Management/Social Worker. Management plans discussed with the patient, family and they are in agreement.  CODE STATUS:DNR\ Prognosis is extremely poor. Palliative care consult, hospice evaluation.  DVT Prophylaxis: heparin  TOTAL critical TIME TAKING CARE OF THIS PATIENT: 35 minutes.  >50% time spent on counselling and coordination of care  POSSIBLE D/C IN 2-3 DAYS, DEPENDING ON CLINICAL CONDITION.  Note: This dictation was prepared with Dragon dictation along with smaller phrase technology. Any transcriptional errors that result from this process are unintentional.  Marilynn Ekstein M.D on 07/21/2016 at 11:51 AM  Between 7am to 6pm - Pager - [phone]  After 6pm go to [email or web address] - password [email or web address] [facility]  [facility]  Office  [phone]  CC: Primary care physician; Dimple CaseySean A Smith, MD

## 2016-07-21 NOTE — Progress Notes (Signed)
PT Cancellation Note  Patient Details Name: [name] MRN: [id number] DOB: Nov 25, 1950   Cancelled Treatment:    Reason Eval/Treat Not Completed: Other (comment). Treatment attempted; pt with staff for personal care. Re attempt tomorrow.    Scot Dock, PTA 07/21/2016, 3:26 PM

## 2016-07-21 NOTE — Progress Notes (Signed)
Pre hd info 

## 2016-07-21 NOTE — Progress Notes (Signed)
Post hd vitals 

## 2016-07-21 NOTE — Progress Notes (Signed)
Pt refuses to remain on telemetry. RN and NT have replaced leads multiple times. HO notified. Verbal orders to dc telemetry.

## 2016-07-21 NOTE — Progress Notes (Signed)
[facility]  ROUNDING NOTE   Subjective:   Patient Is scheduled for dialysis later today primarily for volume removal Currently on room air and denies acute shortness of breath No nausea or vomiting   Objective:  Vital signs in last 24 hours:  Temp:  [97.9 F (36.6 C)] 97.9 F (36.6 C) (12/12 0603) Pulse Rate:  [89-93] 89 (12/12 1230) Resp:  [18-20] 18 (12/12 0603) BP: (74-89)/(52-63) 86/58 (12/12 1230) SpO2:  [93 %-100 %] 97 % (12/12 1029) Weight:  [106.2 kg (234 lb 3.2 oz)] 106.2 kg (234 lb 3.2 oz) (12/12 0603)  Weight change: 0 kg (0 lb) Filed Weights   07/20/16 0931 07/20/16 1345 07/21/16 0603  Weight: 110.2 kg (243 lb) 105.2 kg (231 lb 14.8 oz) 106.2 kg (234 lb 3.2 oz)    Intake/Output: I/O last 3 completed shifts: In: 340 [P.O.:340] Out: 5001 [Other:5000; Stool:1]   Intake/Output this shift:  No intake/output data recorded.  Physical Exam: General: NAD, sitting up in bed  Head: Normocephalic, atraumatic. Moist oral mucosal membranes  Eyes: Anicteric,    Neck: Supple, trachea midline  Lungs:  Mild basilar crackles, room air   Heart: irregular rhythm  Abdomen:  Soft, nontender, +distended  Extremities:  ++ Generalized edema.  Neurologic: Nonfocal, moving all four extremities  Skin: No lesions  Access: Right arm AVF    Basic Metabolic Panel:  Recent Labs Lab 07/16/16 0454 07/17/16 0428 07/18/16 0457 07/19/16 0449 07/20/16 0332  NA 136  135 131* 131* 133* 132*  K 4.2  4.2 3.9 4.2 3.9 4.4  CL 96*  96* 92* 93* 95* 93*  CO2 28  32 31 29 32 31  GLUCOSE 55*  57* 120* 237* 165* 152*  BUN 16  15 16 17 20  28*  CREATININE 3.11*  3.23* 2.88* 3.07* 3.01* 3.88*  CALCIUM 8.7*  8.5* 8.6* 8.4* 8.7* 8.8*  PHOS 2.3*  --   --   --  2.0*    Liver Function Tests:  Recent Labs Lab 07/16/16 0454 07/20/16 0332  ALBUMIN 2.5* 2.7*   No results for input(s): LIPASE, AMYLASE in the last 168 hours. No results for input(s): AMMONIA in the last  168 hours.  CBC:  Recent Labs Lab 07/16/16 0454 07/20/16 0332  WBC 6.4 6.1  HGB 11.2* 11.1*  HCT 35.6* 34.0*  MCV 90.9 88.9  PLT 136* 154    Cardiac Enzymes: No results for input(s): CKTOTAL, CKMB, CKMBINDEX, TROPONINI in the last 168 hours.  BNP: Invalid input(s): POCBNP  CBG:  Recent Labs Lab 07/20/16 0758 07/20/16 1421 07/20/16 1657 07/20/16 2131 07/21/16 1226  GLUCAP 153* 102* 105* 120* 143*    Microbiology: Results for orders placed or performed during the hospital encounter of 07/13/16  MRSA PCR Screening     Status: None   Collection Time: 07/13/16 10:11 PM  Result Value Ref Range Status   MRSA by PCR NEGATIVE NEGATIVE Final    Comment:        The GeneXpert MRSA Assay (FDA approved for NASAL specimens only), is one component of a comprehensive MRSA colonization surveillance program. It is not intended to diagnose MRSA infection nor to guide or monitor treatment for MRSA infections.     Coagulation Studies: No results for input(s): LABPROT, INR in the last 72 hours.  Urinalysis: No results for input(s): COLORURINE, LABSPEC, PHURINE, GLUCOSEU, HGBUR, BILIRUBINUR, KETONESUR, PROTEINUR, UROBILINOGEN, NITRITE, LEUKOCYTESUR in the last 72 hours.  Invalid input(s): APPERANCEUR    Imaging: No results found.  Medications:    . amiodarone  200 mg Oral Daily  . aspirin EC  81 mg Oral Daily  . atorvastatin  20 mg Oral QHS  . docusate  100 mg Oral Daily  . feeding supplement (ENSURE ENLIVE)  237 mL Oral TID BM  . heparin  5,000 Units Subcutaneous Q8H  . hydrocortisone cream  1 application Topical TID  . insulin aspart  0-9 Units Subcutaneous TID WC  . mouth rinse  15 mL Mouth Rinse BID  . sodium chloride flush  3 mL Intravenous Q12H   albuterol, guaiFENesin-codeine, lactulose, LORazepam  Assessment/ Plan:  [name] is a 65 y.o. black male with ESRD on hemodialysis, atrial fibrillation, mural thrombus, systolic congestive heart  failure, cocaine abuse  [facility]  1. ESRD with volume overload and acute pulmonary edema. Daily dialysis. UF since admission  Almost 22 litres - Next treatment Today for volume removal - IV albumin with treatment.   2. Systolic congestive heart failure: acute exacerbation - echocardiogram < 20 %  - appreciate cardiology input  3. Chronic hypotension:  due to congestive heart failure.   4. Anemia of chronic kidney disease: hemoglobin at goal, 11.1 - hold epo  5. Secondary Hyperparathyroidism: phos low at 2.0 - held sevelamer  6. Hyponatremia: from volume overload.   LOS: 8 Mansi Tokar 12/12/20174:39 PM

## 2016-07-21 NOTE — Clinical Social Work Note (Signed)
MSW was informed that patient's discharge was cancelled on Monday due to patient's blood pressure being to low.  MSW to continue to follow patient's progress and updated [facility].  Ervin Knack. Lynlee Stratton, MSW [phone]  Mon-Fri 8a-4:30p 07/21/2016 5:03 PM

## 2016-07-22 DIAGNOSIS — Z515 Encounter for palliative care: Secondary | ICD-10-CM

## 2016-07-22 DIAGNOSIS — Z7189 Other specified counseling: Secondary | ICD-10-CM

## 2016-07-22 LAB — CBC
HCT: 36.7 % — ABNORMAL LOW (ref 40.0–52.0)
Hemoglobin: 11.8 g/dL — ABNORMAL LOW (ref 13.0–18.0)
MCH: 28.5 pg (ref 26.0–34.0)
MCHC: 32.2 g/dL (ref 32.0–36.0)
MCV: 88.6 fL (ref 80.0–100.0)
PLATELETS: 188 10*3/uL (ref 150–440)
RBC: 4.14 MIL/uL — ABNORMAL LOW (ref 4.40–5.90)
RDW: 17.6 % — ABNORMAL HIGH (ref 11.5–14.5)
WBC: 6.8 10*3/uL (ref 3.8–10.6)

## 2016-07-22 LAB — GLUCOSE, CAPILLARY
GLUCOSE-CAPILLARY: 155 mg/dL — AB (ref 65–99)
Glucose-Capillary: 171 mg/dL — ABNORMAL HIGH (ref 65–99)
Glucose-Capillary: 142 mg/dL — ABNORMAL HIGH (ref 65–99)
Glucose-Capillary: 133 mg/dL — ABNORMAL HIGH (ref 65–99)

## 2016-07-22 LAB — PROTIME-INR
INR: 1.5
Prothrombin Time: 18.3 seconds — ABNORMAL HIGH (ref 11.4–15.2)

## 2016-07-22 MED ORDER — ACETAMINOPHEN 325 MG PO TABS
650.0000 mg | ORAL_TABLET | Freq: Four times a day (QID) | ORAL | Status: DC | PRN
  Administered 2016-07-22 – 2016-07-27 (×7): 650 mg via ORAL
  Filled 2016-07-22 (×7): qty 2

## 2016-07-22 MED ORDER — WARFARIN - PHARMACIST DOSING INPATIENT
Freq: Every day | Status: DC
  Administered 2016-07-23 – 2016-07-29 (×4)

## 2016-07-22 MED ORDER — MIDODRINE HCL 5 MG PO TABS
5.0000 mg | ORAL_TABLET | Freq: Three times a day (TID) | ORAL | Status: DC
  Administered 2016-07-22 – 2016-07-23 (×3): 5 mg via ORAL
  Filled 2016-07-22 (×3): qty 1

## 2016-07-22 MED ORDER — WARFARIN SODIUM 1 MG PO TABS
3.0000 mg | ORAL_TABLET | Freq: Once | ORAL | Status: AC
  Administered 2016-07-22: 3 mg via ORAL
  Filled 2016-07-22: qty 3

## 2016-07-22 NOTE — Progress Notes (Addendum)
ANTICOAGULATION CONSULT NOTE - Initial Consult  Pharmacy Consult for warfarin Indication: atrial fibrillation and hx of mural thrombus  No Known Allergies  Patient Measurements: Height: 6' (182.9 cm) Weight: 234 lb 9.6 oz (106.4 kg) IBW/kg (Calculated) : 77.6   Vital Signs: Temp: 97.8 F (36.6 C) (12/13 1113) Temp Source: Oral (12/13 1113) BP: 88/56 (12/13 1113) Pulse Rate: 92 (12/13 1113)  Labs:  Recent Labs  07/20/16 0332  HGB 11.1*  HCT 34.0*  PLT 154  CREATININE 3.88*    Estimated Creatinine Clearance: 23.3 mL/min (by C-G formula based on SCr of 3.88 mg/dL (H)).   Medical History: Past Medical History:  Diagnosis Date  . Anemia of chronic disease   . Apical mural thrombus   . Arthritis   . Chronic systolic heart failure (HCC)    a) ECHO [facility] 02/2014): EF <15%, multiple apical thrombi, RV mod enlarged, mod MR b. RHC (02/21/14) at [facility]: RA 16, RV 64/9 (15), PA 64/32 (42), PCWP: 24, CI: 1.4  . Diabetes mellitus with complication (HCC)   . Drug use    cocaine  . ESRD on hemodialysis (HCC)    a. MWF  . H/O noncompliance with medical treatment, presenting hazards to health   . HLD (hyperlipidemia)   . HTN (hypertension)   . Intrahepatic cholestasis 02/20/2016  . Mitral regurgitation   . PAF (paroxysmal atrial fibrillation) (HCC)   . Pneumonia       Assessment: 65 yo male restarting warfarin for hx of mural thrombus and PAF. Past records show warfarin dose of 2 mg daily. Pt has been off of warfarin per notes due to compliance issues; planning to d/c to nursing home.    Goal of Therapy:  INR 2-3 Monitor platelets by anticoagulation protocol: Yes   Plan:  Per discussion with MD, will start warfarin 3 mg tonight pending INR result. Pt also on amiodarone. INR ordered daily. Hgb this admission has been stable. CBC ordered.   INR 1.5, will proceed with warfarin 3mg  -MLS  Crist Fat L 07/22/2016,4:37 PM

## 2016-07-22 NOTE — Progress Notes (Signed)
[facility] - [place] at [facility]   PATIENT NAME: [name] MaceKenneth Banton    MR#:  [id number]  DATE OF BIRTH:  November 26, 1950 Patient is seen at bedside today, he was asking for pain medicine and he was yelling saying his back is hurting but when I went to see him he refused any pain, mostly sleeping all the time. Unable to tell me if he has any pain or any other complaints. Patient is lethargic, asking for pain medicine but when I went to see him he is sound asleep and the denying any pain.  REVIEW OF SYSTEMS:   Review of Systems  Constitutional: Negative for chills, fever and weight loss.  HENT: Negative for ear discharge, ear pain and nosebleeds.   Eyes: Negative for blurred vision, pain and discharge.  Respiratory: Positive for shortness of breath. Negative for sputum production, wheezing and stridor.   Cardiovascular: Positive for orthopnea, leg swelling and PND. Negative for chest pain and palpitations.  Gastrointestinal: Negative for abdominal pain, diarrhea, nausea and vomiting.  Genitourinary: Negative for frequency and urgency.  Musculoskeletal: Negative for back pain and joint pain.  Neurological: Positive for weakness. Negative for sensory change, speech change and focal weakness.  Psychiatric/Behavioral: Negative for depression and hallucinations. The patient is not nervous/anxious.    Tolerating Diet:yes Tolerating PT: pending  DRUG ALLERGIES:  No Known Allergies  VITALS:  Blood pressure (!) 73/48, pulse 93, temperature 98.3 F (36.8 C), temperature source Oral, resp. rate 18, height 6' (1.829 m), weight 106.4 kg (234 lb 9.6 oz), SpO2 96 %.  PHYSICAL EXAMINATION:   Physical Exam  GENERAL:  65 y.o.-year-old patient lying in the bed with no acute distress. Chronically ill, Anasarca, Mostly sleeping. EYES: Pupils equal, round, reactive to light and accommodation. No scleral icterus. Extraocular muscles intact.  HEENT: Head atraumatic, normocephalic.  Oropharynx and nasopharynx clear.  NECK:  Supple, no jugular venous distention. No thyroid enlargement, no tenderness.  LUNGS: decreased breath sounds bilaterally, no wheezing, rales, rhonchi. No use of accessory muscles of respiration.  CARDIOVASCULAR: S1, S2 normal. No murmurs, rubs, or gallops.  ABDOMEN: Soft, nontender, nondistended. Bowel sounds present. No organomegaly or mass.  EXTREMITIES: No cyanosis, clubbing  ++++ edema b/l.    NEUROLOGIC: Cranial nerves II through XII are intact. No focal Motor or sensory deficits b/l.   PSYCHIATRIC:  patient is alert and oriented x 3.  SKIN: No obvious rash, lesion, or ulcer.   LABORATORY PANEL:  CBC  Recent Labs Lab 07/20/16 0332  WBC 6.1  HGB 11.1*  HCT 34.0*  PLT 154    Chemistries   Recent Labs Lab 07/20/16 0332  NA 132*  K 4.4  CL 93*  CO2 31  GLUCOSE 152*  BUN 28*  CREATININE 3.88*  CALCIUM 8.8*   Cardiac Enzymes No results for input(s): TROPONINI in the last 168 hours. RADIOLOGY:  No results found. ASSESSMENT AND PLAN:   [name] MaceKenneth Wanner  is a 65 y.o. male with a known history of Chronic anemia, severe cardiomyopathy with ejection fraction less than 15%, chronic kidney disease and ESRD on hemodialysis, diabetes, there abuse, hyperlipidemia, hypertension- claims to be compliant with his dialysis in his medications. He started noticing generalized swelling and edema for last few days. Also feeling short of breath so came to emergency room.  * Acute on chronic systolic congestive heart failure   Acute hypoxic respiratory failure   Anasarca with end-stage renal disease.Getting hemodialysis, total ultrafiltration since admission 22 litres. Because of poor quality  of life with ESRD, severe systolic heart failure, diabetes, atrial flutter, chronic hypertension, debility requested palliative care to see the patient today. To see if he is qualified for hospice   -  * Diabetes';; resolved hypoglycemia.  sugar is acceptable to  150s.  *History of atrial flutter versus atrial tachycardia: Continue amiodarone, consider EP consultation, History of mural thrombus is not identified on recent echo, will start the warfarin, patient noncompliant  Physical Therapy recommended rehabilitation but the patient is not going to rehabilitation because of hypotension. Blood pressures staying around 70s over 40s.  Patient did not go to skilled nursing  because of hypotension. spokeWith nephrology, patient has chronic hypotension and fluctuation in blood pressure from 90/70-79/57 is not uncommon for him. Recommended palliative care, hospice evaluation ,   * severe malnutrition on IV albumin for hypotension and poor volume status due to cardiomyopathy -dietitian to follwo  CM for d/c planning; [facility] when arrangements are made  Case discussed with Care Management/Social Worker. Management plans discussed with the patient, family and they are in agreement.  CODE STATUS:DNR\ Prognosis is extremely poor. Palliative care consult, hospice evaluation.  DVT Prophylaxis: heparin  TOTAL critical TIME TAKING CARE OF THIS PATIENT: 35 minutes.  >50% time spent on counselling and coordination of care  POSSIBLE D/C IN 2-3 DAYS, DEPENDING ON CLINICAL CONDITION.  Note: This dictation was prepared with Dragon dictation along with smaller phrase technology. Any transcriptional errors that result from this process are unintentional.  Yaritzi Craun M.D on 07/22/2016 at 10:09 AM  Between 7am to 6pm - Pager - [phone]  After 6pm go to [email or web address] - password [email or web address] [facility]  [place] [facility]  Office  [phone]  CC: Primary care physician; Dimple Casey, MD

## 2016-07-22 NOTE — Care Management (Signed)
Informed by attending there is a palliative care consult pending to determine if patient qualifies for hospice home.  Notified CSW

## 2016-07-22 NOTE — Progress Notes (Signed)
PT Cancellation Note  Patient Details Name: Demetrius Brands MRN: [id number] DOB: [birth year]   Cancelled Treatment:    Reason Eval/Treat Not Completed: Patient declined, no reason specified;Other (comment). Treatment attempted; pt refuses. Palliative consult current for possible hospice home. Continue attempts if PT appropriate.    Scot Dock, PTA 07/22/2016, 12:55 PM

## 2016-07-22 NOTE — Progress Notes (Signed)
[facility]  ROUNDING NOTE   Subjective:   Currently on room air and denies acute shortness of breath No nausea or vomiting Resting quietly Ultrafiltration of 3000 cc yesterday with dialysis Blood pressure remains low   Objective:  Vital signs in last 24 hours:  Temp:  [97.3 F (36.3 C)-98.3 F (36.8 C)] 97.8 F (36.6 C) (12/13 1113) Pulse Rate:  [88-96] 92 (12/13 1113) Resp:  [16-28] 16 (12/13 1113) BP: (73-96)/(42-73) 88/56 (12/13 1113) SpO2:  [95 %-100 %] 99 % (12/13 1113) Weight:  [106.2 kg (234 lb 2.1 oz)-106.4 kg (234 lb 9.6 oz)] 106.4 kg (234 lb 9.6 oz) (12/13 0407)  Weight change: -4.024 kg (-8 lb 13.9 oz) Filed Weights   07/21/16 0603 07/21/16 1823 07/22/16 0407  Weight: 106.2 kg (234 lb 3.2 oz) 106.2 kg (234 lb 2.1 oz) 106.4 kg (234 lb 9.6 oz)    Intake/Output: I/O last 3 completed shifts: In: 300 [P.O.:300] Out: 3000 [Other:3000]   Intake/Output this shift:  No intake/output data recorded.  Physical Exam: General: NAD, sitting up in bed  Head: Normocephalic, atraumatic. Moist oral mucosal membranes  Eyes: Anicteric,    Neck: Supple, trachea midline  Lungs:  Mild basilar crackles, room air   Heart: irregular rhythm  Abdomen:  Soft, nontender, +distended  Extremities:  ++ Generalized edema.  Neurologic: Nonfocal, moving all four extremities  Skin: Dry skin with scratch marks  Access: Right arm AVF    Basic Metabolic Panel:  Recent Labs Lab 07/16/16 0454 07/17/16 0428 07/18/16 0457 07/19/16 0449 07/20/16 0332  NA 136  135 131* 131* 133* 132*  K 4.2  4.2 3.9 4.2 3.9 4.4  CL 96*  96* 92* 93* 95* 93*  CO2 28  32 31 29 32 31  GLUCOSE 55*  57* 120* 237* 165* 152*  BUN 16  15 16 17 20  28*  CREATININE 3.11*  3.23* 2.88* 3.07* 3.01* 3.88*  CALCIUM 8.7*  8.5* 8.6* 8.4* 8.7* 8.8*  PHOS 2.3*  --   --   --  2.0*    Liver Function Tests:  Recent Labs Lab 07/16/16 0454 07/20/16 0332  ALBUMIN 2.5* 2.7*   No results for  input(s): LIPASE, AMYLASE in the last 168 hours. No results for input(s): AMMONIA in the last 168 hours.  CBC:  Recent Labs Lab 07/16/16 0454 07/20/16 0332  WBC 6.4 6.1  HGB 11.2* 11.1*  HCT 35.6* 34.0*  MCV 90.9 88.9  PLT 136* 154    Cardiac Enzymes: No results for input(s): CKTOTAL, CKMB, CKMBINDEX, TROPONINI in the last 168 hours.  BNP: Invalid input(s): POCBNP  CBG:  Recent Labs Lab 07/21/16 1226 07/21/16 1659 07/21/16 2220 07/22/16 0717 07/22/16 1114  GLUCAP 143* 154* 157* 171* 142*    Microbiology: Results for orders placed or performed during the hospital encounter of 07/13/16  MRSA PCR Screening     Status: None   Collection Time: 07/13/16 10:11 PM  Result Value Ref Range Status   MRSA by PCR NEGATIVE NEGATIVE Final    Comment:        The GeneXpert MRSA Assay (FDA approved for NASAL specimens only), is one component of a comprehensive MRSA colonization surveillance program. It is not intended to diagnose MRSA infection nor to guide or monitor treatment for MRSA infections.     Coagulation Studies: No results for input(s): LABPROT, INR in the last 72 hours.  Urinalysis: No results for input(s): COLORURINE, LABSPEC, PHURINE, GLUCOSEU, HGBUR, BILIRUBINUR, KETONESUR, PROTEINUR, UROBILINOGEN, NITRITE, LEUKOCYTESUR in  the last 72 hours.  Invalid input(s): APPERANCEUR    Imaging: No results found.   Medications:    . albumin human  12.5 g Intravenous Once in dialysis  . amiodarone  200 mg Oral Daily  . aspirin EC  81 mg Oral Daily  . atorvastatin  20 mg Oral QHS  . docusate  100 mg Oral Daily  . feeding supplement (ENSURE ENLIVE)  237 mL Oral TID BM  . heparin  5,000 Units Subcutaneous Q8H  . hydrocortisone cream  1 application Topical TID  . insulin aspart  0-9 Units Subcutaneous TID WC  . mouth rinse  15 mL Mouth Rinse BID  . midodrine  5 mg Oral TID WC  . sodium chloride flush  3 mL Intravenous Q12H   acetaminophen, albuterol,  guaiFENesin-codeine, lactulose, LORazepam  Assessment/ Plan:  [name] with ESRD on hemodialysis, atrial fibrillation, mural thrombus, systolic congestive heart failure, cocaine abuse  [facility]  1. ESRD with volume overload and acute pulmonary edema. Daily dialysis. UF since admission  Almost 24.5 litres - Next treatment possibly tomorrow for volume removal - IV albumin with treatment.   2. Systolic congestive heart failure: acute exacerbation - echocardiogram < 20 %   3. Chronic hypotension:  due to congestive heart failure.  Start Midodrine  4. Anemia of chronic kidney disease: hemoglobin at goal, 11.1 - hold epo  5. Secondary Hyperparathyroidism: phos low at 2.0 - held sevelamer  6. Hyponatremia: from volume overload.   LOS: 9 [name] 12/13/20172:41 PM

## 2016-07-22 NOTE — Consult Note (Signed)
Consultation Note Date: 07/22/16  Patient Name: [name]  DOB: 02-10-1951  MRN: [id number]  Age / Sex: 65 y.o., male  PCP: Dimple Casey, MD Referring Physician: Ramonita Lab, MD  Reason for Consultation: Establishing goals of care  HPI/Patient Profile: 65 y.o. male  with past medical history of ESRD on HD, cardiomyopathy, systolic congestive heart failure with EF <20%, diabetes, anemia of chronic disease, hypertension, hyperlipidemia, mitral regurgitation, and drug abuse admitted on 07/13/2016 with generalized swelling and shortness of breath. In ED, he was hypoxic requiring 3-4L [place]. Anasarca due to end-stage renal disease and acute on chronic congestive heart failure. Patient also with chronic hypotension requiring IV albumin during dialysis and midodrine. Overall poor prognosis. Palliative medicine consultation for goals of care.    Clinical Assessment and Goals of Care: This NP received a call back from daughter, [name] who lives in [place]. She is an only child, but Darren and Sara Chu (niece and nephew) have been like siblings to her all her life. They live in [place]. [name] tells me none of them were aware Mr. Standre was in the hospital again. She tries to talk with him every week but he often lies to her about his declining health and medical conditions. The patient was living with [name]'s mother for a long time and she was helping manage his medical problems. Unfortunately, the patient has not lived with her in over a year and was living alone before hospital/rehab. [name] tells me he is a very stubborn man and never wants to admit that his health is declining, including increased confusion and agitation. She tells me he has recently been "hanging out with a bad crowd."   Discussed in detail his current medical status and co-morbidities that are contributing to the severity of his health. [name]  tells me he has been on dialysis since at least 2015. She is aware that he has a "weak heart." [name] was unaware of how sick her father is currently. Explained that the heart, lungs, and kidneys all work together and working very hard to compensate for each other. Discussed his worsening hypotension during dialysis and frequently requiring albumin. Just the fact that he is not tolerating dialysis like he once did. Explained my concern that he will continue to decline and require frequent hospitalizations and interventions that may be more harm then good to her father as his diseases (ESRD and CHF) continue to progress.  [name] asked "how much time does he have." Informed her of his poor prognosis and that once dialysis is discontinued, he would live days to two weeks. Explained that he would be eligible to go to a hospice home where they would focus on comfort and symptom management. [name] is shocked to hear this but understands her father has not been in good health for years. Encouraged her to talk with Ebony Hail and Sara Chu about our conversation and if possible, have them come to the hospital to talk with me in person about these big decisions. Also, encouraged her to call her dad and  evaluate his understanding of his condition, since he will not talk to me. Answered questions and offered support. [name] Spanishamila tells me she will call me tomorrow, 12/14 to further discuss plan of care and hospice.   SUMMARY OF RECOMMENDATIONS    DNR/DNI  Discussed medical condition, poor prognosis, and hospice with daughter. She would like more time to consider options and talk with family.   PMT will f/u tomorrow 12/14.  Code Status/Advance Care Planning:  DNR   Symptom Management:   Per attending  Palliative Prophylaxis:   Aspiration, Delirium Protocol, Oral Care and Turn Reposition  Psycho-social/Spiritual:   Desire for further Chaplaincy support:yes  Additional Recommendations: Caregiving   Support/Resources and Education on Hospice  Prognosis:   Unable to determine guarded-ESRD on HD with frequent hypotension. Also with CHF and severe cardiomyopathy with EF of <20%.   Discharge Planning: To Be Determined      Primary Diagnoses: Present on Admission: . Acute on chronic systolic congestive heart failure (HCC) . PAF (paroxysmal atrial fibrillation) (HCC) . Type 2 diabetes mellitus with diabetic nephropathy, without long-term current use of insulin (HCC) . Anemia of chronic disease . Protein-calorie malnutrition, severe (HCC) . Acute respiratory failure (HCC)   I have reviewed the medical record, interviewed the patient and family, and examined the patient. The following aspects are pertinent.  Past Medical History:  Diagnosis Date  . Anemia of chronic disease   . Apical mural thrombus   . Arthritis   . Chronic systolic heart failure (HCC)    a) [facility]): EF <15%, multiple apical thrombi, RV mod enlarged, mod MR b. [facility]: RA 16, RV 64/9 (15), PA 64/32 (42), PCWP: 24, CI: 1.4  . Diabetes mellitus with complication (HCC)   . Drug use    cocaine  . ESRD on hemodialysis (HCC)    a. MWF  . H/O noncompliance with medical treatment, presenting hazards to health   . HLD (hyperlipidemia)   . HTN (hypertension)   . Intrahepatic cholestasis 02/20/2016  . Mitral regurgitation   . PAF (paroxysmal atrial fibrillation) (HCC)   . Pneumonia    Social History   Social History  . Marital status: Divorced    Spouse name: N/A  . Number of children: N/A  . Years of education: N/A   Social History Main Topics  . Smoking status: Never Smoker  . Smokeless tobacco: Never Used  . Alcohol use No     Comment: last usuage 1 yr. ago  . Drug use:     Types: Cocaine     Comment: hx cocaine use  as of 1 yr. ago  . Sexual activity: Not Asked   Other Topics Concern  . None   Social History Narrative   Respiratory therapist   Family History  Problem  Relation Age of Onset  . Hypertension Mother    Scheduled Meds: . albumin human  12.5 g Intravenous Once in dialysis  . amiodarone  200 mg Oral Daily  . aspirin EC  81 mg Oral Daily  . atorvastatin  20 mg Oral QHS  . docusate  100 mg Oral Daily  . feeding supplement (ENSURE ENLIVE)  237 mL Oral TID BM  . heparin  5,000 Units Subcutaneous Q8H  . hydrocortisone cream  1 application Topical TID  . insulin aspart  0-9 Units Subcutaneous TID WC  . mouth rinse  15 mL Mouth Rinse BID  . midodrine  5 mg Oral TID WC  . sodium chloride  flush  3 mL Intravenous Q12H  . Warfarin - Pharmacist Dosing Inpatient   Does not apply q1800   Continuous Infusions: PRN Meds:.acetaminophen, albuterol, guaiFENesin-codeine, lactulose, LORazepam Medications Prior to Admission:  Prior to Admission medications   Medication Sig Start Date End Date Taking? Authorizing Provider  acetaminophen (TYLENOL) 325 MG tablet Take 650 mg by mouth every 6 (six) hours as needed for mild pain or moderate pain.   Yes Historical Provider, MD  amiodarone (PACERONE) 200 MG tablet Take 1 tablet (200 mg total) by mouth daily. 08/23/14  Yes Amy D Clegg, NP  aspirin EC 81 MG EC tablet Take 1 tablet (81 mg total) by mouth daily. 05/05/16  Yes Costin Otelia Sergeant, MD  atorvastatin (LIPITOR) 20 MG tablet Take 20 mg by mouth at bedtime.   Yes Historical Provider, MD  guaiFENesin-codeine (ROBITUSSIN AC) 100-10 MG/5ML syrup Take 5 mLs by mouth every 6 (six) hours as needed for cough.   Yes Historical Provider, MD  hydrocortisone cream 1 % Apply 1 application topically 3 (three) times daily.   Yes Historical Provider, MD  insulin aspart (NOVOLOG) 100 UNIT/ML injection Inject 3 Units into the skin 3 (three) times daily before meals.   Yes Historical Provider, MD  insulin NPH Human (HUMULIN N,NOVOLIN N) 100 UNIT/ML injection Inject 0.12 mLs (12 Units total) into the skin at bedtime. 09/13/14  Yes Margarita Grizzle, MD  Melatonin 3 MG TABS Take 3 mg by mouth  at bedtime.   Yes Historical Provider, MD  sevelamer carbonate (RENVELA) 800 MG tablet Take 3 tablets (2,400 mg total) by mouth 3 (three) times daily with meals. 02/22/16  Yes Almon Hercules, MD  feeding supplement, ENSURE ENLIVE, (ENSURE ENLIVE) LIQD Take 237 mLs by mouth 2 (two) times daily between meals. 07/20/16   Katha Hamming, MD  insulin aspart (NOVOLOG) 100 UNIT/ML injection Inject 0-9 Units into the skin 3 (three) times daily with meals. 07/20/16   Katha Hamming, MD  warfarin (COUMADIN) 3 MG tablet Take 1 tablet (3 mg total) by mouth daily. 07/20/16   Katha Hamming, MD   No Known Allergies Review of Systems  Unable to perform ROS: Mental status change   Physical Exam  Constitutional: He appears ill.  Drowsy/lethargic  Cardiovascular: Regular rhythm.  Exam reveals distant heart sounds.   Pulmonary/Chest: Accessory muscle usage present. He has decreased breath sounds.  Abdominal: Soft. Bowel sounds are decreased.  Neurological: He is disoriented.  Skin: Skin is dry.  Psychiatric: His speech is delayed. He is agitated. Cognition and memory are impaired. He is inattentive.  Nursing note and vitals reviewed.  Vital Signs: BP (!) 117/95 (BP Location: Left Arm)   Pulse 84   Temp 97.5 F (36.4 C) (Oral)   Resp 18   Ht 6' (1.829 m)   Wt 105 kg (231 lb 6.4 oz)   SpO2 100%   BMI 31.38 kg/m  Pain Assessment: PAINAD POSS *See Group Information*: 2-Acceptable,Slightly drowsy, easily aroused Pain Score: Asleep  SpO2: SpO2: 100 % O2 Device:SpO2: 100 % O2 Flow Rate: .O2 Flow Rate (L/min): 3 L/min  IO: Intake/output summary:   Intake/Output Summary (Last 24 hours) at 07/23/16 0907 Last data filed at 07/23/16 8184  Gross per 24 hour  Intake                0 ml  Output                0 ml  Net  0 ml   LBM: Last BM Date: 07/19/16 Baseline Weight: Weight: 127 kg (280 lb) Most recent weight: Weight: 105 kg (231 lb 6.4 oz)     Palliative  Assessment/Data: PPS 30%   Flowsheet Rows   Flowsheet Row Most Recent Value  Intake Tab  Referral Department  Hospitalist  Unit at Time of Referral  Cardiac/Telemetry Unit  Palliative Care Primary Diagnosis  Cardiac  Date Notified  07/21/16  Palliative Care Type  New Palliative care  Reason for referral  Clarify Goals of Care  Date of Admission  07/13/16  # of days IP prior to Palliative referral  8  Clinical Assessment  Palliative Performance Scale Score  30%  Psychosocial & Spiritual Assessment  Palliative Care Outcomes  Patient/Family meeting held?  Yes  Who was at the meeting?  spoke with daughter [name]) via telephone (Lives in [place])  Palliative Care Outcomes  Clarified goals of care, Counseled regarding hospice, Provided end of life care assistance, Provided psychosocial or spiritual support      Time In/Out: 1100-1130, 1610-9604  Time Total: Greater than 50%  of this time was spent counseling and coordinating care related to the above assessment and plan.  Signed by:  Vennie Homans, FNP-C Palliative Medicine Team  Phone: [phone] Fax: [phone]   Please contact Palliative Medicine Team phone at [phone] for questions and concerns.  For individual provider: See Loretha Stapler

## 2016-07-22 NOTE — Progress Notes (Signed)
MD notified. Pt repeatedly yelling out he's in pain. Complaints of back pain. Tylenol order received. I will continue to assess.

## 2016-07-22 NOTE — Progress Notes (Signed)
This NP attempted to discuss goals of care with patient twice today. First attempt, patient was very drowsy and not participating in the conversation. I went back this afternoon, and patient was agitated, stating he wanted to get out of bed and needed help. I asked patient multiple questions regarding GOC and understanding of the severity of his declining health but unfortunately he will not answer questions and will not participate in the conversation.   I have left a voicemail for daughter, [name], who lives out of state. I spoke with nephew, [name], via telephone and expressed my concern of his Uncle's current health status and the need for family to meet with me to discuss goals of care. [name] was very distracted during the conversation stating he will call me back, which he unfortunately did not. I will attempt to contact family again tomorrow, 12/14 to discuss goals of care, poor prognosis, and disposition.   NO CHARGE  Vennie Homans, FNP-C Palliative Medicine Team  Phone: [phone] Fax: [phone]

## 2016-07-23 DIAGNOSIS — Z515 Encounter for palliative care: Secondary | ICD-10-CM

## 2016-07-23 DIAGNOSIS — Z7189 Other specified counseling: Secondary | ICD-10-CM

## 2016-07-23 LAB — CBC
HEMATOCRIT: 34.6 % — AB (ref 40.0–52.0)
Hemoglobin: 11.4 g/dL — ABNORMAL LOW (ref 13.0–18.0)
MCH: 28.7 pg (ref 26.0–34.0)
MCHC: 32.9 g/dL (ref 32.0–36.0)
MCV: 87.2 fL (ref 80.0–100.0)
Platelets: 189 10*3/uL (ref 150–440)
RBC: 3.97 MIL/uL — ABNORMAL LOW (ref 4.40–5.90)
RDW: 17.4 % — AB (ref 11.5–14.5)
WBC: 6 10*3/uL (ref 3.8–10.6)

## 2016-07-23 LAB — GLUCOSE, CAPILLARY
GLUCOSE-CAPILLARY: 120 mg/dL — AB (ref 65–99)
Glucose-Capillary: 145 mg/dL — ABNORMAL HIGH (ref 65–99)
Glucose-Capillary: 141 mg/dL — ABNORMAL HIGH (ref 65–99)
Glucose-Capillary: 137 mg/dL — ABNORMAL HIGH (ref 65–99)

## 2016-07-23 LAB — PROTIME-INR
INR: 1.43
Prothrombin Time: 17.6 seconds — ABNORMAL HIGH (ref 11.4–15.2)

## 2016-07-23 MED ORDER — ALBUMIN HUMAN 25 % IV SOLN
12.5000 g | Freq: Once | INTRAVENOUS | Status: AC
  Administered 2016-07-23: 12.5 g via INTRAVENOUS
  Filled 2016-07-23: qty 50

## 2016-07-23 MED ORDER — MIDODRINE HCL 5 MG PO TABS
10.0000 mg | ORAL_TABLET | Freq: Three times a day (TID) | ORAL | Status: DC
  Administered 2016-07-23 – 2016-07-30 (×21): 10 mg via ORAL
  Filled 2016-07-23 (×22): qty 2

## 2016-07-23 MED ORDER — WARFARIN SODIUM 1 MG PO TABS
3.0000 mg | ORAL_TABLET | Freq: Once | ORAL | Status: AC
  Administered 2016-07-23: 3 mg via ORAL
  Filled 2016-07-23: qty 3

## 2016-07-23 NOTE — Progress Notes (Signed)
Daily Progress Note   Patient Name: [name]       Date: 07/23/2016 DOB: [birth year]  Age: 65 y.o. MRN#: [id number] Attending Physician: [name] LabAruna Gouru, MD Primary Care Physician: [name] CaseySean A Smith, MD Admit Date: 07/13/2016  Reason for Consultation/Follow-up: Establishing goals of care  Subjective: Patient more alert today and willing to talk to me with [name], daughter on speaker phone from [place]. [name] tells us he "doesn't feel good" but cannot describe how exactly he feels. When I educate him on his ESRD, CHF, and overall declining health, he does seem to understand that he is very sick. He continuously says "I don't know what to do." [name] very tearful during the conversation and promises her dad she will come to [place] this weekend to talk things through with him and the family.   Answered multiple questions for [name] regarding ESRD, CHF, current medical interventions, and poor prognosis. Discussed the likelihood of this continuing to be a cycle of fluid overload and rehospitalization for him if he is stable for discharge back to facility. Discussed continuing an aggressive medical intervention pathway versus a comfort pathway, which would include discontinuation of dialysis and transition to hospice. She understands time will be short if this occurs. If able to discharge, I strongly encouraged her to continue conversations with her father and family regarding this big decision that will be faced in the future, since dialysis is a life-prolonging measure and a form of life support.   This evening, I was able to meet with [name] and [name] (neice and nephew) with [name] on the speaker phone. [name] has always seen the patient as a father figure because he helped raise her. In the  last two years of him being on dialysis, she has tried to help him stay compliant with treatment and fluid limitations but unfortunately had to kick him out of the house because he is "addicted to crack." She also speaks of how "stubborn and spoiled" he is and after he found out he needed to start dialysis, he bought a bunch of soda and drank himself sick. She understands his declining health but her biggest concern is telling his mother, who is in a [place] home [name] and [name] tell me that her world revolves around [name] and that she won't survive without  him. They tell me [name] would want to spend his last days with his mother at [name] if at all possible. Answered all questions and reassured my continued support with helping them navigate through these decisions.   Length of Stay: 10  Current Medications: Scheduled Meds:  . amiodarone  200 mg Oral Daily  . aspirin EC  81 mg Oral Daily  . atorvastatin  20 mg Oral QHS  . docusate  100 mg Oral Daily  . feeding supplement (ENSURE ENLIVE)  237 mL Oral TID BM  . heparin  5,000 Units Subcutaneous Q8H  . hydrocortisone cream  1 application Topical TID  . insulin aspart  0-9 Units Subcutaneous TID WC  . mouth rinse  15 mL Mouth Rinse BID  . midodrine  10 mg Oral TID WC  . sodium chloride flush  3 mL Intravenous Q12H  . warfarin  3 mg Oral ONCE-1800  . Warfarin - Pharmacist Dosing Inpatient   Does not apply q1800    Continuous Infusions:  PRN Meds: acetaminophen, albuterol, guaiFENesin-codeine, lactulose, LORazepam  Physical Exam  Constitutional: He appears ill.  Cardiovascular: An irregularly irregular rhythm present. Exam reveals distant heart sounds.   Pulmonary/Chest: No accessory muscle usage. He has decreased breath sounds.  Abdominal: Soft. Bowel sounds are decreased.  Neurological: He is alert.  Oriented to person and place. Very forgetful/confused with intermittent agitation.     Skin: Skin is dry.  Psychiatric: He has a normal mood and affect. His speech is normal. He is agitated. Cognition and memory are impaired.  Nursing note and vitals reviewed.          Vital Signs: BP (!) 82/62 (BP Location: Left Arm)   Pulse 88   Temp 97 F (36.1 C) (Axillary)   Resp 20   Ht 6' (1.829 m)   Wt 103.2 kg (227 lb 8.2 oz)   SpO2 99%   BMI 30.86 kg/m  SpO2: SpO2: 99 % O2 Device: O2 Device: Nasal Cannula O2 Flow Rate: O2 Flow Rate (L/min): 3 L/min  Intake/output summary:   Intake/Output Summary (Last 24 hours) at 07/23/16 1640 Last data filed at 07/23/16 1443  Gross per 24 hour  Intake                0 ml  Output             4238 ml  Net            -4238 ml   LBM: Last BM Date: 07/19/16 Baseline Weight: Weight: 127 kg (280 lb) Most recent weight: Weight: 103.2 kg (227 lb 8.2 oz)       Palliative Assessment/Data: PPS 30%   Flowsheet Rows   Flowsheet Row Most Recent Value  Intake Tab  Referral Department  Hospitalist  Unit at Time of Referral  Cardiac/Telemetry Unit  Palliative Care Primary Diagnosis  Cardiac  Date Notified  07/21/16  Palliative Care Type  New Palliative care  Reason for referral  Clarify Goals of Care  Date of Admission  07/13/16  # of days IP prior to Palliative referral  8  Clinical Assessment  Palliative Performance Scale Score  30%  Psychosocial & Spiritual Assessment  Palliative Care Outcomes  Patient/Family meeting held?  Yes  Who was at the meeting?  patient and daughter via telephone  Palliative Care Outcomes  Clarified goals of care, Provided psychosocial or spiritual support, Counseled regarding hospice, Provided end of life care assistance      Patient  Active Problem List   Diagnosis Date Noted  . Palliative care encounter   . Goals of care, counseling/discussion   . Encounter for hospice care discussion   . Pressure injury of skin 07/14/2016  . NICM (nonischemic cardiomyopathy) (HCC) 07/14/2016  . H/O noncompliance  with medical treatment, presenting hazards to health 07/14/2016  . Anemia of chronic disease 07/14/2016  . Protein-calorie malnutrition, severe (HCC) 07/14/2016  . Malnutrition of moderate degree 07/14/2016  . Liver dysfunction   . Noncompliance with renal dialysis (HCC)   . ESRD (end stage renal disease) on dialysis (HCC) 05/03/2016  . Generalized weakness 05/02/2016  . Intrahepatic cholestasis 02/20/2016  . Direct hyperbilirubinemia   . Paroxysmal atrial fibrillation (HCC)   . Shortness of breath   . Type 2 diabetes mellitus with diabetic nephropathy, without long-term current use of insulin (HCC)   . End stage renal disease (HCC) 12/25/2014  . Atrial fibrillation [I48.91] 08/09/2014  . Encounter for therapeutic drug monitoring 08/09/2014  . Chronic systolic heart failure (HCC) 07/04/2014  . ESRD (end stage renal disease) (HCC) 07/04/2014  . PAF (paroxysmal atrial fibrillation) (HCC) 07/04/2014  . Controlled diabetes mellitus type II without complication (HCC) 06/22/2014  . Gout 06/22/2014  . ESRD needing dialysis (HCC)   . Pulmonary edema   . Bacteremia associated with intravascular line (HCC)   . Blood poisoning (HCC)   . Acute respiratory failure with hypoxia (HCC)   . AKI (acute kidney injury) (HCC)   . Screen for STD (sexually transmitted disease)   . Acute respiratory failure (HCC) 06/08/2014  . Cardiogenic shock (HCC) 06/08/2014  . Acute on chronic systolic congestive heart failure (HCC) 06/08/2014  . Acute on chronic renal failure (HCC) 06/08/2014  . Hyperkalemia 06/08/2014  . Respiratory failure (HCC) 06/08/2014    Palliative Care Assessment & Plan   Patient Profile: 65 y.o. male  with past medical history of ESRD on HD, cardiomyopathy, systolic congestive heart failure with EF <20%, diabetes, anemia of chronic disease, hypertension, hyperlipidemia, mitral regurgitation, and drug abuse admitted on 07/13/2016 with generalized swelling and shortness of breath. In ED, he  was hypoxic requiring 3-4L . Anasarca due to end-stage renal disease and acute on chronic congestive heart failure. Patient also with chronic hypotension requiring IV albumin during dialysis and midodrine. Overall poor prognosis. Palliative medicine consultation for goals of care.    Assessment: Acute on chronic systolic congestive heart failure Acute hypoxic respiratory failure Anasarca End-stage renal disease on dialysis Diabetes Atrial flutter Chronic hypotension  Recommendations/Plan:  PMT will continue to discuss goals of care with family and follow-up tomorrow, 12/15.   Goals of Care and Additional Recommendations:  Limitations on Scope of Treatment: Full Scope Treatment-except DNR/DNI  Code Status: DNR   Code Status Orders        Start     Ordered   07/13/16 2203  Do not attempt resuscitation (DNR)  Continuous    Question Answer Comment  In the event of cardiac or respiratory ARREST Do not call a "code blue"   In the event of cardiac or respiratory ARREST Do not perform Intubation, CPR, defibrillation or ACLS   In the event of cardiac or respiratory ARREST Use medication by any route, position, wound care, and other measures to relive pain and suffering. May use oxygen, suction and manual treatment of airway obstruction as needed for comfort.      07/13/16 2202    Code Status History    Date Active Date Inactive Code Status Order ID  Comments User Context   05/22/2016  2:49 AM 05/23/2016  7:07 PM Full Code [id number]  Gwendolyn Fill, NP ED   05/03/2016 12:21 AM 05/06/2016  9:49 PM Full Code [id number]  Michael Litter, MD Inpatient   02/17/2016  7:38 PM 02/22/2016 11:44 PM DNR [id number]  Renne Musca, MD Inpatient   06/22/2014  2:36 PM 06/28/2014  8:24 PM Full Code [id number]  Berdine Dance, MD Inpatient   06/08/2014 12:10 PM 06/22/2014  2:36 PM Full Code [id number]  Alyson Reedy, MD ED       Prognosis:   Unable to determine  Discharge Planning:  To Be  Determined  Care plan was discussed with patient, daughter, RN, Dr. Thedore Mins, and Dr. Amado Coe  Thank you for allowing the Palliative Medicine Team to assist in the care of this patient.   Time In: 1330, 1715 Time Out: 1430, 1745 Total Time Prolonged Time Billed  yes      Greater than 50%  of this time was spent counseling and coordinating care related to the above assessment and plan.  Vennie Homans, FNP-C Palliative Medicine Team  Phone: [phone] Fax: [phone]  Please contact Palliative Medicine Team phone at [phone] for questions and concerns.

## 2016-07-23 NOTE — Progress Notes (Signed)
HD initiated without issue. Pt resting

## 2016-07-23 NOTE — Progress Notes (Signed)
Hemodialysis- BP dropping into 60s-70s/50s. Dr. Thedore Mins at bedside. Order received for albumin. Given. Patient remains asymptomatic.

## 2016-07-23 NOTE — Progress Notes (Signed)
Patient's SBP has been 84/51 and 82/58 post dialysis. Patient had 4.3L removed and received albumin x2 during treatment. Patient is asymptomatic and resting.  Patient did receive his increased dose of Midodrine po.  Notified Dr. Elisabeth Pigeon. No new orders received. Continue monitoring.

## 2016-07-23 NOTE — Progress Notes (Signed)
[facility] [place] [facility]   PATIENT NAME: [name]    MR#:  [id number]  DATE OF BIRTH:  November 11, 1950   Patient is seen in HD unit today. Doesn't know what his nml bp is. Denies any complaints, sleepy Patient is lethargic, asking for pain medicine but when I went to see him he is sound asleep and the denying any pain.  REVIEW OF SYSTEMS:   Review of Systems  Constitutional: Negative for chills, fever and weight loss.  HENT: Negative for ear discharge, ear pain and nosebleeds.   Eyes: Negative for blurred vision, pain and discharge.  Respiratory: Positive for shortness of breath. Negative for sputum production, wheezing and stridor.   Cardiovascular: Positive for orthopnea, leg swelling and PND. Negative for chest pain and palpitations.  Gastrointestinal: Negative for abdominal pain, diarrhea, nausea and vomiting.  Genitourinary: Negative for frequency and urgency.  Musculoskeletal: Negative for back pain and joint pain.  Neurological: Positive for weakness. Negative for sensory change, speech change and focal weakness.  Psychiatric/Behavioral: Negative for depression and hallucinations. The patient is not nervous/anxious.    Tolerating Diet:yes Tolerating PT: pending  DRUG ALLERGIES:  No Known Allergies  VITALS:  Blood pressure (!) 83/57, pulse 88, temperature 97 F (36.1 C), temperature source Axillary, resp. rate 20, height 6' (1.829 m), weight 103.2 kg (227 lb 8.2 oz), SpO2 98 %.  PHYSICAL EXAMINATION:   Physical Exam  GENERAL:  65 y.o.-year-old patient lying in the bed with no acute distress. Chronically ill, Anasarca, Mostly sleeping. EYES: Pupils equal, round, reactive to light and accommodation. No scleral icterus. Extraocular muscles intact.  HEENT: Head atraumatic, normocephalic. Oropharynx and nasopharynx clear.  NECK:  Supple, no jugular venous distention. No thyroid enlargement, no tenderness.  LUNGS: decreased breath sounds  bilaterally, no wheezing, rales, rhonchi. No use of accessory muscles of respiration.  CARDIOVASCULAR: S1, S2 normal. No murmurs, rubs, or gallops.  ABDOMEN: Soft, nontender, nondistended. Bowel sounds present. No organomegaly or mass.  EXTREMITIES: No cyanosis, clubbing  ++++ edema b/l.    NEUROLOGIC: Cranial nerves II through XII are intact. No focal Motor or sensory deficits b/l.   PSYCHIATRIC:  patient is alert and oriented x 3.  SKIN: No obvious rash, lesion, or ulcer.   LABORATORY PANEL:  CBC  Recent Labs Lab 07/23/16 0656  WBC 6.0  HGB 11.4*  HCT 34.6*  PLT 189    Chemistries   Recent Labs Lab 07/20/16 0332  NA 132*  K 4.4  CL 93*  CO2 31  GLUCOSE 152*  BUN 28*  CREATININE 3.88*  CALCIUM 8.8*   Cardiac Enzymes No results for input(s): TROPONINI in the last 168 hours. RADIOLOGY:  No results found. ASSESSMENT AND PLAN:   [name]  is a 65 y.o. male with a known history of Chronic anemia, severe cardiomyopathy with ejection fraction less than 15%, chronic kidney disease and ESRD on hemodialysis, diabetes, there abuse, hyperlipidemia, hypertension- claims to be compliant with his dialysis in his medications. He started noticing generalized swelling and edema for last few days. Also feeling short of breath so came to emergency room.  * Acute hypoxic resp failure 2/2 acute  on chronic systolic congestive heart failure and Anasarca with end-stage renal disease. EF -20 % Getting hemodialysis today, total ultrafiltration since admission 24.5 litres.  * Persistent hypotension Midodrine 10 mg po tid     * FTT  Because of poor quality of life with ESRD, severe systolic heart failure, diabetes, atrial  flutter, chronic hypertension, debility palliative care consult placed Family is planning to have a meeting on Saturday      -  * Diabetes';; resolved hypoglycemia.  sugar is acceptable to 150s.  *History of atrial flutter versus atrial tachycardia: Continue  amiodarone, consider EP consultation, History of mural thrombus is not identified on recent echo,  Warfarin started,INR 1.43 patient noncompliant  ,* severe malnutrition on IV albumin for hypotension and poor volume status due to cardiomyopathy -dietitian to follow     Physical Therapy recommended rehabilitation but the patient is not going to rehabilitation because of persistent  hypotension. Blood pressures staying around 70s over 40s.   CM for d/c planning; [place] house when arrangements are made  Case discussed with Care Management/Social Worker. Management plans discussed with the patient, family and they are in agreement.  CODE STATUS:DNR\ Prognosis is extremely poor. Palliative care arranging family meeting/ hospice evaluation.  DVT Prophylaxis: heparin  TOTAL critical TIME TAKING CARE OF THIS PATIENT: 35 minutes.  >50% time spent on counselling and coordination of care  POSSIBLE D/C IN 2-3 DAYS, DEPENDING ON CLINICAL CONDITION.  Note: This dictation was prepared with Dragon dictation along with smaller phrase technology. Any transcriptional errors that result from this process are unintentional.  Ramonita LabGouru, Thedford Bunton M.D on 07/23/2016 at 6:58 PM  Between 7am to 6pm - Pager - [phone]  After 6pm go to [email or web address] - password [email or web address] [facility]  [facility]  Office  [phone]  CC: Primary care physician; Dimple CaseySean A Smith, MD

## 2016-07-23 NOTE — Progress Notes (Signed)
HD completed without issue. BP dropped during tx. Additional albumin given for fluid removal. Sequential for 1.5 hours, HD for 2 hours per order. Alternated d/t pt being extremely cold during sequential mode. 4.2L UF. Goal met

## 2016-07-23 NOTE — Progress Notes (Signed)
Post HD assessment, unchanged.  

## 2016-07-23 NOTE — Progress Notes (Signed)
Pre-Dialysis assessment. 

## 2016-07-23 NOTE — Progress Notes (Signed)
[facility]  ROUNDING NOTE   Subjective:   Patient seen during dialysis Tolerating fair BP dropped to 69 systolic.  Iv albumin given   HEMODIALYSIS FLOWSHEET:  Blood Flow Rate (mL/min): 400 mL/min Arterial Pressure (mmHg): -230 mmHg Venous Pressure (mmHg): 210 mmHg Transmembrane Pressure (mmHg): 70 mmHg Ultrafiltration Rate (mL/min): 1430 mL/min Dialysate Flow Rate (mL/min): 600 ml/min Conductivity: Machine : 14.1 Conductivity: Machine : 14.1 Dialysis Fluid Bolus: Normal Saline Bolus Amount (mL): 250 mL Dialysate Change:  (3k 2.5ca) Intra-Hemodialysis Comments: 3304. md at bedside (BP DROP, MD ordered more albumin)      Objective:  Vital signs in last 24 hours:  Temp:  [97.3 F (36.3 C)-98.8 F (37.1 C)] 98.8 F (37.1 C) (12/14 0929) Pulse Rate:  [69-91] 91 (12/14 1200) Resp:  [14-19] 19 (12/14 1200) BP: (69-117)/(54-95) 69/54 (12/14 1200) SpO2:  [83 %-100 %] 100 % (12/14 1200) Weight:  [105 kg (231 lb 6.4 oz)-105.4 kg (232 lb 5.8 oz)] 105.4 kg (232 lb 5.8 oz) (12/14 0929)  Weight change: -1.238 kg (-2 lb 11.7 oz) Filed Weights   07/22/16 0407 07/23/16 0351 07/23/16 0929  Weight: 106.4 kg (234 lb 9.6 oz) 105 kg (231 lb 6.4 oz) 105.4 kg (232 lb 5.8 oz)    Intake/Output: I/O last 3 completed shifts: In: 200 [P.O.:200] Out: 3000 [Other:3000]   Intake/Output this shift:  No intake/output data recorded.  Physical Exam: General: NAD, sitting up in bed  Head: Normocephalic, atraumatic. Moist oral mucosal membranes  Eyes: Anicteric,    Neck: Supple, trachea midline  Lungs:  Mild basilar crackles, [place] O2   Heart: irregular rhythm  Abdomen:  Soft, nontender, +distended  Extremities:  ++ Generalized edema.  Neurologic: Nonfocal, moving all four extremities  Skin: Dry skin with scratch marks  Access: Right arm AVF    Basic Metabolic Panel:  Recent Labs Lab 07/17/16 0428 07/18/16 0457 07/19/16 0449 07/20/16 0332  NA 131* 131* 133* 132*  K  3.9 4.2 3.9 4.4  CL 92* 93* 95* 93*  CO2 31 29 32 31  GLUCOSE 120* 237* 165* 152*  BUN 16 17 20  28*  CREATININE 2.88* 3.07* 3.01* 3.88*  CALCIUM 8.6* 8.4* 8.7* 8.8*  PHOS  --   --   --  2.0*    Liver Function Tests:  Recent Labs Lab 07/20/16 0332  ALBUMIN 2.7*   No results for input(s): LIPASE, AMYLASE in the last 168 hours. No results for input(s): AMMONIA in the last 168 hours.  CBC:  Recent Labs Lab 07/20/16 0332 07/22/16 1830 07/23/16 0656  WBC 6.1 6.8 6.0  HGB 11.1* 11.8* 11.4*  HCT 34.0* 36.7* 34.6*  MCV 88.9 88.6 87.2  PLT 154 188 189    Cardiac Enzymes: No results for input(s): CKTOTAL, CKMB, CKMBINDEX, TROPONINI in the last 168 hours.  BNP: Invalid input(s): POCBNP  CBG:  Recent Labs Lab 07/22/16 0717 07/22/16 1114 07/22/16 1639 07/22/16 2134 07/23/16 0805  GLUCAP 171* 142* 155* 133* 145*    Microbiology: Results for orders placed or performed during the hospital encounter of 07/13/16  MRSA PCR Screening     Status: None   Collection Time: 07/13/16 10:11 PM  Result Value Ref Range Status   MRSA by PCR NEGATIVE NEGATIVE Final    Comment:        The GeneXpert MRSA Assay (FDA approved for NASAL specimens only), is one component of a comprehensive MRSA colonization surveillance program. It is not intended to diagnose MRSA infection nor to guide or  monitor treatment for MRSA infections.     Coagulation Studies:  Recent Labs  07/22/16 1830 07/23/16 0656  LABPROT 18.3* 17.6*  INR 1.50 1.43    Urinalysis: No results for input(s): COLORURINE, LABSPEC, PHURINE, GLUCOSEU, HGBUR, BILIRUBINUR, KETONESUR, PROTEINUR, UROBILINOGEN, NITRITE, LEUKOCYTESUR in the last 72 hours.  Invalid input(s): APPERANCEUR    Imaging: No results found.   Medications:    . amiodarone  200 mg Oral Daily  . aspirin EC  81 mg Oral Daily  . atorvastatin  20 mg Oral QHS  . docusate  100 mg Oral Daily  . feeding supplement (ENSURE ENLIVE)  237 mL Oral  TID BM  . heparin  5,000 Units Subcutaneous Q8H  . hydrocortisone cream  1 application Topical TID  . insulin aspart  0-9 Units Subcutaneous TID WC  . mouth rinse  15 mL Mouth Rinse BID  . midodrine  5 mg Oral TID WC  . sodium chloride flush  3 mL Intravenous Q12H  . Warfarin - Pharmacist Dosing Inpatient   Does not apply q1800   acetaminophen, albuterol, guaiFENesin-codeine, lactulose, LORazepam  Assessment/ Plan:  [name] with ESRD on hemodialysis, atrial fibrillation, mural thrombus, systolic congestive heart failure, cocaine abuse  MWF [facility] [address] [facility]  1. ESRD with volume overload and acute pulmonary edema. Daily dialysis. UF since admission  Almost 24.5 litres - Next treatment possibly tomorrow for volume removal - IV albumin with treatment.   2. Systolic congestive heart failure: acute exacerbation - echocardiogram < 20 %   3. Chronic hypotension:  due to congestive heart failure.  Continue Midodrine  4. Anemia of chronic kidney disease: hemoglobin at goal, 11.4 - hold epo  5. Secondary Hyperparathyroidism: phos low at 2.0 - held sevelamer  6. Hyponatremia: from volume overload.   LOS: 10 [name] 12/14/201712:22 PM

## 2016-07-23 NOTE — Progress Notes (Signed)
Pre Dialysis 

## 2016-07-23 NOTE — Progress Notes (Addendum)
ANTICOAGULATION CONSULT NOTE - Initial Consult  Pharmacy Consult for warfarin Indication: atrial fibrillation and hx of mural thrombus  No Known Allergies  Patient Measurements: Height: 6' (182.9 cm) Weight: 232 lb 5.8 oz (105.4 kg) IBW/kg (Calculated) : 77.6   Vital Signs: Temp: 98.8 F (37.1 C) (12/14 0929) Temp Source: Oral (12/14 0929) BP: 91/43 (12/14 1300) Pulse Rate: 75 (12/14 1300)  Labs:  Recent Labs  07/22/16 1830 07/23/16 0656  HGB 11.8* 11.4*  HCT 36.7* 34.6*  PLT 188 189  LABPROT 18.3* 17.6*  INR 1.50 1.43    Estimated Creatinine Clearance: 23.2 mL/min (by C-G formula based on SCr of 3.88 mg/dL (H)).   Medical History: Past Medical History:  Diagnosis Date  . Anemia of chronic disease   . Apical mural thrombus   . Arthritis   . Chronic systolic heart failure (HCC)    a) ECHO [facility] 02/2014): EF <15%, multiple apical thrombi, RV mod enlarged, mod MR b. RHC (02/21/14) at [facility]: RA 16, RV 64/9 (15), PA 64/32 (42), PCWP: 24, CI: 1.4  . Diabetes mellitus with complication (HCC)   . Drug use    cocaine  . ESRD on hemodialysis (HCC)    a. MWF  . H/O noncompliance with medical treatment, presenting hazards to health   . HLD (hyperlipidemia)   . HTN (hypertension)   . Intrahepatic cholestasis 02/20/2016  . Mitral regurgitation   . PAF (paroxysmal atrial fibrillation) (HCC)   . Pneumonia       Assessment: 65 yo male restarting warfarin for hx of mural thrombus and PAF. Past records show warfarin dose of 2 mg daily. Pt has been off of warfarin per notes due to compliance issues; planning to d/c to nursing home.   DATE               INR              DOSE  12/13  1.50  3mg  12/14  1.43  3mg   12/15   Goal of Therapy:  INR 2-3 Monitor platelets by anticoagulation protocol: Yes   Plan:  INR remains subtherapeutic. Will give another dose of warfarin 3mg  tonight.  Pt also on amiodarone. INR ordered daily. D/C heparin when INR therapeutic.   Gardner Candle, PharmD, BCPS Clinical Pharmacist 07/23/2016 1:13 PM

## 2016-07-23 NOTE — Progress Notes (Signed)
PT Cancellation Note  Patient Details Name: [name] MRN: [id number] DOB: Mar 09, 1951   Cancelled Treatment:    Reason Eval/Treat Not Completed: Patient at procedure or test/unavailable; Pt at dialysis thiis morning and has not returned to room at this time.  RN notified PT of low BP, will monitor at next treatment.  Will continue to follow as able.  [name], PT 07/23/2016, 1:15 PM

## 2016-07-23 NOTE — Care Management (Signed)
Spoke with palliative care. Patient's daugther  [name] is coming in town this weekend from [place].  There is also discussion that if patient goes back to skilled nursing, that he go to the nursing facility where his mother is residing- [facility] in [place].  Discussed with palliative that if patient continues to receive dialysis, it becomes more complicated because would have to transfer dialysis center. patient also has [facility] which is not always accepted by facilities.  If patient found to be medically stable over the weekend and can discharge back to skilled nursing, will have patient to return to his current facility with palliative care to follow while family gathers  and  makes decision regarding goals of care.

## 2016-07-24 LAB — PROTIME-INR
INR: 1.64
INR: 1.62
PROTHROMBIN TIME: 19.6 s — AB (ref 11.4–15.2)
Prothrombin Time: 19.4 seconds — ABNORMAL HIGH (ref 11.4–15.2)

## 2016-07-24 LAB — GLUCOSE, CAPILLARY
GLUCOSE-CAPILLARY: 121 mg/dL — AB (ref 65–99)
GLUCOSE-CAPILLARY: 154 mg/dL — AB (ref 65–99)
Glucose-Capillary: 149 mg/dL — ABNORMAL HIGH (ref 65–99)
Glucose-Capillary: 193 mg/dL — ABNORMAL HIGH (ref 65–99)

## 2016-07-24 MED ORDER — WARFARIN SODIUM 1 MG PO TABS
4.0000 mg | ORAL_TABLET | Freq: Once | ORAL | Status: AC
  Administered 2016-07-24: 4 mg via ORAL
  Filled 2016-07-24: qty 4

## 2016-07-24 NOTE — Progress Notes (Signed)
[facility] - [place] at [facility]   PATIENT NAME: [name]    MR#:  [id number]  DATE OF BIRTH:  [birth year]   Patient is seen  today. Patient is very sleepy and falling asleep during my examination according to the nurse's report patient was wide awake last night  Patient is lethargic, asking for pain medicine but when I went to see him he is sound asleep and the denying any pain.  REVIEW OF SYSTEMS:   Review of Systems  Unable to perform ROS: Other  Constitutional: Negative for chills, fever and weight loss.  HENT: Negative for ear discharge, ear pain and nosebleeds.   Eyes: Negative for blurred vision, pain and discharge.  Respiratory: Positive for shortness of breath. Negative for sputum production, wheezing and stridor.   Cardiovascular: Positive for orthopnea, leg swelling and PND. Negative for chest pain and palpitations.  Gastrointestinal: Negative for abdominal pain, diarrhea, nausea and vomiting.  Genitourinary: Negative for frequency and urgency.  Musculoskeletal: Negative for back pain and joint pain.  Neurological: Positive for weakness. Negative for sensory change, speech change and focal weakness.  Psychiatric/Behavioral: Negative for depression and hallucinations. The patient is not nervous/anxious.    Tolerating Diet:yes Tolerating PT: pending  DRUG ALLERGIES:  No Known Allergies  VITALS:  Blood pressure (!) 81/58, pulse 87, temperature 97.9 F (36.6 C), temperature source Oral, resp. rate 18, height 6' (1.829 m), weight 103 kg (227 lb 1.6 oz), SpO2 96 %.  PHYSICAL EXAMINATION:   Physical Exam  GENERAL:  65 y.o.-year-old patient lying in the bed with no acute distress. Chronically ill, Anasarca, Mostly sleeping. EYES: Pupils equal, round, reactive to light and accommodation. No scleral icterus. Extraocular muscles intact.  HEENT: Head atraumatic, normocephalic. Oropharynx and nasopharynx clear.  NECK:  Supple, no jugular  venous distention. No thyroid enlargement, no tenderness.  LUNGS: decreased breath sounds bilaterally, no wheezing, rales, rhonchi. No use of accessory muscles of respiration.  CARDIOVASCULAR: S1, S2 normal. No murmurs, rubs, or gallops.  ABDOMEN: Soft, nontender, nondistended. Bowel sounds present. No organomegaly or mass.  EXTREMITIES: No cyanosis, clubbing  ++++ edema b/l.    NEUROLOGIC: Cranial nerves II through XII are intact. No focal Motor or sensory deficits b/l.   PSYCHIATRIC:  patient is alert and oriented x 3.  SKIN: No obvious rash, lesion, or ulcer.   LABORATORY PANEL:  CBC  Recent Labs Lab 07/23/16 0656  WBC 6.0  HGB 11.4*  HCT 34.6*  PLT 189    Chemistries   Recent Labs Lab 07/20/16 0332  NA 132*  K 4.4  CL 93*  CO2 31  GLUCOSE 152*  BUN 28*  CREATININE 3.88*  CALCIUM 8.8*   Cardiac Enzymes No results for input(s): TROPONINI in the last 168 hours. RADIOLOGY:  No results found. ASSESSMENT AND PLAN:   [name]  is a 65 y.o. male with a known history of Chronic anemia, severe cardiomyopathy with ejection fraction less than 15%, chronic kidney disease and ESRD on hemodialysis, diabetes, there abuse, hyperlipidemia, hypertension- claims to be compliant with his dialysis in his medications. He started noticing generalized swelling and edema for last few days. Also feeling short of breath so came to emergency room.  * Acute hypoxic resp failure 2/2 acute  on chronic systolic congestive heart failure and Anasarca with end-stage renal disease. EF -20 % Getting hemodialysis , total ultrafiltration since admission 24.5 litres.  * Persistent hypotension Midodrine 10 mg po tid Started and blood pressure is little  better    * FTT  Because of poor quality of life with ESRD, severe systolic heart failure, diabetes, atrial flutter, chronic hypertension, debility palliative care consult placed Family is planning to have a meeting on Saturday      -  *  Diabetes';; resolved hypoglycemia.  sugar is acceptable to 150s.  *History of atrial flutter versus atrial tachycardia: Continue amiodarone, consider EP consultation, History of mural thrombus is not identified on recent echo,  Warfarin started,INR 1.43 patient noncompliant  ,* severe malnutrition on IV albumin for hypotension and poor volume status due to cardiomyopathy -dietitian to follow   Goals of care Palliative care involved. Dr. Guadlupe [place]ila [facility] We will have a family meeting tomorrow at 12:30 with niece and nephew and daughter will be joining via teleconference. Dr. Thedore MinsSingh is also planning to join the family meeting  Physical Therapy recommended rehabilitation but the patient is not going to rehabilitation because of persistent  hypotension.    CM for d/c planning; [facility] when arrangements are made  Case discussed with Care Management/Social Worker. Management plans discussed with the patient, family and they are in agreement.  CODE STATUS:DNR\ Prognosis is extremely poor. Palliative care arranging family meeting/ hospice evaluation.  DVT Prophylaxis: heparin  TOTAL critical TIME TAKING CARE OF THIS PATIENT: 32 minutes.  >50% time spent on counselling and coordination of care  POSSIBLE D/C IN 2-3 DAYS, DEPENDING ON CLINICAL CONDITION.  Note: This dictation was prepared with Dragon dictation along with smaller phrase technology. Any transcriptional errors that result from this process are unintentional.  Ramonita LabGouru, Yachet Mattson M.D on 07/24/2016 at 4:35 PM  Between 7am to 6pm - Pager - [phone]  After 6pm go to [email or web address] - password [email or web address] [facility]  [facility]  Office  [phone]  CC: Primary care physician; Dimple CaseySean A Smith, MD

## 2016-07-24 NOTE — Progress Notes (Signed)
ANTICOAGULATION CONSULT NOTE - Initial Consult  Pharmacy Consult for warfarin Indication: atrial fibrillation and hx of mural thrombus  No Known Allergies  Patient Measurements: Height: 6' (182.9 cm) Weight: 227 lb 1.6 oz (103 kg) IBW/kg (Calculated) : 77.6   Vital Signs: Temp: 97.8 F (36.6 C) (12/15 0732) Temp Source: Oral (12/15 0732) BP: 85/64 (12/15 0732) Pulse Rate: 89 (12/15 0732)  Labs:  Recent Labs  07/22/16 1830 07/23/16 0656 07/24/16 0506 07/24/16 0751  HGB 11.8* 11.4*  --   --   HCT 36.7* 34.6*  --   --   PLT 188 189  --   --   LABPROT 18.3* 17.6* 19.6* 19.4*  INR 1.50 1.43 1.64 1.62    Estimated Creatinine Clearance: 22.9 mL/min (by C-G formula based on SCr of 3.88 mg/dL (H)).   Medical History: Past Medical History:  Diagnosis Date  . Anemia of chronic disease   . Apical mural thrombus   . Arthritis   . Chronic systolic heart failure (HCC)    a) ECHO [facility] 02/2014): EF <15%, multiple apical thrombi, RV mod enlarged, mod MR b. RHC (02/21/14) at [facility]: RA 16, RV 64/9 (15), PA 64/32 (42), PCWP: 24, CI: 1.4  . Diabetes mellitus with complication (HCC)   . Drug use    cocaine  . ESRD on hemodialysis (HCC)    a. MWF  . H/O noncompliance with medical treatment, presenting hazards to health   . HLD (hyperlipidemia)   . HTN (hypertension)   . Intrahepatic cholestasis 02/20/2016  . Mitral regurgitation   . PAF (paroxysmal atrial fibrillation) (HCC)   . Pneumonia       Assessment: 65 yo male restarting warfarin for hx of mural thrombus and PAF. Past records show warfarin dose of 2 mg daily. Pt has been off of warfarin per notes due to compliance issues; planning to d/c to nursing home.   DATE               INR              DOSE  12/13  1.50  3mg  12/14  1.43  3mg   12/15               1.64                 4mg    Goal of Therapy:  INR 2-3 Monitor platelets by anticoagulation protocol: Yes   Plan:  INR remains subtherapeutic. Will give dose of  warfarin 4mg  tonight.  Pt also on amiodarone. INR ordered daily. D/C heparin when INR therapeutic.   Gardner Candle, PharmD, BCPS Clinical Pharmacist 07/24/2016 11:56 AM

## 2016-07-24 NOTE — Progress Notes (Signed)
Daily Progress Note   Patient Name: [name]       Date: 07/24/2016 DOB: 1950-10-21  Age: 65 y.o. MRN#: [id number] Attending Physician: Ramonita Lab, MD Primary Care Physician: Dimple Casey, MD Admit Date: 07/13/2016  Reason for Consultation/Follow-up: Establishing goals of care  Subjective: Patient more lethargic this afternoon. No signs of pain or discomfort.   Updated daughter, [name] via telephone. She is traveling from [place]. Tomorrow morning in hopes of talking with her father and family about goals of care and our various conversations through the week. If at all possible, [name] would like for her father to go to [place] and live at [facility], where his mother is at. Discussed with SW and explained to [name] this will be a process with finding a dialysis center and he will likely be discharged back to [facility] if stable. Answered questions and offered support.   Length of Stay: 11  Current Medications: Scheduled Meds:  . amiodarone  200 mg Oral Daily  . aspirin EC  81 mg Oral Daily  . atorvastatin  20 mg Oral QHS  . docusate  100 mg Oral Daily  . feeding supplement (ENSURE ENLIVE)  237 mL Oral TID BM  . heparin  5,000 Units Subcutaneous Q8H  . hydrocortisone cream  1 application Topical TID  . insulin aspart  0-9 Units Subcutaneous TID WC  . mouth rinse  15 mL Mouth Rinse BID  . midodrine  10 mg Oral TID WC  . sodium chloride flush  3 mL Intravenous Q12H  . Warfarin - Pharmacist Dosing Inpatient   Does not apply q1800    Continuous Infusions:  PRN Meds: acetaminophen, albuterol, guaiFENesin-codeine, lactulose, LORazepam  Physical Exam  Constitutional: He appears ill.  Cardiovascular: An irregularly irregular rhythm present. Exam reveals distant  heart sounds.   Pulmonary/Chest: No accessory muscle usage. He has decreased breath sounds.  Abdominal: Soft. Bowel sounds are decreased.  Neurological: He is alert.  Oriented to person and place. Very forgetful/confused with intermittent agitation.   Skin: Skin is dry.  Psychiatric: He has a normal mood and affect. His speech is normal. He is agitated. Cognition and memory are impaired.  Nursing note and vitals reviewed.          Vital Signs: BP (!) 85/64 (BP Location: Left Arm)  Pulse 89   Temp 97.8 F (36.6 C) (Oral)   Resp 16   Ht 6' (1.829 m)   Wt 103 kg (227 lb 1.6 oz)   SpO2 98%   BMI 30.80 kg/m  SpO2: SpO2: 98 % O2 Device: O2 Device: Nasal Cannula O2 Flow Rate: O2 Flow Rate (L/min): 3 L/min  Intake/output summary:   Intake/Output Summary (Last 24 hours) at 07/24/16 1145 Last data filed at 07/24/16 1008  Gross per 24 hour  Intake              237 ml  Output             4238 ml  Net            -4001 ml   LBM: Last BM Date: 07/24/16 Baseline Weight: Weight: 127 kg (280 lb) Most recent weight: Weight: 103 kg (227 lb 1.6 oz)       Palliative Assessment/Data: PPS 30%   Flowsheet Rows   Flowsheet Row Most Recent Value  Intake Tab  Referral Department  Hospitalist  Unit at Time of Referral  Cardiac/Telemetry Unit  Palliative Care Primary Diagnosis  Cardiac  Date Notified  07/21/16  Palliative Care Type  New Palliative care  Reason for referral  Clarify Goals of Care  Date of Admission  07/13/16  # of days IP prior to Palliative referral  8  Clinical Assessment  Palliative Performance Scale Score  30%  Psychosocial & Spiritual Assessment  Palliative Care Outcomes  Patient/Family meeting held?  Yes  Who was at the meeting?  patient and daughter via telephone  Palliative Care Outcomes  Clarified goals of care, Provided psychosocial or spiritual support, Counseled regarding hospice, Provided end of life care assistance      Patient Active Problem List    Diagnosis Date Noted  . Palliative care encounter   . Goals of care, counseling/discussion   . Encounter for hospice care discussion   . Pressure injury of skin 07/14/2016  . NICM (nonischemic cardiomyopathy) (HCC) 07/14/2016  . H/O noncompliance with medical treatment, presenting hazards to health 07/14/2016  . Anemia of chronic disease 07/14/2016  . Protein-calorie malnutrition, severe (HCC) 07/14/2016  . Malnutrition of moderate degree 07/14/2016  . Liver dysfunction   . Noncompliance with renal dialysis (HCC)   . ESRD (end stage renal disease) on dialysis (HCC) 05/03/2016  . Generalized weakness 05/02/2016  . Intrahepatic cholestasis 02/20/2016  . Direct hyperbilirubinemia   . Paroxysmal atrial fibrillation (HCC)   . Shortness of breath   . Type 2 diabetes mellitus with diabetic nephropathy, without long-term current use of insulin (HCC)   . End stage renal disease (HCC) 12/25/2014  . Atrial fibrillation [I48.91] 08/09/2014  . Encounter for therapeutic drug monitoring 08/09/2014  . Chronic systolic heart failure (HCC) 07/04/2014  . ESRD (end stage renal disease) (HCC) 07/04/2014  . PAF (paroxysmal atrial fibrillation) (HCC) 07/04/2014  . Controlled diabetes mellitus type II without complication (HCC) 06/22/2014  . Gout 06/22/2014  . ESRD needing dialysis (HCC)   . Pulmonary edema   . Bacteremia associated with intravascular line (HCC)   . Blood poisoning (HCC)   . Acute respiratory failure with hypoxia (HCC)   . AKI (acute kidney injury) (HCC)   . Screen for STD (sexually transmitted disease)   . Acute respiratory failure (HCC) 06/08/2014  . Cardiogenic shock (HCC) 06/08/2014  . Acute on chronic systolic congestive heart failure (HCC) 06/08/2014  . Acute on chronic renal failure (HCC) 06/08/2014  .  Hyperkalemia 06/08/2014  . Respiratory failure (HCC) 06/08/2014    Palliative Care Assessment & Plan   Patient Profile: 65 y.o. male  with past medical history of ESRD on  HD, cardiomyopathy, systolic congestive heart failure with EF <20%, diabetes, anemia of chronic disease, hypertension, hyperlipidemia, mitral regurgitation, and drug abuse admitted on 07/13/2016 with generalized swelling and shortness of breath. In ED, he was hypoxic requiring 3-4L [place]. Anasarca due to end-stage renal disease and acute on chronic congestive heart failure. Patient also with chronic hypotension requiring IV albumin during dialysis and midodrine. Overall poor prognosis. Palliative medicine consultation for goals of care.    Assessment: Acute on chronic systolic congestive heart failure Acute hypoxic respiratory failure Anasarca End-stage renal disease on dialysis Diabetes Atrial flutter Chronic hypotension  Recommendations/Plan:  Dr. Amado CoeGouru to meet with family tomorrow.  See palliative notes from this week regarding multiple conversations with family about GOC.   If patient discharges this weekend, please have palliative to follow at facility to continue GOC conversations.   [facility] but will f/u next week if still hospitalized.  Goals of Care and Additional Recommendations:  Limitations on Scope of Treatment: Full Scope Treatment-except DNR/DNI  Code Status: DNR   Code Status Orders        Start     Ordered   07/13/16 2203  Do not attempt resuscitation (DNR)  Continuous    Question Answer Comment  In the event of cardiac or respiratory ARREST Do not call a "code blue"   In the event of cardiac or respiratory ARREST Do not perform Intubation, CPR, defibrillation or ACLS   In the event of cardiac or respiratory ARREST Use medication by any route, position, wound care, and other measures to relive pain and suffering. May use oxygen, suction and manual treatment of airway obstruction as needed for comfort.      07/13/16 2202    Code Status History    Date Active Date Inactive Code Status Order ID Comments User Context   05/22/2016  2:49 AM  05/23/2016  7:07 PM Full Code [id number]  Gwendolyn FillBincy S Varughese, NP ED   05/03/2016 12:21 AM 05/06/2016  9:49 PM Full Code [id number]  Michael LitterNikki Carter, MD Inpatient   02/17/2016  7:38 PM 02/22/2016 11:44 PM DNR [id number]  Renne Muscaaniel L Warden, MD Inpatient   06/22/2014  2:36 PM 06/28/2014  8:24 PM Full Code [id number]  Berdine DanceMichael Shick, MD Inpatient   06/08/2014 12:10 PM 06/22/2014  2:36 PM Full Code [id number]  Alyson ReedyWesam G [name], MD ED       Prognosis:   Unable to determine  Discharge Planning:  To Be Determined  Care plan was discussed with patient, daughter, RN, Dr. Amado CoeGouru  Thank you for allowing the Palliative Medicine Team to assist in the care of this patient.   Time In: 1540 Time Out: 1615 Total Time 35min Prolonged Time Billed  yes      Greater than 50%  of this time was spent counseling and coordinating care related to the above assessment and plan.  Vennie HomansMegan Serafina Topham, FNP-C Palliative Medicine Team  Phone: [phone] Fax: [phone]  Please contact Palliative Medicine Team phone at [phone] for questions and concerns.

## 2016-07-24 NOTE — Progress Notes (Addendum)
Nutrition Follow-up  DOCUMENTATION CODES:   Non-severe (moderate) malnutrition in context of chronic illness  INTERVENTION:  Ensure Enlive po TID, each supplement provides 350 kcal and 20 grams of protein  Magic cup TID with meals, each supplement provides 290 kcal and 9 grams of protein  If continued aggressive care requested by family, consider nutrition support.   NUTRITION DIAGNOSIS:   Malnutrition related to chronic illness as evidenced by moderate depletion of body fat, moderate depletions of muscle mass, severe fluid accumulation.  GOAL:   Patient will meet greater than or equal to 90% of their needs  MONITOR:   PO intake, Supplement acceptance, Labs, Weight trends    ASSESSMENT:   65 yo male admitted with acute on chronic CHF, severe anasarca with ESRD on HD. Pt with CHF with EF 15%, CKD, HTN, HLD   Spoke to RN today. Pt eating <25% meals but is drinking Ensure. Pt was not receiving Magic Cups on trays; reordered these today. RN to encourage intake. No new labs since 12/11. Still on fluid restriction. Edema slightly improved; 2+ pitting today. Because of poor quality of life with ESRD, severe systolic heart failure, diabetes, atrial flutter, chronic hypertension, debility palliative care following. Family is planning to have a meeting on Saturday to make decision about continued aggressive care vs hospice.      Medications reviewed and include: aspirin, colace, heparin, insulin, warfarin    Labs reviewed: no new labs since 12/11- K 4.4 wnl, Na 132(L), BUN 28(H), creat 3.88(H), P 2.0(L), Alb 2.7(L)  Diet Order:  Diet renal/carb modified with fluid restriction Diet-HS Snack? Nothing; Room service appropriate? Yes; Fluid consistency: Thin; Fluid restriction: 1200 mL Fluid  Skin:  Wound (see comment) (stage II coccyx)  Last BM:  12/15  Height:   Ht Readings from Last 1 Encounters:  07/13/16 6' (1.829 m)    Weight:   Wt Readings from Last 1 Encounters:  07/24/16  227 lb 1.6 oz (103 kg)    Ideal Body Weight:     BMI:  Body mass index is 30.8 kg/m.  Estimated Nutritional Needs:   Kcal:  2000-2400kcal/day   Protein:  124-144g/day   Fluid:  or per MD discretion   EDUCATION NEEDS:   No education needs identified at this time  Betsey Holiday, RD, LDN

## 2016-07-24 NOTE — Progress Notes (Signed)
PT Cancellation Note  Patient Details Name: [name] MRN: [id number] DOB: 1951/05/21   Cancelled Treatment:    Reason Eval/Treat Not Completed: Medical issues which prohibited therapy. Pt with low BP this date with systolic in 70-80s. Last BP at 81/58. Pt is not appropriate for OOB mobility and continues to be lethargic per chart review. Will re-attempt when stable.   Emylie Amster 07/24/2016, 3:19 PM  Elizabeth Palau, PT, DPT [phone]

## 2016-07-24 NOTE — Progress Notes (Signed)
[facility]  ROUNDING NOTE   Subjective:   Patient remains critically ill Lethargic Answers minimal questions Continues to remain significantly volume overload   Objective:  Vital signs in last 24 hours:  Temp:  [97.4 F (36.3 C)-97.9 F (36.6 C)] 97.9 F (36.6 C) (12/15 1204) Pulse Rate:  [67-90] 87 (12/15 1204) Resp:  [16-18] 18 (12/15 1204) BP: (78-85)/(47-64) 81/58 (12/15 1204) SpO2:  [95 %-98 %] 96 % (12/15 1204) Weight:  [103 kg (227 lb 1.6 oz)] 103 kg (227 lb 1.6 oz) (12/15 0500)  Weight change: 0.438 kg (15.4 oz) Filed Weights   07/23/16 0929 07/23/16 1310 07/24/16 0500  Weight: 105.4 kg (232 lb 5.8 oz) 103.2 kg (227 lb 8.2 oz) 103 kg (227 lb 1.6 oz)    Intake/Output: I/O last 3 completed shifts: In: 0  Out: 4238 [Other:4238]   Intake/Output this shift:  Total I/O In: 237 [P.O.:237] Out: -   Physical Exam: General: NAD, sitting up in bed  Head: Normocephalic, atraumatic. Moist oral mucosal membranes  Eyes: Anicteric,    Neck: Supple, trachea midline  Lungs:  Mild basilar crackles, [place] O2   Heart: irregular rhythm  Abdomen:  Soft, nontender, +distended  Extremities:  ++ Generalized edema.  Neurologic: Nonfocal, moving all four extremities  Skin: Dry skin with scratch marks  Access: Right arm AVF    Basic Metabolic Panel:  Recent Labs Lab 07/18/16 0457 07/19/16 0449 07/20/16 0332  NA 131* 133* 132*  K 4.2 3.9 4.4  CL 93* 95* 93*  CO2 29 32 31  GLUCOSE 237* 165* 152*  BUN 17 20 28*  CREATININE 3.07* 3.01* 3.88*  CALCIUM 8.4* 8.7* 8.8*  PHOS  --   --  2.0*    Liver Function Tests:  Recent Labs Lab 07/20/16 0332  ALBUMIN 2.7*   No results for input(s): LIPASE, AMYLASE in the last 168 hours. No results for input(s): AMMONIA in the last 168 hours.  CBC:  Recent Labs Lab 07/20/16 0332 07/22/16 1830 07/23/16 0656  WBC 6.1 6.8 6.0  HGB 11.1* 11.8* 11.4*  HCT 34.0* 36.7* 34.6*  MCV 88.9 88.6 87.2  PLT 154 188 189     Cardiac Enzymes: No results for input(s): CKTOTAL, CKMB, CKMBINDEX, TROPONINI in the last 168 hours.  BNP: Invalid input(s): POCBNP  CBG:  Recent Labs Lab 07/23/16 1416 07/23/16 1634 07/23/16 2136 07/24/16 0736 07/24/16 1140  GLUCAP 141* 137* 120* 121* 154*    Microbiology: Results for orders placed or performed during the hospital encounter of 07/13/16  MRSA PCR Screening     Status: None   Collection Time: 07/13/16 10:11 PM  Result Value Ref Range Status   MRSA by PCR NEGATIVE NEGATIVE Final    Comment:        The GeneXpert MRSA Assay (FDA approved for NASAL specimens only), is one component of a comprehensive MRSA colonization surveillance program. It is not intended to diagnose MRSA infection nor to guide or monitor treatment for MRSA infections.     Coagulation Studies:  Recent Labs  07/22/16 1830 07/23/16 0656 07/24/16 0506 07/24/16 0751  LABPROT 18.3* 17.6* 19.6* 19.4*  INR 1.50 1.43 1.64 1.62    Urinalysis: No results for input(s): COLORURINE, LABSPEC, PHURINE, GLUCOSEU, HGBUR, BILIRUBINUR, KETONESUR, PROTEINUR, UROBILINOGEN, NITRITE, LEUKOCYTESUR in the last 72 hours.  Invalid input(s): APPERANCEUR    Imaging: No results found.   Medications:    . amiodarone  200 mg Oral Daily  . aspirin EC  81 mg Oral Daily  .  atorvastatin  20 mg Oral QHS  . docusate  100 mg Oral Daily  . feeding supplement (ENSURE ENLIVE)  237 mL Oral TID BM  . heparin  5,000 Units Subcutaneous Q8H  . hydrocortisone cream  1 application Topical TID  . insulin aspart  0-9 Units Subcutaneous TID WC  . mouth rinse  15 mL Mouth Rinse BID  . midodrine  10 mg Oral TID WC  . sodium chloride flush  3 mL Intravenous Q12H  . warfarin  4 mg Oral ONCE-1800  . Warfarin - Pharmacist Dosing Inpatient   Does not apply q1800   acetaminophen, albuterol, guaiFENesin-codeine, lactulose, LORazepam  Assessment/ Plan:  [name] with ESRD on  hemodialysis, atrial fibrillation, mural thrombus, systolic congestive heart failure, cocaine abuse  MWF [facility] [address] [facility]  1. ESRD with volume overload and acute pulmonary edema. Daily dialysis. UF since admission  Almost 28.5 litres - Next treatment on Saturday - Overall patient is not tolerating dialysis well as he the comes hypotensive whenever volume removal is attempted  2. Systolic congestive heart failure: acute exacerbation - echocardiogram < 20 %   3. Chronic hypotension:  due to congestive heart failure.  Continue Midodrine  4. Anemia of chronic kidney disease: hemoglobin at goal, 11.4 - hold epo  5. Secondary Hyperparathyroidism: phos low at 2.0 - held sevelamer  6. Hyponatremia: from volume overload.   LOS: 11 [name] 12/15/20172:16 PM

## 2016-07-24 NOTE — Clinical Social Work Note (Signed)
Patient is from [facility], MSW continuing to follow patient's progress throughout discharge planning.  [name], MSW [phone]  Mon-Fri 8a-4:30p 07/24/2016 4:35 PM

## 2016-07-25 LAB — GLUCOSE, CAPILLARY
GLUCOSE-CAPILLARY: 123 mg/dL — AB (ref 65–99)
GLUCOSE-CAPILLARY: 108 mg/dL — AB (ref 65–99)
Glucose-Capillary: 203 mg/dL — ABNORMAL HIGH (ref 65–99)
Glucose-Capillary: 141 mg/dL — ABNORMAL HIGH (ref 65–99)

## 2016-07-25 LAB — RENAL FUNCTION PANEL
ALBUMIN: 2.8 g/dL — AB (ref 3.5–5.0)
ANION GAP: 10 (ref 5–15)
BUN: 35 mg/dL — ABNORMAL HIGH (ref 6–20)
CHLORIDE: 94 mmol/L — AB (ref 101–111)
CO2: 31 mmol/L (ref 22–32)
Calcium: 9.1 mg/dL (ref 8.9–10.3)
Creatinine, Ser: 4.3 mg/dL — ABNORMAL HIGH (ref 0.61–1.24)
GFR calc Af Amer: 15 mL/min — ABNORMAL LOW (ref >60)
GFR, EST NON AFRICAN AMERICAN: 13 mL/min — AB (ref >60)
Glucose, Bld: 145 mg/dL — ABNORMAL HIGH (ref 65–99)
PHOSPHORUS: 2.4 mg/dL — AB (ref 2.5–4.6)
POTASSIUM: 4.5 mmol/L (ref 3.5–5.1)
Sodium: 135 mmol/L (ref 135–145)

## 2016-07-25 LAB — HEMOGLOBIN: HEMOGLOBIN: 11.5 g/dL — AB (ref 13.0–18.0)

## 2016-07-25 LAB — PROTIME-INR
INR: 1.65
PROTHROMBIN TIME: 19.7 s — AB (ref 11.4–15.2)

## 2016-07-25 MED ORDER — WARFARIN SODIUM 1 MG PO TABS
4.0000 mg | ORAL_TABLET | Freq: Once | ORAL | Status: AC
  Administered 2016-07-25: 4 mg via ORAL
  Filled 2016-07-25: qty 4

## 2016-07-25 NOTE — Progress Notes (Signed)
  End of hd 

## 2016-07-25 NOTE — Progress Notes (Signed)
[facility]  ROUNDING NOTE   Subjective:   Patient remains critically ill Continues to remain significantly volume overload   HEMODIALYSIS FLOWSHEET:  Blood Flow Rate (mL/min): 400 mL/min Arterial Pressure (mmHg): -200 mmHg Venous Pressure (mmHg): 200 mmHg Transmembrane Pressure (mmHg): 60 mmHg Ultrafiltration Rate (mL/min): 1140 mL/min Dialysate Flow Rate (mL/min): 800 ml/min Conductivity: Machine : 14.4 Conductivity: Machine : 14.4 Dialysis Fluid Bolus: Normal Saline Bolus Amount (mL): 250 mL (prime) Dialysate Change:  (3k) Intra-Hemodialysis Comments: 1062. pt alert, no c/o, vss for baseline     Objective:  Vital signs in last 24 hours:  Temp:  [97.5 F (36.4 C)-97.8 F (36.6 C)] 97.8 F (36.6 C) (12/16 1050) Pulse Rate:  [87-93] 93 (12/16 1155) Resp:  [17-21] 17 (12/16 1155) BP: (70-85)/(51-60) 74/58 (12/16 1155) SpO2:  [80 %-100 %] 98 % (12/16 1155) Weight:  [102 kg (224 lb 14.4 oz)-103.2 kg (227 lb 8.2 oz)] 103.2 kg (227 lb 8.2 oz) (12/16 1050)  Weight change: -3.386 kg (-7 lb 7.4 oz) Filed Weights   07/24/16 0500 07/25/16 0500 07/25/16 1050  Weight: 103 kg (227 lb 1.6 oz) 102 kg (224 lb 14.4 oz) 103.2 kg (227 lb 8.2 oz)    Intake/Output: I/O last 3 completed shifts: In: 237 [P.O.:237] Out: -    Intake/Output this shift:  No intake/output data recorded.  Physical Exam: General: NAD, sitting up in bed  Head: Normocephalic, atraumatic. Moist oral mucosal membranes  Eyes: Anicteric,    Neck: Supple, trachea midline  Lungs:  Mild basilar crackles, [place] O2   Heart: irregular rhythm  Abdomen:  Soft, nontender, +distended  Extremities:  ++ Generalized edema.  Neurologic: alert  Skin: Dry skin with scratch marks  Access: Right arm AVF    Basic Metabolic Panel:  Recent Labs Lab 07/19/16 0449 07/20/16 0332  NA 133* 132*  K 3.9 4.4  CL 95* 93*  CO2 32 31  GLUCOSE 165* 152*  BUN 20 28*  CREATININE 3.01* 3.88*  CALCIUM 8.7* 8.8*   PHOS  --  2.0*    Liver Function Tests:  Recent Labs Lab 07/20/16 0332  ALBUMIN 2.7*   No results for input(s): LIPASE, AMYLASE in the last 168 hours. No results for input(s): AMMONIA in the last 168 hours.  CBC:  Recent Labs Lab 07/20/16 0332 07/22/16 1830 07/23/16 0656  WBC 6.1 6.8 6.0  HGB 11.1* 11.8* 11.4*  HCT 34.0* 36.7* 34.6*  MCV 88.9 88.6 87.2  PLT 154 188 189    Cardiac Enzymes: No results for input(s): CKTOTAL, CKMB, CKMBINDEX, TROPONINI in the last 168 hours.  BNP: Invalid input(s): POCBNP  CBG:  Recent Labs Lab 07/24/16 0736 07/24/16 1140 07/24/16 1647 07/24/16 2122 07/25/16 0729  GLUCAP 121* 154* 149* 193* 203*    Microbiology: Results for orders placed or performed during the hospital encounter of 07/13/16  MRSA PCR Screening     Status: None   Collection Time: 07/13/16 10:11 PM  Result Value Ref Range Status   MRSA by PCR NEGATIVE NEGATIVE Final    Comment:        The GeneXpert MRSA Assay (FDA approved for NASAL specimens only), is one component of a comprehensive MRSA colonization surveillance program. It is not intended to diagnose MRSA infection nor to guide or monitor treatment for MRSA infections.     Coagulation Studies:  Recent Labs  07/22/16 1830 07/23/16 0656 07/24/16 0506 07/24/16 0751 07/25/16 0606  LABPROT 18.3* 17.6* 19.6* 19.4* 19.7*  INR 1.50 1.43 1.64 1.62  1.65    Urinalysis: No results for input(s): COLORURINE, LABSPEC, PHURINE, GLUCOSEU, HGBUR, BILIRUBINUR, KETONESUR, PROTEINUR, UROBILINOGEN, NITRITE, LEUKOCYTESUR in the last 72 hours.  Invalid input(s): APPERANCEUR    Imaging: No results found.   Medications:    . amiodarone  200 mg Oral Daily  . aspirin EC  81 mg Oral Daily  . atorvastatin  20 mg Oral QHS  . docusate  100 mg Oral Daily  . feeding supplement (ENSURE ENLIVE)  237 mL Oral TID BM  . heparin  5,000 Units Subcutaneous Q8H  . hydrocortisone cream  1 application Topical TID   . insulin aspart  0-9 Units Subcutaneous TID WC  . mouth rinse  15 mL Mouth Rinse BID  . midodrine  10 mg Oral TID WC  . sodium chloride flush  3 mL Intravenous Q12H  . warfarin  4 mg Oral ONCE-1800  . Warfarin - Pharmacist Dosing Inpatient   Does not apply q1800   acetaminophen, albuterol, guaiFENesin-codeine, lactulose, LORazepam  Assessment/ Plan:  [name] is a 65 y.o. black male with ESRD on hemodialysis, atrial fibrillation, mural thrombus, systolic congestive heart failure, cocaine abuse  MWF [facility] [address] [facility]  1. ESRD with volume overload and acute pulmonary edema. Daily dialysis. UF since admission  Almost 28.5 litres - Overall patient is not tolerating dialysis well as he the comes hypotensive whenever volume removal is attempted.  - This is preventing his discharge from hospital - Midodrine 10 TID   2. Volume overload Chronic Systolic congestive heart failure: - echocardiogram < 20 %  - weight down from admission 127 kg to 103 kg  3. Chronic hypotension:  due to congestive heart failure.  Continue Midodrine  4. Anemia of chronic kidney disease: hemoglobin, 11.4 - hold epo  5. Secondary Hyperparathyroidism: phos low at 2.0 - held sevelamer  6. Hyponatremia: from volume overload.   LOS: 12 [name] 12/16/201712:13 PM

## 2016-07-25 NOTE — Progress Notes (Signed)
Start of hd 

## 2016-07-25 NOTE — Progress Notes (Signed)
ANTICOAGULATION CONSULT NOTE - Initial Consult  Pharmacy Consult for warfarin Indication: atrial fibrillation and hx of mural thrombus  No Known Allergies  Patient Measurements: Height: 6' (182.9 cm) Weight: 224 lb 14.4 oz (102 kg) IBW/kg (Calculated) : 77.6   Vital Signs: Temp: 97.5 F (36.4 C) (12/16 0548) Temp Source: Oral (12/16 0548) BP: 85/60 (12/16 0840) Pulse Rate: 87 (12/16 0840)  Labs:  Recent Labs  07/22/16 1830 07/23/16 0656 07/24/16 0506 07/24/16 0751 07/25/16 0606  HGB 11.8* 11.4*  --   --   --   HCT 36.7* 34.6*  --   --   --   PLT 188 189  --   --   --   LABPROT 18.3* 17.6* 19.6* 19.4* 19.7*  INR 1.50 1.43 1.64 1.62 1.65    Estimated Creatinine Clearance: 22.8 mL/min (by C-G formula based on SCr of 3.88 mg/dL (H)).   Medical History: Past Medical History:  Diagnosis Date  . Anemia of chronic disease   . Apical mural thrombus   . Arthritis   . Chronic systolic heart failure (HCC)    a) ECHO [facility] 02/2014): EF <15%, multiple apical thrombi, RV mod enlarged, mod MR b. RHC (02/21/14) at [facility]: RA 16, RV 64/9 (15), PA 64/32 (42), PCWP: 24, CI: 1.4  . Diabetes mellitus with complication (HCC)   . Drug use    cocaine  . ESRD on hemodialysis (HCC)    a. MWF  . H/O noncompliance with medical treatment, presenting hazards to health   . HLD (hyperlipidemia)   . HTN (hypertension)   . Intrahepatic cholestasis 02/20/2016  . Mitral regurgitation   . PAF (paroxysmal atrial fibrillation) (HCC)   . Pneumonia       Assessment: 65 yo male restarting warfarin for hx of mural thrombus and PAF. Past records show warfarin dose of 2 mg daily. Pt has been off of warfarin per notes due to compliance issues; planning to d/c to nursing home.   DATE               INR              DOSE  12/13  1.50  3mg  12/14  1.43  3mg   12/15               1.64                 4mg  12/16               1.65                4 mg    Goal of Therapy:  INR 2-3 Monitor platelets by  anticoagulation protocol: Yes   Plan:  INR remains subtherapeutic. Will give dose of warfarin 4mg  tonight.  Pt also on amiodarone. INR ordered daily. D/C heparin when INR therapeutic.   Demetrius Charity, PharmD Clinical Pharmacist 07/25/2016 10:39 AM

## 2016-07-25 NOTE — Progress Notes (Signed)
Pre hd info 

## 2016-07-25 NOTE — Progress Notes (Signed)
Pre hd assessment  

## 2016-07-25 NOTE — Progress Notes (Signed)
[facility] [place] [facility]  PATIENT NAME: [name]    MR#:  [id number]  DATE OF BIRTH:  Dec 18, 1950    Patient is seen  today. Patient is very sleepy and refused iv placement Patient is lethargic, asking for pain medicine but when I went to see him he is sound asleep and the denying any pain.  REVIEW OF SYSTEMS:   Review of Systems  Unable to perform ROS: Other  Constitutional: Negative for chills, fever and weight loss.  HENT: Negative for ear discharge, ear pain and nosebleeds.   Eyes: Negative for blurred vision, pain and discharge.  Respiratory: Positive for shortness of breath. Negative for sputum production, wheezing and stridor.   Cardiovascular: Positive for orthopnea, leg swelling and PND. Negative for chest pain and palpitations.  Gastrointestinal: Negative for abdominal pain, diarrhea, nausea and vomiting.  Genitourinary: Negative for frequency and urgency.  Musculoskeletal: Negative for back pain and joint pain.  Neurological: Positive for weakness. Negative for sensory change, speech change and focal weakness.  Psychiatric/Behavioral: Negative for depression and hallucinations. The patient is not nervous/anxious.    Tolerating Diet:yes Tolerating PT: pending  DRUG ALLERGIES:  No Known Allergies  VITALS:  Blood pressure (!) 84/56, pulse 91, temperature 98.1 F (36.7 C), temperature source Oral, resp. rate 16, height 6' (1.829 m), weight 101.3 kg (223 lb 5.2 oz), SpO2 99 %.  PHYSICAL EXAMINATION:   Physical Exam  GENERAL:  65 y.o.-year-old patient lying in the bed with no acute distress. Chronically ill, Anasarca, Mostly sleeping. EYES: Pupils equal, round, reactive to light and accommodation. No scleral icterus. Extraocular muscles intact.  HEENT: Head atraumatic, normocephalic. Oropharynx and nasopharynx clear.  NECK:  Supple, no jugular venous distention. No thyroid enlargement, no tenderness.  LUNGS: decreased breath  sounds bilaterally, no wheezing, rales, rhonchi. No use of accessory muscles of respiration.  CARDIOVASCULAR: S1, S2 normal. No murmurs, rubs, or gallops.  ABDOMEN: Soft, nontender, nondistended. Bowel sounds present. No organomegaly or mass.  EXTREMITIES: No cyanosis, clubbing  ++++ edema b/l.    NEUROLOGIC: Cranial nerves II through XII are intact. No focal Motor or sensory deficits b/l.   PSYCHIATRIC:  patient is alert and oriented x 3.  SKIN: No obvious rash, lesion, or ulcer.   LABORATORY PANEL:  CBC  Recent Labs Lab 07/23/16 0656 07/25/16 1505  WBC 6.0  --   HGB 11.4* 11.5*  HCT 34.6*  --   PLT 189  --     Chemistries   Recent Labs Lab 07/25/16 1505  NA 135  K 4.5  CL 94*  CO2 31  GLUCOSE 145*  BUN 35*  CREATININE 4.30*  CALCIUM 9.1   Cardiac Enzymes No results for input(s): TROPONINI in the last 168 hours. RADIOLOGY:  No results found. ASSESSMENT AND PLAN:   [name]  is a 65 y.o. male with a known history of Chronic anemia, severe cardiomyopathy with ejection fraction less than 15%, chronic kidney disease and ESRD on hemodialysis, diabetes, there abuse, hyperlipidemia, hypertension- claims to be compliant with his dialysis in his medications. He started noticing generalized swelling and edema for last few days. Also feeling short of breath so came to emergency room.  * Acute hypoxic resp failure 2/2 acute  on chronic systolic congestive heart failure and Anasarca with end-stage renal disease. EF -20 % Getting hemodialysis , total ultrafiltration since admission 24.5 litres.  * Persistent hypotension Midodrine 10 mg po tid Started and blood pressure is little better    *  FTT  Because of poor quality of life with ESRD, severe systolic heart failure, diabetes, atrial flutter, chronic hypertension, debility, persistent hypotension even with Midodrine  palliative care consult f/u      -  * Diabetes';; resolved hypoglycemia.  sugar is acceptable    *History of atrial flutter versus atrial tachycardia: Continue amiodarone, consider EP consultation, History of mural thrombus is not identified on recent echo,  Warfarin started,INR 1.43 patient noncompliant  ,* severe malnutrition on IV albumin for hypotension and poor volume status due to cardiomyopathy -dietitian to follow   Goals of care Palliative care involved. Daughter -Librarian, academic  Family meeting today with daughter [name]- hcpoa and other significant family members, aware of pts critical situation nd poor prognosiswill d/w pt and considering [facility] in [place] as pts GM lives there and to continue HD , will discuss about their thoughts regarding hospice care, d/w CM Larita Fife, she will talk to SW  Physical Therapy recommended rehabilitation but the patient is not going to rehabilitation because of persistent  hypotension.    CM for d/c planning; [facility] when arrangements are made  Case discussed with Care Management/Social Worker. Management plans discussed with the patient, family and they are in agreement.  CODE STATUS:DNR\ Prognosis is extremely poor. Palliative care arranging family meeting/ hospice evaluation.  DVT Prophylaxis: heparin  TOTAL critical TIME TAKING CARE OF THIS PATIENT: 50 minutes.  >50% time spent on counselling and coordination of care  POSSIBLE D/C IN 2-3 DAYS, DEPENDING ON CLINICAL CONDITION.  Note: This dictation was prepared with Dragon dictation along with smaller phrase technology. Any transcriptional errors that result from this process are unintentional.  Ramonita Lab M.D on 07/25/2016 at 6:10 PM  Between 7am to 6pm - Pager - [phone]  After 6pm go to [email or web address] - password [email or web address] [facility]  [facility]  Office  [phone]  CC: Primary care physician; Dimple Casey, MD

## 2016-07-25 NOTE — Progress Notes (Signed)
post hd vitals 

## 2016-07-25 NOTE — Progress Notes (Signed)
Post hd assessment 

## 2016-07-25 NOTE — Progress Notes (Signed)
Pt HD ended 30 minutes early d/t pt restlessness. Attempting to pull at HD lines and get out of bed. Report to Z. Harris primary RN. Upon leaving HD unit pt has calmed down and is resting. Vss for pt baseline.

## 2016-07-26 LAB — GLUCOSE, CAPILLARY
GLUCOSE-CAPILLARY: 178 mg/dL — AB (ref 65–99)
GLUCOSE-CAPILLARY: 145 mg/dL — AB (ref 65–99)
Glucose-Capillary: 136 mg/dL — ABNORMAL HIGH (ref 65–99)
Glucose-Capillary: 136 mg/dL — ABNORMAL HIGH (ref 65–99)

## 2016-07-26 LAB — PROTIME-INR
INR: 1.75
Prothrombin Time: 20.7 seconds — ABNORMAL HIGH (ref 11.4–15.2)

## 2016-07-26 MED ORDER — WARFARIN SODIUM 5 MG PO TABS
5.0000 mg | ORAL_TABLET | Freq: Once | ORAL | Status: AC
  Administered 2016-07-26: 5 mg via ORAL
  Filled 2016-07-26: qty 1

## 2016-07-26 MED ORDER — LORAZEPAM 0.5 MG PO TABS
0.5000 mg | ORAL_TABLET | Freq: Two times a day (BID) | ORAL | Status: DC | PRN
  Administered 2016-07-27 – 2016-07-29 (×5): 0.5 mg via ORAL
  Filled 2016-07-26 (×5): qty 1

## 2016-07-26 NOTE — Progress Notes (Signed)
[facility]  ROUNDING NOTE   Subjective:    patient is somewhat lethargic today.  His daughter is at bedside He was given Ativan at 2 in the morning No shortness of breath this morning   Objective:  Vital signs in last 24 hours:  Temp:  [97.3 F (36.3 C)-98.1 F (36.7 C)] 97.5 F (36.4 C) (12/17 1136) Pulse Rate:  [83-93] 83 (12/17 1214) Resp:  [16-26] 18 (12/17 1136) BP: (75-99)/(50-69) 98/69 (12/17 1214) SpO2:  [82 %-100 %] 100 % (12/17 1136) Weight:  [97.9 kg (215 lb 14.4 oz)-101.3 kg (223 lb 5.2 oz)] 97.9 kg (215 lb 14.4 oz) (12/17 0342)  Weight change: 1.186 kg (2 lb 9.8 oz) Filed Weights   07/25/16 1050 07/25/16 1340 07/26/16 0342  Weight: 103.2 kg (227 lb 8.2 oz) 101.3 kg (223 lb 5.2 oz) 97.9 kg (215 lb 14.4 oz)    Intake/Output: I/O last 3 completed shifts: In: 0  Out: 2060 [Other:2060]   Intake/Output this shift:  Total I/O In: 237 [P.O.:237] Out: 0   Physical Exam: General: NAD, sitting up in bed  Head: Normocephalic, atraumatic. Moist oral mucosal membranes  Eyes: Anicteric,    Neck: Supple, trachea midline  Lungs:  Mild basilar crackles, [name]   Heart: irregular rhythm  Abdomen:  Soft, nontender, +distended  Extremities:  ++ Generalized edema.  Neurologic: alert  Skin: Dry skin with scratch marks  Access: Right arm AVF    Basic Metabolic Panel:  Recent Labs Lab 07/20/16 0332 07/25/16 1505  NA 132* 135  K 4.4 4.5  CL 93* 94*  CO2 31 31  GLUCOSE 152* 145*  BUN 28* 35*  CREATININE 3.88* 4.30*  CALCIUM 8.8* 9.1  PHOS 2.0* 2.4*    Liver Function Tests:  Recent Labs Lab 07/20/16 0332 07/25/16 1505  ALBUMIN 2.7* 2.8*   No results for input(s): LIPASE, AMYLASE in the last 168 hours. No results for input(s): AMMONIA in the last 168 hours.  CBC:  Recent Labs Lab 07/20/16 0332 07/22/16 1830 07/23/16 0656 07/25/16 1505  WBC 6.1 6.8 6.0  --   HGB 11.1* 11.8* 11.4* 11.5*  HCT 34.0* 36.7* 34.6*  --   MCV 88.9 88.6  87.2  --   PLT 154 188 189  --     Cardiac Enzymes: No results for input(s): CKTOTAL, CKMB, CKMBINDEX, TROPONINI in the last 168 hours.  BNP: Invalid input(s): POCBNP  CBG:  Recent Labs Lab 07/25/16 1441 07/25/16 1654 07/25/16 2105 07/26/16 0742 07/26/16 1138  GLUCAP 141* 123* 108* 136* 136*    Microbiology: Results for orders placed or performed during the hospital encounter of 07/13/16  MRSA PCR Screening     Status: None   Collection Time: 07/13/16 10:11 PM  Result Value Ref Range Status   MRSA by PCR NEGATIVE NEGATIVE Final    Comment:        The GeneXpert MRSA Assay (FDA approved for NASAL specimens only), is one component of a comprehensive MRSA colonization surveillance program. It is not intended to diagnose MRSA infection nor to guide or monitor treatment for MRSA infections.     Coagulation Studies:  Recent Labs  07/24/16 0506 07/24/16 0751 07/25/16 0606 07/26/16 0531  LABPROT 19.6* 19.4* 19.7* 20.7*  INR 1.64 1.62 1.65 1.75    Urinalysis: No results for input(s): COLORURINE, LABSPEC, PHURINE, GLUCOSEU, HGBUR, BILIRUBINUR, KETONESUR, PROTEINUR, UROBILINOGEN, NITRITE, LEUKOCYTESUR in the last 72 hours.  Invalid input(s): APPERANCEUR    Imaging: No results found.   Medications:    .  amiodarone  200 mg Oral Daily  . aspirin EC  81 mg Oral Daily  . atorvastatin  20 mg Oral QHS  . docusate  100 mg Oral Daily  . feeding supplement (ENSURE ENLIVE)  237 mL Oral TID BM  . heparin  5,000 Units Subcutaneous Q8H  . hydrocortisone cream  1 application Topical TID  . insulin aspart  0-9 Units Subcutaneous TID WC  . mouth rinse  15 mL Mouth Rinse BID  . midodrine  10 mg Oral TID WC  . sodium chloride flush  3 mL Intravenous Q12H  . warfarin  5 mg Oral ONCE-1800  . Warfarin - Pharmacist Dosing Inpatient   Does not apply q1800   acetaminophen, albuterol, guaiFENesin-codeine, lactulose, LORazepam  Assessment/ Plan:  [name] is a  65 y.o. black male with ESRD on hemodialysis, atrial fibrillation, mural thrombus, systolic congestive heart failure, cocaine abuse  [facility] [address] [facility]  1. ESRD with volume overload and acute pulmonary edema. Daily dialysis. UF since admission  Almost 30 litres - Overall patient has difficult time tolerating dialysis because he becomes hypotensive whenever volume removal is attempted.  - This is preventing his discharge from hospital - His oral intake is poor  2. Volume overload Chronic Systolic congestive heart failure: - echocardiogram < 20 %  - weight down from admission 127 kg to 98 kg  3. Chronic hypotension:  due to congestive heart failure.  continued on Midodrine 10 TID   4. Anemia of chronic kidney disease: hemoglobin, 11.4 - hold epo  5. Secondary Hyperparathyroidism: phos low at 2.0 - held sevelamer  6. Disposition- Family meeting with Dr Amado CoeGouru and Patient's daughter Patient's daughter feels that he would benefit psychologically from being placed close to his mother. [facility] [place]  She is incapacitated due to inability to walk.   Patient's daughter and family are agreeable to hospice.  We are hoping to get patient into hospice secondary to severe congestive heart failure.  Family is hoping to continue dialysis as long as he can.  Care Management will pursue [facility] [place]  LOS: 13 [name] 12/17/20171:24 PM

## 2016-07-26 NOTE — Progress Notes (Signed)
PT Cancellation Note  Patient Details Name: Yohanan Ferroni MRN: [id number] DOB: 1950-08-11   Cancelled Treatment:    Reason Eval/Treat Not Completed: Fatigue/lethargy limiting ability to participate   Attempted session this am.  Pt in bed with general lethargy.  Difficulty following commands and requests for mobility/exercises.  Session held.   Danielle Dess 07/26/2016, 11:00 AM

## 2016-07-26 NOTE — Progress Notes (Signed)
ANTICOAGULATION CONSULT NOTE - FOLLOW UP   Pharmacy Consult for warfarin Indication: atrial fibrillation and hx of mural thrombus  No Known Allergies  Patient Measurements: Height: 6' (182.9 cm) Weight: 215 lb 14.4 oz (97.9 kg) IBW/kg (Calculated) : 77.6   Vital Signs: Temp: 97.3 F (36.3 C) (12/17 0727) Temp Source: Axillary (12/17 0727) BP: 99/67 (12/17 0727) Pulse Rate: 85 (12/17 0727)  Labs:  Recent Labs  07/24/16 0751 07/25/16 0606 07/25/16 1505 07/26/16 0531  HGB  --   --  11.5*  --   LABPROT 19.4* 19.7*  --  20.7*  INR 1.62 1.65  --  1.75  CREATININE  --   --  4.30*  --     Estimated Creatinine Clearance: 20.2 mL/min (by C-G formula based on SCr of 4.3 mg/dL (H)).   Medical History: Past Medical History:  Diagnosis Date  . Anemia of chronic disease   . Apical mural thrombus   . Arthritis   . Chronic systolic heart failure (HCC)    a) ECHO [facility] 02/2014): EF <15%, multiple apical thrombi, RV mod enlarged, mod MR b. RHC (02/21/14) at [facility]: RA 16, RV 64/9 (15), PA 64/32 (42), PCWP: 24, CI: 1.4  . Diabetes mellitus with complication (HCC)   . Drug use    cocaine  . ESRD on hemodialysis (HCC)    a. MWF  . H/O noncompliance with medical treatment, presenting hazards to health   . HLD (hyperlipidemia)   . HTN (hypertension)   . Intrahepatic cholestasis 02/20/2016  . Mitral regurgitation   . PAF (paroxysmal atrial fibrillation) (HCC)   . Pneumonia       Assessment: 65 yo male restarting warfarin for hx of mural thrombus and PAF. Past records show warfarin dose of 2 mg daily. Pt has been off of warfarin per notes due to compliance issues; planning to d/c to nursing home.   DATE               INR              DOSE  12/13  1.50  3mg  12/14  1.43  3mg   12/15               1.64                 4mg  12/16               1.65                4 mg  12/17               1.75                5 mg   Goal of Therapy:  INR 2-3 Monitor platelets by anticoagulation  protocol: Yes   Plan:  INR remains subtherapeutic. Will give warfarin 5mg  tonight.  Pt also on amiodarone. INR ordered daily. D/C heparin when INR therapeutic.   Demetrius Charity, PharmD Clinical Pharmacist 07/26/2016 9:05 AM

## 2016-07-26 NOTE — Progress Notes (Signed)
[facility] [place] [facility]  PATIENT NAME: [name]    MR#:  [id number]  DATE OF BIRTH:  May 28, 1951    Patient is  Very lethargic today, has received IV Ativan at 2 aM  Unable to perform review of system Patient is lethargic, asking for pain medicine but when I went to see him he is sound asleep and the denying any pain.  REVIEW OF SYSTEMS:   Review of Systems  Unable to perform ROS: Other  Constitutional: Negative for chills, fever and weight loss.  HENT: Negative for ear discharge, ear pain and nosebleeds.   Eyes: Negative for blurred vision, pain and discharge.  Respiratory: Positive for shortness of breath. Negative for sputum production, wheezing and stridor.   Cardiovascular: Positive for orthopnea, leg swelling and PND. Negative for chest pain and palpitations.  Gastrointestinal: Negative for abdominal pain, diarrhea, nausea and vomiting.  Genitourinary: Negative for frequency and urgency.  Musculoskeletal: Negative for back pain and joint pain.  Neurological: Positive for weakness. Negative for sensory change, speech change and focal weakness.  Psychiatric/Behavioral: Negative for depression and hallucinations. The patient is not nervous/anxious.    Tolerating meds:yes Tolerating PT: pending  DRUG ALLERGIES:  No Known Allergies  VITALS:  Blood pressure 99/67, pulse 85, temperature 97.3 F (36.3 C), temperature source Axillary, resp. rate (!) 26, height 6' (1.829 m), weight 97.9 kg (215 lb 14.4 oz), SpO2 97 %.  PHYSICAL EXAMINATION:   Physical Exam  GENERAL:  65 y.o.-year-old patient lying in the bed with no acute distress. Chronically ill, Anasarca, Mostly sleeping. EYES: Pupils equal, round, reactive to light and accommodation. No scleral icterus. Extraocular muscles intact.  HEENT: Head atraumatic, normocephalic. Oropharynx and nasopharynx clear.  NECK:  Supple, no jugular venous distention. No thyroid enlargement, no  tenderness.  LUNGS: decreased breath sounds bilaterally, no wheezing, rales, rhonchi. No use of accessory muscles of respiration.  CARDIOVASCULAR: S1, S2 normal. No murmurs, rubs, or gallops.  ABDOMEN: Soft, nontender, nondistended. Bowel sounds present. No organomegaly or mass.  EXTREMITIES: No cyanosis, clubbing  ++++ edema b/l.    NEUROLOGIC: patient is very lethargic and arousable to verbal commands   PSYCHIATRIC:  patient is very lethargic SKIN: No obvious rash, lesion, or ulcer.   LABORATORY PANEL:  CBC  Recent Labs Lab 07/23/16 0656 07/25/16 1505  WBC 6.0  --   HGB 11.4* 11.5*  HCT 34.6*  --   PLT 189  --     Chemistries   Recent Labs Lab 07/25/16 1505  NA 135  K 4.5  CL 94*  CO2 31  GLUCOSE 145*  BUN 35*  CREATININE 4.30*  CALCIUM 9.1   Cardiac Enzymes No results for input(s): TROPONINI in the last 168 hours. RADIOLOGY:  No results found. ASSESSMENT AND PLAN:   [name]  is a 65 y.o. male with a known history of Chronic anemia, severe cardiomyopathy with ejection fraction less than 15%, chronic kidney disease and ESRD on hemodialysis, diabetes, there abuse, hyperlipidemia, hypertension- claims to be compliant with his dialysis in his medications. He started noticing generalized swelling and edema for last few days. Also feeling short of breath so came to emergency room.  * altered mental status- could be from the Ativan adverse effect Ativan dose reduced 0.5 mg twice a day as needed from 2 mg every 6 hours as needed for anxiety  * FTT  Because of poor quality of life with ESRD, persistent hypotension with midodrine,severe systolic heart failure, diabetes,  atrial flutter,  debility  palliative care consult f/u  As per nurse's report patient is tolerating  Medications but decreased by mouth intake    * Acute hypoxic resp failure 2/2 acute  on chronic systolic congestive heart failure and Anasarca with end-stage renal disease. EF -20 % Getting  hemodialysis , total ultrafiltration since admission 24.5 litres.   * Persistent hypotension Midodrine 10 mg po tid Started and blood pressure is little better      -  * Diabetes';; resolved hypoglycemia.  sugar is acceptable   *History of atrial flutter versus atrial tachycardia: Continue amiodarone, consider EP consultation, History of mural thrombus is not identified on recent echo,  Warfarin started,INR 1.43 patient noncompliant  ,* severe malnutrition on IV albumin for hypotension and poor volume status due to cardiomyopathy -dietitian to follow   Goals of care Palliative care involved. Daughter -Librarian, academic  Family meeting today with daughter [name]- hcpoa  considering [facility] in [place] as pts mom lives there and to continue HD , considering hospice care and continuation of hemodialysis. Our social worker has fax the information to [facility] and waiting for response.Dr. Thedore Mins nephrology is present during the entire conversation .    CM for d/c planning; [facility] when arrangements are made  Case discussed with Care Management/Social Worker. Management plans discussed with the patient, family and they are in agreement.  CODE STATUS:DNR\ Prognosis is extremely poor. Palliative care arranging family meeting/ hospice evaluation.  DVT Prophylaxis: heparin  TOTALTIME TAKING CARE OF THIS PATIENT:35 minutes.  >50% time spent on counselling and coordination of care  POSSIBLE D/C IN 2-3 DAYS, DEPENDING ON CLINICAL CONDITION.  Note: This dictation was prepared with Dragon dictation along with smaller phrase technology. Any transcriptional errors that result from this process are unintentional.  Ramonita Lab M.D on 07/26/2016 at 11:18 AM  Between 7am to 6pm - Pager - [phone]  After 6pm go to [email or web address] - password [email or web address] [facility]  [place] [facility]  Office  [phone]  CC: Primary care physician; [name] Casey,  MD

## 2016-07-26 NOTE — Progress Notes (Signed)
Family Meeting Note  Advance Directive:yes  Today a meeting took place with the Patient's daughter [name],   Patient is unable to participate due HE:RDEYCX capacity Sedated   The following clinical team members were present during this meeting:MD,Dr. Thedore Mins nephrologist  The following were discussed:Patient's diagnosis: , Patient's progosis: very poor prognosis and Goals for treatment: DNR,considering hospice care with continuation of the hemodialysis at [facility] in [place].Will follow up w daughter and palliative care   Additional follow-up to be provided: hospitalist, palliative care and nephrology. Case management and social worker are involved  Time spent during discussion:20 minutes  [name], [name], MD

## 2016-07-27 LAB — GLUCOSE, CAPILLARY
GLUCOSE-CAPILLARY: 124 mg/dL — AB (ref 65–99)
Glucose-Capillary: 139 mg/dL — ABNORMAL HIGH (ref 65–99)
Glucose-Capillary: 145 mg/dL — ABNORMAL HIGH (ref 65–99)

## 2016-07-27 LAB — RENAL FUNCTION PANEL
ANION GAP: 12 (ref 5–15)
Albumin: 2.7 g/dL — ABNORMAL LOW (ref 3.5–5.0)
BUN: 46 mg/dL — ABNORMAL HIGH (ref 6–20)
CALCIUM: 9.1 mg/dL (ref 8.9–10.3)
CHLORIDE: 92 mmol/L — AB (ref 101–111)
CO2: 28 mmol/L (ref 22–32)
Creatinine, Ser: 5.56 mg/dL — ABNORMAL HIGH (ref 0.61–1.24)
GFR, EST AFRICAN AMERICAN: 11 mL/min — AB (ref >60)
GFR, EST NON AFRICAN AMERICAN: 10 mL/min — AB (ref >60)
Glucose, Bld: 145 mg/dL — ABNORMAL HIGH (ref 65–99)
PHOSPHORUS: 3.2 mg/dL (ref 2.5–4.6)
POTASSIUM: 5.3 mmol/L — AB (ref 3.5–5.1)
Sodium: 132 mmol/L — ABNORMAL LOW (ref 135–145)

## 2016-07-27 LAB — PROTIME-INR
INR: 2.19
PROTHROMBIN TIME: 24.7 s — AB (ref 11.4–15.2)

## 2016-07-27 LAB — CBC
HEMATOCRIT: 35.3 % — AB (ref 40.0–52.0)
Hemoglobin: 11.5 g/dL — ABNORMAL LOW (ref 13.0–18.0)
MCH: 28.4 pg (ref 26.0–34.0)
MCHC: 32.6 g/dL (ref 32.0–36.0)
MCV: 87.1 fL (ref 80.0–100.0)
Platelets: 222 10*3/uL (ref 150–440)
RBC: 4.05 MIL/uL — ABNORMAL LOW (ref 4.40–5.90)
RDW: 18.1 % — AB (ref 11.5–14.5)
WBC: 6.9 10*3/uL (ref 3.8–10.6)

## 2016-07-27 MED ORDER — WARFARIN SODIUM 3 MG PO TABS
3.0000 mg | ORAL_TABLET | Freq: Once | ORAL | Status: AC
  Administered 2016-07-27: 3 mg via ORAL
  Filled 2016-07-27: qty 1

## 2016-07-27 NOTE — Progress Notes (Signed)
Post HD assessment  

## 2016-07-27 NOTE — Progress Notes (Signed)
[facility]  ROUNDING NOTE   Subjective:  Patient seen and evaluated during hemodialysis today. He remains a bit lethargic as before. Dialysis appears to be progressing well at the moment.     HEMODIALYSIS FLOWSHEET:  Blood Flow Rate (mL/min): 400 mL/min Arterial Pressure (mmHg): -150 mmHg Venous Pressure (mmHg): 180 mmHg Transmembrane Pressure (mmHg): 40 mmHg Ultrafiltration Rate (mL/min): 810 mL/min Dialysate Flow Rate (mL/min): 800 ml/min Conductivity: Machine : 14.2 Conductivity: Machine : 14.2 Dialysis Fluid Bolus: Normal Saline Bolus Amount (mL): 100 mL Dialysate Change:  (3k 2.5ca) Intra-Hemodialysis Comments: 2031. no change     Objective:  Vital signs in last 24 hours:  Temp:  [97.5 F (36.4 C)-98.6 F (37 C)] 97.6 F (36.4 C) (12/18 0932) Pulse Rate:  [72-88] 88 (12/18 1200) Resp:  [15-24] 21 (12/18 1200) BP: (72-98)/(57-69) 82/65 (12/18 1200) SpO2:  [91 %-100 %] 100 % (12/18 1200) Weight:  [97.7 kg (215 lb 6.2 oz)-103.6 kg (228 lb 8 oz)] 103.6 kg (228 lb 8 oz) (12/18 0932)  Weight change: -5.5 kg (-12 lb 2 oz) Filed Weights   07/26/16 0342 07/27/16 0448 07/27/16 0932  Weight: 97.9 kg (215 lb 14.4 oz) 97.7 kg (215 lb 6.2 oz) 103.6 kg (228 lb 8 oz)    Intake/Output: I/O last 3 completed shifts: In: 237 [P.O.:237] Out: 0    Intake/Output this shift:  Total I/O In: 200 [P.O.:200] Out: -   Physical Exam: General: NAD, laying in bed  Head: Normocephalic, atraumatic. Moist oral mucosal membranes  Eyes: Anicteric  Neck: Supple, trachea midline  Lungs:  Mild basilar crackles, normal effort  Heart: irregular rhythm  Abdomen:  Soft, nontender, +distended  Extremities:  ++ Generalized edema.  Neurologic: Lethargic but arousable  Skin: Dry skin with scratch marks  Access: Right arm AVF    Basic Metabolic Panel:  Recent Labs Lab 07/25/16 1505 07/27/16 0505  NA 135 132*  K 4.5 5.3*  CL 94* 92*  CO2 31 28  GLUCOSE 145* 145*  BUN  35* 46*  CREATININE 4.30* 5.56*  CALCIUM 9.1 9.1  PHOS 2.4* 3.2    Liver Function Tests:  Recent Labs Lab 07/25/16 1505 07/27/16 0505  ALBUMIN 2.8* 2.7*   No results for input(s): LIPASE, AMYLASE in the last 168 hours. No results for input(s): AMMONIA in the last 168 hours.  CBC:  Recent Labs Lab 07/22/16 1830 07/23/16 0656 07/25/16 1505 07/27/16 0505  WBC 6.8 6.0  --  6.9  HGB 11.8* 11.4* 11.5* 11.5*  HCT 36.7* 34.6*  --  35.3*  MCV 88.6 87.2  --  87.1  PLT 188 189  --  222    Cardiac Enzymes: No results for input(s): CKTOTAL, CKMB, CKMBINDEX, TROPONINI in the last 168 hours.  BNP: Invalid input(s): POCBNP  CBG:  Recent Labs Lab 07/26/16 0742 07/26/16 1138 07/26/16 1639 07/26/16 2054 07/27/16 0726  GLUCAP 136* 136* 178* 145* 139*    Microbiology: Results for orders placed or performed during the hospital encounter of 07/13/16  MRSA PCR Screening     Status: None   Collection Time: 07/13/16 10:11 PM  Result Value Ref Range Status   MRSA by PCR NEGATIVE NEGATIVE Final    Comment:        The GeneXpert MRSA Assay (FDA approved for NASAL specimens only), is one component of a comprehensive MRSA colonization surveillance program. It is not intended to diagnose MRSA infection nor to guide or monitor treatment for MRSA infections.     Coagulation Studies:  Recent Labs  07/25/16 0606 07/26/16 0531 07/27/16 0505  LABPROT 19.7* 20.7* 24.7*  INR 1.65 1.75 2.19    Urinalysis: No results for input(s): COLORURINE, LABSPEC, PHURINE, GLUCOSEU, HGBUR, BILIRUBINUR, KETONESUR, PROTEINUR, UROBILINOGEN, NITRITE, LEUKOCYTESUR in the last 72 hours.  Invalid input(s): APPERANCEUR    Imaging: No results found.   Medications:    . amiodarone  200 mg Oral Daily  . aspirin EC  81 mg Oral Daily  . atorvastatin  20 mg Oral QHS  . docusate  100 mg Oral Daily  . feeding supplement (ENSURE ENLIVE)  237 mL Oral TID BM  . heparin  5,000 Units  Subcutaneous Q8H  . hydrocortisone cream  1 application Topical TID  . insulin aspart  0-9 Units Subcutaneous TID WC  . mouth rinse  15 mL Mouth Rinse BID  . midodrine  10 mg Oral TID WC  . warfarin  3 mg Oral ONCE-1800  . Warfarin - Pharmacist Dosing Inpatient   Does not apply q1800   acetaminophen, albuterol, guaiFENesin-codeine, lactulose, LORazepam  Assessment/ Plan:  Mr. Mox Yerby is a 65 y.o. black male with ESRD on hemodialysis, atrial fibrillation, mural thrombus, systolic congestive heart failure, cocaine abuse  MWF [facility]  1. ESRD with volume overload and acute pulmonary edema. Daily dialysis. UF since admission  Almost 30 litres - Overall patient has difficult time tolerating dialysis because he becomes hypotensive whenever volume removal is attempted.  - Patient seen and evaluated during hemodialysis today. - Ultrafiltration has been limited by hypotension at times. UF target today is 2.5 kg.  2. Volume overload Chronic Systolic congestive heart failure: - echocardiogram < 20 %  - Current weight is 13.6 kg.  3. Chronic hypotension:  Blood pressure currently 82/65. Continue midodrine 10 mg by mouth 3 times a day.  4. Anemia of chronic kidney disease: Hemoglobin currently 11.5. Epogen on hold.  5. Secondary Hyperparathyroidism: Phosphorus 3.2 and acceptable. Continue to monitor.  6. Disposition- Family meeting with Dr Amado Coe and Patient's daughter Patient's daughter feels that he would benefit psychologically from being placed close to his mother. She is resident at [facility] in [place]..  She is incapacitated due to inability to walk.   Patient's daughter and family are agreeable to hospice.  We are hoping to get patient into hospice secondary to severe congestive heart failure.  Family is hoping to continue dialysis as long as he can.  Care Management will pursue placement at [facility] in [place] with  hospice  LOS: 14 Lindsie Simar 12/18/201712:06 PM

## 2016-07-27 NOTE — Progress Notes (Signed)
[facility] - [place] [facility]  PATIENT NAME: [name] MaceKenneth [name]    MR#:  [id number]  DATE OF BIRTH:  1951/04/27    Patient is lethargic and still hypotensive. Hemodialysis today  Unable to perform review of system Patient is lethargic, asking for pain medicine but when I went to see him he is sound asleep and the denying any pain.  REVIEW OF SYSTEMS:   Review of Systems  Unable to perform ROS: Other  Constitutional: Negative for chills, fever and weight loss.  HENT: Negative for ear discharge, ear pain and nosebleeds.   Eyes: Negative for blurred vision, pain and discharge.  Respiratory: Positive for shortness of breath. Negative for sputum production, wheezing and stridor.   Cardiovascular: Positive for orthopnea, leg swelling and PND. Negative for chest pain and palpitations.  Gastrointestinal: Negative for abdominal pain, diarrhea, nausea and vomiting.  Genitourinary: Negative for frequency and urgency.  Musculoskeletal: Negative for back pain and joint pain.  Neurological: Positive for weakness. Negative for sensory change, speech change and focal weakness.  Psychiatric/Behavioral: Negative for depression and hallucinations. The patient is not nervous/anxious.    Tolerating meds:yes Tolerating PT: pending  DRUG ALLERGIES:  No Known Allergies  VITALS:  Blood pressure (!) 62/45, pulse 86, temperature 97.4 F (36.3 C), temperature source Oral, resp. rate 19, height 6' (1.829 m), weight 101.1 kg (222 lb 14.2 oz), SpO2 90 %.  PHYSICAL EXAMINATION:   Physical Exam  GENERAL:  65 y.o.-year-old patient lying in the bed with no acute distress. Chronically ill, Anasarca, Mostly sleeping. EYES: Pupils equal, round, reactive to light and accommodation. No scleral icterus. Extraocular muscles intact.  HEENT: Head atraumatic, normocephalic. Oropharynx and nasopharynx clear.  NECK:  Supple, no jugular venous distention. No thyroid enlargement, no tenderness.   LUNGS: decreased breath sounds bilaterally, no wheezing, rales, rhonchi. No use of accessory muscles of respiration.  CARDIOVASCULAR: S1, S2 normal. No murmurs, rubs, or gallops.  ABDOMEN: Soft, nontender, nondistended. Bowel sounds present. No organomegaly or mass.  EXTREMITIES: No cyanosis, clubbing  ++++ edema b/l.    NEUROLOGIC: patient is very lethargic and arousable to verbal commands   PSYCHIATRIC:  patient is very lethargic SKIN: No obvious rash, lesion, or ulcer.   LABORATORY PANEL:  CBC  Recent Labs Lab 07/27/16 0505  WBC 6.9  HGB 11.5*  HCT 35.3*  PLT 222    Chemistries   Recent Labs Lab 07/27/16 0505  NA 132*  K 5.3*  CL 92*  CO2 28  GLUCOSE 145*  BUN 46*  CREATININE 5.56*  CALCIUM 9.1   Cardiac Enzymes No results for input(s): TROPONINI in the last 168 hours. RADIOLOGY:  No results found. ASSESSMENT AND PLAN:   [name]  is a 65 y.o. male with a known history of Chronic anemia, severe cardiomyopathy with ejection fraction less than 15%, chronic kidney disease and ESRD on hemodialysis, diabetes, there abuse, hyperlipidemia, hypertension- claims to be compliant with his dialysis in his medications. He started noticing generalized swelling and edema for last few days. Also feeling short of breath so came to emergency room.  * altered mental status- could be from the Ativan adverse effect Ativan dose reduced 0.5 mg twice a day as needed from 2 mg every 6 hours as needed for anxiety  * FTT  Because of poor quality of life with ESRD, persistent hypotension with midodrine,severe systolic heart failure, diabetes, atrial flutter,  debility  palliative care consult f/u  As per nurse's report patient is tolerating  Medications but decreased by mouth intake    * Acute hypoxic resp failure 2/2 acute  on chronic systolic congestive heart failure and Anasarca with end-stage renal disease. EF -20 % Getting hemodialysis , total ultrafiltration since  admission 24.5 litres.   * Persistent hypotension Midodrine 10 mg po tid Started and blood pressure is little better      -  * Diabetes';; resolved hypoglycemia.  sugar is acceptable   *History of atrial flutter versus atrial tachycardia: Continue amiodarone, consider EP consultation, History of mural thrombus is not identified on recent echo,  Warfarin started,INR 1.43 patient noncompliant  ,* severe malnutrition on IV albumin for hypotension and poor volume status due to cardiomyopathy -dietitian to follow   Goals of care Palliative care involved. Daughter -Delice Lesch  Family meeting 07/26/16 with daughter [name]- hcpoa   considering [facility] in [place] as pts mom lives there, she is incapacitated and unable to walk. Family is hoping to continue hemodialysis as long as he can  , considering hospice care secondary to congestive heart failure with anasarca and continuation of hemodialysis..  Follow-up with case management and social worker  Case discussed with Care Management/Social Worker. Management plans discussed with the patient, family and they are in agreement.  CODE STATUS:DNR\ Prognosis is extremely poor. Palliative care arranging family meeting/ hospice evaluation.  DVT Prophylaxis: heparin  TOTALTIME TAKING CARE OF THIS PATIENT:35 minutes.  >50% time spent on counselling and coordination of care  POSSIBLE D/C IN 2-3 DAYS, DEPENDING ON CLINICAL CONDITION.  Note: This dictation was prepared with Dragon dictation along with smaller phrase technology. Any transcriptional errors that result from this process are unintentional.  Ramonita Lab M.D on 07/27/2016 at 5:35 PM  Between 7am to 6pm - Pager - [phone]  After 6pm go to [email or web address] - password [email or web address] [facility]  [place] [facility]  Office  [phone]  CC: Primary care physician; Dimple Casey, MD

## 2016-07-27 NOTE — Progress Notes (Signed)
PT Cancellation Note  Patient Details Name: [name] MRN: [id number] DOB: February 03, 1951   Cancelled Treatment:    Reason Eval/Treat Not Completed: Medical issues which prohibited therapy.  Labs show potassium elevated to 5.3 today and vitals show hypotensive trend today (last noted BP earlier this afternoon charted 62/45 and in 80's systolic prior to that).  Pt does not appear appropriate for physical therapy at this time.  Will re-attempt PT treatment at a later date/time as medically appropriate.   Irving Burton Correne Lalani 07/27/2016, 4:06 PM Hendricks Limes, PT [phone]

## 2016-07-27 NOTE — Progress Notes (Signed)
ANTICOAGULATION CONSULT NOTE - FOLLOW UP   Pharmacy Consult for warfarin Indication: atrial fibrillation and hx of mural thrombus  No Known Allergies  Patient Measurements: Height: 6' (182.9 cm) Weight: 215 lb 6.2 oz (97.7 kg) IBW/kg (Calculated) : 77.6   Vital Signs: Temp: 97.5 F (36.4 C) (12/18 0736) Temp Source: Oral (12/18 0736) BP: 88/58 (12/18 0736) Pulse Rate: 84 (12/18 0736)  Labs:  Recent Labs  07/25/16 0606 07/25/16 1505 07/26/16 0531 07/27/16 0505  HGB  --  11.5*  --  11.5*  HCT  --   --   --  35.3*  PLT  --   --   --  222  LABPROT 19.7*  --  20.7* 24.7*  INR 1.65  --  1.75 2.19  CREATININE  --  4.30*  --  5.56*    Estimated Creatinine Clearance: 15.6 mL/min (by C-G formula based on SCr of 5.56 mg/dL (H)).   Medical History: Past Medical History:  Diagnosis Date  . Anemia of chronic disease   . Apical mural thrombus   . Arthritis   . Chronic systolic heart failure (HCC)    a) ECHO [facility] 02/2014): EF <15%, multiple apical thrombi, RV mod enlarged, mod MR b. RHC (02/21/14) at [facility]: RA 16, RV 64/9 (15), PA 64/32 (42), PCWP: 24, CI: 1.4  . Diabetes mellitus with complication (HCC)   . Drug use    cocaine  . ESRD on hemodialysis (HCC)    a. MWF  . H/O noncompliance with medical treatment, presenting hazards to health   . HLD (hyperlipidemia)   . HTN (hypertension)   . Intrahepatic cholestasis 02/20/2016  . Mitral regurgitation   . PAF (paroxysmal atrial fibrillation) (HCC)   . Pneumonia       Assessment: 65 yo male restarting warfarin for hx of mural thrombus and PAF. Past records show warfarin dose of 2 mg daily. Pt has been off of warfarin per notes due to compliance issues; planning to d/c to nursing home.   DATE               INR              DOSE  12/13  1.50  3mg  12/14  1.43  3mg   12/15               1.64                 4mg  12/16               1.65                4 mg  12/17               1.75                5 mg   12/18  2.19    Goal of Therapy:  INR 2-3 Monitor platelets by anticoagulation protocol: Yes   Plan:  INR therapeutic. Will give warfarin 3mg  tonight.  Pt also on amiodarone. INR ordered daily.   Delsa Bern, PharmD 07/27/2016 9:12 AM

## 2016-07-27 NOTE — Progress Notes (Signed)
HD initiated without issue via R AVF. Pt currently has no complaints. Feels "sleepy" this am

## 2016-07-27 NOTE — Progress Notes (Signed)
Pre dialysis  

## 2016-07-27 NOTE — Care Management (Signed)
It is documented that attending spoke with patient's daughter over the weekend and appears that family wises for dialysis to continue and to transfer to skilled nursing facility in [place].  Hospice is being consider but if considering to continue with dialysis, this would be in conflict with a hospice plan of care.  Continues with hypotension and on midodrine.  It is documented patient is tolerating his dialysis treatments.  Updated Susann Givens with [facility] [employer] .  Will speak with palliative care today to determine if there is indeed a plan to continue with dialysis treatments.

## 2016-07-27 NOTE — Progress Notes (Signed)
Pre-Dialysis assessment. 

## 2016-07-27 NOTE — Progress Notes (Signed)
HD Completed without issue. UF 2.3L.

## 2016-07-28 LAB — BASIC METABOLIC PANEL
Anion gap: 10 (ref 5–15)
BUN: 34 mg/dL — ABNORMAL HIGH (ref 6–20)
CHLORIDE: 93 mmol/L — AB (ref 101–111)
CO2: 29 mmol/L (ref 22–32)
CREATININE: 4.48 mg/dL — AB (ref 0.61–1.24)
Calcium: 9 mg/dL (ref 8.9–10.3)
GFR calc non Af Amer: 12 mL/min — ABNORMAL LOW (ref >60)
GFR, EST AFRICAN AMERICAN: 14 mL/min — AB (ref >60)
Glucose, Bld: 213 mg/dL — ABNORMAL HIGH (ref 65–99)
Potassium: 5 mmol/L (ref 3.5–5.1)
Sodium: 132 mmol/L — ABNORMAL LOW (ref 135–145)

## 2016-07-28 LAB — PROTIME-INR
INR: 2.5
Prothrombin Time: 27.5 seconds — ABNORMAL HIGH (ref 11.4–15.2)

## 2016-07-28 LAB — GLUCOSE, CAPILLARY
GLUCOSE-CAPILLARY: 209 mg/dL — AB (ref 65–99)
GLUCOSE-CAPILLARY: 173 mg/dL — AB (ref 65–99)
GLUCOSE-CAPILLARY: 185 mg/dL — AB (ref 65–99)
Glucose-Capillary: 151 mg/dL — ABNORMAL HIGH (ref 65–99)
Glucose-Capillary: 174 mg/dL — ABNORMAL HIGH (ref 65–99)

## 2016-07-28 MED ORDER — DOXYCYCLINE HYCLATE 50 MG PO CAPS: 100.0000 mg | ORAL_CAPSULE | Freq: Two times a day (BID) | ORAL | 0 refills | Status: AC

## 2016-07-28 MED ORDER — ENSURE ENLIVE PO LIQD: 237.0000 mL | Freq: Three times a day (TID) | ORAL | 12 refills | Status: AC

## 2016-07-28 MED ORDER — MIDODRINE HCL 10 MG PO TABS: 10.0000 mg | ORAL_TABLET | Freq: Three times a day (TID) | ORAL | 0 refills | Status: AC

## 2016-07-28 MED ORDER — WARFARIN SODIUM 1 MG PO TABS
2.0000 mg | ORAL_TABLET | Freq: Once | ORAL | Status: AC
  Administered 2016-07-28: 2 mg via ORAL
  Filled 2016-07-28: qty 2

## 2016-07-28 MED ORDER — LORAZEPAM 0.5 MG PO TABS: 0.5000 mg | ORAL_TABLET | Freq: Two times a day (BID) | ORAL | 0 refills | Status: AC | PRN

## 2016-07-28 MED ORDER — HYDROXYZINE HCL 50 MG/ML IM SOLN
50.0000 mg | Freq: Four times a day (QID) | INTRAMUSCULAR | Status: DC | PRN
  Administered 2016-07-28: 50 mg via INTRAMUSCULAR
  Filled 2016-07-28 (×2): qty 1

## 2016-07-28 MED ORDER — WARFARIN SODIUM 1 MG PO TABS: 3.0000 mg | ORAL_TABLET | Freq: Once | ORAL | Status: DC

## 2016-07-28 MED ORDER — WARFARIN SODIUM 2 MG PO TABS: 2.0000 mg | ORAL_TABLET | Freq: Every day | ORAL | 0 refills | Status: AC

## 2016-07-28 NOTE — Progress Notes (Addendum)
ANTICOAGULATION CONSULT NOTE - FOLLOW UP   Pharmacy Consult for warfarin Indication: atrial fibrillation and hx of mural thrombus  No Known Allergies  Patient Measurements: Height: 6' (182.9 cm) Weight: 211 lb 4.8 oz (95.8 kg) IBW/kg (Calculated) : 77.6   Vital Signs: Temp: 98.3 F (36.8 C) (12/19 0453) Temp Source: Oral (12/19 0453) BP: 86/42 (12/19 0453) Pulse Rate: 96 (12/19 0453)  Labs:  Recent Labs  07/25/16 1505 07/26/16 0531 07/27/16 0505 07/28/16 0256  HGB 11.5*  --  11.5*  --   HCT  --   --  35.3*  --   PLT  --   --  222  --   LABPROT  --  20.7* 24.7* 27.5*  INR  --  1.75 2.19 2.50  CREATININE 4.30*  --  5.56*  --     Estimated Creatinine Clearance: 15.5 mL/min (by C-G formula based on SCr of 5.56 mg/dL (H)).   Medical History: Past Medical History:  Diagnosis Date  . Anemia of chronic disease   . Apical mural thrombus   . Arthritis   . Chronic systolic heart failure (HCC)    a) ECHO [facility] 02/2014): EF <15%, multiple apical thrombi, RV mod enlarged, mod MR b. RHC (02/21/14) at [facility]: RA 16, RV 64/9 (15), PA 64/32 (42), PCWP: 24, CI: 1.4  . Diabetes mellitus with complication (HCC)   . Drug use    cocaine  . ESRD on hemodialysis (HCC)    a. MWF  . H/O noncompliance with medical treatment, presenting hazards to health   . HLD (hyperlipidemia)   . HTN (hypertension)   . Intrahepatic cholestasis 02/20/2016  . Mitral regurgitation   . PAF (paroxysmal atrial fibrillation) (HCC)   . Pneumonia       Assessment: 65 yo male restarting warfarin for hx of mural thrombus and PAF. Past records show warfarin dose of 2 mg daily. Pt has been off of warfarin per notes due to compliance issues; planning to d/c to nursing home.   DATE               INR              DOSE  12/13  1.50  3mg  12/14  1.43  3mg   12/15               1.64                 4mg  12/16               1.65                4 mg  12/17               1.75                5 mg  12/18  2.19  3mg     12/19  2.50    Goal of Therapy:  INR 2-3 Monitor platelets by anticoagulation protocol: Yes   Plan:  INR therapeutic. Will give warfarin 2mg  tonight.  Pt also on amiodarone. INR ordered daily.   Delsa Bern, PharmD 07/28/2016 7:49 AM

## 2016-07-28 NOTE — Clinical Social Work Note (Addendum)
CSW spoke to [facility], they have not received insurance approval for patient to return back to SNF.  Insurance company is requesting updated clincial notes on patient's progress, CSW awaiting call back from SNF.  CSW spoke to patient's daughter Janus Molder  and explained that because patient has [address], [facility] can not accept patient unless they pay privately.  CSW explained that patient will have to return back to [facility].  CSW explained to patient's daughter that if he needs to apply for long term care Medicaid, she would have to contact DSS to assist with the process.  Patient's daughter was also informed that patient gets dialysis in [place] and if they want to move him to [place], they will have to work with [facility] to make arrangements for transferring to SNF.  CSW also informed patient that [facility] will only pay for patient's SNF as long as he is participating in therapy and making progress, otherwise they will have to look at paying privately for SNF.    CSW received phone call from [facility], they have received authorization for patient to go to SNF and can accept him today.  CSW notified case Production designer, theatre/television/film and physician.    Ervin Knack. Labrenda Lasky, MSW, Theresia Majors [phone]  Mon-Fri 8a-4:30p 07/28/2016 2:14 PM

## 2016-07-28 NOTE — Care Management (Signed)
Patient's insurance is not in network with [employer].  Patient to discharge back to [facility] health care as Monia Pouch has authorized the return.  Patient will require dialysis treatment tomorrow. Informed that patient is able to sit for treatment but may need hoyer lift to transfer.  Nunzio Cory at dialysis center says that patient has had many partial treatments because he arrives late.  Informed that he travels b y the wheelchair [place].  Left voicemail for Doug with [facility] to discuss.  Received call from [facility] stating that unable to provide dialysis due to low blood pressure.  Discussed that this would need to be discussed with physician.  Patient has been hypotensive but has tolerated his treatments.  While CM was trying to contact Dr Cherylann Ratel, received a call from  from [place] at  [facility] wishing to discuss blood pressure.  Informed Tammy Sours that this needed to be discussed with the physician and not the care manager.  Caller hung up. Then spoke with Dr Cherylann Ratel who tried to call the clinic and there was no answer.  There may be a need to refer patient to a different dialysis clinic per Dr Cherylann Ratel if [facility] declines to take patient back due to blood pressure. CM spoke directly with Tammy Sours again and Tammy Sours will speak with Dr Cherylann Ratel. Updated CSW that there again be another issue with the discharge if patient can not return to [facility].

## 2016-07-28 NOTE — Progress Notes (Signed)
Initial Nutrition Assessment  DOCUMENTATION CODES:   Non-severe (moderate) malnutrition in context of chronic illness  INTERVENTION:  1. Continue Ensure Enlive po TID, each supplement provides 350 kcal and 20 grams of protein 2. Magic cup TID with meals, each supplement provides 290 kcal and 9 grams of protein  3. Recommend Enteral Nutrition Support for long term nutrition needs, patient's PO intake and mentation varies heavily  NUTRITION DIAGNOSIS:   Malnutrition related to chronic illness as evidenced by moderate depletion of body fat, moderate depletions of muscle mass, severe fluid accumulation. -ongoing  GOAL:   Patient will meet greater than or equal to 90% of their needs -progressing  MONITOR:   PO intake, Supplement acceptance, Labs, Weight trends  REASON FOR ASSESSMENT:   Malnutrition Screening Tool    ASSESSMENT:   65 yo male admitted with acute on chronic CHF, severe anasarca with ESRD on HD. Pt with CHF with EF 15%, CKD, HTN, HLD  Spoke with [name] this morning. His mental status was improved, but per RN it varies. He ate most of his omelet this morning, he is also consuming strawberry ensure and magic cups on his trays. Weight continues to exhibit fluctuations but he looks much smaller now than when I last saw him on the 11th. Currently 96 kgs - underwent dialysis yesterday. PO intake was improved this morning, will monitor to see if he sustains, at this point nutrition support is still his best option for long term nutrition given the variations in his mentation. Documented meal completion prior to today was 0% at all meals.  Labs and medications reviewed: CBGs 174, Na 132 Colace, Warfarin   Diet Order:  Diet renal/carb modified with fluid restriction Diet-HS Snack? Nothing; Room service appropriate? Yes; Fluid consistency: Thin; Fluid restriction: 1200 mL Fluid  Skin:  Wound (see comment) (stage II coccyx)  Last BM:  12/15  Height:   Ht  Readings from Last 1 Encounters:  07/13/16 6' (1.829 m)    Weight:   Wt Readings from Last 1 Encounters:  07/28/16 211 lb 4.8 oz (95.8 kg)    Ideal Body Weight:     BMI:  Body mass index is 28.66 kg/m.  Estimated Nutritional Needs:   Kcal:  2000-2400kcal/day   Protein:  124-144g/day   Fluid:  or per MD discretion   EDUCATION NEEDS:   No education needs identified at this time  Dionne Ano. [name] Barnette, MS, RD LDN Inpatient Clinical Dietitian Pager [phone]

## 2016-07-28 NOTE — Progress Notes (Signed)
New referral for Palliative services at [facility] following discharge received from CSW Eric Anterhaus. Referral made aware. Thank you. Dayna Barker RN, BSN, [facility] [place], hospital Liaison [phone] c

## 2016-07-28 NOTE — Care Management (Signed)
Patient is medically stable for discharge today.   Patient's daughter Hermine Messick wants patient to go to [facility] in [place] (where his mother is a resident) under rehab plan of treatment for CHF with hospice and continue to receive hemodialysis.  Discussed with Tamilla that receiving dialysis and receiving hospice service are in conflict of the hospice goals.  Also discussed patient can not receive skilled nursing rehab care and receive hospice care at the same time.   Family wants patient to live through Christmas.  He is a DNR.  Meghan with palliative will confirm whether hospice would follow a patient that is receiving dialysis. Patient has not participated with physical therapy treatments. His skilled nursing stay will have to be authorized by his insurance and if patient is not participating with therapy, it is doubtful Monia Pouch Medicare will pay for skilled nursing.  if that is the case, patient facility stay would have to be out of pocket.  Susann Givens is locating a [facility] dialysis center in [place] that may accept patient. Was followed in [place] at [address]

## 2016-07-28 NOTE — Care Management (Signed)
Updated Ceryl Brawner on discharge issues and faxed updated information

## 2016-07-28 NOTE — Progress Notes (Signed)
When changing pt's dressing I noticed that he had moderate amounts of green discharge coming from his ulcer. I called. Dr. Amado Coe to let her know, and she stated she will order a round of antibiotics.

## 2016-07-28 NOTE — Clinical Social Work Note (Addendum)
CSW was informed by case manager that patient's dialysis center has not confirmed if they can take patient back due to his blood pressure being low while tolerating dialysis.  Case manager awaiting call back from dialysis center.  CSW was updated by case manager that patient will have to admitted to a new dialysis center before she is able to discharge back to SNF.  CSW to continue to follow patient's progress throughout discharge planning.  Ervin Knack. Josely Moffat, MSW, Theresia Majors [phone]  Mon-Fri 8a-4:30p 07/28/2016 5:09 PM

## 2016-07-28 NOTE — Progress Notes (Signed)
PT Cancellation Note  Patient Details Name: [name] MRN: [id number] DOB: May 29, 1951   Cancelled Treatment:    Reason Eval/Treat Not Completed: Patient declined, no reason specified.  PT attempted multiple times to encourage pt to participate in physical therapy but pt adamantly refusing PT d/t wanting to sleep instead.  Will re-attempt PT at a later date/time.   Irving Burton Ruari Mudgett 07/28/2016, 11:05 AM Hendricks Limes, PT [phone]

## 2016-07-28 NOTE — Care Management (Signed)
Spoke with Dr Cherylann Ratel.  Dr Criss Alvine - patient's nephrologist will have to verbally decline to allow patient to receive dialysis at [facility] before a new referral can be initiated.  Updated attending and the discharge has been cancelled because patient will require dialysis and there is a lack of accepting center.

## 2016-07-28 NOTE — Discharge Summary (Addendum)
[facility] [place] [facility]   PATIENT NAME: [name]    MR#:  [id number]  DATE OF BIRTH:  20-Nov-1950  DATE OF ADMISSION:  07/13/2016 ADMITTING PHYSICIAN: Altamese Dilling, MD  DATE OF DISCHARGE: 07/28/16  PRIMARY CARE PHYSICIAN: Dimple Casey, MD    ADMISSION DIAGNOSIS:  Acute on chronic systolic congestive heart failure (HCC) [I50.23] Acute respiratory failure with hypoxia (HCC) [J96.01] Other hypervolemia [E87.79]  DISCHARGE DIAGNOSIS:   Acute on chronic systolic congestive heart failure Anasarca Acute respiratory failure with hypoxia Persistent hypotension SECONDARY DIAGNOSIS:   Past Medical History:  Diagnosis Date  . Anemia of chronic disease   . Apical mural thrombus   . Arthritis   . Chronic systolic heart failure (HCC)    a) ECHO [facility] 02/2014): EF <15%, multiple apical thrombi, RV mod enlarged, mod MR b. RHC (02/21/14) at [facility]: RA 16, RV 64/9 (15), PA 64/32 (42), PCWP: 24, CI: 1.4  . Diabetes mellitus with complication (HCC)   . Drug use    cocaine  . ESRD on hemodialysis (HCC)    a. MWF  . H/O noncompliance with medical treatment, presenting hazards to health   . HLD (hyperlipidemia)   . HTN (hypertension)   . Intrahepatic cholestasis 02/20/2016  . Mitral regurgitation   . PAF (paroxysmal atrial fibrillation) (HCC)   . Pneumonia     HOSPITAL COURSE:  KennethJeffriesis a 65 y.o.malewith a known history of Chronic anemia, severe cardiomyopathy with ejection fraction less than 15%, chronic kidney disease and ESRD on hemodialysis, diabetes, there abuse, hyperlipidemia, hypertension- claims to be compliant with his dialysis in his medications. He started noticing generalized swelling and edema for last few days. Also feeling short of breath so came to emergency room.  * altered mental status- could be from the Ativan adverse effect Ativan dose reduced 0.5 mg twice a day as needed from 2 mg every 6 hours as needed  for anxiety  * FTT  Because of poor quality of life with ESRD, persistent hypotension with midodrine,severe systolic heart failure, diabetes, atrial flutter,  debility  palliative care consult f/u  As per nurse's report patient is tolerating  Medications but decreased by mouth intake  *Sacral decub with his ulcers stage II Doxycycline for 10 days Frequent repositioning of the patient and continue wound care  * Acute hypoxic resp failure 2/2 acute  on chronic systolic congestive heart failure and Anasarca with end-stage renal disease. EF -20 % Continue hemodialysis  * Persistent hypotension Midodrine 10 mg po tid Started and blood pressure is little better     -* Diabetes';; resolved hypoglycemia.  sugar is acceptable   *History of atrial flutter versus atrial tachycardia: Continue amiodarone, consider EP consultation, History of mural thrombus is not identified on recent echo,  Warfarin started,INR 2.5  patient noncompliant  ,* severe malnutrition -received IV albumin for hypotension and poor volume status due to cardiomyopathy -dietitianfollowing  Discharge patient to skilled care facility with palliative care and continue hemodialysis   Appreciate case management and social worker assistance  Case discussed with Care Management/Social Worker. Management plans discussed with the patient, family and they are in agreement.  CODE STATUS:DNR\  DISCHARGE CONDITIONS:   fair  CONSULTS OBTAINED:     PROCEDURES HD  DRUG ALLERGIES:  No Known Allergies  DISCHARGE MEDICATIONS:   Current Discharge Medication List    START taking these medications   Details  doxycycline (VIBRAMYCIN) 50 MG capsule Take 2 capsules (100 mg total) by  mouth 2 (two) times daily. Qty: 40 capsule, Refills: 0    !! feeding supplement, ENSURE ENLIVE, (ENSURE ENLIVE) LIQD Take 237 mLs by mouth 2 (two) times daily between meals. Qty: 237 mL, Refills: 12    !! feeding supplement,  ENSURE ENLIVE, (ENSURE ENLIVE) LIQD Take 237 mLs by mouth 3 (three) times daily between meals. Qty: 237 mL, Refills: 12    LORazepam (ATIVAN) 0.5 MG tablet Take 1 tablet (0.5 mg total) by mouth 2 (two) times daily as needed for anxiety (agitation). Qty: 30 tablet, Refills: 0    midodrine (PROAMATINE) 10 MG tablet Take 1 tablet (10 mg total) by mouth 3 (three) times daily with meals. Qty: 90 tablet, Refills: 0    warfarin (COUMADIN) 2 MG tablet Take 1 tablet (2 mg total) by mouth daily. Qty: 5 tablet, Refills: 0     !! - Potential duplicate medications found. Please discuss with provider.    CONTINUE these medications which have CHANGED   Details  insulin aspart (NOVOLOG) 100 UNIT/ML injection Inject 0-9 Units into the skin 3 (three) times daily with meals. Qty: 10 mL, Refills: 11      CONTINUE these medications which have NOT CHANGED   Details  acetaminophen (TYLENOL) 325 MG tablet Take 650 mg by mouth every 6 (six) hours as needed for mild pain or moderate pain.    amiodarone (PACERONE) 200 MG tablet Take 1 tablet (200 mg total) by mouth daily. Qty: 30 tablet, Refills: 6    aspirin EC 81 MG EC tablet Take 1 tablet (81 mg total) by mouth daily.    atorvastatin (LIPITOR) 20 MG tablet Take 20 mg by mouth at bedtime.    guaiFENesin-codeine (ROBITUSSIN AC) 100-10 MG/5ML syrup Take 5 mLs by mouth every 6 (six) hours as needed for cough.    hydrocortisone cream 1 % Apply 1 application topically 3 (three) times daily.    Melatonin 3 MG TABS Take 3 mg by mouth at bedtime.    sevelamer carbonate (RENVELA) 800 MG tablet Take 3 tablets (2,400 mg total) by mouth 3 (three) times daily with meals. Qty: 90 tablet, Refills: 0      STOP taking these medications     insulin NPH Human (HUMULIN N,NOVOLIN N) 100 UNIT/ML injection          DISCHARGE INSTRUCTIONS:   Continue hemodialysis and follow-up with nephrology Dr. Cherylann Ratel Follow-up with primary care physician at the  facility Follow up with the palliative care at the facility in 3-5 days DIET:  Renal dietWith feeding supplements  DISCHARGE CONDITION:  Fair  ACTIVITY:  Activity as tolerated  OXYGEN:  Home Oxygen: No.   Oxygen Delivery: room air  DISCHARGE LOCATION:  nursing home   If you experience worsening of your admission symptoms, develop shortness of breath, life threatening emergency, suicidal or homicidal thoughts you must seek medical attention immediately by calling 911 or calling your MD immediately  if symptoms less severe.  You Must read complete instructions/literature along with all the possible adverse reactions/side effects for all the Medicines you take and that have been prescribed to you. Take any new Medicines after you have completely understood and accpet all the possible adverse reactions/side effects.   Please note  You were cared for by a hospitalist during your hospital stay. If you have any questions about your discharge medications or the care you received while you were in the hospital after you are discharged, you can call the unit and asked to speak with the  hospitalist on call if the hospitalist that took care of you is not available. Once you are discharged, your primary care physician will handle any further medical issues. Please note that NO REFILLS for any discharge medications will be authorized once you are discharged, as it is imperative that you return to your primary care physician (or establish a relationship with a primary care physician if you do not have one) for your aftercare needs so that they can reassess your need for medications and monitor your lab values.     Today  Chief Complaint  Patient presents with  . Testicle Pain   Patient is doing fine today. More alert no complaints  ROS:  CONSTITUTIONAL: Denies fevers, chills. Denies any fatigue, weakness.  EYES: Denies blurry vision, double vision, eye pain. EARS, NOSE, THROAT: Denies  tinnitus, ear pain, hearing loss. RESPIRATORY: Denies cough, wheeze, shortness of breath.  CARDIOVASCULAR: Denies chest pain, palpitations, edema.  GASTROINTESTINAL: Denies nausea, vomiting, diarrhea, abdominal pain. Denies bright red blood per rectum. GENITOURINARY: Denies dysuria, hematuria. ENDOCRINE: Denies nocturia or thyroid problems. HEMATOLOGIC AND LYMPHATIC: Denies easy bruising or bleeding. SKIN: Denies rash or lesion. MUSCULOSKELETAL: Denies pain in neck, back, shoulder, knees, hips or arthritic symptoms.  NEUROLOGIC: Denies paralysis, paresthesias.  PSYCHIATRIC: Denies anxiety or depressive symptoms.   VITAL SIGNS:  Blood pressure (!) 83/60, pulse 87, temperature 98.2 F (36.8 C), temperature source Oral, resp. rate 16, height 6' (1.829 m), weight 95.8 kg (211 lb 4.8 oz), SpO2 97 %.  I/O:    Intake/Output Summary (Last 24 hours) at 07/28/16 1658 Last data filed at 07/28/16 1034  Gross per 24 hour  Intake              597 ml  Output                0 ml  Net              597 ml    PHYSICAL EXAMINATION:  GENERAL:  65 y.o.-year-old patient lying in the bed with no acute distress.  EYES: Pupils equal, round, reactive to light and accommodation. No scleral icterus. Extraocular muscles intact.  HEENT: Head atraumatic, normocephalic. Oropharynx and nasopharynx clear.  NECK:  Supple, no jugular venous distention. No thyroid enlargement, no tenderness.  LUNGS: Normal breath sounds bilaterally, no wheezing, rales,rhonchi or crepitation. No use of accessory muscles of respiration.  CARDIOVASCULAR: S1, S2 normal. No murmurs, rubs, or gallops.  ABDOMEN: Soft, non-tender, non-distended. Bowel sounds present. No organomegaly or mass.  EXTREMITIES: Anasarca with 4+ pedal edema No  cyanosis, or clubbing.  NEUROLOGIC: Cranial nerves II through XII are intact. Muscle strength diminished in all extremities. Sensation intact. Gait not checked.  PSYCHIATRIC: The patient is alert and  oriented x 3.  SKIN: No obvious rash, lesion, or ulcer.   DATA REVIEW:   CBC  Recent Labs Lab 07/27/16 0505  WBC 6.9  HGB 11.5*  HCT 35.3*  PLT 222    Chemistries   Recent Labs Lab 07/28/16 0259  NA 132*  K 5.0  CL 93*  CO2 29  GLUCOSE 213*  BUN 34*  CREATININE 4.48*  CALCIUM 9.0    Cardiac Enzymes No results for input(s): TROPONINI in the last 168 hours.  Microbiology Results  Results for orders placed or performed during the hospital encounter of 07/13/16  MRSA PCR Screening     Status: None   Collection Time: 07/13/16 10:11 PM  Result Value Ref Range Status   MRSA by  PCR NEGATIVE NEGATIVE Final    Comment:        The GeneXpert MRSA Assay (FDA approved for NASAL specimens only), is one component of a comprehensive MRSA colonization surveillance program. It is not intended to diagnose MRSA infection nor to guide or monitor treatment for MRSA infections.     RADIOLOGY:  No results found.  EKG:   Orders placed or performed during the hospital encounter of 07/13/16  . ED EKG  . ED EKG  . EKG 12-Lead  . EKG 12-Lead  . EKG 12-Lead  . EKG 12-Lead      Management plans discussed with the patient, family and they are in agreement.  CODE STATUS:     Code Status Orders        Start     Ordered   07/13/16 2203  Do not attempt resuscitation (DNR)  Continuous    Question Answer Comment  In the event of cardiac or respiratory ARREST Do not call a "code blue"   In the event of cardiac or respiratory ARREST Do not perform Intubation, CPR, defibrillation or ACLS   In the event of cardiac or respiratory ARREST Use medication by any route, position, wound care, and other measures to relive pain and suffering. May use oxygen, suction and manual treatment of airway obstruction as needed for comfort.      07/13/16 2202    Code Status History    Date Active Date Inactive Code Status Order ID Comments User Context   05/22/2016  2:49 AM 05/23/2016   7:07 PM Full Code [id number]  Gwendolyn FillBincy S Varughese, NP ED   05/03/2016 12:21 AM 05/06/2016  9:49 PM Full Code [id number]  Michael LitterNikki Carter, MD Inpatient   02/17/2016  7:38 PM 02/22/2016 11:44 PM DNR [id number]  Renne Muscaaniel L Warden, MD Inpatient   06/22/2014  2:36 PM 06/28/2014  8:24 PM Full Code [id number]  Berdine DanceMichael Shick, MD Inpatient   06/08/2014 12:10 PM 06/22/2014  2:36 PM Full Code [id number]  Alyson ReedyWesam G [name], MD ED      TOTAL TIME TAKING CARE OF THIS PATIENT: 45 minutes.   Note: This dictation was prepared with Dragon dictation along with smaller phrase technology. Any transcriptional errors that result from this process are unintentional.   @[facility] @  on 07/28/2016 at 4:58 PM  Between 7am to 6pm - Pager - [phone]  After 6pm go to [email or web address] - password [email or web address] [facility]  [facility]  Office  [phone]  CC: Primary care physician; Dimple CaseySean A Smith, MD

## 2016-07-28 NOTE — Progress Notes (Addendum)
I have asked pt multiple times to allow me to clean his bottom and perform wound care/change his dressing today. He has told me repeatedly to "leave me alone". I tried to explain the importance of cleaning his bottom and attending to his wound, but he continues to refuse. Will continue to try to encourage him to allow me to clean his wound.   Update 1640: Pt allowed me to complete wound cleansing and dressing change.

## 2016-07-28 NOTE — Progress Notes (Signed)
Notified MD of pt complaining of itching and agitated. Awaiting orders. Will continue to monitor.

## 2016-07-28 NOTE — Progress Notes (Signed)
Daily Progress Note   Patient Name: [name]       Date: 07/28/2016 DOB: November 03, 1950  Age: 65 y.o. MRN#: [id number] Attending Physician: Ramonita Lab, MD Primary Care Physician: Dimple Casey, MD Admit Date: 07/13/2016  Reason for Consultation/Follow-up: Establishing goals of care  Subjective: Patient awake and alert this morning. Follows commands. Denies pain or discomfort. He doesn't remember his daughter at bedside this weekend.   Spoke with daughter, [name] via telephone. She was in town this weekend but was unable to have an in depth conversation with her dad about dialysis because he was drowsy. [name] tells me she would like to continue dialysis for as long as possible and does not want "to loose him at the holidays." She is very hopeful that he will be able to be transferred to [facility] where his mother is located. Explained to [name] that the patient cannot have hospice along with dialysis but that palliative services can follow outpatient to continue conversations and transition him to hospice if he should worsen. She agrees with this plan.   Length of Stay: 15  Current Medications: Scheduled Meds:  . amiodarone  200 mg Oral Daily  . aspirin EC  81 mg Oral Daily  . atorvastatin  20 mg Oral QHS  . docusate  100 mg Oral Daily  . feeding supplement (ENSURE ENLIVE)  237 mL Oral TID BM  . heparin  5,000 Units Subcutaneous Q8H  . hydrocortisone cream  1 application Topical TID  . insulin aspart  0-9 Units Subcutaneous TID WC  . mouth rinse  15 mL Mouth Rinse BID  . midodrine  10 mg Oral TID WC  . warfarin  2 mg Oral ONCE-1800  . Warfarin - Pharmacist Dosing Inpatient   Does not apply q1800    Continuous Infusions:  PRN Meds: acetaminophen, albuterol,  guaiFENesin-codeine, hydrOXYzine, lactulose, LORazepam  Physical Exam  Constitutional: He appears ill.  Cardiovascular: An irregularly irregular rhythm present. Exam reveals distant heart sounds.   Pulmonary/Chest: No accessory muscle usage. He has decreased breath sounds.  Abdominal: Soft. Bowel sounds are decreased.  Neurological: He is alert.  Oriented to person and place. Very forgetful/confused with intermittent agitation.   Skin: Skin is dry.  Psychiatric: He has a normal mood and affect. His speech is normal. He is agitated. Cognition  and memory are impaired.  Nursing note and vitals reviewed.          Vital Signs: BP (!) 77/52 (BP Location: Left Arm)   Pulse 86   Temp 97.4 F (36.3 C) (Oral)   Resp 18   Ht 6' (1.829 m)   Wt 95.8 kg (211 lb 4.8 oz)   SpO2 99%   BMI 28.66 kg/m  SpO2: SpO2: 99 % O2 Device: O2 Device: Nasal Cannula O2 Flow Rate: O2 Flow Rate (L/min): 3 L/min  Intake/output summary:   Intake/Output Summary (Last 24 hours) at 07/28/16 1311 Last data filed at 07/28/16 1034  Gross per 24 hour  Intake              834 ml  Output                0 ml  Net              834 ml   LBM: Last BM Date: 07/27/16 (per pt; last charted BM is 12/16) Baseline Weight: Weight: 127 kg (280 lb) Most recent weight: Weight: 95.8 kg (211 lb 4.8 oz)       Palliative Assessment/Data: PPS 30%   Flowsheet Rows   Flowsheet Row Most Recent Value  Intake Tab  Referral Department  Hospitalist  Unit at Time of Referral  Cardiac/Telemetry Unit  Palliative Care Primary Diagnosis  Cardiac  Date Notified  07/21/16  Palliative Care Type  New Palliative care  Reason for referral  Clarify Goals of Care  Date of Admission  07/13/16  # of days IP prior to Palliative referral  8  Clinical Assessment  Palliative Performance Scale Score  30%  Psychosocial & Spiritual Assessment  Palliative Care Outcomes  Patient/Family meeting held?  Yes  Who was at the meeting?  updated daughter  via telephone  Palliative Care Outcomes  Clarified goals of care, Provided psychosocial or spiritual support, Counseled regarding hospice      Patient Active Problem List   Diagnosis Date Noted  . Palliative care encounter   . Goals of care, counseling/discussion   . Encounter for hospice care discussion   . Pressure injury of skin 07/14/2016  . NICM (nonischemic cardiomyopathy) (HCC) 07/14/2016  . H/O noncompliance with medical treatment, presenting hazards to health 07/14/2016  . Anemia of chronic disease 07/14/2016  . Protein-calorie malnutrition, severe (HCC) 07/14/2016  . Malnutrition of moderate degree 07/14/2016  . Liver dysfunction   . Noncompliance with renal dialysis (HCC)   . ESRD (end stage renal disease) on dialysis (HCC) 05/03/2016  . Generalized weakness 05/02/2016  . Intrahepatic cholestasis 02/20/2016  . Direct hyperbilirubinemia   . Paroxysmal atrial fibrillation (HCC)   . Shortness of breath   . Type 2 diabetes mellitus with diabetic nephropathy, without long-term current use of insulin (HCC)   . End stage renal disease (HCC) 12/25/2014  . Atrial fibrillation [I48.91] 08/09/2014  . Encounter for therapeutic drug monitoring 08/09/2014  . Chronic systolic heart failure (HCC) 07/04/2014  . ESRD (end stage renal disease) (HCC) 07/04/2014  . PAF (paroxysmal atrial fibrillation) (HCC) 07/04/2014  . Controlled diabetes mellitus type II without complication (HCC) 06/22/2014  . Gout 06/22/2014  . ESRD needing dialysis (HCC)   . Pulmonary edema   . Bacteremia associated with intravascular line (HCC)   . Blood poisoning (HCC)   . Acute respiratory failure with hypoxia (HCC)   . AKI (acute kidney injury) (HCC)   . Screen for STD (sexually transmitted disease)   .  Acute respiratory failure (HCC) 06/08/2014  . Cardiogenic shock (HCC) 06/08/2014  . Acute on chronic systolic congestive heart failure (HCC) 06/08/2014  . Acute on chronic renal failure (HCC) 06/08/2014  .  Hyperkalemia 06/08/2014  . Respiratory failure (HCC) 06/08/2014    Palliative Care Assessment & Plan   Patient Profile: 65 y.o. male  with past medical history of ESRD on HD, cardiomyopathy, systolic congestive heart failure with EF <20%, diabetes, anemia of chronic disease, hypertension, hyperlipidemia, mitral regurgitation, and drug abuse admitted on 07/13/2016 with generalized swelling and shortness of breath. In ED, he was hypoxic requiring 3-4L [place]. Anasarca due to end-stage renal disease and acute on chronic congestive heart failure. Patient also with chronic hypotension requiring IV albumin during dialysis and midodrine. Overall poor prognosis. Palliative medicine consultation for goals of care.    Assessment: Acute on chronic systolic congestive heart failure Acute hypoxic respiratory failure Anasarca End-stage renal disease on dialysis Diabetes Atrial flutter Chronic hypotension  Recommendations/Plan:  Family would like to continue dialysis for as long as possible and hopeful for [place].  Palliative services to follow outpatient at SNF. Social work consulted.    Goals of Care and Additional Recommendations:  Limitations on Scope of Treatment: Full Scope Treatment-except DNR/DNI  Code Status: DNR   Code Status Orders        Start     Ordered   07/13/16 2203  Do not attempt resuscitation (DNR)  Continuous    Question Answer Comment  In the event of cardiac or respiratory ARREST Do not call a "code blue"   In the event of cardiac or respiratory ARREST Do not perform Intubation, CPR, defibrillation or ACLS   In the event of cardiac or respiratory ARREST Use medication by any route, position, wound care, and other measures to relive pain and suffering. May use oxygen, suction and manual treatment of airway obstruction as needed for comfort.      07/13/16 2202    Code Status History    Date Active Date Inactive Code Status Order ID  Comments User Context   05/22/2016  2:49 AM 05/23/2016  7:07 PM Full Code [id number]  Gwendolyn FillBincy S Varughese, NP ED   05/03/2016 12:21 AM 05/06/2016  9:49 PM Full Code [id number]  Michael LitterNikki Carter, MD Inpatient   02/17/2016  7:38 PM 02/22/2016 11:44 PM DNR [id number]  Renne Muscaaniel L Warden, MD Inpatient   06/22/2014  2:36 PM 06/28/2014  8:24 PM Full Code [id number]  Berdine DanceMichael Shick, MD Inpatient   06/08/2014 12:10 PM 06/22/2014  2:36 PM Full Code [id number]  Alyson ReedyWesam G [name], MD ED       Prognosis:   Unable to determine  Discharge Planning:  To Be Determined  Care plan was discussed with patient, daughter, RN, Dr. Amado CoeGouru  Thank you for allowing the Palliative Medicine Team to assist in the care of this patient.   Time In: 0945 Time Out: 1020 Total Time 35min Prolonged Time Billed  yes      Greater than 50%  of this time was spent counseling and coordinating care related to the above assessment and plan.  Vennie HomansMegan Jodie Cavey, FNP-C Palliative Medicine Team  Phone: [phone] Fax: [phone]  Please contact Palliative Medicine Team phone at [phone] for questions and concerns.

## 2016-07-28 NOTE — Progress Notes (Signed)
[facility]  ROUNDING NOTE   Subjective:  Patient completed hemodialysis yesterday. Appears to be in slightly better spirits today. Care management working towards disposition. [facility]  Objective:  Vital signs in last 24 hours:  Temp:  [97.4 F (36.3 C)-98.3 F (36.8 C)] 97.4 F (36.3 C) (12/19 1150) Pulse Rate:  [86-96] 86 (12/19 1150) Resp:  [16-18] 18 (12/19 1150) BP: (72-93)/(42-66) 86/59 (12/19 1406) SpO2:  [92 %-99 %] 99 % (12/19 1150) Weight:  [95.8 kg (211 lb 4.8 oz)] 95.8 kg (211 lb 4.8 oz) (12/19 0453)  Weight change: 5.947 kg (13 lb 1.8 oz) Filed Weights   07/27/16 0932 07/27/16 1310 07/28/16 0453  Weight: 103.6 kg (228 lb 8 oz) 101.1 kg (222 lb 14.2 oz) 95.8 kg (211 lb 4.8 oz)    Intake/Output: I/O last 3 completed shifts: In: 914 [P.O.:914] Out: 2377 [Other:2377]   Intake/Output this shift:  Total I/O In: 120 [P.O.:120] Out: -   Physical Exam: General: NAD, laying in bed  Head: Normocephalic, atraumatic. Moist oral mucosal membranes  Eyes: Anicteric  Neck: Supple, trachea midline  Lungs:  Mild basilar crackles, normal effort  Heart: irregular rhythm  Abdomen:  Soft, nontender, +distended  Extremities:  ++ Generalized edema.  Neurologic: Awake, alert, follows commands  Skin: Dry skin with excoriations  Access: Right arm AVF    Basic Metabolic Panel:  Recent Labs Lab 07/25/16 1505 07/27/16 0505 07/28/16 0259  NA 135 132* 132*  K 4.5 5.3* 5.0  CL 94* 92* 93*  CO2 31 28 29   GLUCOSE 145* 145* 213*  BUN 35* 46* 34*  CREATININE 4.30* 5.56* 4.48*  CALCIUM 9.1 9.1 9.0  PHOS 2.4* 3.2  --     Liver Function Tests:  Recent Labs Lab 07/25/16 1505 07/27/16 0505  ALBUMIN 2.8* 2.7*   No results for input(s): LIPASE, AMYLASE in the last 168 hours. No results for input(s): AMMONIA in the last 168 hours.  CBC:  Recent Labs Lab 07/22/16 1830 07/23/16 0656 07/25/16 1505 07/27/16 0505  WBC  6.8 6.0  --  6.9  HGB 11.8* 11.4* 11.5* 11.5*  HCT 36.7* 34.6*  --  35.3*  MCV 88.6 87.2  --  87.1  PLT 188 189  --  222    Cardiac Enzymes: No results for input(s): CKTOTAL, CKMB, CKMBINDEX, TROPONINI in the last 168 hours.  BNP: Invalid input(s): POCBNP  CBG:  Recent Labs Lab 07/27/16 1350 07/27/16 1629 07/27/16 2114 07/28/16 0748 07/28/16 1151  GLUCAP 124* 151* 145* 174* 209*    Microbiology: Results for orders placed or performed during the hospital encounter of 07/13/16  MRSA PCR Screening     Status: None   Collection Time: 07/13/16 10:11 PM  Result Value Ref Range Status   MRSA by PCR NEGATIVE NEGATIVE Final    Comment:        The GeneXpert MRSA Assay (FDA approved for NASAL specimens only), is one component of a comprehensive MRSA colonization surveillance program. It is not intended to diagnose MRSA infection nor to guide or monitor treatment for MRSA infections.     Coagulation Studies:  Recent Labs  07/26/16 0531 07/27/16 0505 07/28/16 0256  LABPROT 20.7* 24.7* 27.5*  INR 1.75 2.19 2.50    Urinalysis: No results for input(s): COLORURINE, LABSPEC, PHURINE, GLUCOSEU, HGBUR, BILIRUBINUR, KETONESUR, PROTEINUR, UROBILINOGEN, NITRITE, LEUKOCYTESUR in the last 72 hours.  Invalid input(s): APPERANCEUR    Imaging: No results found.   Medications:    .  amiodarone  200 mg Oral Daily  . aspirin EC  81 mg Oral Daily  . atorvastatin  20 mg Oral QHS  . docusate  100 mg Oral Daily  . feeding supplement (ENSURE ENLIVE)  237 mL Oral TID BM  . heparin  5,000 Units Subcutaneous Q8H  . hydrocortisone cream  1 application Topical TID  . insulin aspart  0-9 Units Subcutaneous TID WC  . mouth rinse  15 mL Mouth Rinse BID  . midodrine  10 mg Oral TID WC  . warfarin  2 mg Oral ONCE-1800  . Warfarin - Pharmacist Dosing Inpatient   Does not apply q1800   acetaminophen, albuterol, guaiFENesin-codeine, hydrOXYzine, lactulose, LORazepam  Assessment/  Plan:  [name] with ESRD on hemodialysis, atrial fibrillation, mural thrombus, systolic congestive heart failure, cocaine abuse  MWF [facility] [address] [facility]  1. ESRD, presented with volume overload and acute pulmonary edema. Daily dialysis. UF since admission  Almost 30 litres - patient completed hemodialysis yesterday.  No acute indication for dialysis today.  We will plan for hemodialysis again tomorrow.  2. Volume overload Chronic Systolic congestive heart failure: - echocardiogram < 20 %  - overall improved significantly from admission.  Hypotension playing a role in preventing optimal ultrafiltration  3. Chronic hypotension:  Blood pressure 86/59 today. Continue midodrine 10 mg by mouth 3 times a day.  4. Anemia of chronic kidney disease: Hemoglobin 11.5 at last check.  Continue to hold Epogen.  5. Secondary Hyperparathyroidism: recheck phosphorus tomorrow with dialysis.  6. Disposition- Family meeting with Dr Amado Coe and Patient's daughter Difficult situation.  Care management has had some difficulties in placing the patient.  [facility] requesting transfer [facility].   LOS: 15 [name] 12/19/20173:36 PM

## 2016-07-28 NOTE — Progress Notes (Signed)
Pt's BP low at 72/43 (recheck BP is 77/52). He is arousable to voice and irritable. Difficult to determine current orientation level as patient repeatedly asks me "to leave me alone" and "to get out" when I try to talk to him and to ask him questions. Dr. Amado Coe notified. I just gave him his PO midodrine. No other orders received at this time.

## 2016-07-29 LAB — GLUCOSE, CAPILLARY
GLUCOSE-CAPILLARY: 110 mg/dL — AB (ref 65–99)
Glucose-Capillary: 127 mg/dL — ABNORMAL HIGH (ref 65–99)
Glucose-Capillary: 117 mg/dL — ABNORMAL HIGH (ref 65–99)

## 2016-07-29 LAB — PROTIME-INR
INR: 2.87
PROTHROMBIN TIME: 30.7 s — AB (ref 11.4–15.2)

## 2016-07-29 MED ORDER — WARFARIN SODIUM 1 MG PO TABS: 1.0000 mg | ORAL_TABLET | Freq: Once | ORAL | Status: DC

## 2016-07-29 MED ORDER — WARFARIN 0.5 MG HALF TABLET
0.5000 mg | ORAL_TABLET | Freq: Once | ORAL | Status: AC
  Administered 2016-07-29: 0.5 mg via ORAL
  Filled 2016-07-29: qty 1

## 2016-07-29 NOTE — Progress Notes (Signed)
Daily Progress Note   Patient Name: [name]       Date: 07/29/2016 DOB: [birth year]  Age: 65 y.o. MRN#: [id number] Attending Physician: Ramonita LabAruna Gouru, MD Primary Care Physician: Dimple CaseySean A Smith, MD Admit Date: 07/13/2016  Reason for Consultation/Follow-up: Establishing goals of care  Subjective: Patient in dialysis this afternoon.   Spoke with daughter, [name] via telephone. She is aware of a challenging disposition with his dialysis center accepting him with low blood pressure. She stands firm on her decision to continue dialysis for as long as possible. She asked if his blood pressure would drop, would they bring him back to the hospital? Discussed that this would be the case and the likelihood of this continuing to be a cycle of rehospitalization with him not tolerating dialysis like he once did. Encouraged her again to consider hospice services if he continues to decline and require re-hospitalization. She agrees with palliative to follow outpatient and is appreciative of support from palliative.   Length of Stay: 16  Current Medications: Scheduled Meds:  . amiodarone  200 mg Oral Daily  . aspirin EC  81 mg Oral Daily  . atorvastatin  20 mg Oral QHS  . docusate  100 mg Oral Daily  . feeding supplement (ENSURE ENLIVE)  237 mL Oral TID BM  . heparin  5,000 Units Subcutaneous Q8H  . hydrocortisone cream  1 application Topical TID  . insulin aspart  0-9 Units Subcutaneous TID WC  . mouth rinse  15 mL Mouth Rinse BID  . midodrine  10 mg Oral TID WC  . warfarin  1 mg Oral ONCE-1800  . Warfarin - Pharmacist Dosing Inpatient   Does not apply q1800    Continuous Infusions:  PRN Meds: acetaminophen, albuterol, guaiFENesin-codeine, hydrOXYzine, lactulose, LORazepam  Physical Exam    Constitutional: He appears ill.  Cardiovascular: An irregularly irregular rhythm present. Exam reveals distant heart sounds.   Pulmonary/Chest: No accessory muscle usage. He has decreased breath sounds.  Abdominal: Soft. Bowel sounds are decreased.  Neurological: He is alert.  Oriented to person and place. Very forgetful/confused with intermittent agitation.   Skin: Skin is dry.  Psychiatric: He has a normal mood and affect. His speech is normal. He is agitated. Cognition and memory are impaired.  Nursing note and vitals reviewed.  Vital Signs: BP 91/75 (BP Location: Left Arm)   Pulse 92   Temp 97.8 F (36.6 C) (Oral)   Resp 19   Ht 6' (1.829 m)   Wt 100.3 kg (221 lb 1.9 oz)   SpO2 100%   BMI 29.99 kg/m  SpO2: SpO2: 100 % O2 Device: O2 Device: Nasal Cannula O2 Flow Rate: O2 Flow Rate (L/min): 3 L/min  Intake/output summary:   Intake/Output Summary (Last 24 hours) at 07/29/16 1232 Last data filed at 07/29/16 1225  Gross per 24 hour  Intake                0 ml  Output             1500 ml  Net            -1500 ml   LBM: Last BM Date: 07/28/16 Baseline Weight: Weight: 127 kg (280 lb) Most recent weight: Weight: 100.3 kg (221 lb 1.9 oz)       Palliative Assessment/Data: PPS 30%   Flowsheet Rows   Flowsheet Row Most Recent Value  Intake Tab  Referral Department  Hospitalist  Unit at Time of Referral  Cardiac/Telemetry Unit  Palliative Care Primary Diagnosis  Cardiac  Date Notified  07/21/16  Palliative Care Type  New Palliative care  Reason for referral  Clarify Goals of Care  Date of Admission  07/13/16  # of days IP prior to Palliative referral  8  Clinical Assessment  Palliative Performance Scale Score  30%  Psychosocial & Spiritual Assessment  Palliative Care Outcomes  Patient/Family meeting held?  Yes  Who was at the meeting?  updated daughter via telephone  Palliative Care Outcomes  Clarified goals of care, Provided psychosocial or spiritual  support, Counseled regarding hospice      Patient Active Problem List   Diagnosis Date Noted  . Palliative care encounter   . Goals of care, counseling/discussion   . Encounter for hospice care discussion   . Pressure injury of skin 07/14/2016  . NICM (nonischemic cardiomyopathy) (HCC) 07/14/2016  . H/O noncompliance with medical treatment, presenting hazards to health 07/14/2016  . Anemia of chronic disease 07/14/2016  . Protein-calorie malnutrition, severe (HCC) 07/14/2016  . Malnutrition of moderate degree 07/14/2016  . Liver dysfunction   . Noncompliance with renal dialysis (HCC)   . ESRD (end stage renal disease) on dialysis (HCC) 05/03/2016  . Generalized weakness 05/02/2016  . Intrahepatic cholestasis 02/20/2016  . Direct hyperbilirubinemia   . Paroxysmal atrial fibrillation (HCC)   . Shortness of breath   . Type 2 diabetes mellitus with diabetic nephropathy, without long-term current use of insulin (HCC)   . End stage renal disease (HCC) 12/25/2014  . Atrial fibrillation [I48.91] 08/09/2014  . Encounter for therapeutic drug monitoring 08/09/2014  . Chronic systolic heart failure (HCC) 07/04/2014  . ESRD (end stage renal disease) (HCC) 07/04/2014  . PAF (paroxysmal atrial fibrillation) (HCC) 07/04/2014  . Controlled diabetes mellitus type II without complication (HCC) 06/22/2014  . Gout 06/22/2014  . ESRD needing dialysis (HCC)   . Pulmonary edema   . Bacteremia associated with intravascular line (HCC)   . Blood poisoning (HCC)   . Acute respiratory failure with hypoxia (HCC)   . AKI (acute kidney injury) (HCC)   . Screen for STD (sexually transmitted disease)   . Acute respiratory failure (HCC) 06/08/2014  . Cardiogenic shock (HCC) 06/08/2014  . Acute on chronic systolic congestive heart failure (HCC) 06/08/2014  . Acute on chronic renal  failure (HCC) 06/08/2014  . Hyperkalemia 06/08/2014  . Respiratory failure (HCC) 06/08/2014    Palliative Care Assessment &  Plan   Patient Profile: 65 y.o. male  with past medical history of ESRD on HD, cardiomyopathy, systolic congestive heart failure with EF <20%, diabetes, anemia of chronic disease, hypertension, hyperlipidemia, mitral regurgitation, and drug abuse admitted on 07/13/2016 with generalized swelling and shortness of breath. In ED, he was hypoxic requiring 3-4L [place]. Anasarca due to end-stage renal disease and acute on chronic congestive heart failure. Patient also with chronic hypotension requiring IV albumin during dialysis and midodrine. Overall poor prognosis. Palliative medicine consultation for goals of care.    Assessment: Acute on chronic systolic congestive heart failure Acute hypoxic respiratory failure Anasarca End-stage renal disease on dialysis Diabetes Atrial flutter Chronic hypotension  Recommendations/Plan:  Family would like to continue dialysis for as long as possible.  Palliative services to follow outpatient at SNF. Social work consulted.    Goals of Care and Additional Recommendations:  Limitations on Scope of Treatment: Full Scope Treatment-except DNR/DNI  Code Status: DNR   Code Status Orders        Start     Ordered   07/13/16 2203  Do not attempt resuscitation (DNR)  Continuous    Question Answer Comment  In the event of cardiac or respiratory ARREST Do not call a "code blue"   In the event of cardiac or respiratory ARREST Do not perform Intubation, CPR, defibrillation or ACLS   In the event of cardiac or respiratory ARREST Use medication by any route, position, wound care, and other measures to relive pain and suffering. May use oxygen, suction and manual treatment of airway obstruction as needed for comfort.      07/13/16 2202    Code Status History    Date Active Date Inactive Code Status Order ID Comments User Context   05/22/2016  2:49 AM 05/23/2016  7:07 PM Full Code [id number]  Gwendolyn Fill, NP ED   05/03/2016 12:21 AM 05/06/2016  9:49 PM Full Code  [id number]  Michael Litter, MD Inpatient   02/17/2016  7:38 PM 02/22/2016 11:44 PM DNR [id number]  Renne Musca, MD Inpatient   06/22/2014  2:36 PM 06/28/2014  8:24 PM Full Code [id number]  Berdine Dance, MD Inpatient   06/08/2014 12:10 PM 06/22/2014  2:36 PM Full Code [id number]  Alyson Reedy, MD ED       Prognosis:   Unable to determine  Discharge Planning:  To Be Determined  Care plan was discussed with patient, daughter, RN, Dr. Amado Coe  Thank you for allowing the Palliative Medicine Team to assist in the care of this patient.   Time In: 1155 Time Out: 1230 Total Time Prolonged Time Billed  yes      Greater than 50%  of this time was spent counseling and coordinating care related to the above assessment and plan.  Vennie Homans, FNP-C Palliative Medicine Team  Phone: [phone] Fax: [phone]  Please contact Palliative Medicine Team phone at [phone] for questions and concerns.

## 2016-07-29 NOTE — Progress Notes (Signed)
Pt back from dialysis while this RN was at lunch. VS, weight entered by Asher Muir NT. Patient refusing all meds and finger stick - convinced him to at least take his midodrine for his BP. Bed alarm on. Will continue to monitor.

## 2016-07-29 NOTE — Progress Notes (Signed)
Pre hd assessment  

## 2016-07-29 NOTE — Progress Notes (Signed)
PRE HD INFO 

## 2016-07-29 NOTE — Progress Notes (Signed)
   07/27/16 0941 07/27/16 1000 07/27/16 1030  Vital Signs  Pulse Rate 87 86 87  Pulse Rate Source --  --  Monitor  Resp 15 16 20   BP 90/67 (!) 84/66 (!) 73/57  BP Location Left Arm Left Arm Left Arm  BP Method Automatic Automatic Automatic  Patient Position (if appropriate) Lying Lying Lying

## 2016-07-29 NOTE — Progress Notes (Signed)
Post hd vitals 

## 2016-07-29 NOTE — Progress Notes (Signed)
Start of hd 

## 2016-07-29 NOTE — Progress Notes (Signed)
   07/27/16 1230 07/27/16 1259  During Hemodialysis Assessment  Blood Flow Rate (mL/min) 400 mL/min 400 mL/min  Arterial Pressure (mmHg) -150 mmHg -150 mmHg  Venous Pressure (mmHg) 180 mmHg 180 mmHg  Transmembrane Pressure (mmHg) 40 mmHg 40 mmHg  Ultrafiltration Rate (mL/min) 810 mL/min 810 mL/min  Dialysate Flow Rate (mL/min) 800 ml/min 800 ml/min  Conductivity: Machine  14.2 14  HD Safety Checks Performed Yes Yes  Intra-Hemodialysis Comments 2477. Resting 2792

## 2016-07-29 NOTE — Progress Notes (Signed)
   07/27/16 1100 07/27/16 1130 07/27/16 1200  Vital Signs  Temp --  --  --   Temp Source --  --  --   Pulse Rate 87 87 88  Pulse Rate Source --  --  --   Resp 19 20 (!) 21  BP (!) 73/58 (!) 79/57 (!) 82/65  BP Location Left Arm Left Arm Left Arm  BP Method Automatic Automatic Automatic  Patient Position (if appropriate) Lying Lying Lying     07/27/16 1230 07/27/16 1259 07/27/16 1310  Vital Signs  Temp --  97.4 F (36.3 C) --   Temp Source --  Oral --   Pulse Rate 88 89 88  Pulse Rate Source Monitor Monitor Monitor  Resp 12 13 14   BP (!) 78/60 (!) 82/61 (!) 80/64  BP Location Left Arm Left Arm Left Arm  BP Method Automatic Automatic Automatic  Patient Position (if appropriate) Lying Lying Lying

## 2016-07-29 NOTE — Care Management (Addendum)
Manually faxed new referral information to [facility] since [facility] declines to accept patient back at clinic. Updated clinical social work that it could be possible to patient to be reassigned to a new clinic within 24 hours.  Again spoke with nursing during progression of the need to document that patient is sitting for dialysis

## 2016-07-29 NOTE — Care Management (Signed)
Informed by [facility] dialysis center that the criteria for systolic blood pressure is 100 systolic.  the lowest that patient's nephrologist would consider would be 90 systolic.  If patient were to present to the dialysis center for treatment and his systolic pressure is 90 or below, he would be sent directly to the ED. Paged nephrology to discuss.  Patient daughter is insisting on continuing dialysis and patient is not able to say for himself.

## 2016-07-29 NOTE — Care Management (Signed)
Results for Brandau, JURIAH (MRN [id number]) as of 07/29/2016 14:36  Ref. Range 06/13/2014 04:49 06/25/2014 07:40 02/17/2016 16:30 05/22/2016 04:47 07/14/2016 15:00  Hep A Ab, IgM Latest Ref Range: NON REACTIVE  NON REACTIVE      Hepatitis B Surface Ag Latest Ref Range: Negative  NEGATIVE  Negative Negative Negative  Hep B S Ab Unknown  NEGATIVE   Reactive  Hep B Core Ab, Tot Latest Ref Range: Negative   NON REACTIVE   Negative  Hep B Core Ab, IgM Latest Ref Range: NON REACTIVE  NON REACTIVE      Hepatitis B-Post Latest Ref Range: Immunity>9.9 mIU/mL    >1,000.0   HCV Ab Latest Ref Range: NEGATIVE  NEGATIVE

## 2016-07-29 NOTE — Progress Notes (Signed)
Patient not wanting to keep O2 in place. Sats dropped on room air at rest. Educated patient, agrees to try to keep it in. Will continue to monitor closely.    SATURATION QUALIFICATIONS: (This note is used to comply with regulatory documentation for home oxygen)  Patient Saturations on Room Air at Rest = 85%  Patient Saturations on Room Air while Ambulating = n/a%  Patient Saturations on n/a Liters of oxygen while Ambulating = n/a %  Please briefly explain why patient needs home oxygen: desats on room air at rest

## 2016-07-29 NOTE — Progress Notes (Signed)
Orders to swab sacral wound for culture. Patient cooperated and let this RN swab and change dressing. Scant to no drainage, yellow base. Dr. Amado Coe aware, order for [facility] consult.

## 2016-07-29 NOTE — Progress Notes (Signed)
HD 12.20.2017    07/29/16 0950 07/29/16 1015 07/29/16 1030  During Hemodialysis Assessment  Arterial Pressure (mmHg) -170 mmHg -170 mmHg -180 mmHg  Venous Pressure (mmHg) 220 mmHg 190 mmHg 260 mmHg  Transmembrane Pressure (mmHg) 40 mmHg 40 mmHg 40 mmHg  Ultrafiltration Rate (mL/min) 570 mL/min 570 mL/min 570 mL/min  Dialysate Flow Rate (mL/min) 800 ml/min 800 ml/min 800 ml/min  Conductivity: Machine  13.4 14.8 14.3  HD Safety Checks Performed Yes Yes Yes  Dialysis Fluid Bolus --  --  --   Bolus Amount (mL) --  --  --   Dialysate Change --  --  --   Intra-Hemodialysis Comments 522. pt alert. 811. Resting 912. Resting  Fistula / Graft Right Forearm Arteriovenous fistula  Placement Date/Time: 06/27/14 0930   Placed prior to admission: No  Orientation: Right  Access Location: Forearm  Access Type: (c) Arteriovenous fistula  Site Condition --  --  --   Fistula / Graft Assessment --  --  --   Status --  --  --   Drainage Description --  --  --      07/29/16 1100 07/29/16 1130 07/29/16 1200  During Hemodialysis Assessment  Arterial Pressure (mmHg) -180 mmHg -180 mmHg -180 mmHg  Venous Pressure (mmHg) 230 mmHg 210 mmHg 210 mmHg  Transmembrane Pressure (mmHg) 40 mmHg 40 mmHg 40 mmHg  Ultrafiltration Rate (mL/min) 570 mL/min 570 mL/min 570 mL/min  Dialysate Flow Rate (mL/min) 800 ml/min 800 ml/min 800 ml/min  Conductivity: Machine  14.3 15.3 13.6  HD Safety Checks Performed Yes Yes Yes  Dialysis Fluid Bolus --  --  --   Bolus Amount (mL) --  --  --   Dialysate Change --  --  --   Intra-Hemodialysis Comments 1246. Resting 1479. alert 1745. alert  Fistula / Graft Right Forearm Arteriovenous fistula  Placement Date/Time: 06/27/14 0930   Placed prior to admission: No  Orientation: Right  Access Location: Forearm  Access Type: (c) Arteriovenous fistula  Site Condition --  --  --   Fistula / Graft Assessment --  --  --   Status --  --  --   Drainage Description --  --  --      07/29/16  1225  During Hemodialysis Assessment  Arterial Pressure (mmHg) --   Venous Pressure (mmHg) --   Transmembrane Pressure (mmHg) --   Ultrafiltration Rate (mL/min) --   Dialysate Flow Rate (mL/min) --   Conductivity: Machine  --   HD Safety Checks Performed --   Dialysis Fluid Bolus Normal Saline  Bolus Amount (mL) 250 mL  Dialysate Change 2K  Intra-Hemodialysis Comments 2000. tx ended  Fistula / Graft Right Forearm Arteriovenous fistula  Placement Date/Time: 06/27/14 0930   Placed prior to admission: No  Orientation: Right  Access Location: Forearm  Access Type: (c) Arteriovenous fistula  Site Condition No complications  Fistula / Graft Assessment Present;Thrill;Bruit  Status Deaccessed;Flushed  Drainage Description None

## 2016-07-29 NOTE — Progress Notes (Signed)
   07/27/16 1259 07/27/16 1310 07/27/16 1312  Vital Signs  Temp 97.4 F (36.3 C) --  --   Temp Source Oral --  --   Pulse Rate 89 88 87  Pulse Rate Source Monitor Monitor --   Resp 13 14 13   BP (!) 82/61 (!) 80/64 (!) 82/60  BP Location Left Arm Left Arm Left Arm  BP Method Automatic Automatic Automatic     07/27/16 1350 07/27/16 1943  Vital Signs  Temp --  98.2 F (36.8 C)  Temp Source --  Oral  Pulse Rate 86 90  Pulse Rate Source Monitor --   Resp 19 16  BP (!) 62/45 (!) 85/55  BP Location Left Arm Left Arm  BP Method Automatic Automatic

## 2016-07-29 NOTE — Progress Notes (Signed)
[facility]  ROUNDING NOTE   Subjective:  Patient seen and evaluated during hemodialysis. Overall he appears to be tolerating well. Blood pressure currently 87/55. The patient's outpatient dialysis center has some concerns about him coming back to the facility given low blood pressure.    HEMODIALYSIS FLOWSHEET:  Blood Flow Rate (mL/min): 400 mL/min Arterial Pressure (mmHg): -180 mmHg Venous Pressure (mmHg): 210 mmHg Transmembrane Pressure (mmHg): 40 mmHg Ultrafiltration Rate (mL/min): 570 mL/min Dialysate Flow Rate (mL/min): 800 ml/min Conductivity: Machine : 15.3 Conductivity: Machine : 15.3 Dialysis Fluid Bolus: Normal Saline Bolus Amount (mL): 250 mL (prime) Dialysate Change: 2K Intra-Hemodialysis Comments: 1479. alert   Objective:  Vital signs in last 24 hours:  Temp:  [97.9 F (36.6 C)-98.3 F (36.8 C)] 97.9 F (36.6 C) (12/20 0840) Pulse Rate:  [82-95] 95 (12/20 1130) Resp:  [14-25] 18 (12/20 1130) BP: (72-99)/(49-73) 87/55 (12/20 1130) SpO2:  [91 %-100 %] 96 % (12/20 1130) Weight:  [95.8 kg (211 lb 1.6 oz)-100.3 kg (221 lb 1.9 oz)] 100.3 kg (221 lb 1.9 oz) (12/20 0840)  Weight change: -7.893 kg (-17 lb 6.4 oz) Filed Weights   07/28/16 0453 07/29/16 0410 07/29/16 0840  Weight: 95.8 kg (211 lb 4.8 oz) 95.8 kg (211 lb 1.6 oz) 100.3 kg (221 lb 1.9 oz)    Intake/Output: I/O last 3 completed shifts: In: 357 [P.O.:357] Out: 0    Intake/Output this shift:  No intake/output data recorded.  Physical Exam: General: NAD, laying in bed  Head: Normocephalic, atraumatic. Moist oral mucosal membranes  Eyes: Anicteric  Neck: Supple, trachea midline  Lungs:  Mild basilar crackles, normal effort  Heart: irregular rhythm  Abdomen:  Soft, nontender, +distended  Extremities:  + Generalized edema.  Neurologic: Awake, alert, follows commands  Skin: Dry skin with excoriations  Access: Right arm AVF    Basic Metabolic Panel:  Recent Labs Lab  07/25/16 1505 07/27/16 0505 07/28/16 0259  NA 135 132* 132*  K 4.5 5.3* 5.0  CL 94* 92* 93*  CO2 31 28 29   GLUCOSE 145* 145* 213*  BUN 35* 46* 34*  CREATININE 4.30* 5.56* 4.48*  CALCIUM 9.1 9.1 9.0  PHOS 2.4* 3.2  --     Liver Function Tests:  Recent Labs Lab 07/25/16 1505 07/27/16 0505  ALBUMIN 2.8* 2.7*   No results for input(s): LIPASE, AMYLASE in the last 168 hours. No results for input(s): AMMONIA in the last 168 hours.  CBC:  Recent Labs Lab 07/22/16 1830 07/23/16 0656 07/25/16 1505 07/27/16 0505  WBC 6.8 6.0  --  6.9  HGB 11.8* 11.4* 11.5* 11.5*  HCT 36.7* 34.6*  --  35.3*  MCV 88.6 87.2  --  87.1  PLT 188 189  --  222    Cardiac Enzymes: No results for input(s): CKTOTAL, CKMB, CKMBINDEX, TROPONINI in the last 168 hours.  BNP: Invalid input(s): POCBNP  CBG:  Recent Labs Lab 07/28/16 0748 07/28/16 1151 07/28/16 1659 07/28/16 2057 07/29/16 0756  GLUCAP 174* 209* 173* 185* 127*    Microbiology: Results for orders placed or performed during the hospital encounter of 07/13/16  MRSA PCR Screening     Status: None   Collection Time: 07/13/16 10:11 PM  Result Value Ref Range Status   MRSA by PCR NEGATIVE NEGATIVE Final    Comment:        The GeneXpert MRSA Assay (FDA approved for NASAL specimens only), is one component of a comprehensive MRSA colonization surveillance program. It is not intended to diagnose  MRSA infection nor to guide or monitor treatment for MRSA infections.     Coagulation Studies:  Recent Labs  07/27/16 0505 07/28/16 0256 07/29/16 0336  LABPROT 24.7* 27.5* 30.7*  INR 2.19 2.50 2.87    Urinalysis: No results for input(s): COLORURINE, LABSPEC, PHURINE, GLUCOSEU, HGBUR, BILIRUBINUR, KETONESUR, PROTEINUR, UROBILINOGEN, NITRITE, LEUKOCYTESUR in the last 72 hours.  Invalid input(s): APPERANCEUR    Imaging: No results found.   Medications:    . amiodarone  200 mg Oral Daily  . aspirin EC  81 mg Oral  Daily  . atorvastatin  20 mg Oral QHS  . docusate  100 mg Oral Daily  . feeding supplement (ENSURE ENLIVE)  237 mL Oral TID BM  . heparin  5,000 Units Subcutaneous Q8H  . hydrocortisone cream  1 application Topical TID  . insulin aspart  0-9 Units Subcutaneous TID WC  . mouth rinse  15 mL Mouth Rinse BID  . midodrine  10 mg Oral TID WC  . warfarin  1 mg Oral ONCE-1800  . Warfarin - Pharmacist Dosing Inpatient   Does not apply q1800   acetaminophen, albuterol, guaiFENesin-codeine, hydrOXYzine, lactulose, LORazepam  Assessment/ Plan:  [name] with ESRD on hemodialysis, atrial fibrillation, mural thrombus, systolic congestive heart failure, cocaine abuse  MWF [facility] [address] [facility]  1. ESRD, presented with volume overload and acute pulmonary edema. Daily dialysis. UF since admission  Almost 30 litres -Patient seen and evaluated during hemodialysis today. At times we continue be limited with ultrafiltration given relative hypotension. We will continue to monitor his blood pressure during dialysis closely. He will also continue on midodrine. Care management has called the patient's outpatient dialysis center and currently they have some concerns with his underlying hypotension and the ability to perform ultrafiltration. The central discuss this further with his outpatient nephrologist.  2. Volume overload Chronic Systolic congestive heart failure: - echocardiogram < 20 %  - overall improved significantly from admission.  Hypotension playing a role in preventing optimal ultrafiltration  3. Chronic hypotension:  Blood pressure currently 87/55 which is close to his baseline. Continue midodrine 10 mg by mouth 3 times a day.  4. Anemia of chronic kidney disease:  Epogen remains on hold for hemoglobin of 11.5.  5. Secondary Hyperparathyroidism: Awaiting repeat phosphorus today.  6. Disposition- Family meeting with Dr Amado Coe and Patient's  daughter Difficult situation.  Care management has had some difficulties in placing the patient.     LOS: 16 [name] 12/20/201712:00 PM

## 2016-07-29 NOTE — Progress Notes (Signed)
  End of hd 

## 2016-07-29 NOTE — Progress Notes (Signed)
Post hd assessment 

## 2016-07-29 NOTE — Progress Notes (Signed)
PT Cancellation Note  Patient Details Name: [name] MRN: [id number] DOB: 20-Feb-1951   Cancelled Treatment:    Reason Eval/Treat Not Completed: Other (comment).  Pt currently off floor at dialysis.  Will re-attempt PT treatment at a later date/time.   Irving Burton Carlos Heber 07/29/2016, 11:19 AM Hendricks Limes, PT [phone]

## 2016-07-29 NOTE — Progress Notes (Signed)
   07/29/16 0850 07/29/16 0920 07/29/16 0950  During Hemodialysis Assessment  Arterial Pressure (mmHg) -160 mmHg -170 mmHg -170 mmHg  Venous Pressure (mmHg) 210 mmHg 220 mmHg 220 mmHg  Transmembrane Pressure (mmHg) 40 mmHg 40 mmHg 40 mmHg  Ultrafiltration Rate (mL/min) 570 mL/min 570 mL/min 570 mL/min  Dialysate Flow Rate (mL/min) 800 ml/min 800 ml/min 800 ml/min  Conductivity: Machine  14.3 13.5 13.4  HD Safety Checks Performed Yes Yes Yes  Dialysis Fluid Bolus Normal Saline --  --   Bolus Amount (mL) 250 mL (prime) --  --   Dialysate Change 2K --  --   Intra-Hemodialysis Comments hd start, pt alert, calm, vss for baseline 335. pt alert 522. pt alert.

## 2016-07-29 NOTE — Progress Notes (Addendum)
ANTICOAGULATION CONSULT NOTE - FOLLOW UP   Pharmacy Consult for warfarin Indication: atrial fibrillation and hx of mural thrombus  No Known Allergies  Patient Measurements: Height: 6' (182.9 cm) Weight: 211 lb 1.6 oz (95.8 kg) IBW/kg (Calculated) : 77.6   Vital Signs: Temp: 98.3 F (36.8 C) (12/20 0410) Temp Source: Oral (12/20 0410) BP: 79/49 (12/20 0410) Pulse Rate: 88 (12/20 0410)  Labs:  Recent Labs  07/27/16 0505 07/28/16 0256 07/28/16 0259 07/29/16 0336  HGB 11.5*  --   --   --   HCT 35.3*  --   --   --   PLT 222  --   --   --   LABPROT 24.7* 27.5*  --  30.7*  INR 2.19 2.50  --  2.87  CREATININE 5.56*  --  4.48*  --     Estimated Creatinine Clearance: 19.2 mL/min (by C-G formula based on SCr of 4.48 mg/dL (H)).   Medical History: Past Medical History:  Diagnosis Date  . Anemia of chronic disease   . Apical mural thrombus   . Arthritis   . Chronic systolic heart failure (HCC)    a) ECHO [facility] 02/2014): EF <15%, multiple apical thrombi, RV mod enlarged, mod MR b. RHC (02/21/14) at [facility]: RA 16, RV 64/9 (15), PA 64/32 (42), PCWP: 24, CI: 1.4  . Diabetes mellitus with complication (HCC)   . Drug use    cocaine  . ESRD on hemodialysis (HCC)    a. MWF  . H/O noncompliance with medical treatment, presenting hazards to health   . HLD (hyperlipidemia)   . HTN (hypertension)   . Intrahepatic cholestasis 02/20/2016  . Mitral regurgitation   . PAF (paroxysmal atrial fibrillation) (HCC)   . Pneumonia       Assessment: 65 yo male restarting warfarin for hx of mural thrombus and PAF. Past records show warfarin dose of 2 mg daily. Pt has been off of warfarin per notes due to compliance issues; planning to d/c to nursing home.   DATE               INR              DOSE  12/13  1.50  3mg  12/14  1.43  3mg   12/15               1.64                 4mg  12/16               1.65                4 mg  12/17               1.75                5 mg  12/18  2.19  3mg     12/19  2.50  2mg  12/20  2.87    Goal of Therapy:  INR 2-3 Monitor platelets by anticoagulation protocol: Yes   Plan:  INR therapeutic. Will give warfarin 0.5mg  tonight.  Pt also on amiodarone. INR ordered daily.   Delsa Bern, PharmD 07/29/2016 8:13 AM

## 2016-07-29 NOTE — Progress Notes (Signed)
HD 12.18.2017    07/27/16 0941 07/27/16 1000 07/27/16 1030  During Hemodialysis Assessment  Blood Flow Rate (mL/min) 400 mL/min 400 mL/min 400 mL/min  Arterial Pressure (mmHg) -160 mmHg -160 mmHg -150 mmHg  Venous Pressure (mmHg) 150 mmHg 150 mmHg 180 mmHg  Transmembrane Pressure (mmHg) 50 mmHg 50 mmHg 50 mmHg  Ultrafiltration Rate (mL/min) 1000 mL/min 1000 mL/min 1000 mL/min  Dialysate Flow Rate (mL/min) 800 ml/min 800 ml/min 800 ml/min  Conductivity: Machine  13.6 14 13.9  HD Safety Checks Performed Yes Yes Yes  Dialysis Fluid Bolus Normal Saline --  --   Bolus Amount (mL) 250 mL --  --   Dialysate Change (3k 2.5ca) --  --   Intra-Hemodialysis Comments 250cc prime, intiated via R AVF without issue.  350 848. bp low. goal decreased slightly     07/27/16 1100 07/27/16 1130 07/27/16 1200  During Hemodialysis Assessment  Blood Flow Rate (mL/min) 400 mL/min 400 mL/min 400 mL/min  Arterial Pressure (mmHg) -150 mmHg 190 mmHg -150 mmHg  Venous Pressure (mmHg) 180 mmHg 180 mmHg 180 mmHg  Transmembrane Pressure (mmHg) 50 mmHg 40 mmHg 40 mmHg  Ultrafiltration Rate (mL/min) 1000 mL/min 810 mL/min 810 mL/min  Dialysate Flow Rate (mL/min) 800 ml/min 800 ml/min 800 ml/min  Conductivity: Machine  14 14 14.2  HD Safety Checks Performed Yes Yes Yes  Dialysis Fluid Bolus Normal Saline --  --   Bolus Amount (mL) 100 mL --  --   Dialysate Change --  --  --   Intra-Hemodialysis Comments 1362. bp remains low 1697. sleeping. no change.  2031. no change

## 2016-07-29 NOTE — Progress Notes (Signed)
[facility] [place] [facility]   PATIENT NAME: [name] MaceKenneth [name]    MR#:  [id number]  DATE OF BIRTH:  Jan 16, 1951  SUBJECTIVE:  CHIEF COMPLAINT:  Pt for HD today , no new complaints  REVIEW OF SYSTEMS:  CONSTITUTIONAL: No fever, fatigue or weakness.  EYES: No blurred or double vision.  EARS, NOSE, AND THROAT: No tinnitus or ear pain.  RESPIRATORY: No cough, shortness of breath, wheezing or hemoptysis.  CARDIOVASCULAR: No chest pain, orthopnea, edema.  GASTROINTESTINAL: No nausea, vomiting, diarrhea or abdominal pain.  GENITOURINARY: No dysuria, hematuria.  ENDOCRINE: No polyuria, nocturia,  HEMATOLOGY: No anemia, easy bruising or bleeding SKIN: No rash or lesion. MUSCULOSKELETAL: No joint pain or arthritis.   NEUROLOGIC: No tingling, numbness, weakness.  PSYCHIATRY: No anxiety or depression.   DRUG ALLERGIES:  No Known Allergies  VITALS:  Blood pressure (!) 81/50, pulse 94, temperature 97.8 F (36.6 C), temperature source Oral, resp. rate 20, height 6' (1.829 m), weight 99.6 kg (219 lb 9.6 oz), SpO2 94 %.  PHYSICAL EXAMINATION:  GENERAL:  65 y.o.-year-old patient lying in the bed with no acute distress.  EYES: Pupils equal, round, reactive to light and accommodation. No scleral icterus. Extraocular muscles intact.  HEENT: Head atraumatic, normocephalic. Oropharynx and nasopharynx clear.  NECK:  Supple, no jugular venous distention. No thyroid enlargement, no tenderness.  LUNGS: Normal breath sounds bilaterally, no wheezing, rales,rhonchi or crepitation. No use of accessory muscles of respiration.  CARDIOVASCULAR: S1, S2 normal. No murmurs, rubs, or gallops.  ABDOMEN: Soft, nontender, nondistended. Bowel sounds present. No organomegaly or mass.  EXTREMITIES: No pedal edema, cyanosis, or clubbing.  NEUROLOGIC: Cranial nerves II through XII are intact. Muscle strength 5/5 in all extremities. Sensation intact. Gait not checked.  PSYCHIATRIC: The  patient is alert and oriented x 3.  SKIN: No obvious rash, lesion, or ulcer.    LABORATORY PANEL:   CBC  Recent Labs Lab 07/27/16 0505  WBC 6.9  HGB 11.5*  HCT 35.3*  PLT 222   ------------------------------------------------------------------------------------------------------------------  Chemistries   Recent Labs Lab 07/28/16 0259  NA 132*  K 5.0  CL 93*  CO2 29  GLUCOSE 213*  BUN 34*  CREATININE 4.48*  CALCIUM 9.0   ------------------------------------------------------------------------------------------------------------------  Cardiac Enzymes No results for input(s): TROPONINI in the last 168 hours. ------------------------------------------------------------------------------------------------------------------  RADIOLOGY:  No results found.  EKG:   Orders placed or performed during the hospital encounter of 07/13/16  . ED EKG  . ED EKG  . EKG 12-Lead  . EKG 12-Lead  . EKG 12-Lead  . EKG 12-Lead    ASSESSMENT AND PLAN:   KennethJeffriesis a 65 y.o.malewith a known history of Chronic anemia, severe cardiomyopathy with ejection fraction less than 15%, chronic kidney disease and ESRD on hemodialysis, diabetes, there abuse, hyperlipidemia, hypertension- claims to be compliant with his dialysis in his medications. He started noticing generalized swelling and edema for last few days. Also feeling short of breath so came to emergency room.  * altered mental status- could be from the Ativan adverse effect Ativan dose reduced 0.5 mg twice a day as needed from 2 mg every 6 hours as needed for anxiety  * FTT  Because of poor quality of life with ESRD, persistent hypotension with midodrine,severe systolic heart failure, diabetes, atrial flutter, debility palliative care consult f/u  As per nurse's report patient is tolerating Medications but decreased by mouth intake  *Sacral decub with his ulcers stage II Doxycycline for 10 days, WOUND  CX Frequent repositioning of the patient and continue wound care  * Acute hypoxic resp failure 2/2 acute on chronic systolic congestive heart failure and Anasarca with end-stage renal disease. EF -20 % Continue hemodialysis  * Persistent hypotension Midodrine 10 mg po tid Started and blood pressure is little better    -* Diabetes';; resolved hypoglycemia. sugar is acceptable   *History of atrial flutter versus atrial tachycardia: Continue amiodarone, consider EP consultation, History of mural thrombus is not identified on recent echo, Warfarin started,INR 2.5  patient noncompliant  ,* severe malnutrition -received IV albumin for hypotension and poor volume status due to cardiomyopathy -dietitianfollowing  Discharge patient to skilled care facility with palliative care and continue hemodialysis , once find a new HD center. Pts d/c cancelled yesterday12/19, as HD center refused to take him 2/2 low BP     All the records are reviewed and case discussed with Care Management/Social Workerr. Management plans discussed with the patient, family and they are in agreement.  CODE STATUS: dnr  TOTAL TIME TAKING CARE OF THIS PATIENT: 30  minutes.   POSSIBLE D/C IN ???? DAYS, DEPENDING ON CLINICAL CONDITION.  Note: This dictation was prepared with Dragon dictation along with smaller phrase technology. Any transcriptional errors that result from this process are unintentional.   Ramonita Lab M.D on 07/29/2016 at 4:38 PM  Between 7am to 6pm - Pager - [phone] After 6pm go to [email or web address] - password [email or web address] [facility]  [facility]  Office  [phone]  CC: Primary care physician; Dimple Casey, MD

## 2016-07-30 LAB — CBC
HEMATOCRIT: 34.4 % — AB (ref 40.0–52.0)
Hemoglobin: 10.8 g/dL — ABNORMAL LOW (ref 13.0–18.0)
MCH: 27.4 pg (ref 26.0–34.0)
MCHC: 31.5 g/dL — AB (ref 32.0–36.0)
MCV: 87 fL (ref 80.0–100.0)
Platelets: 183 10*3/uL (ref 150–440)
RBC: 3.95 MIL/uL — ABNORMAL LOW (ref 4.40–5.90)
RDW: 18.4 % — AB (ref 11.5–14.5)
WBC: 6.2 10*3/uL (ref 3.8–10.6)

## 2016-07-30 LAB — GLUCOSE, CAPILLARY
GLUCOSE-CAPILLARY: 110 mg/dL — AB (ref 65–99)
GLUCOSE-CAPILLARY: 168 mg/dL — AB (ref 65–99)
Glucose-Capillary: 176 mg/dL — ABNORMAL HIGH (ref 65–99)
Glucose-Capillary: 174 mg/dL — ABNORMAL HIGH (ref 65–99)

## 2016-07-30 LAB — BASIC METABOLIC PANEL
Anion gap: 11 (ref 5–15)
BUN: 26 mg/dL — AB (ref 6–20)
CHLORIDE: 94 mmol/L — AB (ref 101–111)
CO2: 29 mmol/L (ref 22–32)
Calcium: 9.1 mg/dL (ref 8.9–10.3)
Creatinine, Ser: 4.14 mg/dL — ABNORMAL HIGH (ref 0.61–1.24)
GFR calc Af Amer: 16 mL/min — ABNORMAL LOW (ref >60)
GFR calc non Af Amer: 14 mL/min — ABNORMAL LOW (ref >60)
GLUCOSE: 118 mg/dL — AB (ref 65–99)
POTASSIUM: 4.5 mmol/L (ref 3.5–5.1)
Sodium: 134 mmol/L — ABNORMAL LOW (ref 135–145)

## 2016-07-30 LAB — PHOSPHORUS: Phosphorus: 2.7 mg/dL (ref 2.5–4.6)

## 2016-07-30 LAB — PROTIME-INR
INR: 2.62
Prothrombin Time: 28.5 seconds — ABNORMAL HIGH (ref 11.4–15.2)

## 2016-07-30 MED ORDER — ENSURE ENLIVE PO LIQD: 237.0000 mL | Freq: Four times a day (QID) | ORAL | 12 refills | Status: AC

## 2016-07-30 MED ORDER — COLLAGENASE 250 UNIT/GM EX OINT
TOPICAL_OINTMENT | Freq: Every day | CUTANEOUS | Status: DC
  Administered 2016-07-30: 16:00:00 via TOPICAL
  Filled 2016-07-30: qty 30

## 2016-07-30 MED ORDER — JUVEN PO PACK: 1.0000 | PACK | Freq: Two times a day (BID) | ORAL | Status: DC

## 2016-07-30 MED ORDER — JUVEN PO PACK: 1.0000 | PACK | Freq: Two times a day (BID) | ORAL | 0 refills | Status: AC

## 2016-07-30 MED ORDER — ENSURE ENLIVE PO LIQD
237.0000 mL | Freq: Four times a day (QID) | ORAL | Status: DC
  Administered 2016-07-30: 237 mL via ORAL

## 2016-07-30 MED ORDER — WARFARIN SODIUM 2 MG PO TABS
2.0000 mg | ORAL_TABLET | Freq: Once | ORAL | Status: DC
  Filled 2016-07-30: qty 1

## 2016-07-30 MED ORDER — COLLAGENASE 250 UNIT/GM EX OINT: TOPICAL_OINTMENT | Freq: Every day | CUTANEOUS | 0 refills | Status: AC

## 2016-07-30 NOTE — Consult Note (Addendum)
WOC Nurse wound consult note Reason for Consult: Consult requested for sacrum.  WOC consult was previously performed on 12/6; refer to consult notes.  Pressure injury was stage 2 and was present on admission. Pt has multiple systemic factors which can impair healing; it has declined at this time to unstageable and the size and location remain unchanged. Wound type: Unstageable pressure injury to sacrum Pressure Ulcer POA: Yes Measurement:5X2X.2cm Wound bed: 20% red around wound edges, 80% tightly adhered slough in the center of the wound. Drainage (amount, consistency, odor) Mod amt tan drainage, strong odor Periwound: Intact skin surrounding Dressing procedure/placement/frequency: Begin Santyl ointment for enzymatic debridement of nonviable tissue and foam dressing to protect location.  Air mattress to decrease pressure, optimize protein intake with nutrition consult.  Discussed plan of care with patient and he verbalized understanding. Please re-consult if further assistance is needed.  Thank-you,  Cammie Mcgee MSN, RN, CWOCN, [place], CNS [phone]

## 2016-07-30 NOTE — Progress Notes (Signed)
Finally reached [facility].  Refused to let them transfer me as I have been cut off twice before when this has happened.  Gave them our unit phone number and the patient's name.  Informed them that EMS transport has been called, although it will be at least an hour before he is transported and asked them to call us to obtain report.  She agreed.

## 2016-07-30 NOTE — Care Management (Addendum)
Contacted by Dr Cherylann Ratel and informed that patient has been accepted by [facility] on [address] and has to have dialysis  12.22 in the center.  This was the only barrier to discharge.  Patient will discharge back to [facility] today.  Spoke with patient's daughter Hermine Messick.  discussed that patient is medically stable for discharge and the barrier to discharge was finding another dialysis facility that would accept patient.   She asks that if patient has to return to the hospital could he be taken to [facility] to be near family.  Discussed it would depend on the urgency of health condition.  Discussed DNR and Tamilla verbalizes patient is to remain DNR.  Discussed that palliative care will follow patient at the snf.  Discussed that it remains family wish that patient not die during Christmas.  Notified Cheryl with [facility] and faxed discharge summary.  Dr Cherylann Ratel stated patient should be at the center at 10:30.  Will most likely need hoyer to transfer but CM has been informed patient has sat for his treatments. Updated CSW to relay to [facility] that patient should not be late for his treatment.

## 2016-07-30 NOTE — Progress Notes (Signed)
[facility]  ROUNDING NOTE   Subjective:  Significant coordination of care conducted today. Patient was declined readmission by his prior dialysis unit due to hypotension. However he has been accepted at another local dialysis center today. Patient appears to be in fair spirits at the moment.  Objective:  Vital signs in last 24 hours:  Temp:  [98.2 F (36.8 C)-98.4 F (36.9 C)] 98.2 F (36.8 C) (12/21 1134) Pulse Rate:  [89-96] 92 (12/21 1136) Resp:  [18] 18 (12/21 1134) BP: (75-84)/(38-56) 83/38 (12/21 1136) SpO2:  [97 %-100 %] 100 % (12/21 1134) Weight:  [99.8 kg (220 lb 1.6 oz)] 99.8 kg (220 lb 1.6 oz) (12/21 0401)  Weight change: 4.546 kg (10 lb 0.3 oz) Filed Weights   07/29/16 0840 07/29/16 1328 07/30/16 0401  Weight: 100.3 kg (221 lb 1.9 oz) 99.6 kg (219 lb 9.6 oz) 99.8 kg (220 lb 1.6 oz)    Intake/Output: I/O last 3 completed shifts: In: 50 [P.O.:50] Out: 1500 [Other:1500]   Intake/Output this shift:  Total I/O In: 357 [P.O.:357] Out: 0   Physical Exam: General: NAD, laying in bed  Head: Normocephalic, atraumatic. Moist oral mucosal membranes  Eyes: Anicteric  Neck: Supple, trachea midline  Lungs:  Mild basilar crackles, normal effort  Heart: irregular rhythm  Abdomen:  Soft, nontender, +distended  Extremities:  + Generalized edema.  Neurologic: Awake, alert, follows commands  Skin: Dry skin with excoriations  Access: Right arm AVF    Basic Metabolic Panel:  Recent Labs Lab 07/25/16 1505 07/27/16 0505 07/28/16 0259 07/30/16 0013  NA 135 132* 132* 134*  K 4.5 5.3* 5.0 4.5  CL 94* 92* 93* 94*  CO2 31 28 29 29   GLUCOSE 145* 145* 213* 118*  BUN 35* 46* 34* 26*  CREATININE 4.30* 5.56* 4.48* 4.14*  CALCIUM 9.1 9.1 9.0 9.1  PHOS 2.4* 3.2  --  2.7    Liver Function Tests:  Recent Labs Lab 07/25/16 1505 07/27/16 0505  ALBUMIN 2.8* 2.7*   No results for input(s): LIPASE, AMYLASE in the last 168 hours. No results for input(s):  AMMONIA in the last 168 hours.  CBC:  Recent Labs Lab 07/25/16 1505 07/27/16 0505 07/30/16 0013  WBC  --  6.9 6.2  HGB 11.5* 11.5* 10.8*  HCT  --  35.3* 34.4*  MCV  --  87.1 87.0  PLT  --  222 183    Cardiac Enzymes: No results for input(s): CKTOTAL, CKMB, CKMBINDEX, TROPONINI in the last 168 hours.  BNP: Invalid input(s): POCBNP  CBG:  Recent Labs Lab 07/29/16 0756 07/29/16 1712 07/29/16 2106 07/30/16 1109 07/30/16 1709  GLUCAP 127* 117* 110* 168* 176*    Microbiology: Results for orders placed or performed during the hospital encounter of 07/13/16  MRSA PCR Screening     Status: None   Collection Time: 07/13/16 10:11 PM  Result Value Ref Range Status   MRSA by PCR NEGATIVE NEGATIVE Final    Comment:        The GeneXpert MRSA Assay (FDA approved for NASAL specimens only), is one component of a comprehensive MRSA colonization surveillance program. It is not intended to diagnose MRSA infection nor to guide or monitor treatment for MRSA infections.   Aerobic/Anaerobic Culture (surgical/deep wound)     Status: None (Preliminary result)   Collection Time: 07/29/16  4:59 PM  Result Value Ref Range Status   Specimen Description SACRAL  Final   Special Requests NONE  Final   Gram Stain  Final    RARE WBC PRESENT, PREDOMINANTLY PMN MODERATE GRAM POSITIVE COCCI IN PAIRS FEW GRAM NEGATIVE RODS FEW GRAM POSITIVE RODS    Culture   Final    TOO YOUNG TO READ Performed at [facility]    Report Status PENDING  Incomplete    Coagulation Studies:  Recent Labs  07/28/16 0256 07/29/16 0336 07/30/16 0332  LABPROT 27.5* 30.7* 28.5*  INR 2.50 2.87 2.62    Urinalysis: No results for input(s): COLORURINE, LABSPEC, PHURINE, GLUCOSEU, HGBUR, BILIRUBINUR, KETONESUR, PROTEINUR, UROBILINOGEN, NITRITE, LEUKOCYTESUR in the last 72 hours.  Invalid input(s): APPERANCEUR    Imaging: No results found.   Medications:    . amiodarone  200 mg Oral Daily   . aspirin EC  81 mg Oral Daily  . atorvastatin  20 mg Oral QHS  . collagenase   Topical Daily  . docusate  100 mg Oral Daily  . feeding supplement (ENSURE ENLIVE)  237 mL Oral QID  . heparin  5,000 Units Subcutaneous Q8H  . hydrocortisone cream  1 application Topical TID  . insulin aspart  0-9 Units Subcutaneous TID WC  . mouth rinse  15 mL Mouth Rinse BID  . midodrine  10 mg Oral TID WC  . nutrition supplement (JUVEN)  1 packet Oral BID BM  . warfarin  2 mg Oral ONCE-1800  . Warfarin - Pharmacist Dosing Inpatient   Does not apply q1800   acetaminophen, albuterol, guaiFENesin-codeine, hydrOXYzine, lactulose, LORazepam  Assessment/ Plan:  [name] with ESRD on hemodialysis, atrial fibrillation, mural thrombus, systolic congestive heart failure, cocaine abuse  MWF [facility] [address] [facility]  1. ESRD, presented with volume overload and acute pulmonary edema. Daily dialysis. UF since admission  Almost 30 litres -patient completed hemodialysis yesterday.  No acute indication for dialysis today.  Patient will have outpatient hemodialysis tomorrow.  Patient may end up requiring daily dialysis for a period of time.  2. Volume overload Chronic Systolic congestive heart failure: - echocardiogram < 20 %  - overall improved significantly from admission.  Hypotension playing a role in preventing optimal ultrafiltration  3. Chronic hypotension:  The patient's blood pressure overall remains quite low.  Currently blood pressure is 83/38 2 to his underlying cardiomyopathy.  He will continue on midodrine 10 mg by mouth 3 times a day.  4. Anemia of chronic kidney disease:  Hemoglobin currently 10.8.  We will consider use of Epogen as an outpatient.  5. Secondary Hyperparathyroidism: phosphorus 2.7 and acceptable.  We will continue to monitor calcium, phosphorus, and PTH as an outpatient.  6. Disposition- Family meeting with Dr Amado Coe and Patient's  daughter Difficult situation.  Care management has had some difficulties in placing the patient.     LOS: 17 [name] 12/21/20175:23 PM

## 2016-07-30 NOTE — Care Management Important Message (Signed)
Important Message  Patient Details  Name: [name] MRN: [id number] Date of Birth: 29-Jul-1951   Medicare Important Message Given:  Other (see comment)  Patient is not always oriented but appropriate at time notice given.  Also contacted patient's daughter Hermine Messick and explained notice.    Eber Hong, RN 07/30/2016, 3:25 PM

## 2016-07-30 NOTE — Care Management (Signed)
Dr Cherylann Ratel informed CM that [facility] on [address] has an opening that could accommodate this patient.  Notified Susann Givens and asked that she follow up with CM Lead- Collie Siad

## 2016-07-30 NOTE — Discharge Summary (Signed)
[facility] [place] at [facility]   PATIENT NAME: [name]    MR#:  [id number]  DATE OF BIRTH:  1950-12-10  DATE OF ADMISSION:  07/13/2016 ADMITTING PHYSICIAN: Altamese Dilling, MD  DATE OF DISCHARGE: 07/30/16  PRIMARY CARE PHYSICIAN: Dimple Casey, MD    ADMISSION DIAGNOSIS:  Acute on chronic systolic congestive heart failure (HCC) [I50.23] Acute respiratory failure with hypoxia (HCC) [J96.01] Other hypervolemia [E87.79]  DISCHARGE DIAGNOSIS:   Acute on chronic systolic congestive heart failure Anasarca Acute respiratory failure with hypoxia Persistent hypotension Sacral decub ulcer SECONDARY DIAGNOSIS:   Past Medical History:  Diagnosis Date  . Anemia of chronic disease   . Apical mural thrombus   . Arthritis   . Chronic systolic heart failure (HCC)    a) ECHO [facility] 02/2014): EF <15%, multiple apical thrombi, RV mod enlarged, mod MR b. RHC (02/21/14) at [facility]: RA 16, RV 64/9 (15), PA 64/32 (42), PCWP: 24, CI: 1.4  . Diabetes mellitus with complication (HCC)   . Drug use    cocaine  . ESRD on hemodialysis (HCC)    a. MWF  . H/O noncompliance with medical treatment, presenting hazards to health   . HLD (hyperlipidemia)   . HTN (hypertension)   . Intrahepatic cholestasis 02/20/2016  . Mitral regurgitation   . PAF (paroxysmal atrial fibrillation) (HCC)   . Pneumonia     HOSPITAL COURSE:  KennethJeffriesis a 65 y.o.malewith a known history of Chronic anemia, severe cardiomyopathy with ejection fraction less than 15%, chronic kidney disease and ESRD on hemodialysis, diabetes, there abuse, hyperlipidemia, hypertension- claims to be compliant with his dialysis in his medications. He started noticing generalized swelling and edema for last few days. Also feeling short of breath so came to emergency room.  * altered mental status- Resolved. Patient is at his baseline  Ativan dose reduced 0.5 mg twice a day as needed from 2 mg every  6 hours as needed for anxiety  * FTT  Because of poor quality of life with ESRD, persistent hypotension with midodrine,severe systolic heart failure, diabetes, atrial flutter,  debility  palliative care consult f/u  As per nurse's report patient is tolerating  Medications but decreased by mouth intake  *Sacral decub with his ulcers stage II Doxycycline for 10 days. Follow-up on the wound culture which is pending at this time Frequent repositioning of the patient and continue wound care  * Acute hypoxic resp failure 2/2 acute  on chronic systolic congestive heart failure and Anasarca with end-stage renal disease. EF -20 % Continue hemodialysis  * Persistent hypotension Midodrine 10 mg po tid Started and blood pressure is little better     -* Diabetes';; resolved hypoglycemia.  sugar is acceptable   *History of atrial flutter versus atrial tachycardia: Continue amiodarone, consider EP consultation, History of mural thrombus is not identified on recent echo,  Warfarin started,INR 2.5  patient noncompliant  ,* severe malnutrition -received IV albumin for hypotension and poor volume status due to cardiomyopathy -dietitianfollowing  Discharge patient to skilled care facility with palliative care and continue hemodialysis   Appreciate case management and social worker assistance  Case discussed with Care Management/Social Worker. Management plans discussed with the patient, family and they are in agreement.  CODE STATUS:DNR\  DISCHARGE CONDITIONS:   fair  CONSULTS OBTAINED:     PROCEDURES HD  DRUG ALLERGIES:  No Known Allergies  DISCHARGE MEDICATIONS:   Current Discharge Medication List    START taking these medications  Details  collagenase (SANTYL) ointment Apply topically daily. Qty: 15 g, Refills: 0    doxycycline (VIBRAMYCIN) 50 MG capsule Take 2 capsules (100 mg total) by mouth 2 (two) times daily. Qty: 40 capsule, Refills: 0    !!  feeding supplement, ENSURE ENLIVE, (ENSURE ENLIVE) LIQD Take 237 mLs by mouth 2 (two) times daily between meals. Qty: 237 mL, Refills: 12    !! feeding supplement, ENSURE ENLIVE, (ENSURE ENLIVE) LIQD Take 237 mLs by mouth 3 (three) times daily between meals. Qty: 237 mL, Refills: 12    !! feeding supplement, ENSURE ENLIVE, (ENSURE ENLIVE) LIQD Take 237 mLs by mouth 4 (four) times daily. Qty: 237 mL, Refills: 12    LORazepam (ATIVAN) 0.5 MG tablet Take 1 tablet (0.5 mg total) by mouth 2 (two) times daily as needed for anxiety (agitation). Qty: 30 tablet, Refills: 0    midodrine (PROAMATINE) 10 MG tablet Take 1 tablet (10 mg total) by mouth 3 (three) times daily with meals. Qty: 90 tablet, Refills: 0    nutrition supplement, JUVEN, (JUVEN) PACK Take 1 packet by mouth 2 (two) times daily between meals. Qty: 30 packet, Refills: 0    warfarin (COUMADIN) 2 MG tablet Take 1 tablet (2 mg total) by mouth daily. Qty: 5 tablet, Refills: 0     !! - Potential duplicate medications found. Please discuss with provider.    CONTINUE these medications which have CHANGED   Details  insulin aspart (NOVOLOG) 100 UNIT/ML injection Inject 0-9 Units into the skin 3 (three) times daily with meals. Qty: 10 mL, Refills: 11      CONTINUE these medications which have NOT CHANGED   Details  acetaminophen (TYLENOL) 325 MG tablet Take 650 mg by mouth every 6 (six) hours as needed for mild pain or moderate pain.    amiodarone (PACERONE) 200 MG tablet Take 1 tablet (200 mg total) by mouth daily. Qty: 30 tablet, Refills: 6    aspirin EC 81 MG EC tablet Take 1 tablet (81 mg total) by mouth daily.    atorvastatin (LIPITOR) 20 MG tablet Take 20 mg by mouth at bedtime.    guaiFENesin-codeine (ROBITUSSIN AC) 100-10 MG/5ML syrup Take 5 mLs by mouth every 6 (six) hours as needed for cough.    hydrocortisone cream 1 % Apply 1 application topically 3 (three) times daily.    Melatonin 3 MG TABS Take 3 mg by mouth at  bedtime.    sevelamer carbonate (RENVELA) 800 MG tablet Take 3 tablets (2,400 mg total) by mouth 3 (three) times daily with meals. Qty: 90 tablet, Refills: 0      STOP taking these medications     insulin NPH Human (HUMULIN N,NOVOLIN N) 100 UNIT/ML injection          DISCHARGE INSTRUCTIONS:   Continue hemodialysis and follow-up with nephrology Dr. Cherylann RatelLateef Follow-up with primary care physician at the facility Follow up with the palliative care at the facility in 3-5 days DIET:  Renal dietWith feeding supplements  DISCHARGE CONDITION:  Fair  ACTIVITY:  Activity as tolerated  OXYGEN:  Home Oxygen: No.   Oxygen Delivery: room air  DISCHARGE LOCATION:  nursing home   If you experience worsening of your admission symptoms, develop shortness of breath, life threatening emergency, suicidal or homicidal thoughts you must seek medical attention immediately by calling 911 or calling your MD immediately  if symptoms less severe.  You Must read complete instructions/literature along with all the possible adverse reactions/side effects for all the Medicines  you take and that have been prescribed to you. Take any new Medicines after you have completely understood and accpet all the possible adverse reactions/side effects.   Please note  You were cared for by a hospitalist during your hospital stay. If you have any questions about your discharge medications or the care you received while you were in the hospital after you are discharged, you can call the unit and asked to speak with the hospitalist on call if the hospitalist that took care of you is not available. Once you are discharged, your primary care physician will handle any further medical issues. Please note that NO REFILLS for any discharge medications will be authorized once you are discharged, as it is imperative that you return to your primary care physician (or establish a relationship with a primary care physician if you do  not have one) for your aftercare needs so that they can reassess your need for medications and monitor your lab values.     Today  Chief Complaint  Patient presents with  . Testicle Pain   Patient is doing fine today. More alert no complaints  ROS:  CONSTITUTIONAL: Denies fevers, chills. Denies any fatigue, weakness.  EYES: Denies blurry vision, double vision, eye pain. EARS, NOSE, THROAT: Denies tinnitus, ear pain, hearing loss. RESPIRATORY: Denies cough, wheeze, shortness of breath.  CARDIOVASCULAR: Denies chest pain, palpitations, edema.  GASTROINTESTINAL: Denies nausea, vomiting, diarrhea, abdominal pain. Denies bright red blood per rectum. GENITOURINARY: Denies dysuria, hematuria. ENDOCRINE: Denies nocturia or thyroid problems. HEMATOLOGIC AND LYMPHATIC: Denies easy bruising or bleeding. SKIN: Denies rash or lesion. MUSCULOSKELETAL: Denies pain in neck, back, shoulder, knees, hips or arthritic symptoms.  NEUROLOGIC: Denies paralysis, paresthesias.  PSYCHIATRIC: Denies anxiety or depressive symptoms.   VITAL SIGNS:  Blood pressure (!) 83/38, pulse 92, temperature 98.2 F (36.8 C), temperature source Oral, resp. rate 18, height 6' (1.829 m), weight 99.8 kg (220 lb 1.6 oz), SpO2 100 %.  I/O:    Intake/Output Summary (Last 24 hours) at 07/30/16 1521 Last data filed at 07/30/16 1453  Gross per 24 hour  Intake              357 ml  Output                0 ml  Net              357 ml    PHYSICAL EXAMINATION:  GENERAL:  66 y.o.-year-old patient lying in the bed with no acute distress.  EYES: Pupils equal, round, reactive to light and accommodation. No scleral icterus. Extraocular muscles intact.  HEENT: Head atraumatic, normocephalic. Oropharynx and nasopharynx clear.  NECK:  Supple, no jugular venous distention. No thyroid enlargement, no tenderness.  LUNGS: Normal breath sounds bilaterally, no wheezing, rales,rhonchi or crepitation. No use of accessory muscles of  respiration.  CARDIOVASCULAR: S1, S2 normal. No murmurs, rubs, or gallops.  ABDOMEN: Soft, non-tender, non-distended. Bowel sounds present. No organomegaly or mass.  EXTREMITIES: Anasarca with 4+ pedal edema No  cyanosis, or clubbing.  NEUROLOGIC: Cranial nerves II through XII are intact. Muscle strength diminished in all extremities. Sensation intact. Gait not checked.  PSYCHIATRIC: The patient is alert and oriented x 3.  SKIN: No obvious rash, lesion, or ulcer.   DATA REVIEW:   CBC  Recent Labs Lab 07/30/16 0013  WBC 6.2  HGB 10.8*  HCT 34.4*  PLT 183    Chemistries   Recent Labs Lab 07/30/16 0013  NA 134*  K  4.5  CL 94*  CO2 29  GLUCOSE 118*  BUN 26*  CREATININE 4.14*  CALCIUM 9.1    Cardiac Enzymes No results for input(s): TROPONINI in the last 168 hours.  Microbiology Results  Results for orders placed or performed during the hospital encounter of 07/13/16  MRSA PCR Screening     Status: None   Collection Time: 07/13/16 10:11 PM  Result Value Ref Range Status   MRSA by PCR NEGATIVE NEGATIVE Final    Comment:        The GeneXpert MRSA Assay (FDA approved for NASAL specimens only), is one component of a comprehensive MRSA colonization surveillance program. It is not intended to diagnose MRSA infection nor to guide or monitor treatment for MRSA infections.   Aerobic/Anaerobic Culture (surgical/deep wound)     Status: None (Preliminary result)   Collection Time: 07/29/16  4:59 PM  Result Value Ref Range Status   Specimen Description SACRAL  Final   Special Requests NONE  Final   Gram Stain   Final    RARE WBC PRESENT, PREDOMINANTLY PMN MODERATE GRAM POSITIVE COCCI IN PAIRS FEW GRAM NEGATIVE RODS FEW GRAM POSITIVE RODS    Culture   Final    TOO YOUNG TO READ Performed at [facility]    Report Status PENDING  Incomplete    RADIOLOGY:  No results found.  EKG:   Orders placed or performed during the hospital encounter of 07/13/16  .  ED EKG  . ED EKG  . EKG 12-Lead  . EKG 12-Lead  . EKG 12-Lead  . EKG 12-Lead      Management plans discussed with the patient, family and they are in agreement.  CODE STATUS:     Code Status Orders        Start     Ordered   07/13/16 2203  Do not attempt resuscitation (DNR)  Continuous    Question Answer Comment  In the event of cardiac or respiratory ARREST Do not call a "code blue"   In the event of cardiac or respiratory ARREST Do not perform Intubation, CPR, defibrillation or ACLS   In the event of cardiac or respiratory ARREST Use medication by any route, position, wound care, and other measures to relive pain and suffering. May use oxygen, suction and manual treatment of airway obstruction as needed for comfort.      07/13/16 2202    Code Status History    Date Active Date Inactive Code Status Order ID Comments User Context   05/22/2016  2:49 AM 05/23/2016  7:07 PM Full Code [id number]  Gwendolyn Fill, NP ED   05/03/2016 12:21 AM 05/06/2016  9:49 PM Full Code [id number]  Michael Litter, MD Inpatient   02/17/2016  7:38 PM 02/22/2016 11:44 PM DNR [id number]  Renne Musca, MD Inpatient   06/22/2014  2:36 PM 06/28/2014  8:24 PM Full Code [id number]  Berdine Dance, MD Inpatient   06/08/2014 12:10 PM 06/22/2014  2:36 PM Full Code [id number]  Alyson Reedy, MD ED      TOTAL TIME TAKING CARE OF THIS PATIENT: 45 minutes.   Note: This dictation was prepared with Dragon dictation along with smaller phrase technology. Any transcriptional errors that result from this process are unintentional.   @M[facility] @  on 07/30/2016 at 3:21 PM  Between 7am to 6pm - Pager - [phone]  After 6pm go to [email or web address] - password [email or web address] [facility]  [facility]  Office  [phone]  CC: Primary care  physician; Dimple Casey, MD

## 2016-07-30 NOTE — Progress Notes (Signed)
Patient is being discharged back to [facility].  Dialysis will be provided by [facility].  I notified his daughter Christin Bach) about the transfer.  Report called to [facility].

## 2016-07-30 NOTE — Progress Notes (Signed)
ANTICOAGULATION CONSULT NOTE - FOLLOW UP   Pharmacy Consult for warfarin Indication: atrial fibrillation and hx of mural thrombus  No Known Allergies  Patient Measurements: Height: 6' (182.9 cm) Weight: 220 lb 1.6 oz (99.8 kg) IBW/kg (Calculated) : 77.6   Vital Signs: Temp: 98.2 F (36.8 C) (12/21 0401) Temp Source: Oral (12/21 0401) BP: 77/49 (12/21 0401) Pulse Rate: 89 (12/21 0401)  Labs:  Recent Labs  07/28/16 0256 07/28/16 0259 07/29/16 0336 07/30/16 0013 07/30/16 0332  HGB  --   --   --  10.8*  --   HCT  --   --   --  34.4*  --   PLT  --   --   --  183  --   LABPROT 27.5*  --  30.7*  --  28.5*  INR 2.50  --  2.87  --  2.62  CREATININE  --  4.48*  --  4.14*  --     Estimated Creatinine Clearance: 21.2 mL/min (by C-G formula based on SCr of 4.14 mg/dL (H)).   Medical History: Past Medical History:  Diagnosis Date  . Anemia of chronic disease   . Apical mural thrombus   . Arthritis   . Chronic systolic heart failure (HCC)    a) ECHO [facility] 02/2014): EF <15%, multiple apical thrombi, RV mod enlarged, mod MR b. RHC (02/21/14) at [facility]: RA 16, RV 64/9 (15), PA 64/32 (42), PCWP: 24, CI: 1.4  . Diabetes mellitus with complication (HCC)   . Drug use    cocaine  . ESRD on hemodialysis (HCC)    a. MWF  . H/O noncompliance with medical treatment, presenting hazards to health   . HLD (hyperlipidemia)   . HTN (hypertension)   . Intrahepatic cholestasis 02/20/2016  . Mitral regurgitation   . PAF (paroxysmal atrial fibrillation) (HCC)   . Pneumonia       Assessment: 65 yo male restarting warfarin for hx of mural thrombus and PAF. Past records show warfarin dose of 2 mg daily. Pt has been off of warfarin per notes due to compliance issues; planning to d/c to nursing home.   DATE               INR              DOSE  12/13  1.50  3mg  12/14  1.43  3mg   12/15               1.64                 4mg  12/16               1.65                4 mg  12/17                1.75                5 mg  12/18  2.19  3mg    12/19  2.50  2mg  12/20  2.87  0.5mg  12/21   2.62  Goal of Therapy:  INR 2-3 Monitor platelets by anticoagulation protocol: Yes   Plan:  INR therapeutic. Will give warfarin 2mg  tonight.  Pt also on amiodarone. INR ordered daily.   Delsa Bern, PharmD 07/30/2016 8:31 AM

## 2016-07-30 NOTE — Progress Notes (Signed)
Patient is very agitated.  He wants to get out of bed to the commode.  We got hi up to the commode with great difficulty this morning, and it took 4 people to return him to the bed.   I told him I was VERY concerned about his safety if we were to attempt it again.  He has refused his Midodrine and continues to shoot at me and the other staff.

## 2016-07-30 NOTE — Progress Notes (Addendum)
Nutrition Follow-up  DOCUMENTATION CODES:   Non-severe (moderate) malnutrition in context of chronic illness  INTERVENTION:  1. RN requested increase patient's Ensure Enlive to QID, each supplement provides 350 kcal and 20 grams of protein 2. Add Juven 1 pkt BID, each supplement provides 14gm protein and 80 calories - Arginine and Glutamine will help with wound healing  NUTRITION DIAGNOSIS:   Malnutrition related to chronic illness as evidenced by moderate depletion of body fat, moderate depletions of muscle mass, severe fluid accumulation. -ongoing  GOAL:   Patient will meet greater than or equal to 90% of their needs -progressing  MONITOR:   PO intake, Supplement acceptance, Labs, Weight trends  REASON FOR ASSESSMENT:   Malnutrition Screening Tool    ASSESSMENT:   65 yo male admitted with acute on chronic CHF, severe anasarca with ESRD on HD. Pt with CHF with EF 15%, CKD, HTN, HLD  [name] continues to refuse food but is consuming 100% of ensures. Documented meal completion 0% thus far today. Weight continues to fluctuate. More aware, less confused, answering questions appropriately. No complaints besides poor appetite. Juven will help with pressure ulcer healing. Labs and medications reviewed: CBGs: 168 Na 134 Colace, Warfarin  Diet Order:  Diet renal/carb modified with fluid restriction Diet-HS Snack? Nothing; Room service appropriate? Yes; Fluid consistency: Thin; Fluid restriction: 1200 mL Fluid  Skin:  Wound (see comment) (stage II coccyx)  Last BM:  12/15  Height:   Ht Readings from Last 1 Encounters:  07/13/16 6' (1.829 m)    Weight:   Wt Readings from Last 1 Encounters:  07/30/16 220 lb 1.6 oz (99.8 kg)    Ideal Body Weight:     BMI:  Body mass index is 29.85 kg/m.  Estimated Nutritional Needs:   Kcal:  2000-2400kcal/day   Protein:  124-144g/day   Fluid:  or per MD discretion   EDUCATION NEEDS:   No education needs  identified at this time  [name]. [name] Almanza, MS, RD LDN Inpatient Clinical Dietitian Pager [phone]

## 2016-07-30 NOTE — Progress Notes (Signed)
Five attempts to call [facility] to give report.  Only once the phone was answered I was put on hold and greater than 5 minutes later I hung up and tried again and got no answer.  EMS has been called for transport.  I will continue to try to reach [facility].

## 2016-07-30 NOTE — Progress Notes (Signed)
EMS here to transport patient to [place] [facility].

## 2016-07-30 NOTE — Clinical Social Work Note (Signed)
Patient to be d/c'ed today to [facility].  Patient and family agreeable to plans will transport via ems RN to call report room 33A [phone]  Windell Moulding, MSW, LCSWA Mon-Fri 8a-4:30p [phone]

## 2016-07-30 NOTE — Progress Notes (Signed)
Patient states he has to have a bowel movement.  Sat on commode for 30 minutes, refusing to get off.  Agreed to go back to bed when I told him I would give him a dose of lactulose to help him go.

## 2016-08-03 LAB — AEROBIC/ANAEROBIC CULTURE W GRAM STAIN (SURGICAL/DEEP WOUND)

## 2016-08-03 LAB — AEROBIC/ANAEROBIC CULTURE (SURGICAL/DEEP WOUND)

## 2016-08-10 ENCOUNTER — Emergency Department
Admission: EM | Admit: 2016-08-10 | Discharge: 2016-08-10 | Payer: Medicare Other | Attending: Emergency Medicine | Admitting: Emergency Medicine

## 2016-08-10 ENCOUNTER — Emergency Department: Payer: Medicare Other

## 2016-08-10 DIAGNOSIS — Y939 Activity, unspecified: Secondary | ICD-10-CM | POA: Insufficient documentation

## 2016-08-10 DIAGNOSIS — Z79899 Other long term (current) drug therapy: Secondary | ICD-10-CM | POA: Insufficient documentation

## 2016-08-10 DIAGNOSIS — S065X9A Traumatic subdural hemorrhage with loss of consciousness of unspecified duration, initial encounter: Secondary | ICD-10-CM

## 2016-08-10 DIAGNOSIS — Z992 Dependence on renal dialysis: Secondary | ICD-10-CM | POA: Insufficient documentation

## 2016-08-10 DIAGNOSIS — S72102A Unspecified trochanteric fracture of left femur, initial encounter for closed fracture: Secondary | ICD-10-CM | POA: Diagnosis not present

## 2016-08-10 DIAGNOSIS — N186 End stage renal disease: Secondary | ICD-10-CM | POA: Insufficient documentation

## 2016-08-10 DIAGNOSIS — W06XXXA Fall from bed, initial encounter: Secondary | ICD-10-CM | POA: Diagnosis not present

## 2016-08-10 DIAGNOSIS — S79912A Unspecified injury of left hip, initial encounter: Secondary | ICD-10-CM | POA: Diagnosis present

## 2016-08-10 DIAGNOSIS — E1122 Type 2 diabetes mellitus with diabetic chronic kidney disease: Secondary | ICD-10-CM | POA: Diagnosis not present

## 2016-08-10 DIAGNOSIS — Y999 Unspecified external cause status: Secondary | ICD-10-CM | POA: Diagnosis not present

## 2016-08-10 DIAGNOSIS — Y929 Unspecified place or not applicable: Secondary | ICD-10-CM | POA: Diagnosis not present

## 2016-08-10 DIAGNOSIS — I9589 Other hypotension: Secondary | ICD-10-CM | POA: Insufficient documentation

## 2016-08-10 DIAGNOSIS — I5022 Chronic systolic (congestive) heart failure: Secondary | ICD-10-CM | POA: Insufficient documentation

## 2016-08-10 DIAGNOSIS — Z794 Long term (current) use of insulin: Secondary | ICD-10-CM | POA: Insufficient documentation

## 2016-08-10 DIAGNOSIS — Z7901 Long term (current) use of anticoagulants: Secondary | ICD-10-CM | POA: Diagnosis not present

## 2016-08-10 DIAGNOSIS — Z7982 Long term (current) use of aspirin: Secondary | ICD-10-CM | POA: Diagnosis not present

## 2016-08-10 DIAGNOSIS — I132 Hypertensive heart and chronic kidney disease with heart failure and with stage 5 chronic kidney disease, or end stage renal disease: Secondary | ICD-10-CM | POA: Diagnosis not present

## 2016-08-10 DIAGNOSIS — S065X0A Traumatic subdural hemorrhage without loss of consciousness, initial encounter: Secondary | ICD-10-CM | POA: Diagnosis not present

## 2016-08-10 DIAGNOSIS — S065XAA Traumatic subdural hemorrhage with loss of consciousness status unknown, initial encounter: Secondary | ICD-10-CM

## 2016-08-10 DIAGNOSIS — S72002A Fracture of unspecified part of neck of left femur, initial encounter for closed fracture: Secondary | ICD-10-CM

## 2016-08-10 LAB — CBC WITH DIFFERENTIAL/PLATELET
BASOS PCT: 1 %
Basophils Absolute: 0.1 10*3/uL (ref 0–0.1)
Eosinophils Absolute: 0.1 10*3/uL (ref 0–0.7)
Eosinophils Relative: 1 %
HEMATOCRIT: 35.3 % — AB (ref 40.0–52.0)
HEMOGLOBIN: 12 g/dL — AB (ref 13.0–18.0)
Lymphocytes Relative: 7 %
Lymphs Abs: 0.6 10*3/uL — ABNORMAL LOW (ref 1.0–3.6)
MCH: 28.7 pg (ref 26.0–34.0)
MCHC: 33.9 g/dL (ref 32.0–36.0)
MCV: 84.6 fL (ref 80.0–100.0)
MONOS PCT: 14 %
Monocytes Absolute: 1.2 10*3/uL — ABNORMAL HIGH (ref 0.2–1.0)
NEUTROS ABS: 6.9 10*3/uL — AB (ref 1.4–6.5)
NEUTROS PCT: 77 %
Platelets: 204 10*3/uL (ref 150–440)
RBC: 4.17 MIL/uL — ABNORMAL LOW (ref 4.40–5.90)
RDW: 20.5 % — ABNORMAL HIGH (ref 11.5–14.5)
WBC: 8.9 10*3/uL (ref 3.8–10.6)

## 2016-08-10 LAB — PROTIME-INR
INR: 3.2
PROTHROMBIN TIME: 33.5 s — AB (ref 11.4–15.2)

## 2016-08-10 LAB — APTT: APTT: 60 s — AB (ref 24–36)

## 2016-08-10 LAB — COMPREHENSIVE METABOLIC PANEL
ALK PHOS: 83 U/L (ref 38–126)
ALT: 19 U/L (ref 17–63)
ANION GAP: 14 (ref 5–15)
AST: 41 U/L (ref 15–41)
Albumin: 2.5 g/dL — ABNORMAL LOW (ref 3.5–5.0)
BUN: 36 mg/dL — ABNORMAL HIGH (ref 6–20)
CALCIUM: 8.9 mg/dL (ref 8.9–10.3)
CHLORIDE: 91 mmol/L — AB (ref 101–111)
CO2: 27 mmol/L (ref 22–32)
Creatinine, Ser: 5.83 mg/dL — ABNORMAL HIGH (ref 0.61–1.24)
GFR calc non Af Amer: 9 mL/min — ABNORMAL LOW (ref >60)
GFR, EST AFRICAN AMERICAN: 10 mL/min — AB (ref >60)
Glucose, Bld: 145 mg/dL — ABNORMAL HIGH (ref 65–99)
Potassium: 3.9 mmol/L (ref 3.5–5.1)
SODIUM: 132 mmol/L — AB (ref 135–145)
Total Bilirubin: 7.3 mg/dL — ABNORMAL HIGH (ref 0.3–1.2)
Total Protein: 8.6 g/dL — ABNORMAL HIGH (ref 6.5–8.1)

## 2016-08-10 MED ORDER — SODIUM CHLORIDE 0.9 % IV BOLUS (SEPSIS)
500.0000 mL | Freq: Once | INTRAVENOUS | Status: AC
  Administered 2016-08-10: 500 mL via INTRAVENOUS

## 2016-08-10 MED ORDER — FENTANYL CITRATE (PF) 100 MCG/2ML IJ SOLN
25.0000 ug | Freq: Once | INTRAMUSCULAR | Status: AC
  Administered 2016-08-10: 25 ug via INTRAVENOUS
  Filled 2016-08-10: qty 2

## 2016-08-10 MED ORDER — SODIUM CHLORIDE 0.9 % IV BOLUS (SEPSIS)
250.0000 mL | Freq: Once | INTRAVENOUS | Status: AC
  Administered 2016-08-10: 250 mL via INTRAVENOUS

## 2016-08-10 MED ORDER — FENTANYL CITRATE (PF) 100 MCG/2ML IJ SOLN
25.0000 ug | Freq: Once | INTRAMUSCULAR | Status: AC
  Administered 2016-08-10: 25 ug via INTRAVENOUS

## 2016-08-10 MED ORDER — VITAMIN K1 10 MG/ML IJ SOLN
10.0000 mg | Freq: Once | INTRAMUSCULAR | Status: AC
  Administered 2016-08-10: 10 mg via SUBCUTANEOUS
  Filled 2016-08-10: qty 1

## 2016-08-10 NOTE — ED Notes (Signed)
Patient states that he was getting up to assist another resident and slipped and fell. Patient with complaint of left hip pain. Patient with rotation to left hip. Patient with positive pedal edema to bilateral feet.

## 2016-08-10 NOTE — ED Triage Notes (Signed)
Pt from [place] [facility] with fall from standing position. Pt is hypotensive on arrival. Pt with dialysis shunt to right arm, complains of left hip pain. 2+ pedal pulse noted, bilateral lower extremity edema.

## 2016-08-10 NOTE — ED Notes (Signed)
Third unit of RBCs given to [employer] for transfusing during transport.

## 2016-08-10 NOTE — ED Provider Notes (Signed)
[facility] Emergency Department Provider Note   ____________________________________________   First MD Initiated Contact with Patient 08/10/16 0120     (approximate)  I have reviewed the triage vital signs and the nursing notes.   HISTORY  Chief Complaint Fall    HPI Ziere Docken is a 66 y.o. male who comes into the hospital today with a fall and some left hip pain. The patient reports that he slipped trying to help someone up. According to EMS he fell out of bed. The patient reports he fell on his left side and he thinks he may have slightly hit his head. The patient is having some significant left hip pain. The patient denies pain anywhere else. EMS attempted to move the patient and they felt a pop and some movement of his left hip. The patient was not given any pain medications by EMS. His pain is a 6 out of 10 in intensity. He denies any previous injury to his hip. The patient is on Coumadin. The patient was brought into the hospital today for evaluation of his hip pain   Past Medical History:  Diagnosis Date  . Anemia of chronic disease   . Apical mural thrombus   . Arthritis   . Chronic systolic heart failure (HCC)    a) ECHO [facility] 02/2014): EF <15%, multiple apical thrombi, RV mod enlarged, mod MR b. RHC (02/21/14) at [facility]: RA 16, RV 64/9 (15), PA 64/32 (42), PCWP: 24, CI: 1.4  . Diabetes mellitus with complication (HCC)   . Drug use    cocaine  . ESRD on hemodialysis (HCC)    a. MWF  . H/O noncompliance with medical treatment, presenting hazards to health   . HLD (hyperlipidemia)   . HTN (hypertension)   . Intrahepatic cholestasis 02/20/2016  . Mitral regurgitation   . PAF (paroxysmal atrial fibrillation) (HCC)   . Pneumonia     Patient Active Problem List   Diagnosis Date Noted  . Palliative care encounter   . Goals of care, counseling/discussion   . Encounter for hospice care discussion   . Pressure injury of skin 07/14/2016  .  NICM (nonischemic cardiomyopathy) (HCC) 07/14/2016  . H/O noncompliance with medical treatment, presenting hazards to health 07/14/2016  . Anemia of chronic disease 07/14/2016  . Protein-calorie malnutrition, severe (HCC) 07/14/2016  . Malnutrition of moderate degree 07/14/2016  . Liver dysfunction   . Noncompliance with renal dialysis (HCC)   . ESRD (end stage renal disease) on dialysis (HCC) 05/03/2016  . Generalized weakness 05/02/2016  . Intrahepatic cholestasis 02/20/2016  . Direct hyperbilirubinemia   . Paroxysmal atrial fibrillation (HCC)   . Shortness of breath   . Type 2 diabetes mellitus with diabetic nephropathy, without long-term current use of insulin (HCC)   . End stage renal disease (HCC) 12/25/2014  . Atrial fibrillation [I48.91] 08/09/2014  . Encounter for therapeutic drug monitoring 08/09/2014  . Chronic systolic heart failure (HCC) 07/04/2014  . ESRD (end stage renal disease) (HCC) 07/04/2014  . PAF (paroxysmal atrial fibrillation) (HCC) 07/04/2014  . Controlled diabetes mellitus type II without complication (HCC) 06/22/2014  . Gout 06/22/2014  . ESRD needing dialysis (HCC)   . Pulmonary edema   . Bacteremia associated with intravascular line (HCC)   . Blood poisoning (HCC)   . Acute respiratory failure with hypoxia (HCC)   . AKI (acute kidney injury) (HCC)   . Screen for STD (sexually transmitted disease)   . Acute respiratory failure (HCC) 06/08/2014  .  Cardiogenic shock (HCC) 06/08/2014  . Acute on chronic systolic congestive heart failure (HCC) 06/08/2014  . Acute on chronic renal failure (HCC) 06/08/2014  . Hyperkalemia 06/08/2014  . Respiratory failure (HCC) 06/08/2014    Past Surgical History:  Procedure Laterality Date  . AV FISTULA PLACEMENT Right 06/27/2014   Procedure: ARTERIOVENOUS (AV) FISTULA CREATION VERSUS GRAFT INSERTION;  Surgeon: Pryor Ochoa, MD;  Location: [facility] OR;  Service: Vascular;  Laterality: Right;  . COLONOSCOPY    . FISTULA  SUPERFICIALIZATION Right 10/25/2014   Procedure: RIGHT ARM RADIOCEPHALIC FISTULA SUPERFICIALIZATION;  Surgeon: Pryor Ochoa, MD;  Location: [facility] OR;  Service: Vascular;  Laterality: Right;  . INSERTION OF DIALYSIS CATHETER  12/15    Prior to Admission medications   Medication Sig Start Date End Date Taking? Authorizing Provider  acetaminophen (TYLENOL) 325 MG tablet Take 650 mg by mouth every 6 (six) hours as needed for mild pain or moderate pain.    Historical Provider, MD  amiodarone (PACERONE) 200 MG tablet Take 1 tablet (200 mg total) by mouth daily. 08/23/14   Amy D Filbert Schilder, NP  aspirin EC 81 MG EC tablet Take 1 tablet (81 mg total) by mouth daily. 05/05/16   Costin Otelia Sergeant, MD  atorvastatin (LIPITOR) 20 MG tablet Take 20 mg by mouth at bedtime.    Historical Provider, MD  collagenase (SANTYL) ointment Apply topically daily. 07/30/16   Ramonita Lab, MD  feeding supplement, ENSURE ENLIVE, (ENSURE ENLIVE) LIQD Take 237 mLs by mouth 2 (two) times daily between meals. 07/20/16   Katha Hamming, MD  feeding supplement, ENSURE ENLIVE, (ENSURE ENLIVE) LIQD Take 237 mLs by mouth 3 (three) times daily between meals. 07/28/16   Ramonita Lab, MD  feeding supplement, ENSURE ENLIVE, (ENSURE ENLIVE) LIQD Take 237 mLs by mouth 4 (four) times daily. 07/30/16   Ramonita Lab, MD  guaiFENesin-codeine (ROBITUSSIN AC) 100-10 MG/5ML syrup Take 5 mLs by mouth every 6 (six) hours as needed for cough.    Historical Provider, MD  hydrocortisone cream 1 % Apply 1 application topically 3 (three) times daily.    Historical Provider, MD  insulin aspart (NOVOLOG) 100 UNIT/ML injection Inject 0-9 Units into the skin 3 (three) times daily with meals. 07/20/16   Katha Hamming, MD  LORazepam (ATIVAN) 0.5 MG tablet Take 1 tablet (0.5 mg total) by mouth 2 (two) times daily as needed for anxiety (agitation). 07/28/16   Ramonita Lab, MD  Melatonin 3 MG TABS Take 3 mg by mouth at bedtime.    Historical Provider, MD  midodrine  (PROAMATINE) 10 MG tablet Take 1 tablet (10 mg total) by mouth 3 (three) times daily with meals. 07/29/16   Ramonita Lab, MD  nutrition supplement, JUVEN, (JUVEN) PACK Take 1 packet by mouth 2 (two) times daily between meals. 07/30/16   Ramonita Lab, MD  sevelamer carbonate (RENVELA) 800 MG tablet Take 3 tablets (2,400 mg total) by mouth 3 (three) times daily with meals. 02/22/16   Almon Hercules, MD  warfarin (COUMADIN) 2 MG tablet Take 1 tablet (2 mg total) by mouth daily. 07/28/16   Ramonita Lab, MD    Allergies Patient has no known allergies.  Family History  Problem Relation Age of Onset  . Hypertension Mother     Social History Social History  Substance Use Topics  . Smoking status: Never Smoker  . Smokeless tobacco: Never Used  . Alcohol use No     Comment: last usuage 1 yr. ago    Review of  Systems Constitutional: No fever/chills Eyes: No visual changes. ENT: No sore throat. Cardiovascular: Denies chest pain. Respiratory: Denies shortness of breath. Gastrointestinal: No abdominal pain.  No nausea, no vomiting.  No diarrhea.  No constipation. Genitourinary: Negative for dysuria. Musculoskeletal: left hip pain Skin: Negative for rash. Neurological: Negative for headaches, focal weakness or numbness.  10-point ROS otherwise negative.  ____________________________________________   PHYSICAL EXAM:  VITAL SIGNS: ED Triage Vitals  Enc Vitals Group     BP 08/10/16 0122 (!) 80/59     Pulse Rate 08/10/16 0122 (!) 101     Resp 08/10/16 0122 (!) 22     Temp 08/10/16 0122 97.4 F (36.3 C)     Temp Source 08/10/16 0122 Axillary     SpO2 08/10/16 0122 (!) 85 %     Weight 08/10/16 0123 260 lb (117.9 kg)     Height 08/10/16 0123 6' (1.829 m)     Head Circumference --      Peak Flow --      Pain Score 08/10/16 0123 10     Pain Loc --      Pain Edu? --      Excl. in GC? --     Constitutional: Alert and oriented. Well appearing and in moderate to severe distress. Eyes:  Conjunctivae are normal. PERRL. EOMI. Head: Atraumatic. Nose: No congestion/rhinnorhea. Mouth/Throat: Mucous membranes are moist.  Oropharynx non-erythematous. Neck: No cervical spine tenderness to palpation. Cardiovascular: Normal rate, regular rhythm. Grossly normal heart sounds.  Good peripheral circulation. Respiratory: Normal respiratory effort.  No retractions. Lungs CTAB. Gastrointestinal: Soft and nontender, distended abdomen. Positive bowel sounds. Musculoskeletal: Pain to palpation of the left hip with a sort and an internally rotated left hip.   Neurologic:  Normal speech and language. No gross focal neurologic deficits are appreciated. No gait instability. Skin:  Skin is warm, dry and intact. Decubitus ulcer to the patient's sacrum Psychiatric: Mood and affect are normal. Speech and behavior are normal.  ____________________________________________   LABS (all labs ordered are listed, but only abnormal results are displayed)  Labs Reviewed  APTT - Abnormal; Notable for the following:       Result Value   aPTT 60 (*)    All other components within normal limits  PROTIME-INR - Abnormal; Notable for the following:    Prothrombin Time 33.5 (*)    All other components within normal limits  COMPREHENSIVE METABOLIC PANEL - Abnormal; Notable for the following:    Sodium 132 (*)    Chloride 91 (*)    Glucose, Bld 145 (*)    BUN 36 (*)    Creatinine, Ser 5.83 (*)    Total Protein 8.6 (*)    Albumin 2.5 (*)    Total Bilirubin 7.3 (*)    GFR calc non Af Amer 9 (*)    GFR calc Af Amer 10 (*)    All other components within normal limits  CBC WITH DIFFERENTIAL/PLATELET - Abnormal; Notable for the following:    RBC 4.17 (*)    Hemoglobin 12.0 (*)    HCT 35.3 (*)    RDW 20.5 (*)    Neutro Abs 6.9 (*)    Lymphs Abs 0.6 (*)    Monocytes Absolute 1.2 (*)    All other components within normal limits  TYPE AND SCREEN  PREPARE FRESH FROZEN PLASMA  PREPARE RBC (CROSSMATCH)    ____________________________________________  EKG  ED ECG REPORT I, Rebecka Apley, the attending physician, personally viewed and  interpreted this ECG.   Date: 08/10/2016  EKG Time: 133  Rate: 91  Rhythm: normal sinus rhythm  Axis: normal  Intervals:low voltage EKG with diffuse flattening  ST&T Change: none  ____________________________________________  RADIOLOGY  CT head and cervical spine DG left hip CXR ____________________________________________   PROCEDURES  Procedure(s) performed: None  Procedures  Critical Care performed: Yes, see critical care note(s)   CRITICAL CARE Performed by: Lucrezia Europe P   Total critical care time: 60 minutes  Critical care time was exclusive of separately billable procedures and treating other patients.  Critical care was necessary to treat or prevent imminent or life-threatening deterioration.  Critical care was time spent personally by me on the following activities: development of treatment plan with patient and/or surrogate as well as nursing, discussions with consultants, evaluation of patient's response to treatment, examination of patient, obtaining history from patient or surrogate, ordering and performing treatments and interventions, ordering and review of laboratory studies, ordering and review of radiographic studies, pulse oximetry and re-evaluation of patient's condition.   ____________________________________________   INITIAL IMPRESSION / ASSESSMENT AND PLAN / ED COURSE  Pertinent labs & imaging results that were available during my care of the patient were reviewed by me and considered in my medical decision making (see chart for details).  This is a 66 year old male who comes into the hospital today after a fall at his nursing home. The patient states that it was a mechanical fall as he was helping someone up. I did send the patient for a CT scan of his head since he is on Coumadin and I also  sent the patient for an x-ray of his hip as he was having hip pain. The patient's hip is broken but I did receive a phone call from the radiologist stating that the patient had a 4 mm right parafalcine subdural hematoma. The patient came in hypotensive so I did initially give him 750 ML's of normal saline. The patient is a dialysis patient so I did attempt to be careful with his fluid status. Once I noticed that he had these fractures and this head bleed I decided to give the patient some emergent blood. I will also give the patient some vitamin K subcutaneous as well as some FFP to help reverse his INR. I contacted [facility] given the patient's head bleed and the patient was accepted to the ER through the trauma service. I will give him fentanyl 25 g to help with his pain but I will continue to keep an eye on his blood pressure. His initial blood pressure was 80 systolic and has been in the 70s to 80s.  Clinical Course as of Aug 10 328  Mon Aug 10, 2016  0226 CT HEAD: 4 mm RIGHT parafalcine subdural hematoma, likely acute.  Moderate to severe global brain atrophy. Old cerebellar infarcts.  CT CERVICAL SPINE: Degenerative cervical spine without acute fracture or malalignment.   CT Head Wo Contrast [AW]  0227 Displaced left sub trochanteric fracture with moderate varus angulation   DG Hip Unilat W or Wo Pelvis 2-3 Views Left [AW]  0309 Severe cardiomegaly and mild interstitial edema. Persistent patchy retrocardiac consolidation. Small LEFT pleural effusion.   DG Chest Port 1 View [AW]    Clinical Course User Index [AW] Rebecka Apley, MD     ____________________________________________   FINAL CLINICAL IMPRESSION(S) / ED DIAGNOSES  Final diagnoses:  Subdural hematoma (HCC)  Closed fracture of left hip, initial encounter [facility])  Other specified hypotension  NEW MEDICATIONS STARTED DURING THIS VISIT:  New Prescriptions   No medications on file     Note:  This document  was prepared using Dragon voice recognition software and may include unintentional dictation errors.    Rebecka Apley, MD 08/10/16 [phone]

## 2016-08-11 LAB — TYPE AND SCREEN
ABO/RH(D): A POS
Antibody Screen: NEGATIVE
UNIT DIVISION: 0
UNIT DIVISION: 0
UNIT DIVISION: 0
Unit division: 0

## 2016-08-11 LAB — PREPARE FRESH FROZEN PLASMA: Unit division: 0

## 2016-08-12 LAB — PREPARE RBC (CROSSMATCH)

## 2018-06-07 IMAGING — CR DG CHEST 2V
2 series · 2 of 2 positions shown · non-contrast
Comparison: 05/22/2016

CLINICAL DATA: Shortness of breath. Productive cough for 1-2 days.
History of CHF.

EXAM:
CHEST  2 VIEW

[chest lat]
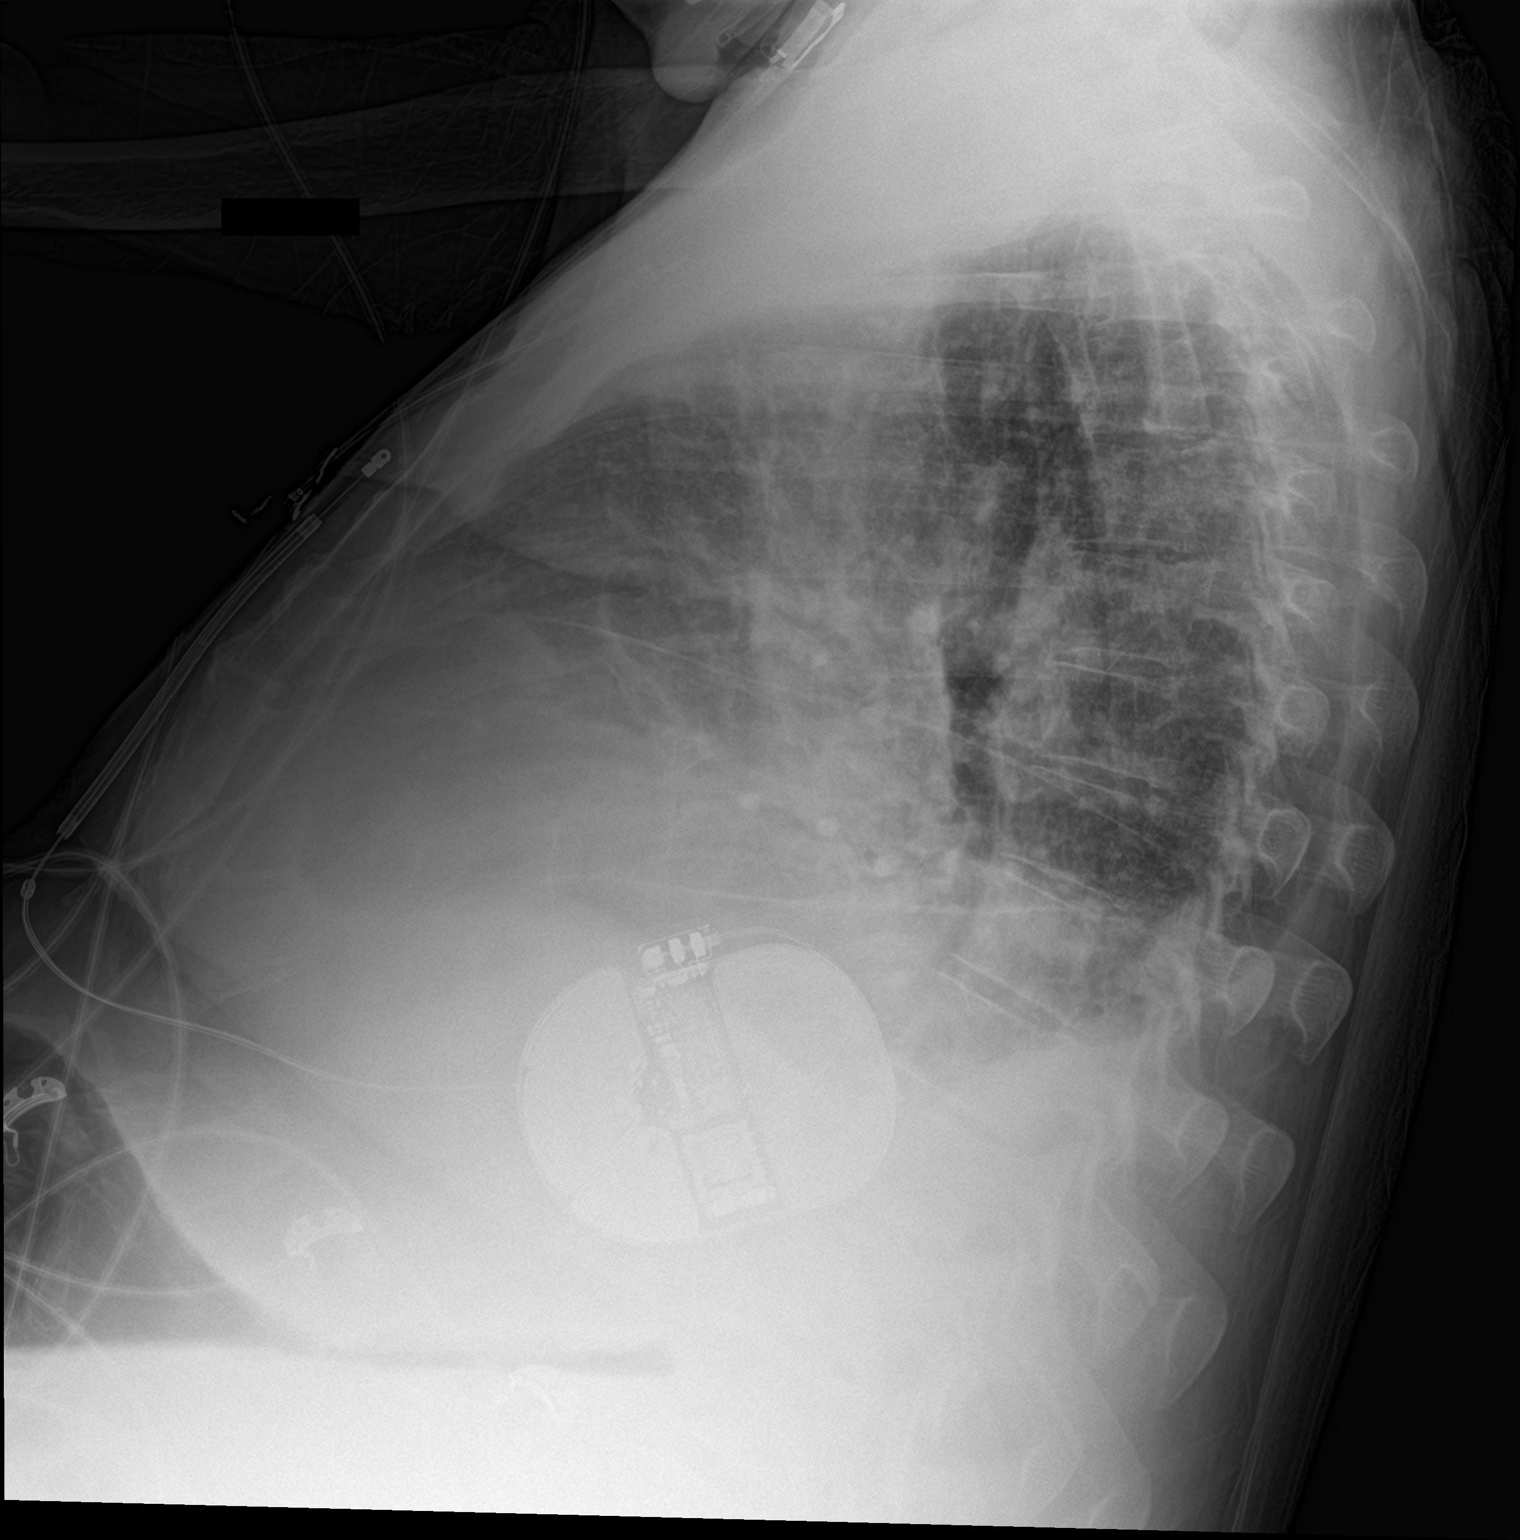

[chest ap]
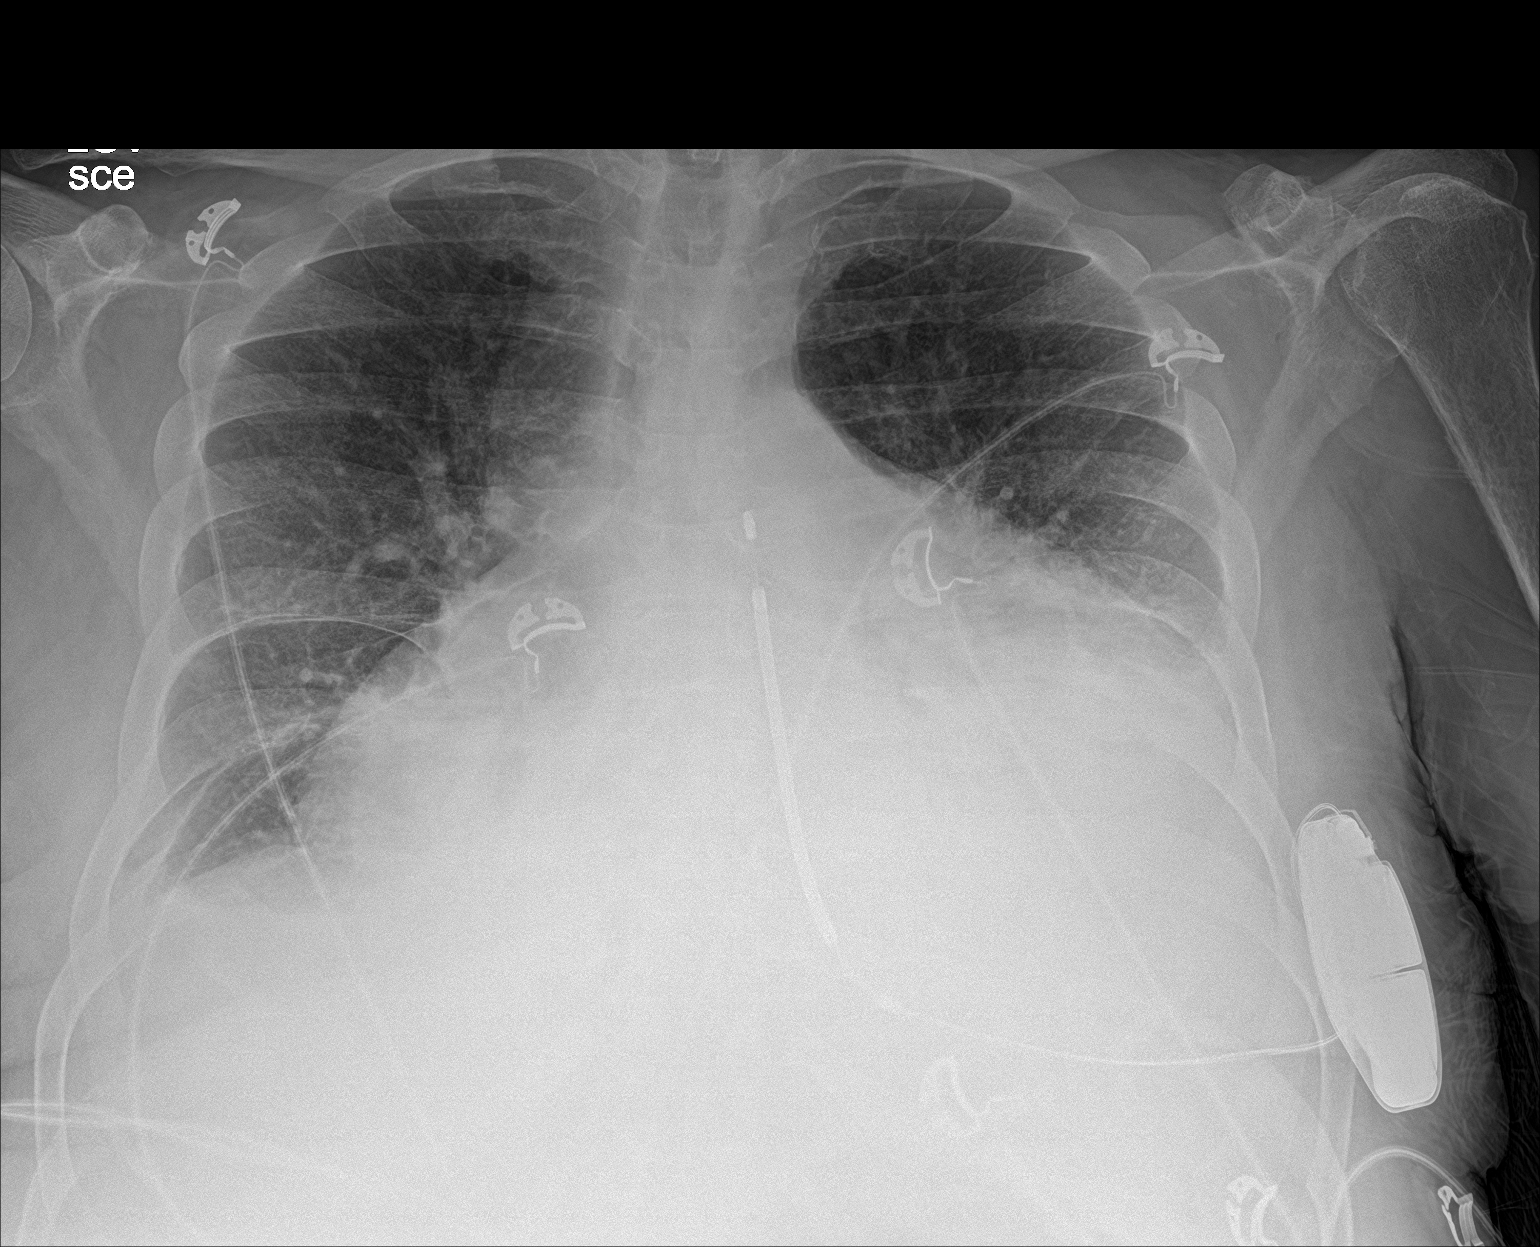

[2 of 2 positions shown; findings below may reference images not displayed]

FINDINGS: Marked enlargement of the cardiac silhouette is unchanged.
Electronic stimulator device remains in place with lead in the
subcutaneous tissues of the anterior chest wall just left of
midline. Mild central pulmonary vascular congestion is similar to
the prior study. Patchy opacities in the lung bases have mildly
increased. There are small bilateral pleural effusions. No
pneumothorax or acute osseous abnormality is seen.
IMPRESSION: 1. Marked cardiomegaly and mild pulmonary vascular congestion.
Underlying pericardial effusion not excluded.
2. Mildly increased bibasilar opacities which may reflect
atelectasis or pneumonia.
3. Small pleural effusions.

## 2018-07-08 IMAGING — DX DG CHEST 1V PORT
1 series · 1 of 1 positions shown · non-contrast
Comparison: 06/12/2016 chest radiograph and 06/10/2014.

CLINICAL DATA: 65 y/o  M; shortness of breath.

EXAM:
PORTABLE CHEST 1 VIEW

[chest ap]
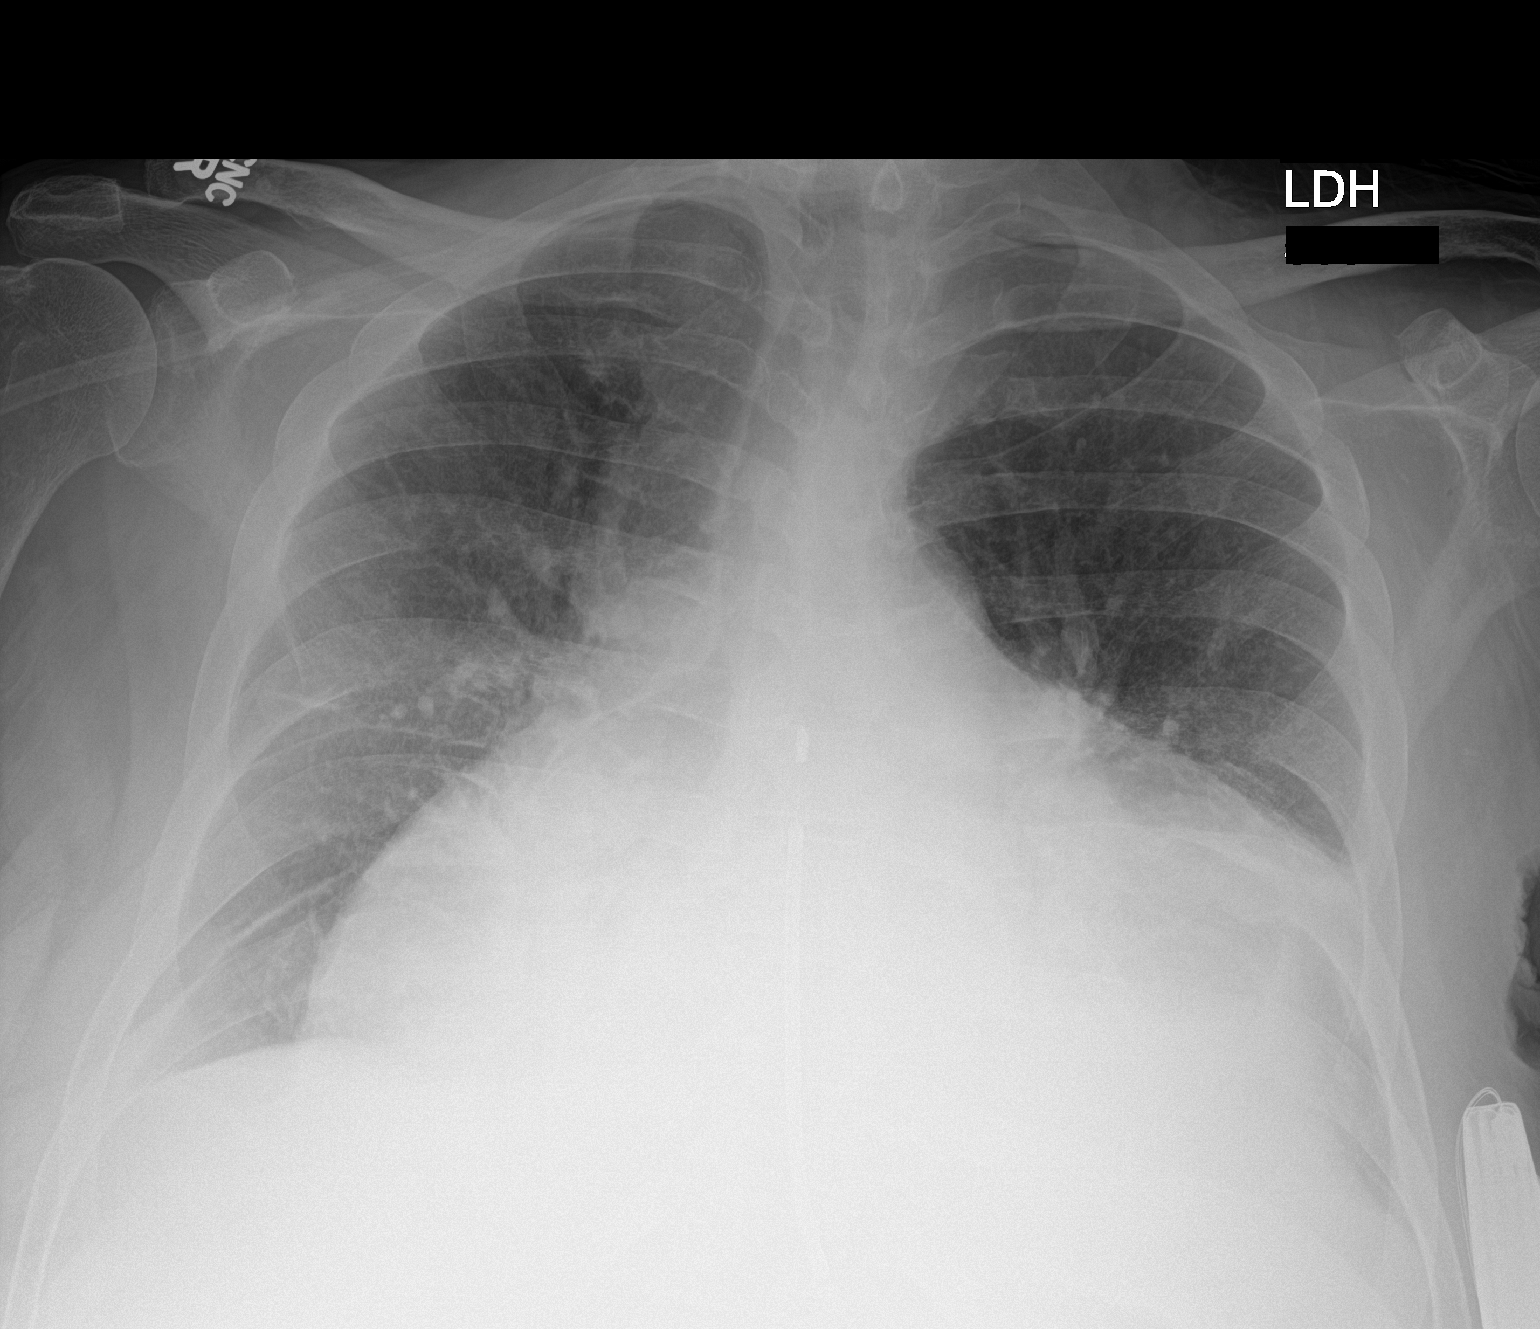

[1 of 1 positions shown; findings below may reference images not displayed]

FINDINGS: Severe cardiomegaly much increased in size from 1800, question
pericardial effusion. Small left and trace right pleural effusions.
Associated basilar opacities may represent atelectasis or pneumonia.
No significant interval change. Bones are unremarkable. AICD lead
noted.
IMPRESSION: 1. Severe cardiomegaly much increased in size from 1800, question
pericardial effusion.
2. Small left and trace right pleural effusions with associated
basilar opacities are stable and may represent atelectasis or
pneumonia.

By: Letze Sanfona M.D.
